# Patient Record
Sex: Female | Born: 1950
Health system: Southern US, Community
[De-identification: ages and names within clinical notes are randomized; demographics above are authoritative.]

## PROBLEM LIST (undated history)

## (undated) DIAGNOSIS — B029 Zoster without complications: Secondary | ICD-10-CM

## (undated) DIAGNOSIS — K219 Gastro-esophageal reflux disease without esophagitis: Secondary | ICD-10-CM

## (undated) DIAGNOSIS — K648 Other hemorrhoids: Secondary | ICD-10-CM

## (undated) DIAGNOSIS — N9489 Other specified conditions associated with female genital organs and menstrual cycle: Secondary | ICD-10-CM

## (undated) DIAGNOSIS — E7800 Pure hypercholesterolemia, unspecified: Secondary | ICD-10-CM

## (undated) DIAGNOSIS — C649 Malignant neoplasm of unspecified kidney, except renal pelvis: Secondary | ICD-10-CM

## (undated) DIAGNOSIS — I10 Essential (primary) hypertension: Secondary | ICD-10-CM

## (undated) HISTORY — PX: LIPECTOMY: SHX11

---

## 1997-09-21 ENCOUNTER — Ambulatory Visit (HOSPITAL_COMMUNITY): Admission: RE | Admit: 1997-09-21 | Discharge: 1997-09-21 | Payer: Self-pay | Admitting: *Deleted

## 1998-05-28 ENCOUNTER — Other Ambulatory Visit: Admission: RE | Admit: 1998-05-28 | Discharge: 1998-05-28 | Payer: Self-pay | Admitting: Obstetrics and Gynecology

## 1998-07-06 ENCOUNTER — Other Ambulatory Visit: Admission: RE | Admit: 1998-07-06 | Discharge: 1998-07-06 | Payer: Self-pay | Admitting: Obstetrics and Gynecology

## 1999-07-15 ENCOUNTER — Encounter: Admission: RE | Admit: 1999-07-15 | Discharge: 1999-07-15 | Payer: Self-pay | Admitting: Obstetrics and Gynecology

## 1999-07-15 ENCOUNTER — Encounter: Payer: Self-pay | Admitting: Obstetrics and Gynecology

## 1999-07-19 ENCOUNTER — Other Ambulatory Visit: Admission: RE | Admit: 1999-07-19 | Discharge: 1999-07-19 | Payer: Self-pay | Admitting: Obstetrics and Gynecology

## 2000-07-17 ENCOUNTER — Encounter: Payer: Self-pay | Admitting: Obstetrics and Gynecology

## 2000-07-17 ENCOUNTER — Encounter: Admission: RE | Admit: 2000-07-17 | Discharge: 2000-07-17 | Payer: Self-pay | Admitting: Obstetrics and Gynecology

## 2000-08-19 ENCOUNTER — Other Ambulatory Visit: Admission: RE | Admit: 2000-08-19 | Discharge: 2000-08-19 | Payer: Self-pay | Admitting: Obstetrics and Gynecology

## 2001-01-25 ENCOUNTER — Ambulatory Visit (HOSPITAL_COMMUNITY): Admission: RE | Admit: 2001-01-25 | Discharge: 2001-01-25 | Payer: Self-pay | Admitting: *Deleted

## 2002-12-21 ENCOUNTER — Ambulatory Visit (HOSPITAL_COMMUNITY): Admission: RE | Admit: 2002-12-21 | Discharge: 2002-12-21 | Payer: Self-pay | Admitting: Endocrinology

## 2003-01-09 ENCOUNTER — Encounter: Admission: RE | Admit: 2003-01-09 | Discharge: 2003-01-09 | Payer: Self-pay | Admitting: Obstetrics and Gynecology

## 2003-02-14 ENCOUNTER — Other Ambulatory Visit: Admission: RE | Admit: 2003-02-14 | Discharge: 2003-02-14 | Payer: Self-pay | Admitting: Obstetrics and Gynecology

## 2003-12-12 ENCOUNTER — Ambulatory Visit: Payer: Self-pay | Admitting: Cardiology

## 2003-12-13 ENCOUNTER — Encounter: Payer: Self-pay | Admitting: Cardiology

## 2003-12-13 ENCOUNTER — Ambulatory Visit (HOSPITAL_COMMUNITY): Admission: RE | Admit: 2003-12-13 | Discharge: 2003-12-13 | Payer: Self-pay | Admitting: Neurology

## 2004-04-10 ENCOUNTER — Emergency Department (HOSPITAL_COMMUNITY): Admission: EM | Admit: 2004-04-10 | Discharge: 2004-04-10 | Payer: Self-pay | Admitting: Emergency Medicine

## 2004-10-28 ENCOUNTER — Encounter: Admission: RE | Admit: 2004-10-28 | Discharge: 2004-10-28 | Payer: Self-pay | Admitting: Surgery

## 2004-11-15 ENCOUNTER — Encounter: Admission: RE | Admit: 2004-11-15 | Discharge: 2004-11-15 | Payer: Self-pay | Admitting: Internal Medicine

## 2005-03-31 ENCOUNTER — Encounter: Admission: RE | Admit: 2005-03-31 | Discharge: 2005-03-31 | Payer: Self-pay | Admitting: Obstetrics and Gynecology

## 2005-05-16 ENCOUNTER — Encounter: Admission: RE | Admit: 2005-05-16 | Discharge: 2005-05-16 | Payer: Self-pay | Admitting: Internal Medicine

## 2005-07-01 ENCOUNTER — Ambulatory Visit (HOSPITAL_COMMUNITY): Admission: RE | Admit: 2005-07-01 | Discharge: 2005-07-01 | Payer: Self-pay | Admitting: Obstetrics and Gynecology

## 2005-07-01 ENCOUNTER — Encounter (INDEPENDENT_AMBULATORY_CARE_PROVIDER_SITE_OTHER): Payer: Self-pay | Admitting: Specialist

## 2005-07-01 HISTORY — PX: OTHER SURGICAL HISTORY: SHX169

## 2006-04-03 ENCOUNTER — Encounter: Admission: RE | Admit: 2006-04-03 | Discharge: 2006-04-03 | Payer: Self-pay | Admitting: Obstetrics and Gynecology

## 2006-06-24 ENCOUNTER — Encounter: Admission: RE | Admit: 2006-06-24 | Discharge: 2006-06-24 | Payer: Self-pay | Admitting: Internal Medicine

## 2007-05-24 ENCOUNTER — Encounter: Admission: RE | Admit: 2007-05-24 | Discharge: 2007-05-24 | Payer: Self-pay | Admitting: Obstetrics and Gynecology

## 2008-01-14 ENCOUNTER — Encounter: Admission: RE | Admit: 2008-01-14 | Discharge: 2008-01-14 | Payer: Self-pay | Admitting: Internal Medicine

## 2008-06-20 ENCOUNTER — Encounter: Admission: RE | Admit: 2008-06-20 | Discharge: 2008-06-20 | Payer: Self-pay | Admitting: Obstetrics and Gynecology

## 2008-10-25 ENCOUNTER — Encounter: Admission: RE | Admit: 2008-10-25 | Discharge: 2008-10-25 | Payer: Self-pay | Admitting: Internal Medicine

## 2008-11-13 ENCOUNTER — Ambulatory Visit (HOSPITAL_COMMUNITY): Admission: RE | Admit: 2008-11-13 | Discharge: 2008-11-14 | Payer: Self-pay | Admitting: General Surgery

## 2008-11-13 ENCOUNTER — Encounter (INDEPENDENT_AMBULATORY_CARE_PROVIDER_SITE_OTHER): Payer: Self-pay | Admitting: General Surgery

## 2008-11-13 HISTORY — PX: CHOLECYSTECTOMY: SHX55

## 2009-03-26 ENCOUNTER — Encounter: Admission: RE | Admit: 2009-03-26 | Discharge: 2009-03-26 | Payer: Self-pay | Admitting: Internal Medicine

## 2009-04-24 ENCOUNTER — Ambulatory Visit (HOSPITAL_COMMUNITY): Admission: RE | Admit: 2009-04-24 | Discharge: 2009-04-24 | Payer: Self-pay | Admitting: Urology

## 2009-05-01 ENCOUNTER — Ambulatory Visit (HOSPITAL_COMMUNITY): Admission: RE | Admit: 2009-05-01 | Discharge: 2009-05-01 | Payer: Self-pay | Admitting: Urology

## 2009-05-14 ENCOUNTER — Inpatient Hospital Stay (HOSPITAL_COMMUNITY): Admission: RE | Admit: 2009-05-14 | Discharge: 2009-05-16 | Payer: Self-pay | Admitting: Urology

## 2009-05-14 ENCOUNTER — Encounter (INDEPENDENT_AMBULATORY_CARE_PROVIDER_SITE_OTHER): Payer: Self-pay | Admitting: Urology

## 2009-05-14 DIAGNOSIS — C649 Malignant neoplasm of unspecified kidney, except renal pelvis: Secondary | ICD-10-CM

## 2009-05-14 HISTORY — PX: CYSTOSCOPY KIDNEY W/ URETERAL GUIDE WIRE: SUR371

## 2009-05-14 HISTORY — DX: Malignant neoplasm of unspecified kidney, except renal pelvis: C64.9

## 2009-07-23 ENCOUNTER — Encounter: Admission: RE | Admit: 2009-07-23 | Discharge: 2009-07-23 | Payer: Self-pay | Admitting: Obstetrics and Gynecology

## 2010-02-17 ENCOUNTER — Encounter: Payer: Self-pay | Admitting: Obstetrics and Gynecology

## 2010-02-18 ENCOUNTER — Encounter: Payer: Self-pay | Admitting: Internal Medicine

## 2010-02-22 ENCOUNTER — Ambulatory Visit (HOSPITAL_COMMUNITY)
Admission: RE | Admit: 2010-02-22 | Discharge: 2010-02-22 | Payer: Self-pay | Source: Home / Self Care | Attending: Urology | Admitting: Urology

## 2010-04-16 LAB — CBC
HCT: 33.1 % — ABNORMAL LOW (ref 36.0–46.0)
MCV: 87.1 fL (ref 78.0–100.0)
RBC: 3.81 MIL/uL — ABNORMAL LOW (ref 3.87–5.11)
WBC: 6.6 10*3/uL (ref 4.0–10.5)

## 2010-04-16 LAB — BASIC METABOLIC PANEL
BUN: 13 mg/dL (ref 6–23)
CO2: 23 mEq/L (ref 19–32)
CO2: 26 mEq/L (ref 19–32)
CO2: 28 mEq/L (ref 19–32)
Calcium: 9.2 mg/dL (ref 8.4–10.5)
Calcium: 9.7 mg/dL (ref 8.4–10.5)
Chloride: 103 mEq/L (ref 96–112)
Chloride: 103 mEq/L (ref 96–112)
Chloride: 107 mEq/L (ref 96–112)
Creatinine, Ser: 1.01 mg/dL (ref 0.4–1.2)
Creatinine, Ser: 1.07 mg/dL (ref 0.4–1.2)
GFR calc Af Amer: >60 mL/min (ref >60)
GFR calc Af Amer: >60 mL/min (ref >60)
GFR calc non Af Amer: 56 mL/min — ABNORMAL LOW (ref >60)
Glucose, Bld: 119 mg/dL — ABNORMAL HIGH (ref 70–99)
Glucose, Bld: 224 mg/dL — ABNORMAL HIGH (ref 70–99)
Potassium: 4.1 mEq/L (ref 3.5–5.1)
Potassium: 4.4 mEq/L (ref 3.5–5.1)
Potassium: 4.4 mEq/L (ref 3.5–5.1)
Sodium: 135 mEq/L (ref 135–145)
Sodium: 135 mEq/L (ref 135–145)
Sodium: 140 mEq/L (ref 135–145)

## 2010-04-16 LAB — GLUCOSE, CAPILLARY
Glucose-Capillary: 180 mg/dL — ABNORMAL HIGH (ref 70–99)
Glucose-Capillary: 184 mg/dL — ABNORMAL HIGH (ref 70–99)
Glucose-Capillary: 192 mg/dL — ABNORMAL HIGH (ref 70–99)
Glucose-Capillary: 200 mg/dL — ABNORMAL HIGH (ref 70–99)
Glucose-Capillary: 227 mg/dL — ABNORMAL HIGH (ref 70–99)
Glucose-Capillary: 311 mg/dL — ABNORMAL HIGH (ref 70–99)

## 2010-04-16 LAB — HEMOGLOBIN AND HEMATOCRIT, BLOOD
HCT: 29.6 % — ABNORMAL LOW (ref 36.0–46.0)
HCT: 29.9 % — ABNORMAL LOW (ref 36.0–46.0)
Hemoglobin: 10 g/dL — ABNORMAL LOW (ref 12.0–15.0)
Hemoglobin: 9.7 g/dL — ABNORMAL LOW (ref 12.0–15.0)

## 2010-04-16 LAB — CREATININE, FLUID (PLEURAL, PERITONEAL, JP DRAINAGE)

## 2010-05-02 LAB — BASIC METABOLIC PANEL
BUN: 22 mg/dL (ref 6–23)
CO2: 27 mEq/L (ref 19–32)
Chloride: 100 mEq/L (ref 96–112)
Creatinine, Ser: 0.95 mg/dL (ref 0.4–1.2)

## 2010-05-02 LAB — CBC
MCHC: 33.7 g/dL (ref 30.0–36.0)
MCV: 85 fL (ref 78.0–100.0)
Platelets: 328 10*3/uL (ref 150–400)

## 2010-05-02 LAB — GLUCOSE, CAPILLARY
Glucose-Capillary: 123 mg/dL — ABNORMAL HIGH (ref 70–99)
Glucose-Capillary: 126 mg/dL — ABNORMAL HIGH (ref 70–99)
Glucose-Capillary: 161 mg/dL — ABNORMAL HIGH (ref 70–99)
Glucose-Capillary: 247 mg/dL — ABNORMAL HIGH (ref 70–99)

## 2010-06-14 NOTE — Op Note (Signed)
[facility]  Patient:    Briana Garrison, Briana Garrison Visit Number: [id number] MRN: [id number]          Service Type: END Location: ENDO Attending Physician:  Jim Desanctis Dictated by:   Jim Desanctis, M.D. Proc. Date: 01/25/01 Admit Date:  01/25/2001                             Operative Report  PROCEDURE:  Colonoscopy.  INDICATION:  Rectal bleeding.  ANESTHESIA:  Demerol 60 mg, Versed 6 mg IV.  DESCRIPTION OF PROCEDURE:  With the patient mildly sedated in the left lateral decubitus position, the Olympus videoscopic colonoscope was inserted in the rectum, passed under direct vision to the cecum, identified by ileocecal valve and appendiceal orifice. From this point, the colonoscope was slowly withdrawn taking circumferential views of the entire colonic mucosa stopping in the rectum which appeared normal in direct view, showed hemorrhoids in retroflex view. The endoscope was straightened and withdrawn. The patients vital signs, pulse oximeter remained stable. The patient tolerated the procedure well without apparent complications.  FINDINGS:  Internal hemorrhoids, otherwise unremarkable examination.  PLAN:  Repeat examination in five to ten years. Dictated by:   Jim Desanctis, M.D. Attending Physician:  Jim Desanctis DD:  01/25/01 TD:  01/25/01 Job: [id number]

## 2010-06-14 NOTE — Op Note (Signed)
NAMEOLOLADE, LAZARRE               ACCOUNT NO.:  [id number]   MEDICAL RECORD NO.:  [id number]          PATIENT TYPE:  AMB   LOCATION:  [place]                           FACILITY:  [facility]   PHYSICIAN:  Servando Salina, M.D.DATE OF BIRTH:  03/09/1950   DATE OF PROCEDURE:  DATE OF DISCHARGE:                                 OPERATIVE REPORT   PREOPERATIVE DIAGNOSIS:  Endometrial mass.   PROCEDURES:  Diagnostic hysteroscopy, D&C, endometrial polypectomy.   POSTOPERATIVE DIAGNOSIS:  Endometrial mass pending final pathology.   ANESTHESIA:  General paracervical block.   SURGEON:  Servando Salina, M.D.   PROCEDURE:  Under adequate general anesthesia, the patient is placed in the  dorsal lithotomy position.  She was sterilely prepped and draped in usual  fashion.  Bladder was catheterized of moderate amount of urine.  Examination  under anesthesia revealed an axial to anteverted uterus, no adnexal masses  could be appreciated.  A bivalve speculum was placed in the vagina.  Atrophic vaginal mucosa was noted.  10 mL of 1% nesacaine was injected  paracervically at 3 and 9 o'clock.  The anterior lip of the cervix was  grasped with a single-tooth tenaculum.  The cervix was then serially dilated  to #25 [facility] dilator.  A diagnostic hysteroscope was introduced into uterine  cavity. Polypoid lesions noted off the posterior wall.  Both tubal ostia  could be seen.  No masses in the endocervical canal noted.  The hysteroscope  was removed.  A polyp forcep was then used to grab the polyp which was then  removed.  The hysteroscope was then reinserted, confirmed that the polyp was  removed in its entirety.  The cavity was then curetted for scant amount of  tissue.  The hysteroscope was then reinserted in the uterus for further  inspection, no other lesions were noted, at which time all instruments were  then removed from the vagina.  Specimen labeled endometrial curettings and  endometrial polyp were  sent to pathology.  Estimated blood loss was minimal.  Fluid deficit was 75 mL.  Complication was none.  The patient tolerated the  procedure well, was transferred to recovery in stable condition.      Servando Salina, M.D.  Electronically Signed     [place]/MEDQ  D:  07/01/2005  T:  07/02/2005  Job:  [id number]

## 2010-06-21 ENCOUNTER — Other Ambulatory Visit: Payer: Self-pay | Admitting: Obstetrics and Gynecology

## 2010-06-21 DIAGNOSIS — Z1231 Encounter for screening mammogram for malignant neoplasm of breast: Secondary | ICD-10-CM

## 2010-07-04 ENCOUNTER — Other Ambulatory Visit (HOSPITAL_COMMUNITY): Payer: Self-pay | Admitting: Urology

## 2010-07-04 ENCOUNTER — Ambulatory Visit (HOSPITAL_COMMUNITY)
Admission: RE | Admit: 2010-07-04 | Discharge: 2010-07-04 | Disposition: A | Discharge status: Home / Self Care | Payer: BC Managed Care – PPO | Source: Ambulatory Visit | Attending: Urology | Admitting: Urology

## 2010-07-04 DIAGNOSIS — Z85528 Personal history of other malignant neoplasm of kidney: Secondary | ICD-10-CM | POA: Insufficient documentation

## 2010-07-04 DIAGNOSIS — I10 Essential (primary) hypertension: Secondary | ICD-10-CM | POA: Insufficient documentation

## 2010-07-25 ENCOUNTER — Ambulatory Visit: Payer: Self-pay

## 2010-07-26 ENCOUNTER — Ambulatory Visit
Admission: RE | Admit: 2010-07-26 | Discharge: 2010-07-26 | Disposition: A | Discharge status: Home / Self Care | Payer: BC Managed Care – PPO | Source: Ambulatory Visit | Attending: Obstetrics and Gynecology | Admitting: Obstetrics and Gynecology

## 2010-07-26 DIAGNOSIS — Z1231 Encounter for screening mammogram for malignant neoplasm of breast: Secondary | ICD-10-CM

## 2011-05-12 ENCOUNTER — Other Ambulatory Visit: Payer: Self-pay | Admitting: Obstetrics and Gynecology

## 2011-05-12 DIAGNOSIS — N644 Mastodynia: Secondary | ICD-10-CM

## 2011-05-13 ENCOUNTER — Ambulatory Visit
Admission: RE | Admit: 2011-05-13 | Discharge: 2011-05-13 | Disposition: A | Discharge status: Home / Self Care | Payer: BC Managed Care – PPO | Source: Ambulatory Visit | Attending: Obstetrics and Gynecology | Admitting: Obstetrics and Gynecology

## 2011-05-13 DIAGNOSIS — N644 Mastodynia: Secondary | ICD-10-CM

## 2011-06-19 ENCOUNTER — Other Ambulatory Visit: Payer: Self-pay | Admitting: Internal Medicine

## 2011-06-19 DIAGNOSIS — Z1231 Encounter for screening mammogram for malignant neoplasm of breast: Secondary | ICD-10-CM

## 2011-06-23 ENCOUNTER — Encounter (HOSPITAL_COMMUNITY): Payer: Self-pay

## 2011-06-23 ENCOUNTER — Inpatient Hospital Stay (HOSPITAL_COMMUNITY)
Admission: EM | Admit: 2011-06-23 | Discharge: 2011-06-28 | DRG: 316 | Disposition: A | Discharge status: Home / Self Care | Payer: BC Managed Care – PPO | Attending: Internal Medicine | Admitting: Internal Medicine

## 2011-06-23 DIAGNOSIS — N179 Acute kidney failure, unspecified: Secondary | ICD-10-CM

## 2011-06-23 DIAGNOSIS — N189 Chronic kidney disease, unspecified: Secondary | ICD-10-CM | POA: Diagnosis present

## 2011-06-23 DIAGNOSIS — R410 Disorientation, unspecified: Secondary | ICD-10-CM

## 2011-06-23 DIAGNOSIS — R4182 Altered mental status, unspecified: Secondary | ICD-10-CM | POA: Diagnosis present

## 2011-06-23 DIAGNOSIS — Z794 Long term (current) use of insulin: Secondary | ICD-10-CM

## 2011-06-23 DIAGNOSIS — E860 Dehydration: Secondary | ICD-10-CM

## 2011-06-23 DIAGNOSIS — K219 Gastro-esophageal reflux disease without esophagitis: Secondary | ICD-10-CM | POA: Diagnosis present

## 2011-06-23 DIAGNOSIS — E1169 Type 2 diabetes mellitus with other specified complication: Secondary | ICD-10-CM

## 2011-06-23 DIAGNOSIS — R1110 Vomiting, unspecified: Secondary | ICD-10-CM | POA: Diagnosis present

## 2011-06-23 DIAGNOSIS — N390 Urinary tract infection, site not specified: Secondary | ICD-10-CM | POA: Diagnosis present

## 2011-06-23 DIAGNOSIS — R1084 Generalized abdominal pain: Secondary | ICD-10-CM

## 2011-06-23 DIAGNOSIS — E119 Type 2 diabetes mellitus without complications: Secondary | ICD-10-CM | POA: Diagnosis present

## 2011-06-23 DIAGNOSIS — D649 Anemia, unspecified: Secondary | ICD-10-CM | POA: Diagnosis present

## 2011-06-23 DIAGNOSIS — E11649 Type 2 diabetes mellitus with hypoglycemia without coma: Secondary | ICD-10-CM | POA: Diagnosis present

## 2011-06-23 DIAGNOSIS — I10 Essential (primary) hypertension: Secondary | ICD-10-CM | POA: Diagnosis present

## 2011-06-23 DIAGNOSIS — R5381 Other malaise: Secondary | ICD-10-CM | POA: Diagnosis present

## 2011-06-23 DIAGNOSIS — E7800 Pure hypercholesterolemia, unspecified: Secondary | ICD-10-CM | POA: Diagnosis present

## 2011-06-23 DIAGNOSIS — E785 Hyperlipidemia, unspecified: Secondary | ICD-10-CM | POA: Diagnosis present

## 2011-06-23 DIAGNOSIS — E162 Hypoglycemia, unspecified: Secondary | ICD-10-CM

## 2011-06-23 DIAGNOSIS — E782 Mixed hyperlipidemia: Secondary | ICD-10-CM

## 2011-06-23 DIAGNOSIS — N289 Disorder of kidney and ureter, unspecified: Secondary | ICD-10-CM

## 2011-06-23 HISTORY — DX: Gastro-esophageal reflux disease without esophagitis: K21.9

## 2011-06-23 HISTORY — DX: Zoster without complications: B02.9

## 2011-06-23 HISTORY — DX: Other hemorrhoids: K64.8

## 2011-06-23 HISTORY — DX: Other specified conditions associated with female genital organs and menstrual cycle: N94.89

## 2011-06-23 HISTORY — DX: Essential (primary) hypertension: I10

## 2011-06-23 HISTORY — DX: Malignant neoplasm of unspecified kidney, except renal pelvis: C64.9

## 2011-06-23 HISTORY — DX: Pure hypercholesterolemia, unspecified: E78.00

## 2011-06-23 LAB — HEPATIC FUNCTION PANEL
ALT: 31 U/L (ref 0–35)
AST: 23 U/L (ref 0–37)
Alkaline Phosphatase: 63 U/L (ref 39–117)
Bilirubin, Direct: <0.1 mg/dL (ref 0.0–0.3)

## 2011-06-23 LAB — HEMOGLOBIN A1C
Hgb A1c MFr Bld: 8.8 % — ABNORMAL HIGH (ref <5.7)
Mean Plasma Glucose: 206 mg/dL — ABNORMAL HIGH (ref <117)

## 2011-06-23 LAB — URINALYSIS, ROUTINE W REFLEX MICROSCOPIC
Glucose, UA: NEGATIVE mg/dL
Ketones, ur: NEGATIVE mg/dL
Protein, ur: 30 mg/dL — AB
Urobilinogen, UA: 0.2 mg/dL (ref 0.0–1.0)

## 2011-06-23 LAB — BASIC METABOLIC PANEL
CO2: 25 mEq/L (ref 19–32)
Calcium: 9.5 mg/dL (ref 8.4–10.5)
Creatinine, Ser: 2.74 mg/dL — ABNORMAL HIGH (ref 0.50–1.10)
GFR calc non Af Amer: 18 mL/min — ABNORMAL LOW (ref >90)
Glucose, Bld: 54 mg/dL — ABNORMAL LOW (ref 70–99)

## 2011-06-23 LAB — CBC
MCH: 28.4 pg (ref 26.0–34.0)
MCV: 85.5 fL (ref 78.0–100.0)
Platelets: 246 10*3/uL (ref 150–400)
RDW: 14.1 % (ref 11.5–15.5)
WBC: 8.9 10*3/uL (ref 4.0–10.5)

## 2011-06-23 LAB — DIFFERENTIAL
Basophils Absolute: 0 10*3/uL (ref 0.0–0.1)
Eosinophils Absolute: 0.2 10*3/uL (ref 0.0–0.7)
Eosinophils Relative: 2 % (ref 0–5)

## 2011-06-23 LAB — GLUCOSE, CAPILLARY
Glucose-Capillary: 179 mg/dL — ABNORMAL HIGH (ref 70–99)
Glucose-Capillary: 274 mg/dL — ABNORMAL HIGH (ref 70–99)
Glucose-Capillary: 58 mg/dL — ABNORMAL LOW (ref 70–99)
Glucose-Capillary: 78 mg/dL (ref 70–99)
Glucose-Capillary: 93 mg/dL (ref 70–99)

## 2011-06-23 LAB — URINE MICROSCOPIC-ADD ON

## 2011-06-23 MED ORDER — INSULIN ASPART 100 UNIT/ML ~~LOC~~ SOLN
0.0000 [IU] | Freq: Three times a day (TID) | SUBCUTANEOUS | Status: DC
  Administered 2011-06-24: 2 [IU] via SUBCUTANEOUS
  Administered 2011-06-24: 1 [IU] via SUBCUTANEOUS
  Administered 2011-06-24: 2 [IU] via SUBCUTANEOUS
  Administered 2011-06-25: 4 [IU] via SUBCUTANEOUS

## 2011-06-23 MED ORDER — ACETAMINOPHEN 650 MG RE SUPP: 650.0000 mg | Freq: Four times a day (QID) | RECTAL | Status: DC | PRN

## 2011-06-23 MED ORDER — SODIUM CHLORIDE 0.9 % IV BOLUS (SEPSIS)
1000.0000 mL | Freq: Once | INTRAVENOUS | Status: AC
  Administered 2011-06-23: 1000 mL via INTRAVENOUS

## 2011-06-23 MED ORDER — VITAMIN B-12 1000 MCG PO TABS
500.0000 ug | ORAL_TABLET | Freq: Every day | ORAL | Status: DC
  Administered 2011-06-23 – 2011-06-28 (×6): 500 ug via ORAL
  Filled 2011-06-23 (×6): qty 0.5

## 2011-06-23 MED ORDER — ONDANSETRON HCL 4 MG/2ML IJ SOLN
4.0000 mg | Freq: Four times a day (QID) | INTRAMUSCULAR | Status: DC | PRN
  Administered 2011-06-24: 4 mg via INTRAVENOUS
  Filled 2011-06-23: qty 2

## 2011-06-23 MED ORDER — ASPIRIN 325 MG PO TABS
325.0000 mg | ORAL_TABLET | Freq: Every day | ORAL | Status: DC
  Administered 2011-06-23 – 2011-06-28 (×6): 325 mg via ORAL
  Filled 2011-06-23 (×6): qty 1

## 2011-06-23 MED ORDER — DEXTROSE-NACL 5-0.9 % IV SOLN
INTRAVENOUS | Status: DC
  Administered 2011-06-23 – 2011-06-24 (×3): via INTRAVENOUS

## 2011-06-23 MED ORDER — OXYCODONE-ACETAMINOPHEN 5-325 MG PO TABS
1.0000 | ORAL_TABLET | ORAL | Status: DC | PRN
  Administered 2011-06-23 – 2011-06-27 (×5): 1 via ORAL
  Filled 2011-06-23 (×5): qty 1

## 2011-06-23 MED ORDER — INSULIN ASPART 100 UNIT/ML ~~LOC~~ SOLN: 0.0000 [IU] | Freq: Every day | SUBCUTANEOUS | Status: DC

## 2011-06-23 MED ORDER — SODIUM CHLORIDE 0.9 % IV SOLN
INTRAVENOUS | Status: DC
  Administered 2011-06-23: 15:00:00 via INTRAVENOUS

## 2011-06-23 MED ORDER — VITAMIN D3 25 MCG (1000 UNIT) PO TABS
2000.0000 [IU] | ORAL_TABLET | Freq: Every day | ORAL | Status: DC
  Administered 2011-06-23 – 2011-06-28 (×6): 2000 [IU] via ORAL
  Filled 2011-06-23 (×6): qty 2

## 2011-06-23 MED ORDER — POLYETHYLENE GLYCOL 3350 17 G PO PACK
17.0000 g | PACK | Freq: Every day | ORAL | Status: DC | PRN
  Administered 2011-06-25 – 2011-06-26 (×2): 17 g via ORAL
  Filled 2011-06-23 (×2): qty 1

## 2011-06-23 MED ORDER — VITAMIN C 500 MG PO TABS
500.0000 mg | ORAL_TABLET | Freq: Every day | ORAL | Status: DC
  Administered 2011-06-23 – 2011-06-28 (×6): 500 mg via ORAL
  Filled 2011-06-23 (×6): qty 1

## 2011-06-23 MED ORDER — ONDANSETRON HCL 4 MG PO TABS: 4.0000 mg | ORAL_TABLET | Freq: Four times a day (QID) | ORAL | Status: DC | PRN

## 2011-06-23 MED ORDER — DEXTROSE 5 % IV SOLN
1.0000 g | INTRAVENOUS | Status: DC
  Administered 2011-06-23 – 2011-06-25 (×3): 1 g via INTRAVENOUS
  Filled 2011-06-23 (×3): qty 10

## 2011-06-23 MED ORDER — SODIUM CHLORIDE 0.9 % IV SOLN: INTRAVENOUS | Status: DC

## 2011-06-23 MED ORDER — ACETAMINOPHEN 325 MG PO TABS: 650.0000 mg | ORAL_TABLET | Freq: Four times a day (QID) | ORAL | Status: DC | PRN

## 2011-06-23 MED ORDER — LORATADINE 10 MG PO TABS
10.0000 mg | ORAL_TABLET | Freq: Every day | ORAL | Status: DC
  Administered 2011-06-23 – 2011-06-28 (×6): 10 mg via ORAL
  Filled 2011-06-23 (×6): qty 1

## 2011-06-23 NOTE — ED Notes (Signed)
Report called to [place] on [address]

## 2011-06-23 NOTE — ED Notes (Signed)
Attempted to call report 3 waiting for call back.

## 2011-06-23 NOTE — ED Notes (Signed)
After eating pts CBG was greater than 90.

## 2011-06-23 NOTE — H&P (Signed)
Hospital Admission Note Date: 06/23/2011  Patient name: Briana Garrison Medical record number: [id number] Date of birth: [birth year] Age: 61 y.o. Gender: female PCP: Horatio Pel, MD, MD  Attending physician: Venetia Maxon Canyon Lohr, MD Emergency Contact:  Carmelina Noun [phone] Code Status:  Full  Chief Complaint: "Abdominal pain".  History of Present Illness: Briana Garrison is an 61 y.o. female with PMH of DM who presents to the hospital with a chief complaint of lower back and lower abdominal pain over the past 2 days.  Her symptoms were associated with progressive weakness and altered mental status (mainly complaining of memory impairment).  Has had some chills but no fever.  No shortness of breath or cough.  Poor appetite with some nausea and vomiting, but no diarrhea.  No melena or hematochezia.  Upon initial evaluation in the ER, the patient was noted to have hypoglycemia and acute renal failure.  Past Medical History Past Medical History  Diagnosis Date  . Diabetes mellitus   . Hypertension   . Cancer of kidney 05/14/2009  . Internal hemorrhoids     S/P colonoscopy 06/10/00 by Dr. Lajoyce Corners  . Endometrial mass   . GERD (gastroesophageal reflux disease)   . Shingles   . Hypercholesterolemia     Past Surgical History Past Surgical History  Procedure Date  . Cholecystectomy 11/13/08  . Cystoscopy kidney w/ ureteral guide wire 05/14/09  . Diagnostic hysteroscopy, d&c, endometrial polypectomy 07/01/2005  . Lipectomy     Meds: Prior to Admission medications   Medication Sig Start Date End Date Taking? Authorizing Provider  amoxicillin (AMOXIL) 500 MG capsule Take 500 mg by mouth 3 (three) times daily. 06/16/11  Yes Historical Provider, MD  Ascorbic Acid (VITAMIN C WITH ROSE HIPS) 500 MG tablet Take 500 mg by mouth daily.   Yes Historical Provider, MD  aspirin 325 MG tablet Take 325 mg by mouth daily.   Yes Historical Provider, MD  Azilsartan-Chlorthalidone (EDARBYCLOR) 40-25  MG TABS Take 1 tablet by mouth daily. Patient takes 1-2 tablets by mouth every 4 to 6 hours as needed for pain   Yes Historical Provider, MD  cholecalciferol (VITAMIN D) 1000 UNITS tablet Take 2,000 Units by mouth daily.   Yes Historical Provider, MD  cloNIDine (CATAPRES) 0.1 MG tablet Take 0.1 mg by mouth daily.   Yes Historical Provider, MD  Exenatide (BYDUREON) 2 MG SUSR Inject 2 mg into the skin once a week. On Thursday   Yes Historical Provider, MD  guaiFENesin (MUCINEX) 600 MG 12 hr tablet Take 1,200 mg by mouth 2 (two) times daily.   Yes Historical Provider, MD  Homeopathic Products [facility] DRY EYE RELIEF) SOLN Apply 1 drop to eye every 12 (twelve) hours as needed. For allergy eyes   Yes Historical Provider, MD  Homeopathic Products [facility] EARACHE RELIEF OT) Place 1 drop in ear(s) daily. For left ear   Yes Historical Provider, MD  HYDROcodone-acetaminophen (VICODIN) 5-500 MG per tablet Take 1-2 tablets by mouth every 4 (four) hours as needed.   Yes Historical Provider, MD  ibuprofen (ADVIL,MOTRIN) 200 MG tablet Take 400 mg by mouth every 6 (six) hours as needed. For pain   Yes Historical Provider, MD  insulin glargine (LANTUS) 100 UNIT/ML injection Inject 64 Units into the skin at bedtime.   Yes Historical Provider, MD  loratadine (CLARITIN) 10 MG tablet Take 10 mg by mouth daily.   Yes Historical Provider, MD  menthol-cetylpyridinium (CEPACOL) 3 MG lozenge Take 1 lozenge by mouth as needed.  Yes Historical Provider, MD  metFORMIN (GLUCOPHAGE) 1000 MG tablet Take 1,000 mg by mouth 2 (two) times daily with a meal.   Yes Historical Provider, MD  pseudoephedrine (SUDAFED) 120 MG 12 hr tablet Take 120 mg by mouth every 12 (twelve) hours.   Yes Historical Provider, MD  rosuvastatin (CRESTOR) 20 MG tablet Take 20 mg by mouth daily.   Yes Historical Provider, MD  vitamin B-12 (CYANOCOBALAMIN) 500 MCG tablet Take 500 mcg by mouth daily.   Yes Historical Provider, MD    Allergies: Review of  patient's allergies indicates no known allergies.  Social History: History   Social History  . Marital Status: Married    Spouse Name: [name]    Number of Children: 2  . Years of Education: 12   Occupational History  . 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 Smoking status: Never Smoker   . Smokeless tobacco: Not on file  . Alcohol Use: No  . Drug Use: No  . Sexually Active:    Other Topics Concern  . Not on file   Social History Narrative   Married.  Lives with husband.  Normally ambulates without assistance.    Family History:  Family History  Problem Relation Age of Onset  . Ovarian cancer Mother   . Multiple sclerosis Sister   . Stroke Mother   . Stroke Father   . Stroke Brother     Review of Systems: Constitutional: No fever, +chills;  Appetite diminished; +weight loss of 1-2 pounds, no weight gain.  HEENT: No blurry vision, no diplopia, no pharyngitis, no dysphagia, recent ear infection left for which she was put on Amoxicillin CV: No chest pain, no palpitations.  Resp: No SOB, no cough. GI: + nausea, +vomiting, no diarrhea, no melena, no hematochezia.  GU: No dysuria, no hematuria.  MSK: + lower back myalgias, no arthralgias.  Neuro:  No headache, no focal neurological deficits, no history of seizures.  Psych: No depression, no anxiety.  Endo: No thyroid disease, + DM, no heat intolerance, no cold intolerance, no polyuria, no polydipsia  Skin: No rashes, no skin lesions.  Heme: No easy bruising, no history of blood diseases.   Physical Exam: Blood pressure 131/77, pulse 88, temperature 98.3 F (36.8 C), temperature source Oral, resp. rate 14, SpO2 100.00%. BP 131/77  Pulse 88  Temp(Src) 98.3 F (36.8 C) (Oral)  Resp 14  SpO2 100%  General Appearance:    Alert, cooperative, no distress, appears stated age  Head:    Normocephalic, without obvious abnormality, atraumatic  Eyes:    PERRL, conjunctiva/corneas  clear, EOM's intact  Ears:    Normal external ear canals, both ears  Nose:   Nares normal, septum midline, mucosa normal, no drainage    or sinus tenderness  Throat:   Lips, mucosa, and tongue normal; teeth and gums normal  Neck:   Supple, symmetrical, trachea midline, no adenopathy;    thyroid:  no enlargement/tenderness/nodules; no carotid   bruit or JVD  Back:     Symmetric, no curvature, ROM normal, no CVA tenderness  Lungs:     Clear to auscultation bilaterally, respirations unlabored  Chest Wall:    No tenderness or deformity   Heart:    Regular rate and rhythm, S1 and S2 normal, no murmur, rub   or gallop  Abdomen:     Soft, mildly tender lower abdomen, bowel sounds active all four quadrants, no masses, no organomegaly  Extremities:   Extremities normal, atraumatic, no cyanosis or edema  Pulses:   2+ and symmetric all extremities  Skin:   Skin color, texture, turgor normal, no rashes or lesions  Lymph nodes:   Cervical, supraclavicular, and axillary nodes normal  Neurologic:   CNII-XII intact, normal strength.   Lab results: Basic Metabolic Panel:  Lab 0000000 1105  NA 137  K 4.7  CL 102  CO2 25  GLUCOSE 54*  BUN 54*  CREATININE 2.74*  CALCIUM 9.5  MG --  PHOS --   GFR CrCl is unknown because there is no height on file for the current visit.  CBC:  Lab 06/23/11 1105  WBC 8.9  NEUTROABS 5.8  HGB 11.0*  HCT 33.1*  MCV 85.5  PLT 246   CBG:  Lab 06/23/11 1536 06/23/11 1204 06/23/11 1129  GLUCAP 61* 93 39*   Imaging results:  No results found.  Assessment & Plan: Principal Problem:  *ARF (acute renal failure)  Unclear etiology.  May be related to dehydration from N/V and ongoing ARB/diuretic use.  Admit and hydrate, anti-emetics as needed, hold ARB/diuretics.  Follow renal function.  Renal ultrasound if no improvement with IVF. Active Problems:  Hypoglycemia associated with diabetes  Likely from decreased renal clearance of insulin.  Hold DM  medications including insulin.  Add dextrose to IVF until sugars stabilized.  DM (diabetes mellitus)  Check hemoglobin A1c.  Hold insulin, exanetide, metformin.  HTN (hypertension)  Hold anti-hypertensives for now.  Hyperlipidemia  Hold crestor for now.  Check liver function given complaints of abdominal pain.  Normocytic anemia  Maybe from anemia of acute illness.  Monitor.  UTI (lower urinary tract infection)  Significant bactiuria noted.    Send culture and start empiric Rocephin.  Prophylaxis: PAS hoses for DVT prophylaxis.  Time Spent On Admission: 1 hour.  Idali Lafever 06/23/2011, 3:52 PM Pager [phone]

## 2011-06-23 NOTE — ED Notes (Signed)
Pt CBG-39

## 2011-06-23 NOTE — Progress Notes (Signed)
CRITICAL VALUE ALERT  Critical value received:  CBG = 58 Date of notification:  06/23/11  Time of notification:  17:10, then CBG = 78 after 8 oz. Orange juice given (17:42)  Critical value read back:yes  Nurse who received alert:  Brandon Melnick, RN  MD notified (1st page):  C. Rama   Time of first page:  18:10 MD notified (2nd page):  Time of second page:  Responding MD: Loletha Grayer Rama Time MD responded:

## 2011-06-23 NOTE — ED Notes (Signed)
CBG-61 given OJ and Peanut butter crackers.

## 2011-06-23 NOTE — ED Provider Notes (Signed)
History     CSN: [id number]  Arrival date & time 06/23/11  [id number]   First MD Initiated Contact with Patient 06/23/11 1008      Chief Complaint  Patient presents with  . Back Pain  . Abdominal Pain    (Consider location/radiation/quality/duration/timing/severity/associated sxs/prior treatment) HPI Comments: Briana Garrison is a 61 y.o. Female who has had lower abdomen, and lower back pain for 3 days that waxes and wanes. She also complains of spotty memory loss for things, such as numbers and parts of her medical history. She is otherwise doing okay at home. She denies fever, chills, nausea, vomiting, dysuria, urinary frequency, or change in bowel habits. She's been taking her regular medicines as usual. She admits to stress due to her sister being recently diagnosed with multiple sclerosis. Her lower back pain is worse with movement.  Patient is a 61 y.o. female presenting with back pain and abdominal pain. The history is provided by the patient and the spouse.  Back Pain  Associated symptoms include abdominal pain.  Abdominal Pain The primary symptoms of the illness include abdominal pain.  Additional symptoms associated with the illness include back pain.    Past Medical History  Diagnosis Date  . Diabetes mellitus   . Hypertension   . Cancer of kidney 05/14/2009  . Internal hemorrhoids     S/P colonoscopy 06/10/00 by Dr. Lajoyce Corners  . Endometrial mass   . GERD (gastroesophageal reflux disease)   . Shingles   . Hypercholesterolemia     Past Surgical History  Procedure Date  . Cholecystectomy 11/13/08  . Cystoscopy kidney w/ ureteral guide wire 05/14/09  . Diagnostic hysteroscopy, d&c, endometrial polypectomy 07/01/2005  . Lipectomy     Family History  Problem Relation Age of Onset  . Ovarian cancer Mother   . Multiple sclerosis Sister   . Stroke Mother   . Stroke Father   . Stroke Brother     History  Substance Use Topics  . Smoking status: Never Smoker   . Smokeless  tobacco: Not on file  . Alcohol Use: No    OB History    Grav Para Term Preterm Abortions TAB SAB Ect Mult Living                  Review of Systems  Gastrointestinal: Positive for abdominal pain.  Musculoskeletal: Positive for back pain.  All other systems reviewed and are negative.    Allergies  Review of patient's allergies indicates no known allergies.  Home Medications   Current Outpatient Rx  Name Route Sig Dispense Refill  . CIPROFLOXACIN HCL 500 MG PO TABS Oral Take 1 tablet (500 mg total) by mouth 2 (two) times daily. 10 tablet 0    BP 113/76  Pulse 95  Temp(Src) 97.6 F (36.4 C) (Oral)  Resp 18  Ht 5\' 1"  (1.549 m)  Wt 181 lb (82.101 kg)  BMI 34.20 kg/m2  SpO2 97%  Physical Exam  Nursing note and vitals reviewed. Constitutional: She is oriented to person, place, and time. She appears well-developed and well-nourished.  HENT:  Head: Normocephalic and atraumatic.  Eyes: Conjunctivae and EOM are normal. Pupils are equal, round, and reactive to light.  Neck: Normal range of motion and phonation normal. Neck supple.  Cardiovascular: Normal rate, regular rhythm and intact distal pulses.   Pulmonary/Chest: Effort normal and breath sounds normal. She exhibits no tenderness.  Abdominal: Soft. She exhibits no distension and no mass. There is no tenderness (Bilateral lower  quadrant, and suprapubic tenderness, mild). There is no rebound and no guarding.  Musculoskeletal: Normal range of motion.       Mild diffuse lumbar tenderness  Neurological: She is alert and oriented to person, place, and time. She has normal strength. She exhibits normal muscle tone.       Note a aphasia, or dysarthria. Scattered, memory loss.  Skin: Skin is warm and dry.  Psychiatric: Her behavior is normal.       Anxious    ED Course  Procedures (including critical care time)  Hypoglycemia, treated with oral food and fluid. CBGs improved IV fluids, given for dehydration  Reevaluation,  13:39- she states that she is more alert since her glucose has improved. She now states that 5 days ago she was vomiting for 2 days. She has been somewhat anorexic for the last 3 days.    Labs Reviewed  URINALYSIS, ROUTINE W REFLEX MICROSCOPIC - Abnormal; Notable for the following:    APPearance CLOUDY (*)    Hgb urine dipstick TRACE (*)    Protein, ur 30 (*)    Leukocytes, UA TRACE (*)    All other components within normal limits  BASIC METABOLIC PANEL - Abnormal; Notable for the following:    Glucose, Bld 54 (*)    BUN 54 (*)    Creatinine, Ser 2.74 (*)    GFR calc non Af Amer 18 (*)    GFR calc Af Amer 20 (*)    All other components within normal limits  CBC - Abnormal; Notable for the following:    Hemoglobin 11.0 (*)    HCT 33.1 (*)    All other components within normal limits  DIFFERENTIAL - Abnormal; Notable for the following:    Monocytes Relative 13 (*)    Monocytes Absolute 1.1 (*)    All other components within normal limits  URINE MICROSCOPIC-ADD ON - Abnormal; Notable for the following:    Bacteria, UA MANY (*) MANY   Casts GRANULAR CAST (*) GRANULAR CAST   All other components within normal limits  GLUCOSE, CAPILLARY - Abnormal; Notable for the following:    Glucose-Capillary 39 (*)    All other components within normal limits  HEMOGLOBIN A1C - Abnormal; Notable for the following:    Hemoglobin A1C 8.8 (*)    Mean Plasma Glucose 206 (*)    All other components within normal limits  GLUCOSE, CAPILLARY - Abnormal; Notable for the following:    Glucose-Capillary 61 (*)    All other components within normal limits  HEPATIC FUNCTION PANEL - Abnormal; Notable for the following:    Total Bilirubin 0.1 (*)    All other components within normal limits  BASIC METABOLIC PANEL - Abnormal; Notable for the following:    Glucose, Bld 155 (*)    BUN 39 (*)    Creatinine, Ser 2.15 (*)    GFR calc non Af Amer 24 (*)    GFR calc Af Amer 27 (*)    All other components  within normal limits  CBC - Abnormal; Notable for the following:    RBC 3.67 (*)    Hemoglobin 10.1 (*)    HCT 31.3 (*)    All other components within normal limits  GLUCOSE, CAPILLARY - Abnormal; Notable for the following:    Glucose-Capillary 58 (*)    All other components within normal limits  GLUCOSE, CAPILLARY - Abnormal; Notable for the following:    Glucose-Capillary 179 (*)    All other components  within normal limits  GLUCOSE, CAPILLARY - Abnormal; Notable for the following:    Glucose-Capillary 274 (*)    All other components within normal limits  GLUCOSE, CAPILLARY - Abnormal; Notable for the following:    Glucose-Capillary 184 (*)    All other components within normal limits  GLUCOSE, CAPILLARY - Abnormal; Notable for the following:    Glucose-Capillary 157 (*)    All other components within normal limits  GLUCOSE, CAPILLARY - Abnormal; Notable for the following:    Glucose-Capillary 128 (*)    All other components within normal limits  GLUCOSE, CAPILLARY - Abnormal; Notable for the following:    Glucose-Capillary 136 (*)    All other components within normal limits  GLUCOSE, CAPILLARY - Abnormal; Notable for the following:    Glucose-Capillary 260 (*)    All other components within normal limits  GLUCOSE, CAPILLARY - Abnormal; Notable for the following:    Glucose-Capillary 219 (*)    All other components within normal limits  BASIC METABOLIC PANEL - Abnormal; Notable for the following:    Glucose, Bld 182 (*)    BUN 29 (*)    Creatinine, Ser 1.75 (*)    GFR calc non Af Amer 30 (*)    GFR calc Af Amer 35 (*)    All other components within normal limits  GLUCOSE, CAPILLARY - Abnormal; Notable for the following:    Glucose-Capillary 156 (*)    All other components within normal limits  GLUCOSE, CAPILLARY - Abnormal; Notable for the following:    Glucose-Capillary 187 (*)    All other components within normal limits  GLUCOSE, CAPILLARY - Abnormal; Notable for  the following:    Glucose-Capillary 193 (*)    All other components within normal limits  GLUCOSE, CAPILLARY  URINE CULTURE  MAGNESIUM  PHOSPHORUS  GLUCOSE, CAPILLARY   No results found.   1. Hypoglycemia   2. Dehydration   3. Renal insufficiency   4. Confusion   5. ARF (acute renal failure)   6. Hypoglycemia associated with diabetes   7. DM (diabetes mellitus)   8. HTN (hypertension)   9. Hyperlipidemia   10. Normocytic anemia   11. UTI (lower urinary tract infection)       MDM  Non-specific abdominal and back pain, with confusion, hypoglycemia and dehydration. Her creatinine, is significantly elevated. She'll need to be admitted for management, further assessment and treatment; as determined by the progression of her illness.       Richarda Blade, MD 06/25/11 [phone]

## 2011-06-23 NOTE — ED Notes (Signed)
Pt c/o of lower back and lower stomach pains that started yesterday. Also c/o of confusion that started 3 days ago and that she could not remember things such as bank card number, mail box number but remembers children names. Pt states that her "train of thought comes and goes".

## 2011-06-23 NOTE — Progress Notes (Signed)
Patient arrived on floor and oriented to unit and assessed, vitals taken.  MD paged per orders.

## 2011-06-24 DIAGNOSIS — E1169 Type 2 diabetes mellitus with other specified complication: Secondary | ICD-10-CM

## 2011-06-24 DIAGNOSIS — E782 Mixed hyperlipidemia: Secondary | ICD-10-CM

## 2011-06-24 DIAGNOSIS — N179 Acute kidney failure, unspecified: Secondary | ICD-10-CM

## 2011-06-24 DIAGNOSIS — R1084 Generalized abdominal pain: Secondary | ICD-10-CM

## 2011-06-24 DIAGNOSIS — R4182 Altered mental status, unspecified: Secondary | ICD-10-CM

## 2011-06-24 LAB — GLUCOSE, CAPILLARY
Glucose-Capillary: 184 mg/dL — ABNORMAL HIGH (ref 70–99)
Glucose-Capillary: 260 mg/dL — ABNORMAL HIGH (ref 70–99)

## 2011-06-24 LAB — BASIC METABOLIC PANEL
BUN: 39 mg/dL — ABNORMAL HIGH (ref 6–23)
CO2: 22 mEq/L (ref 19–32)
Calcium: 8.7 mg/dL (ref 8.4–10.5)
GFR calc non Af Amer: 24 mL/min — ABNORMAL LOW (ref >90)
Glucose, Bld: 155 mg/dL — ABNORMAL HIGH (ref 70–99)
Potassium: 4.4 mEq/L (ref 3.5–5.1)
Sodium: 137 mEq/L (ref 135–145)

## 2011-06-24 LAB — CBC
Hemoglobin: 10.1 g/dL — ABNORMAL LOW (ref 12.0–15.0)
MCH: 27.5 pg (ref 26.0–34.0)
MCHC: 32.3 g/dL (ref 30.0–36.0)
MCV: 85.3 fL (ref 78.0–100.0)
RBC: 3.67 MIL/uL — ABNORMAL LOW (ref 3.87–5.11)

## 2011-06-24 LAB — URINE CULTURE

## 2011-06-24 MED ORDER — CIPROFLOXACIN HCL 500 MG PO TABS: 500.0000 mg | ORAL_TABLET | Freq: Two times a day (BID) | ORAL | Status: DC

## 2011-06-24 MED ORDER — HYDRALAZINE HCL 25 MG PO TABS
25.0000 mg | ORAL_TABLET | Freq: Three times a day (TID) | ORAL | Status: DC
  Administered 2011-06-24 – 2011-06-26 (×5): 25 mg via ORAL
  Filled 2011-06-24 (×9): qty 1

## 2011-06-24 NOTE — Discharge Summary (Deleted)
Patient ID: Briana Garrison MRN: [id number] DOB/AGE: 1950/12/03 61 y.o.  Admit date: 06/23/2011 Discharge date: 06/24/2011  Primary Care Physician:  Horatio Pel, MD, MD    Medication List  As of 06/24/2011  4:18 PM   STOP taking these medications         amoxicillin 500 MG capsule      metFORMIN 1000 MG tablet         TAKE these medications         aspirin 325 MG tablet   Take 325 mg by mouth daily.      BYDUREON 2 MG Susr   Generic drug: Exenatide   Inject 2 mg into the skin once a week. On Thursday      cholecalciferol 1000 UNITS tablet   Commonly known as: VITAMIN D   Take 2,000 Units by mouth daily.      ciprofloxacin 500 MG tablet   Commonly known as: CIPRO   Take 1 tablet (500 mg total) by mouth 2 (two) times daily.      cloNIDine 0.1 MG tablet   Commonly known as: CATAPRES   Take 0.1 mg by mouth daily.      EDARBYCLOR 40-25 MG Tabs   Generic drug: Azilsartan-Chlorthalidone   Take 1 tablet by mouth daily. Patient takes 1-2 tablets by mouth every 4 to 6 hours as needed for pain      guaiFENesin 600 MG 12 hr tablet   Commonly known as: MUCINEX   Take 1,200 mg by mouth 2 (two) times daily.      HYDROcodone-acetaminophen 5-500 MG per tablet   Commonly known as: VICODIN   Take 1-2 tablets by mouth every 4 (four) hours as needed.      ibuprofen 200 MG tablet   Commonly known as: ADVIL,MOTRIN   Take 400 mg by mouth every 6 (six) hours as needed. For pain      insulin glargine 100 UNIT/ML injection   Commonly known as: LANTUS   Inject 64 Units into the skin at bedtime.      loratadine 10 MG tablet   Commonly known as: CLARITIN   Take 10 mg by mouth daily.      menthol-cetylpyridinium 3 MG lozenge   Commonly known as: CEPACOL   Take 1 lozenge by mouth as needed.      pseudoephedrine 120 MG 12 hr tablet   Commonly known as: SUDAFED   Take 120 mg by mouth every 12 (twelve) hours.      rosuvastatin 20 MG tablet   Commonly known as: CRESTOR     Take 20 mg by mouth daily.      Similasan Dry Eye Relief Soln   Apply 1 drop to eye every 12 (twelve) hours as needed. For allergy eyes      SIMILASAN EARACHE RELIEF OT   Place 1 drop in ear(s) daily. For left ear      vitamin B-12 500 MCG tablet   Commonly known as: CYANOCOBALAMIN   Take 500 mcg by mouth daily.      vitamin C with rose hips 500 MG tablet   Take 500 mg by mouth daily.            Significant Diagnostic Studies:  No results found.  Physical Exam on Discharge:  Filed Vitals:   06/23/11 1600 06/23/11 2155 06/24/11 0611 06/24/11 1317  BP: 132/78 149/84 128/77 151/85  Pulse: 84 81 76 85  Temp: 97.8 F (36.6 C) 98 F (36.7 C) 97.5  F (36.4 C) 98 F (36.7 C)  TempSrc: Oral Oral Oral Oral  Resp: 18 16 16 18   Height: 5\' 1"  (1.549 m)     Weight: 82.101 kg (181 lb)     SpO2: 100% 98% 98% 98%     Intake/Output Summary (Last 24 hours) at 06/24/11 1618 Last data filed at 06/24/11 1317  Gross per 24 hour  Intake    960 ml  Output   1050 ml  Net    -90 ml    General: Alert, awake, oriented x3, in no acute distress. HEENT: No bruits, no goiter. Heart: Regular rate and rhythm, without murmurs, rubs, gallops. Lungs: Clear to auscultation bilaterally. Abdomen: Soft, nontender, nondistended, positive bowel sounds. Extremities: No clubbing cyanosis or edema with positive pedal pulses. Neuro: Grossly intact, nonfocal.  CBC:    Component Value Date/Time   WBC 8.0 06/24/2011 0400   HGB 10.1* 06/24/2011 0400   HCT 31.3* 06/24/2011 0400   PLT 254 06/24/2011 0400   MCV 85.3 06/24/2011 0400   NEUTROABS 5.8 06/23/2011 1105   LYMPHSABS 1.9 06/23/2011 1105   MONOABS 1.1* 06/23/2011 1105   EOSABS 0.2 06/23/2011 1105   BASOSABS 0.0 06/23/2011 [id number]    Basic Metabolic Panel:    Component Value Date/Time   NA 137 06/24/2011 0400   K 4.4 06/24/2011 0400   CL 106 06/24/2011 0400   CO2 22 06/24/2011 0400   BUN 39* 06/24/2011 0400   CREATININE 2.15* 06/24/2011 0400    GLUCOSE 155* 06/24/2011 0400   CALCIUM 8.7 06/24/2011 0400    Time spent on Discharge: 60 minutes  Signed: Leisa Lenz 06/24/2011, 4:18 PM

## 2011-06-24 NOTE — Progress Notes (Signed)
Patient Identification:  Briana Garrison Date of Evaluation:  06/24/2011 Reason for Consult:  Evaluate confusion  Referring Provider: Dr. Doyle Askew  History of Present Illness:Pt is admitted with c/o lower abdominal pain and low back pain.  She has DMII and say she has been helping her daughter who has MS.    Past Psychiatric History:She denies MH hx.   Past Medical History:     Past Medical History  Diagnosis Date  . Diabetes mellitus   . Hypertension   . Cancer of kidney 05/14/2009  . Internal hemorrhoids     S/P colonoscopy 06/10/00 by Dr. Lajoyce Corners  . Endometrial mass   . GERD (gastroesophageal reflux disease)   . Shingles   . Hypercholesterolemia        Past Surgical History  Procedure Date  . Cholecystectomy 11/13/08  . Cystoscopy kidney w/ ureteral guide wire 05/14/09  . Diagnostic hysteroscopy, d&c, endometrial polypectomy 07/01/2005  . Lipectomy     Allergies: No Known Allergies  Current Medications:  Prior to Admission medications   Medication Sig Start Date End Date Taking? Authorizing Provider  Ascorbic Acid (VITAMIN C WITH ROSE HIPS) 500 MG tablet Take 500 mg by mouth daily.   Yes Historical Provider, MD  aspirin 325 MG tablet Take 325 mg by mouth daily.   Yes Historical Provider, MD  Azilsartan-Chlorthalidone (EDARBYCLOR) 40-25 MG TABS Take 1 tablet by mouth daily. Patient takes 1-2 tablets by mouth every 4 to 6 hours as needed for pain   Yes Historical Provider, MD  cholecalciferol (VITAMIN D) 1000 UNITS tablet Take 2,000 Units by mouth daily.   Yes Historical Provider, MD  cloNIDine (CATAPRES) 0.1 MG tablet Take 0.1 mg by mouth daily.   Yes Historical Provider, MD  Exenatide (BYDUREON) 2 MG SUSR Inject 2 mg into the skin once a week. On Thursday   Yes Historical Provider, MD  guaiFENesin (MUCINEX) 600 MG 12 hr tablet Take 1,200 mg by mouth 2 (two) times daily.   Yes Historical Provider, MD  Homeopathic Products [facility] DRY EYE RELIEF) SOLN Apply 1 drop to eye every 12  (twelve) hours as needed. For allergy eyes   Yes Historical Provider, MD  Homeopathic Products [facility] EARACHE RELIEF OT) Place 1 drop in ear(s) daily. For left ear   Yes Historical Provider, MD  HYDROcodone-acetaminophen (VICODIN) 5-500 MG per tablet Take 1-2 tablets by mouth every 4 (four) hours as needed.   Yes Historical Provider, MD  ibuprofen (ADVIL,MOTRIN) 200 MG tablet Take 400 mg by mouth every 6 (six) hours as needed. For pain   Yes Historical Provider, MD  insulin glargine (LANTUS) 100 UNIT/ML injection Inject 64 Units into the skin at bedtime.   Yes Historical Provider, MD  loratadine (CLARITIN) 10 MG tablet Take 10 mg by mouth daily.   Yes Historical Provider, MD  menthol-cetylpyridinium (CEPACOL) 3 MG lozenge Take 1 lozenge by mouth as needed.   Yes Historical Provider, MD  pseudoephedrine (SUDAFED) 120 MG 12 hr tablet Take 120 mg by mouth every 12 (twelve) hours.   Yes Historical Provider, MD  rosuvastatin (CRESTOR) 20 MG tablet Take 20 mg by mouth daily.   Yes Historical Provider, MD  vitamin B-12 (CYANOCOBALAMIN) 500 MCG tablet Take 500 mcg by mouth daily.   Yes Historical Provider, MD  ciprofloxacin (CIPRO) 500 MG tablet Take 1 tablet (500 mg total) by mouth 2 (two) times daily. 06/24/11 07/04/11  Robbie Lis, MD    Social History:    reports that she has  never smoked. She does not have any smokeless tobacco history on file. She reports that she does not drink alcohol or use illicit drugs.   Family History:    Family History  Problem Relation Age of Onset  . Ovarian cancer Mother   . Multiple sclerosis Sister   . Stroke Mother   . Stroke Father   . Stroke Brother     Mental Status Examination/Evaluation: Objective:  Appearance: Casual  Psychomotor Activity:  Normal  Eye Contact::  Good  Speech:  Clear and Coherent  Volume:  Normal  Mood:  Euthymic  Affect:  Congruent  Thought Process:  Coherent and states she sometimes gets confused; not observed with thie  evaluation  Orientation:  Full  Thought Content:  Paranoia NONE  Suicidal Thoughts:  No  Homicidal Thoughts:  No  Judgement:  Good  Insight:  Good    DIAGNOSIS:   AXIS I   No Psychiatric Diagnosis; possible early dementia; today able to repeat 8 digits correctly; + abstract thought process  AXIS II  Deffered  AXIS III See medical notes.  AXIS IV other psychosocial or environmental problems, problems related to social environment and problems with access to health care services  AXIS V 61-70 mild symptoms     Assessment/Plan:  Discussed with Dr. Doyle Askew; Psych CSW Pt is visiting with friend and son in law.  She says she has two sons; one died.  She says she likes to be jolly and has never had suicidal/homicidal thoughts.  She interprets a proverb abstractly and is able to repeat [short term memory] 8 digit numbers.  She repeats a phrase correctly.  She is stating she helps so many that she forgets to help herself [insight].  This pt has capacity and at no time appears confused today.  Dr. Doyle Askew is notified.  RECOMMENDATION:  1. CONTINUE NECESSARY MEDICATIONS. 2. THIS PAT HAS CAPACITY AND MAY BE DISCHARGED TO HOME WHEN MEDICALLY NECESSARY.  3. NO FURTHER Psychiatric needs.  MD Psychiatrist signs off.  Kolbie Clarkston J. Evern Bio, MD Psychiatrist  06/24/2011 5:24 PM

## 2011-06-24 NOTE — Progress Notes (Signed)
Patient ID: Briana Garrison, female   DOB: [birth year], 61 y.o.   MRN: [id number]  Assessment & Plan:   Principal Problem:   *ARF (acute renal failure)  related to dehydration from N/V and ongoing ARB/diuretic use.  hold ARB/diuretics.  Creatinine improving Follow up BMP in am  Active Problems:   Hypoglycemia associated with diabetes  CBG 219, 260 continue sliding scale hold metformin Follow up A1c level  HTN (hypertension)  BP 151/85 Start hydralazine PO Q 8 hours 25 mg Hold ARB's  Hyperlipidemia  Hold crestor for now.  Follow up lipid panel  Normocytic anemia  Maybe from anemia of acute illness. Monitor.  UTI (lower urinary tract infection)  Significant bactiuria noted.  Send culture and started empiric Rocephin.  Disposition - anticipate D/C in am if creatinine improves  Consults to date 1. Psychiatry   Subjective: No events overnight. Patient denies chest pain, shortness of breath, abdominal pain.  Objective:  Vital signs in last 24 hours:  Filed Vitals:   06/23/11 1600 06/23/11 2155 06/24/11 0611 06/24/11 1317  BP: 132/78 149/84 128/77 151/85  Pulse: 84 81 76 85  Temp: 97.8 F (36.6 C) 98 F (36.7 C) 97.5 F (36.4 C) 98 F (36.7 C)  TempSrc: Oral Oral Oral Oral  Resp: 18 16 16 18   Height: 5\' 1"  (1.549 m)     Weight: 82.101 kg (181 lb)     SpO2: 100% 98% 98% 98%    Intake/Output from previous day:  Intake/Output Summary (Last 24 hours) at 06/24/11 1629 Last data filed at 06/24/11 1317  Gross per 24 hour  Intake    960 ml  Output   1050 ml  Net    -90 ml    Physical Exam: General: Alert, awake, oriented x3, in no acute distress. HEENT: No bruits, no goiter. Moist mucous membranes, no scleral icterus, no conjunctival pallor. Heart: Regular rate and rhythm, S1/S2 +, no murmurs, rubs, gallops. Lungs: Clear to auscultation bilaterally. No wheezing, no rhonchi, no rales.  Abdomen: Soft, nontender, nondistended, positive bowel  sounds. Extremities: No clubbing or cyanosis, no pitting edema,  positive pedal pulses. Neuro: Grossly nonfocal.  Lab Results:  Lab 06/24/11 0400 06/23/11 1105  WBC 8.0 8.9  HGB 10.1* 11.0*  HCT 31.3* 33.1*  PLT 254 246  MCV 85.3 85.5    Lab 06/24/11 0400 06/23/11 1105  NA 137 137  K 4.4 4.7  CL 106 102  CO2 22 25  GLUCOSE 155* 54*  BUN 39* 54*  CREATININE 2.15* 2.74*  CALCIUM 8.7 9.5    Medications: Scheduled Meds:   . aspirin  325 mg Oral Daily  . cefTRIAXone (ROCEPHIN)  IV  1 g Intravenous Q24H  . cholecalciferol  2,000 Units Oral Daily  . insulin aspart  0-5 Units Subcutaneous QHS  . insulin aspart  0-9 Units Subcutaneous TID WC  . loratadine  10 mg Oral Daily  . vitamin B-12  500 mcg Oral Daily  . vitamin C with rose hips  500 mg Oral Daily   Continuous Infusions:   . dextrose 5 % and 0.9% NaCl 125 mL/hr at 06/24/11 1426   PRN Meds:.acetaminophen, acetaminophen, ondansetron (ZOFRAN) IV, ondansetron, oxyCODONE-acetaminophen, polyethylene glycol    LOS: 1 day   Larna Capelle 06/24/2011, 4:29 PM  [facility] Pager: [phone]

## 2011-06-24 NOTE — Evaluation (Signed)
Physical Therapy Evaluation Patient Details Name: Briana Garrison MRN: [id number] DOB: December 01, 1950 Today's Date: 06/24/2011 Time: [id number] PT Time Calculation (min): 12 min  PT Assessment / Plan / Recommendation Clinical Impression  Pt presents with acute renal failure, abdominal pain, N/V.  Presents today without pain.  Tolerated ambulation in hallway without imbalance and without AD.  Also performed balance activities without LOB.  Pt will not need any further therapy in acute care or for f/u at home.  Pt ready for D/C today.     PT Assessment  Patent does not need any further PT services    Follow Up Recommendations  No PT follow up    Barriers to Discharge        lEquipment Recommendations       Recommendations for Other Services     Frequency      Precautions / Restrictions Precautions Precautions: None Restrictions Weight Bearing Restrictions: No   Pertinent Vitals/Pain No pain      Mobility  Bed Mobility Bed Mobility: Not assessed Transfers Transfers: Sit to Stand;Stand to Sit Sit to Stand: 7: Independent Stand to Sit: 7: Independent Ambulation/Gait Ambulation/Gait Assistance: 7: Independent Ambulation Distance (Feet): 200 Feet Assistive device: Other (Comment) (initially used IV pole, transitioned to no AD) Gait Pattern: Within Functional Limits Stairs: No Wheelchair Mobility Wheelchair Mobility: No    Exercises     PT Diagnosis:    PT Problem List:   PT Treatment Interventions:     PT Goals    Visit Information  Last PT Received On: 06/24/11 Assistance Needed: +1    Subjective Data  Subjective: I think i'm going home today Patient Stated Goal: I'm ready to get back home.   Prior Functioning  Home Living Lives With: Spouse Available Help at Discharge: Family Type of Home: House Home Access: Stairs to enter Entrance Stairs-Rails: None Home Layout: One level Bathroom Shower/Tub: Loss adjuster, chartered: None Prior Function Level of Independence: Independent Able to Take Stairs?: Yes Driving: Yes Vocation: Retired Corporate investment banker: No difficulties    Cognition  Overall Cognitive Status: Appears within functional limits for tasks assessed/performed Arousal/Alertness: Awake/alert Orientation Level: Appears intact for tasks assessed Behavior During Session: [facility] for tasks performed    Extremity/Trunk Assessment Right Lower Extremity Assessment RLE ROM/Strength/Tone: Within functional levels RLE Sensation: WFL - Light Touch RLE Coordination: WFL - gross/fine motor Left Lower Extremity Assessment LLE ROM/Strength/Tone: Within functional levels LLE Sensation: WFL - Light Touch LLE Coordination: WFL - gross/fine motor   Balance High Level Balance High Level Balance Comments: Performed heel to toe x 30 secs without LOB and one leg stance x 10 secs without LOB.    End of Session PT - End of Session Activity Tolerance: Patient tolerated treatment well Patient left: in chair;with call bell/phone within reach Nurse Communication: Mobility status   Page, Betha Loa 06/24/2011, 3:48 PM

## 2011-06-24 NOTE — Discharge Instructions (Signed)
Kidney Failure  In kidney failure, the kidneys lose their ability to filter enough waste products from the blood. They also lose the ability to regulate the body's balance of salt and water. Eventually, the kidneys slow their production of urine or stop producing it completely. Waste products and water gather in the body. This can lead to a life-threatening overload of fluids (such as heart failure). It can also lead to a dangerous buildup of waste products in the blood. These extreme changes in blood chemistry can affect the function of the heart and brain.   TYPES OF KIDNEY FAILURE  Acute kidney failure. In this form of kidney failure, the kidneys stop working properly because of a sudden illness, a medicine, or a medical condition that causes one of the following:    A severe drop in blood pressure or an interruption in the normal blood flow to the kidneys. This can occur during:   Major surgery.   Severe burns with fluid loss.   Massive bleeding.   A heart attack that severely affects heart function.   Blood clots that travel to the kidney.   Direct damage to kidney cells or to the kidneys' filtering units. This can be caused by:   An inflammation of the kidneys.   Toxic chemicals.   Medicines or infections.   Blocked urine flow from the kidney. This can occur because of obstructions outside the kidney, such as:   Kidney stones.   Bladder tumors.   An enlarged prostate.  Blockage of urine flow within the kidney can also cause sudden kidney failure, as can occur with large muscle injuries.   Chronic kidney failure. In this form of kidney failure, the kidney gradually loses function. This happens over a period of years. It is a slow and gradual loss of the ability of the kidneys to send out wastes, concentrate urine, and conserve the salts in your blood. Some of the causes of chronic kidney failure are:   Diabetes (very common cause).   Polycystic kidney disease.   Glomerulonephritis.   Alport  syndrome.   The flow of urine out of the kidney is blocked (obstructive uropathy).   High blood pressure (very common).   Long-term exposure to lead, mercury, and other chemicals and medicines.   Kidney stones with infection.   Reflux nephropathy.   Pain medicine overuse.  Some forms of chronic kidney failure run in families. Your caregiver will ask you about family medical problems.   End-stage kidney disease (ESKD). This is also called end-stage kidney failure. In ESKD, kidney function worsens until the person dies. This is usually the result of longstanding chronic kidney failure, but sometimes it follows acute kidney failure.  SYMPTOMS   Symptoms vary depending on the type of kidney failure.    Acute kidney failure. Symptoms include:   Swelling (edema) resulting from salt and water overload.   High blood pressure.   Vomiting.   Tiredness (lethargy) caused by the toxic effects of waste products on brain function.   Feeling sick to your stomach (nauseous).   Decreased urine output.   Chronic kidney failure and ERSD. Because the kidney damage in chronic kidney failure occurs slowly over a long time, symptoms develop slowly. Symptoms can include:   Headache.   Weakness.   Itching.   Vomiting.   Pale skin.   Slowing of growth in children.   Fatigue.   Tiredness (lethargy).   Poor appetite.   Increased thirst.   High blood pressure.     Bone damage in adults.  DIAGNOSIS   If you have an illness or medical condition that increases the risk of acute kidney failure, your caregivers will watch you closely. You may have blood and urine tests that measure the function of your kidneys. If you have a medical condition that increases the risk of long-term kidney damage, your caregiver will check your blood pressure and look for symptoms of chronic kidney failure during rechecks.  TREATMENT   Treatment depends on the type of kidney failure.    Acute kidney failure. Treatment begins with measures to  correct the cause of kidney failure (shock, hemorrhage, burns, heart attack). After this has begun, more specific kidney treatment may include:   Fluids given through the vein (intravenously) to correct any abnormal fluid loss.   Medicines called diuretics that increase urine output.   Limited fluids by mouth.   A diet low in protein and high in carbohydrates.   Medicines to adjust high or low levels of blood chemicals, such as potassium and medicines to control high blood pressure.   Short-term dialysis may be necessary if the patient develops severe high blood pressure, severe fluid overload, heart failure, symptoms of altered brain function, or severe abnormalities in blood chemistry.   Chronic kidney failure. People with chronic kidney failure are watched closely. They receive frequent physical exams, blood pressure checks, and blood testing. Treatment includes:   A low-protein and low-salt diet.   Medicines to adjust blood chemical levels.   Medicines to treat high blood pressure.   Sometimes, a hormonal medicine called erythropoietin is given to correct a low level of red blood cells (anemia).   ESKD. Treatment includes:   Dialysis until a donor can be found for a kidney transplant. Dialysis mechanically removes waste products from the blood.   Both kidneys may need to be removed surgically before a transplant in patients with severe high blood pressure or chronic pyelonephritis.  PROGNOSIS    Acute kidney failure may go away on its own. Some people recover within a matter of days. Exactly how long the illness lasts varies greatly from person-to-person. The duration depends on the cause of the kidney problem. In rare cases, acute kidney failure progresses to ESKD. Among people who recover, about 50% have some permanent kidney damage. In most cases, this is not severe enough to prevent you from living a normal life.   Chronic kidney failure is a lifelong problem that can worsen over time to  become ESKD. Not everyone develops ESKD. For those who do, the time it takes for ESKD to develop varies from person-to-person.   ESKD is a permanent condition that can be treated only with dialysis or a kidney transplant.  PREVENTION   Many forms of kidney failure cannot be prevented. People who have diabetes, high blood pressure, or coronary artery disease should try to control the illness with:   Appropriate diet.   Medicine.   Lifestyle changes.  If you have chronic kidney failure, you should tell all caregivers who treat you.   HOME CARE INSTRUCTIONS    Follow your diet and take your medicines as instructed.   Do not use any new medicines (prescription, over-the-counter, or nutritional supplements) unless approved by your caregiver. Many medicines can worsen your kidney damage or need to have the dose adjusted.   If dialysis is scheduled, keep all appointments. Call if you are unable to keep an appointment.  SEEK MEDICAL CARE IF:    You develop unexplained weakness, tiredness,   or appetite loss.   You feel poorly with no clear explanation.  SEEK IMMEDIATE MEDICAL CARE IF:    The amount of urine you produce either distinctly increases or decreases.   You develop swelling of the face and/or ankles.   You develop shortness of breath.  FOR MORE INFORMATION   National Institute of Diabetes and Digestive and Kidney Diseases: www2.niddk.nih.gov  National Kidney Foundation: www.kidney.org  Document Released: 01/13/2005 Document Revised: 01/02/2011 Document Reviewed: 05/16/2009  Exit[employer] Patient Information 2012 [employer].

## 2011-06-25 DIAGNOSIS — N179 Acute kidney failure, unspecified: Secondary | ICD-10-CM

## 2011-06-25 DIAGNOSIS — E1169 Type 2 diabetes mellitus with other specified complication: Secondary | ICD-10-CM

## 2011-06-25 LAB — BASIC METABOLIC PANEL
BUN: 29 mg/dL — ABNORMAL HIGH (ref 6–23)
Chloride: 106 mEq/L (ref 96–112)
GFR calc Af Amer: 35 mL/min — ABNORMAL LOW (ref >90)
Glucose, Bld: 182 mg/dL — ABNORMAL HIGH (ref 70–99)
Potassium: 4.8 mEq/L (ref 3.5–5.1)

## 2011-06-25 LAB — GLUCOSE, CAPILLARY
Glucose-Capillary: 187 mg/dL — ABNORMAL HIGH (ref 70–99)
Glucose-Capillary: 199 mg/dL — ABNORMAL HIGH (ref 70–99)
Glucose-Capillary: 335 mg/dL — ABNORMAL HIGH (ref 70–99)

## 2011-06-25 MED ORDER — AMLODIPINE BESYLATE 5 MG PO TABS
5.0000 mg | ORAL_TABLET | Freq: Every day | ORAL | Status: DC
  Administered 2011-06-25 – 2011-06-28 (×4): 5 mg via ORAL
  Filled 2011-06-25 (×4): qty 1

## 2011-06-25 MED ORDER — INSULIN ASPART 100 UNIT/ML ~~LOC~~ SOLN
0.0000 [IU] | Freq: Three times a day (TID) | SUBCUTANEOUS | Status: DC
  Administered 2011-06-25: 4 [IU] via SUBCUTANEOUS
  Administered 2011-06-25: 2 [IU] via SUBCUTANEOUS
  Administered 2011-06-26: 5 [IU] via SUBCUTANEOUS

## 2011-06-25 MED ORDER — INSULIN ASPART 100 UNIT/ML ~~LOC~~ SOLN
0.0000 [IU] | Freq: Every day | SUBCUTANEOUS | Status: DC
  Administered 2011-06-25: 4 [IU] via SUBCUTANEOUS

## 2011-06-25 MED ORDER — ATORVASTATIN CALCIUM 40 MG PO TABS
40.0000 mg | ORAL_TABLET | Freq: Every day | ORAL | Status: DC
  Administered 2011-06-26: 40 mg via ORAL
  Filled 2011-06-25 (×2): qty 1

## 2011-06-25 MED ORDER — INSULIN GLARGINE 100 UNIT/ML ~~LOC~~ SOLN
10.0000 [IU] | Freq: Every day | SUBCUTANEOUS | Status: DC
  Administered 2011-06-25: 10 [IU] via SUBCUTANEOUS

## 2011-06-25 NOTE — Progress Notes (Signed)
Inpatient Diabetes Program Recommendations  AACE/ADA: New Consensus Statement on Inpatient Glycemic Control (2009)  Target Ranges:  Prepandial:   less than 140 mg/dL      Peak postprandial:   less than 180 mg/dL (1-2 hours)      Critically ill patients:  140 - 180 mg/dL   Reason for Visit: Hyperglycemia  Results for Briana Garrison, Briana Garrison (MRN [id number]) as of 06/25/2011 15:19  Ref. Range 06/24/2011 08:00 06/24/2011 10:13 06/24/2011 11:48 06/24/2011 17:23 06/24/2011 21:10 06/25/2011 07:35 06/25/2011 11:42  Glucose-Capillary Latest Range: 70-99 mg/dL 136 (H) 260 (H) 219 (H) 156 (H) 187 (H) 193 (H) 315 (H)  Results for Briana Garrison, Briana Garrison (MRN [id number]) as of 06/25/2011 15:19  Ref. Range 06/23/2011 14:10  Hemoglobin A1C Latest Range: <5.7 % 8.8 (H)    Inpatient Diabetes Program Recommendations Insulin - Meal Coverage: Add Novolog 4 units tidwc for meal coverage insulin  Note: Pt is on high dose of Lantus at home (64 units QHS).  Lantus 10 units to start tonight.  May benefit from OP Diabetes Education consult for diet counseling.  HgbA1C uncontrolled at 8.8%.  Will follow.

## 2011-06-25 NOTE — Progress Notes (Signed)
Subjective: Patient states that she [facility] much [facility]. However, she still has some concerns about her memory not being back to what it normally was. The patient relates that for her day-to-day and a half prior to the hospital she was having vomiting and poor oral intake. She does not remember how she administered her medications. However please note the patient was significantly hypoglycemic with a blood sugar in the 30s upon arrival to the hospital. Injection the patient's prior records that her primary care physician's office it appears that the patient has a baseline creatinine of 1.3 and a baseline BUN of 25. Objective: Filed Vitals:   06/24/11 1317 06/24/11 2106 06/25/11 0622 06/25/11 1338  BP: 151/85 137/80 113/76 148/85  Pulse: 85 94 95 92  Temp: 98 F (36.7 C) 98.2 F (36.8 C) 97.6 F (36.4 C) 98.3 F (36.8 C)  TempSrc: Oral Oral Oral Oral  Resp: 18 18 18 18   Height:      Weight:      SpO2: 98% 95% 97% 96%   Weight change:   Intake/Output Summary (Last 24 hours) at 06/25/11 1812 Last data filed at 06/25/11 1300  Gross per 24 hour  Intake    530 ml  Output   1950 ml  Net  -1420 ml    General: Alert, awake, oriented x3, in no acute distress.  HEENT: [place]/AT [place], EOMI Neck: Trachea midline,  no masses, no thyromegal,y no JVD, no carotid bruit OROPHARYNX:  Moist, No exudate/ erythema/lesions.  Heart: Regular rate and rhythm, without murmurs, rubs, gallops, PMI non-displaced, no heaves or thrills on palpation.  Lungs: Clear to auscultation, no wheezing or rhonchi noted. No increased vocal fremitus resonant to percussion  Abdomen: Soft, nontender, nondistended, positive bowel sounds, no masses no hepatosplenomegaly noted..  Neuro: No focal neurological deficits noted cranial nerves II through XII grossly intact. DTRs 2+ bilaterally upper and lower extremities. Strength functional in bilateral upper and lower extremities. Musculoskeletal: No warm  swelling or erythema around joints, no spinal tenderness noted. Psychiatric: Patient alert and oriented x3, good insight and cognition, good recent to remote recall.      Lab Results:  Basename 06/25/11 0330 06/24/11 0400 06/23/11 1105  NA 138 137 --  K 4.8 4.4 --  CL 106 106 --  CO2 21 22 --  GLUCOSE 182* 155* --  BUN 29* 39* --  CREATININE 1.75* 2.15* --  CALCIUM 9.7 8.7 --  MG -- -- 2.0  PHOS -- -- 3.4    Basename 06/23/11 1105  AST 23  ALT 31  ALKPHOS 63  BILITOT 0.1*  PROT 7.4  ALBUMIN 3.6   No results found for this basename: LIPASE:2,AMYLASE:2 in the last 72 hours  Basename 06/24/11 0400 06/23/11 1105  WBC 8.0 8.9  NEUTROABS -- 5.8  HGB 10.1* 11.0*  HCT 31.3* 33.1*  MCV 85.3 85.5  PLT 254 246   No results found for this basename: CKTOTAL:3,CKMB:3,CKMBINDEX:3,TROPONINI:3 in the last 72 hours No components found with this basename: POCBNP:3 No results found for this basename: DDIMER:2 in the last 72 hours  Basename 06/23/11 1410  HGBA1C 8.8*   No results found for this basename: CHOL:2,HDL:2,LDLCALC:2,TRIG:2,CHOLHDL:2,LDLDIRECT:2 in the last 72 hours No results found for this basename: TSH,T4TOTAL,FREET3,T3FREE,THYROIDAB in the last 72 hours No results found for this basename: VITAMINB12:2,FOLATE:2,FERRITIN:2,TIBC:2,IRON:2,RETICCTPCT:2 in the last 72 hours  Micro Results: Recent Results (from the past 240 hour(s))  URINE CULTURE     Status: Normal   Collection Time  06/23/11  2:11 PM      Component Value Range Status Comment   Specimen Description URINE, CLEAN CATCH   Final    Special Requests NONE   Final    Culture  Setup Time [id number]   Final    Colony Count NO GROWTH   Final    Culture NO GROWTH   Final    Report Status 06/24/2011 FINAL   Final     Studies/Results: No results found.  Medications: I have reviewed the patient's current medications. Scheduled Meds:   . aspirin  325 mg Oral Daily  . cefTRIAXone (ROCEPHIN)  IV  1 g  Intravenous Q24H  . cholecalciferol  2,000 Units Oral Daily  . hydrALAZINE  25 mg Oral Q8H  . insulin aspart  0-5 Units Subcutaneous QHS  . insulin aspart  0-9 Units Subcutaneous TID WC  . insulin glargine  10 Units Subcutaneous QHS  . loratadine  10 mg Oral Daily  . vitamin B-12  500 mcg Oral Daily  . vitamin C with rose hips  500 mg Oral Daily  . DISCONTD: insulin aspart  0-5 Units Subcutaneous QHS  . DISCONTD: insulin aspart  0-9 Units Subcutaneous TID WC   Continuous Infusions:  PRN Meds:.acetaminophen, acetaminophen, ondansetron (ZOFRAN) IV, ondansetron, oxyCODONE-acetaminophen, polyethylene glycol Assessment/Plan: Patient Active Hospital Problem List: ARF (acute renal failure) (06/23/2011)   Assessment: Renal function continues to improve without any additional IV fluids. We'll continue to monitor renal function with evaluation of BUN creatinine.   Hypoglycemia associated with diabetes (06/23/2011)   Assessment: Patient had been on 50 units of long-acting medication at night prior to hospitalization. Her blood sugars are now elevated and I will start the patient back on a small dose of Lantus 10 units at night and observe her glycemic pattern response to this.    DM (diabetes mellitus) (06/23/2011)   Assessment: Patient has a hemoglobin A1c of 8.8 which shows poor control. However the patient was hypoglycemic upon arrival. I will discuss with the patient changing to Levemir as it seems to have a lower hypoglycemic profiles of Lantus.    HTN (hypertension) (06/23/2011)   Assessment: Patient's ACE receptor blocker has been discontinued because of her renal failure arrival to the hospital. However her blood pressures are elevated despite the hydralazine. In light of her diabetes this patient should either be an ACE inhibitor or a calcium channel blocker for best practice. I will add Norvasc 5 mg by mouth daily to her current regimen and titrate her hydralazine down and her Norvasc up as  tolerated by blood pressure.   Hyperlipidemia (06/23/2011)   Assessment: The patient can be resumed on her Crestor as I see no contraindication to its use at this time.     Normocytic anemia (06/23/2011)   Assessment: Hemoglobin stable at 10.1    UTI (lower urinary tract infection) (06/23/2011)   Assessment: Urine cultures are negative and I will discontinue Rocephin after today's dose. The patient would have received 3 doses of Rocephin after today's to      LOS: 2 days

## 2011-06-25 NOTE — Progress Notes (Signed)
Chart review.  Psych involved for capacity.  Per psych MD, Pt has capacity.  No further psych needs identified.  Psych CSW to sign off.  Please re-consult psych if additional needs arise.  Bernita Raisin, [place] [employer] [phone]

## 2011-06-25 NOTE — Progress Notes (Signed)
Pt ambulated in hall full length of hall past 1321 without difficulty. Tolerated very well. Denies dizziness,dyspnea or any other complaints.

## 2011-06-25 NOTE — Progress Notes (Signed)
Pt with intermit. episodes of short term memory loss,difficulty recalling recent events. Alert/oriented this am x4 but throughout my 7a-3p shift did notice pt forgetting recent discussion that we had this am and medications given,etc. Was reported to me from night shift that this has been going on with pt during this admission as previously reported by prior shifts also.

## 2011-06-26 DIAGNOSIS — N179 Acute kidney failure, unspecified: Secondary | ICD-10-CM

## 2011-06-26 DIAGNOSIS — E1129 Type 2 diabetes mellitus with other diabetic kidney complication: Secondary | ICD-10-CM

## 2011-06-26 LAB — BASIC METABOLIC PANEL
BUN: 29 mg/dL — ABNORMAL HIGH (ref 6–23)
CO2: 24 mEq/L (ref 19–32)
Calcium: 9.8 mg/dL (ref 8.4–10.5)
Creatinine, Ser: 1.66 mg/dL — ABNORMAL HIGH (ref 0.50–1.10)

## 2011-06-26 LAB — GLUCOSE, CAPILLARY
Glucose-Capillary: 272 mg/dL — ABNORMAL HIGH (ref 70–99)
Glucose-Capillary: 507 mg/dL — ABNORMAL HIGH (ref 70–99)
Glucose-Capillary: 398 mg/dL — ABNORMAL HIGH (ref 70–99)
Glucose-Capillary: 263 mg/dL — ABNORMAL HIGH (ref 70–99)

## 2011-06-26 LAB — GLUCOSE, RANDOM: Glucose, Bld: 488 mg/dL — ABNORMAL HIGH (ref 70–99)

## 2011-06-26 MED ORDER — DEXTROSE-NACL 5-0.45 % IV SOLN
INTRAVENOUS | Status: DC
  Administered 2011-06-26: 19:00:00 via INTRAVENOUS
  Administered 2011-06-26: 500 mL via INTRAVENOUS

## 2011-06-26 MED ORDER — DEXTROSE 50 % IV SOLN
25.0000 mL | INTRAVENOUS | Status: DC | PRN
  Filled 2011-06-26: qty 50

## 2011-06-26 MED ORDER — INSULIN ASPART 100 UNIT/ML ~~LOC~~ SOLN: 4.0000 [IU] | Freq: Three times a day (TID) | SUBCUTANEOUS | Status: DC

## 2011-06-26 MED ORDER — DOCUSATE SODIUM 100 MG PO CAPS
100.0000 mg | ORAL_CAPSULE | Freq: Two times a day (BID) | ORAL | Status: DC | PRN
  Filled 2011-06-26: qty 1

## 2011-06-26 MED ORDER — INSULIN GLARGINE 100 UNIT/ML ~~LOC~~ SOLN: 15.0000 [IU] | Freq: Every day | SUBCUTANEOUS | Status: DC

## 2011-06-26 MED ORDER — INSULIN ASPART 100 UNIT/ML ~~LOC~~ SOLN: 0.0000 [IU] | Freq: Every day | SUBCUTANEOUS | Status: DC

## 2011-06-26 MED ORDER — INSULIN ASPART 100 UNIT/ML ~~LOC~~ SOLN: 0.0000 [IU] | Freq: Three times a day (TID) | SUBCUTANEOUS | Status: DC

## 2011-06-26 MED ORDER — SODIUM CHLORIDE 0.9 % IV SOLN
INTRAVENOUS | Status: DC
  Administered 2011-06-26: 500 mL via INTRAVENOUS
  Administered 2011-06-27 (×3): via INTRAVENOUS

## 2011-06-26 MED ORDER — INSULIN GLARGINE 100 UNIT/ML ~~LOC~~ SOLN: 10.0000 [IU] | Freq: Once | SUBCUTANEOUS | Status: DC

## 2011-06-26 MED ORDER — INSULIN REGULAR BOLUS VIA INFUSION
0.0000 [IU] | Freq: Three times a day (TID) | INTRAVENOUS | Status: DC
  Filled 2011-06-26: qty 10

## 2011-06-26 MED ORDER — SODIUM CHLORIDE 0.9 % IV SOLN
INTRAVENOUS | Status: DC
  Administered 2011-06-26: 6.1 [IU]/h via INTRAVENOUS
  Administered 2011-06-26: 4.3 [IU]/h via INTRAVENOUS
  Administered 2011-06-26: 3.4 [IU]/h via INTRAVENOUS
  Administered 2011-06-26: 4.1 [IU]/h via INTRAVENOUS
  Administered 2011-06-26: 4.2 [IU]/h via INTRAVENOUS
  Filled 2011-06-26: qty 1

## 2011-06-26 NOTE — Progress Notes (Signed)
Introduced myself and offered support. Immediately, the patient asked for prayer. Strong Christian faith.  Will continue to visit and support.  06/26/11 1100  Clinical Encounter Type  Visited With Patient  Visit Type Initial  Referral From Nurse  Recommendations Follow up  Spiritual Encounters  Spiritual Needs Prayer;Emotional

## 2011-06-26 NOTE — Plan of Care (Signed)
Problem: Phase II Progression Outcomes Goal: Progress activity as tolerated unless otherwise ordered Outcome: Progressing Pt. Ambulating in the halls frequently

## 2011-06-26 NOTE — Progress Notes (Signed)
Subjective: Patient states that she feels even better today than she did yesterday. She states that her memory appears to be improving.  Interval history: Patient's blood sugars have been as high as 500 today. Patient has refused substitution for Crestor because of concerns of potential for liver damage similar to that which occurred in a family member who is on Lipitor.  Objective: Filed Vitals:   06/25/11 2205 06/26/11 0558 06/26/11 1007 06/26/11 1416  BP: 147/80 108/61 148/61 155/80  Pulse: 94 98  108  Temp: 97.3 F (36.3 C) 97.6 F (36.4 C)  98 F (36.7 C)  TempSrc: Oral Oral  Oral  Resp: 18 18  18   Height:      Weight:      SpO2: 92% 100%  99%   Weight change:   Intake/Output Summary (Last 24 hours) at 06/26/11 1806 Last data filed at 06/26/11 1416  Gross per 24 hour  Intake    480 ml  Output    900 ml  Net   -420 ml    General: Alert, awake, oriented x3, in no acute distress. Sitting up in the chair recently having walked in the halls. HEENT: [place]/AT [place], EOMI Neck: Trachea midline,  no masses, no thyromegal,y no JVD, no carotid bruit OROPHARYNX:  Moist, No exudate/ erythema/lesions.  Heart: Regular rate and rhythm, without murmurs, rubs, gallops, PMI non-displaced, no heaves or thrills on palpation.  Lungs: Clear to auscultation, no wheezing or rhonchi noted. No increased vocal fremitus resonant to percussion  Abdomen: Soft, nontender, nondistended, positive bowel sounds, no masses no hepatosplenomegaly noted..  Neuro: No focal neurological deficits noted cranial nerves II through XII grossly intact. DTRs 2+ bilaterally upper and lower extremities. Strength functional in bilateral upper and lower extremities. Musculoskeletal: No warm swelling or erythema around joints, no spinal tenderness noted. Psychiatric: Patient alert and oriented x3, good insight and cognition, good recent to remote recall.      Lab Results:  Basename 06/26/11 1224 06/26/11 0421 06/25/11  0330  NA -- 134* 138  K -- 4.8 4.8  CL -- 100 106  CO2 -- 24 21  GLUCOSE 488* 301* --  BUN -- 29* 29*  CREATININE -- 1.66* 1.75*  CALCIUM -- 9.8 9.7  MG -- -- --  PHOS -- -- --   No results found for this basename: AST:2,ALT:2,ALKPHOS:2,BILITOT:2,PROT:2,ALBUMIN:2 in the last 72 hours No results found for this basename: LIPASE:2,AMYLASE:2 in the last 72 hours  Basename 06/24/11 0400  WBC 8.0  NEUTROABS --  HGB 10.1*  HCT 31.3*  MCV 85.3  PLT 254   No results found for this basename: CKTOTAL:3,CKMB:3,CKMBINDEX:3,TROPONINI:3 in the last 72 hours No components found with this basename: POCBNP:3 No results found for this basename: DDIMER:2 in the last 72 hours No results found for this basename: HGBA1C:2 in the last 72 hours No results found for this basename: CHOL:2,HDL:2,LDLCALC:2,TRIG:2,CHOLHDL:2,LDLDIRECT:2 in the last 72 hours No results found for this basename: TSH,T4TOTAL,FREET3,T3FREE,THYROIDAB in the last 72 hours No results found for this basename: VITAMINB12:2,FOLATE:2,FERRITIN:2,TIBC:2,IRON:2,RETICCTPCT:2 in the last 72 hours  Micro Results: Recent Results (from the past 240 hour(s))  URINE CULTURE     Status: Normal   Collection Time   06/23/11  2:11 PM      Component Value Range Status Comment   Specimen Description URINE, CLEAN CATCH   Final    Special Requests NONE   Final    Culture  Setup Time [id number]   Final    Colony Count NO GROWTH  Final    Culture NO GROWTH   Final    Report Status 06/24/2011 FINAL   Final     Studies/Results: No results found.  Medications: I have reviewed the patient's current medications. Scheduled Meds:    . amLODipine  5 mg Oral Daily  . aspirin  325 mg Oral Daily  . cholecalciferol  2,000 Units Oral Daily  . insulin regular  0-10 Units Intravenous TID WC  . loratadine  10 mg Oral Daily  . vitamin B-12  500 mcg Oral Daily  . vitamin C with rose hips  500 mg Oral Daily  . DISCONTD: atorvastatin  40 mg Oral q1800    . DISCONTD: cefTRIAXone (ROCEPHIN)  IV  1 g Intravenous Q24H  . DISCONTD: hydrALAZINE  25 mg Oral Q8H  . DISCONTD: insulin aspart  0-15 Units Subcutaneous TID WC  . DISCONTD: insulin aspart  0-5 Units Subcutaneous QHS  . DISCONTD: insulin aspart  0-5 Units Subcutaneous QHS  . DISCONTD: insulin aspart  0-9 Units Subcutaneous TID WC  . DISCONTD: insulin aspart  4 Units Subcutaneous TID WC  . DISCONTD: insulin glargine  10 Units Subcutaneous QHS  . DISCONTD: insulin glargine  10 Units Subcutaneous Once  . DISCONTD: insulin glargine  15 Units Subcutaneous QHS   Continuous Infusions:    . sodium chloride 500 mL (06/26/11 1524)  . dextrose 5 % and 0.45% NaCl 500 mL (06/26/11 1430)  . insulin (NOVOLIN-R) infusion 6.1 Units/hr (06/26/11 1649)   PRN Meds:.acetaminophen, acetaminophen, dextrose, docusate sodium, ondansetron (ZOFRAN) IV, ondansetron, oxyCODONE-acetaminophen, polyethylene glycol Assessment/Plan: Patient Active Hospital Problem List: ARF (acute renal failure) (06/23/2011)   Assessment: Renal function continues to improve without any additional IV fluids. We'll continue to monitor renal function with evaluation of BUN creatinine. Her primary care physician's records patient has a baseline creatinine of 1.3 and a baseline BUN of 25.  Hypoglycemia associated with diabetes (06/23/2011)   Assessment: Patient has had no further hypoglycemia    DM (diabetes mellitus) (06/23/2011)   Assessment: Patient's blood sugars have not escalated to 500. I will check a metabolic profile and start the patient on glucose stabilizer.    HTN (hypertension) (06/23/2011)   Assessment: Patient's ACE receptor blocker has been discontinued because of her renal failure arrival to the hospital. However her blood pressures are elevated despite the hydralazine. In light of her diabetes this patient should either be an ACE inhibitor or a calcium channel blocker for best practice. I will add Norvasc 5 mg by mouth  daily to her current regimen and discontinue her hydralazine. Hyperlipidemia (06/23/2011)   Assessment: This patient's objections to formulary substitution I will hold statin while hospitalized..     Normocytic anemia (06/23/2011)   Assessment: Hemoglobin stable at 10.1    UTI (lower urinary tract infection) (06/23/2011)   Assessment: Urine cultures are negative and Rocephin was discontinued after 3 doses.      LOS: 3 days

## 2011-06-26 NOTE — Plan of Care (Signed)
Problem: Phase II Progression Outcomes Goal: Progress activity as tolerated unless otherwise ordered Outcome: Adequate for Discharge Ambulating frequently

## 2011-06-26 NOTE — Plan of Care (Signed)
Problem: Phase II Progression Outcomes Goal: Vital signs remain stable Outcome: Progressing CBG- Hyperglycemic

## 2011-06-26 NOTE — Plan of Care (Signed)
Problem: Phase II Progression Outcomes Goal: Vital signs remain stable Outcome: Progressing BP fluctuates

## 2011-06-27 LAB — BASIC METABOLIC PANEL
CO2: 22 mEq/L (ref 19–32)
Chloride: 102 mEq/L (ref 96–112)
GFR calc Af Amer: 44 mL/min — ABNORMAL LOW (ref >90)
Potassium: 4.9 mEq/L (ref 3.5–5.1)
Sodium: 135 mEq/L (ref 135–145)

## 2011-06-27 LAB — GLUCOSE, CAPILLARY
Glucose-Capillary: 148 mg/dL — ABNORMAL HIGH (ref 70–99)
Glucose-Capillary: 164 mg/dL — ABNORMAL HIGH (ref 70–99)
Glucose-Capillary: 167 mg/dL — ABNORMAL HIGH (ref 70–99)
Glucose-Capillary: 177 mg/dL — ABNORMAL HIGH (ref 70–99)
Glucose-Capillary: 213 mg/dL — ABNORMAL HIGH (ref 70–99)
Glucose-Capillary: 256 mg/dL — ABNORMAL HIGH (ref 70–99)

## 2011-06-27 MED ORDER — INSULIN GLARGINE 100 UNIT/ML ~~LOC~~ SOLN
20.0000 [IU] | Freq: Every day | SUBCUTANEOUS | Status: DC
  Administered 2011-06-27: 20 [IU] via SUBCUTANEOUS

## 2011-06-27 MED ORDER — INSULIN GLARGINE 100 UNIT/ML ~~LOC~~ SOLN
35.0000 [IU] | Freq: Every day | SUBCUTANEOUS | Status: DC
  Administered 2011-06-27: 35 [IU] via SUBCUTANEOUS

## 2011-06-27 MED ORDER — INSULIN ASPART 100 UNIT/ML ~~LOC~~ SOLN
3.0000 [IU] | Freq: Three times a day (TID) | SUBCUTANEOUS | Status: DC
  Administered 2011-06-27 (×2): 3 [IU] via SUBCUTANEOUS

## 2011-06-27 MED ORDER — INSULIN ASPART 100 UNIT/ML ~~LOC~~ SOLN
0.0000 [IU] | Freq: Three times a day (TID) | SUBCUTANEOUS | Status: DC
  Administered 2011-06-27: 5 [IU] via SUBCUTANEOUS
  Administered 2011-06-27 (×2): 8 [IU] via SUBCUTANEOUS
  Administered 2011-06-28: 15 [IU] via SUBCUTANEOUS
  Administered 2011-06-28: 3 [IU] via SUBCUTANEOUS

## 2011-06-27 MED ORDER — INSULIN ASPART 100 UNIT/ML ~~LOC~~ SOLN
0.0000 [IU] | Freq: Every day | SUBCUTANEOUS | Status: DC
  Administered 2011-06-27: 5 [IU] via SUBCUTANEOUS

## 2011-06-27 MED ORDER — INSULIN ASPART 100 UNIT/ML ~~LOC~~ SOLN
4.0000 [IU] | Freq: Three times a day (TID) | SUBCUTANEOUS | Status: DC
  Administered 2011-06-27 – 2011-06-28 (×3): 4 [IU] via SUBCUTANEOUS

## 2011-06-27 NOTE — Progress Notes (Signed)
Pt CBGs have been between 140-180 for four hours.  Notified MD.  Received new orders.  Will continue to monitor pt. Gunnar Bulla [place] 12:47 AM 06/27/2011

## 2011-06-27 NOTE — Progress Notes (Signed)
Subjective: Patient without complaints today. The patient's friends are at the bedside and they had cheerfully laughing in the room.  Interval history: Patient was transitioned off of the glucose stabilizer. Blood sugars are now ranging in the 250s.  Objective: Filed Vitals:   06/26/11 1416 06/26/11 2214 06/27/11 0651 06/27/11 1408  BP: 155/80 146/76 151/86 146/84  Pulse: 108 96 100 90  Temp: 98 F (36.7 C) 98.6 F (37 C) 98.2 F (36.8 C) 97.5 F (36.4 C)  TempSrc: Oral Oral Oral   Resp: 18 18 16 20   Height:      Weight:      SpO2: 99% 100% 98% 95%   Weight change:   Intake/Output Summary (Last 24 hours) at 06/27/11 1830 Last data filed at 06/27/11 1600  Gross per 24 hour  Intake 4375.92 ml  Output   2400 ml  Net 1975.92 ml    General: Alert, awake, oriented x3, in no acute distress. Sitting up in the chair recently having walked in the halls. HEENT: [place]/AT [place], EOMI Neck: Trachea midline,  no masses, no thyromegal,y no JVD, no carotid bruit OROPHARYNX:  Moist, No exudate/ erythema/lesions.  Lungs: Clear to auscultation, no wheezing or rhonchi noted. No increased vocal fremitus resonant to percussion  Abdomen: Soft, nontender, nondistended, positive bowel sounds, no masses no hepatosplenomegaly noted..  Neuro: No focal neurological deficits noted cranial nerves II through XII grossly intact. DTRs 2+ bilaterally upper and lower extremities. Strength functional in bilateral upper and lower extremities. Musculoskeletal: No warm swelling or erythema around joints, no spinal tenderness noted.       Lab Results:  Basename 06/27/11 0335 06/26/11 1224 06/26/11 0421  NA 135 -- 134*  K 4.9 -- 4.8  CL 102 -- 100  CO2 22 -- 24  GLUCOSE 193* 488* --  BUN 26* -- 29*  CREATININE 1.46* -- 1.66*  CALCIUM 9.7 -- 9.8  MG -- -- --  PHOS -- -- --   No results found for this basename: AST:2,ALT:2,ALKPHOS:2,BILITOT:2,PROT:2,ALBUMIN:2 in the last 72 hours No results found for  this basename: LIPASE:2,AMYLASE:2 in the last 72 hours No results found for this basename: WBC:2,NEUTROABS:2,HGB:2,HCT:2,MCV:2,PLT:2 in the last 72 hours No results found for this basename: CKTOTAL:3,CKMB:3,CKMBINDEX:3,TROPONINI:3 in the last 72 hours No components found with this basename: POCBNP:3 No results found for this basename: DDIMER:2 in the last 72 hours No results found for this basename: HGBA1C:2 in the last 72 hours No results found for this basename: CHOL:2,HDL:2,LDLCALC:2,TRIG:2,CHOLHDL:2,LDLDIRECT:2 in the last 72 hours No results found for this basename: TSH,T4TOTAL,FREET3,T3FREE,THYROIDAB in the last 72 hours No results found for this basename: VITAMINB12:2,FOLATE:2,FERRITIN:2,TIBC:2,IRON:2,RETICCTPCT:2 in the last 72 hours  Micro Results: Recent Results (from the past 240 hour(s))  URINE CULTURE     Status: Normal   Collection Time   06/23/11  2:11 PM      Component Value Range Status Comment   Specimen Description URINE, CLEAN CATCH   Final    Special Requests NONE   Final    Culture  Setup Time [id number]   Final    Colony Count NO GROWTH   Final    Culture NO GROWTH   Final    Report Status 06/24/2011 FINAL   Final     Studies/Results: No results found.  Medications: I have reviewed the patient's current medications. Scheduled Meds:    . amLODipine  5 mg Oral Daily  . aspirin  325 mg Oral Daily  . cholecalciferol  2,000 Units Oral Daily  . insulin aspart  0-15 Units Subcutaneous TID WC  . insulin aspart  0-5 Units Subcutaneous QHS  . insulin aspart  4 Units Subcutaneous TID WC  . insulin glargine  35 Units Subcutaneous QHS  . loratadine  10 mg Oral Daily  . vitamin B-12  500 mcg Oral Daily  . vitamin C with rose hips  500 mg Oral Daily  . DISCONTD: insulin aspart  3 Units Subcutaneous TID WC  . DISCONTD: insulin glargine  20 Units Subcutaneous QHS  . DISCONTD: insulin regular  0-10 Units Intravenous TID WC   Continuous Infusions:    . sodium  chloride 50 mL/hr at 06/27/11 1536  . DISCONTD: dextrose 5 % and 0.45% NaCl 125 mL/hr at 06/26/11 1922  . DISCONTD: insulin (NOVOLIN-R) infusion 3.2 mL/hr at 06/26/11 2356   PRN Meds:.acetaminophen, acetaminophen, dextrose, docusate sodium, ondansetron (ZOFRAN) IV, ondansetron, oxyCODONE-acetaminophen, polyethylene glycol Assessment/Plan: Patient Active Hospital Problem List: ARF (acute renal failure) (06/23/2011)   Assessment: Renal function continues to improve and is almost at her baseline of 1.3.  Hypoglycemia associated with diabetes (06/23/2011)   Assessment: Patient has had no further hypoglycemia    DM (diabetes mellitus) (06/23/2011)   Assessment: Patient's blood sugars have improved considerably she is now off the glucose stabilized. Her blood sugars are running in the mid-200s this I will increase her Lantus to 35 units at night.   HTN (hypertension) (06/23/2011)   Assessment: Patient's blood pressure is adequate but not optimal Norvasc will continue on current dose of Norvasc and continue to monitor.  Hyperlipidemia (06/23/2011)   Assessment: This patient's objections to formulary substitution I will hold statin while hospitalized..     Normocytic anemia (06/23/2011)   Assessment: Hemoglobin stable at 10.1    UTI (lower urinary tract infection) (06/23/2011)   Assessment: Urine cultures are negative and Rocephin was discontinued after 3 doses.      LOS: 4 days

## 2011-06-28 DIAGNOSIS — N179 Acute kidney failure, unspecified: Secondary | ICD-10-CM

## 2011-06-28 DIAGNOSIS — E1129 Type 2 diabetes mellitus with other diabetic kidney complication: Secondary | ICD-10-CM

## 2011-06-28 DIAGNOSIS — E1169 Type 2 diabetes mellitus with other specified complication: Secondary | ICD-10-CM

## 2011-06-28 LAB — GLUCOSE, CAPILLARY

## 2011-06-28 MED ORDER — INSULIN ASPART 100 UNIT/ML ~~LOC~~ SOLN: 4.0000 [IU] | Freq: Three times a day (TID) | SUBCUTANEOUS | Status: DC

## 2011-06-28 MED ORDER — INSULIN GLARGINE 100 UNIT/ML ~~LOC~~ SOLN: 40.0000 [IU] | Freq: Every day | SUBCUTANEOUS | Status: DC

## 2011-06-28 MED ORDER — AMLODIPINE BESYLATE 5 MG PO TABS: 5.0000 mg | ORAL_TABLET | Freq: Every day | ORAL | Status: DC

## 2011-06-28 MED ORDER — INSULIN ASPART 100 UNIT/ML ~~LOC~~ SOLN: 0.0000 [IU] | Freq: Three times a day (TID) | SUBCUTANEOUS | Status: DC

## 2011-06-28 NOTE — Discharge Summary (Signed)
Briana Garrison MRN: [id number] DOB/AGE: 1951-01-13 61 y.o.  Admit date: 06/23/2011 Discharge date: 06/28/2011  Primary Care Physician:  Horatio Pel, MD, MD   Discharge Diagnoses:   Patient Active Problem List  Diagnoses  . ARF (acute renal failure)  . Hypoglycemia associated with diabetes  . DM (diabetes mellitus)  . HTN (hypertension)  . Hyperlipidemia  . Normocytic anemia  . UTI (lower urinary tract infection)    DISCHARGE MEDICATION: Medication List  As of 06/28/2011 11:15 AM   STOP taking these medications         amoxicillin 500 MG capsule      EDARBYCLOR 40-25 MG Tabs      metFORMIN 1000 MG tablet         TAKE these medications         amLODipine 5 MG tablet   Commonly known as: NORVASC   Take 1 tablet (5 mg total) by mouth daily.      aspirin 325 MG tablet   Take 325 mg by mouth daily.      BYDUREON 2 MG Susr   Generic drug: Exenatide   Inject 2 mg into the skin once a week. On Thursday      cholecalciferol 1000 UNITS tablet   Commonly known as: VITAMIN D   Take 2,000 Units by mouth daily.      cloNIDine 0.1 MG tablet   Commonly known as: CATAPRES   Take 0.1 mg by mouth daily.      guaiFENesin 600 MG 12 hr tablet   Commonly known as: MUCINEX   Take 1,200 mg by mouth 2 (two) times daily.      HYDROcodone-acetaminophen 5-500 MG per tablet   Commonly known as: VICODIN   Take 1-2 tablets by mouth every 4 (four) hours as needed.      ibuprofen 200 MG tablet   Commonly known as: ADVIL,MOTRIN   Take 400 mg by mouth every 6 (six) hours as needed. For pain      insulin aspart 100 UNIT/ML injection   Commonly known as: novoLOG   Inject 0-15 Units into the skin 3 (three) times daily with meals. CBG 121 - 150: 2 units,151 - 200: 3 units, 201 - 250: 5 units, 251 - 300: 8 units, 301 - 350: 11 units, 351 - 400: 15 units      insulin aspart 100 UNIT/ML injection   Commonly known as: novoLOG   Inject 4 Units into the skin 3 (three) times daily with  meals.      insulin glargine 100 UNIT/ML injection   Commonly known as: LANTUS   Inject 40 Units into the skin at bedtime.      loratadine 10 MG tablet   Commonly known as: CLARITIN   Take 10 mg by mouth daily.      menthol-cetylpyridinium 3 MG lozenge   Commonly known as: CEPACOL   Take 1 lozenge by mouth as needed.      pseudoephedrine 120 MG 12 hr tablet   Commonly known as: SUDAFED   Take 120 mg by mouth every 12 (twelve) hours.      rosuvastatin 20 MG tablet   Commonly known as: CRESTOR   Take 20 mg by mouth daily.      Similasan Dry Eye Relief Soln   Apply 1 drop to eye every 12 (twelve) hours as needed. For allergy eyes      SIMILASAN EARACHE RELIEF OT   Place 1 drop in ear(s) daily. For left ear  vitamin B-12 500 MCG tablet   Commonly known as: CYANOCOBALAMIN   Take 500 mcg by mouth daily.      vitamin C with rose hips 500 MG tablet   Take 500 mg by mouth daily.              Consults:     SIGNIFICANT DIAGNOSTIC STUDIES:  No results found.   Recent Results (from the past 240 hour(s))  URINE CULTURE     Status: Normal   Collection Time   06/23/11  2:11 PM      Component Value Range Status Comment   Specimen Description URINE, CLEAN CATCH   Final    Special Requests NONE   Final    Culture  Setup Time [id number]   Final    Colony Count NO GROWTH   Final    Culture NO GROWTH   Final    Report Status 06/24/2011 FINAL   Final     BRIEF ADMITTING H & P: Briana Garrison is an 61 y.o. female with PMH of DM who presents to the hospital with a chief complaint of lower back and lower abdominal pain over the past 2 days. Her symptoms were associated with progressive weakness and altered mental status (mainly complaining of memory impairment). Has had some chills but no fever. No shortness of breath or cough. Poor appetite with some nausea and vomiting, but no diarrhea. No melena or hematochezia. Upon initial evaluation in the ER, the patient was noted to  have hypoglycemia and acute renal failure    Hospital Course:  Present on Admission:  .ARF (acute renal failure): Patient is to have acute renal failure which is felt to be secondary to dehydration given her history of vomiting and poor oral intake. As a result she had an acute on chronic renal failure which improved with hydration. Her renal function continues to improve and is almost back to normal. The patient had been on an ACE inhibitor prior to hospitalization this was discontinued for right now. Norvasc has been substituted for both hypertension and renal protection. I will defer to her primary care physician as to whether she should be started back on an ACE receptor blocker.   .Hypoglycemia associated with diabetes: On arrival to the hospital the patient had a hypoglycemic the blood sugar 39. This is likely a combination of dehydration and renal failure which contributed to her hypoglycemic state. As the patient recovered Lantus was reintroduced. The patient had originally been on 64 units at night and this was decreased to 40 units nightly with meal coverage and sliding scale.   .DM (diabetes mellitus): The patient had a hemoglobin A1c of 8.8 which demonstrates poor long-term control. I will defer to her primary care physician to titrate her medications on ongoing basis so as to obtain better control of her blood sugars.   Marland KitchenHTN (hypertension): The patient was started on Norvasc 5 mg. This seems to be affording her adequate although not optimal blood pressure control. Previously the patient had been on ACE receptor blocker and diuretic but this was discontinued secondary to her renal failure.   .Hyperlipidemia: The patient had been on Crestor previous to hospitalization this is been reinstated.    Marland KitchenUTI (lower urinary tract infection): The patient was originally thought to have a urinary tract infection based on the urinalysis. However urine cultures were negative. Nevertheless the patient  received 3 days of empiric antibiotics.  Disposition and Follow-up: Patient is to followup with her primary care physician  Dr. Shelia Media in one week  Discharge Orders    Future Appointments: Provider: Department: Dept Phone: Center:   08/25/2011 10:40 AM [facility] [id number] [facility]     Future Orders Please Complete By Expires   Diet Carb Modified      Call MD for:  persistant nausea and vomiting      Call MD for:  severe uncontrolled pain      Call MD for:  difficulty breathing, headache or visual disturbances      Call MD for:  persistant dizziness or light-headedness      Activity as tolerated - No restrictions         DISCHARGE EXAM:  General: Alert, awake, oriented x3, in no acute distress. Sitting up in the chair recently having walked in the halls.  Vital Signs:Blood pressure 120/65, pulse 77, temperature 98 F (36.7 C), temperature source Oral, resp. rate 16, height 5\' 1"  (1.549 m), weight 82.101 kg (181 lb), SpO2 98.00%. HEENT: [place]/AT PEERL, EOMI  Neck: Trachea midline, no masses, no thyromegal,y no JVD, no carotid bruit  OROPHARYNX: Moist, No exudate/ erythema/lesions.  Lungs: Clear to auscultation, no wheezing or rhonchi noted. No increased vocal fremitus resonant to percussion  Abdomen: Soft, nontender, nondistended, positive bowel sounds, no masses no hepatosplenomegaly noted..  Neuro: No focal neurological deficits noted cranial nerves II through XII grossly intact. DTRs 2+ bilaterally upper and lower extremities. Strength functional in bilateral upper and lower extremities.  Musculoskeletal: No warm swelling or erythema around joints, no spinal tenderness noted.   Basename 06/27/11 0335 06/26/11 1224 06/26/11 0421  NA 135 -- 134*  K 4.9 -- 4.8  CL 102 -- 100  CO2 22 -- 24  GLUCOSE 193* 488* --  BUN 26* -- 29*  CREATININE 1.46* -- 1.66*  CALCIUM 9.7 -- 9.8  MG -- -- --  PHOS -- -- --   No results found for this basename:  AST:2,ALT:2,ALKPHOS:2,BILITOT:2,PROT:2,ALBUMIN:2 in the last 72 hours No results found for this basename: LIPASE:2,AMYLASE:2 in the last 72 hours No results found for this basename: WBC:2,NEUTROABS:2,HGB:2,HCT:2,MCV:2,PLT:2 in the last 72 hours  Total time for discharge process including face-to-face time approximately 40 minutes Signed: Lavontae Cornia A. 06/28/2011, 11:15 AM

## 2011-06-28 NOTE — Progress Notes (Signed)
Patient has been given all discharge instructions and education. Expressed understanding of all prescriptions and orders. Patient will discharge home with family and will follow-up with Dr. Shelia Media on 06/30/11.

## 2011-08-25 ENCOUNTER — Ambulatory Visit: Payer: BC Managed Care – PPO

## 2011-10-27 ENCOUNTER — Other Ambulatory Visit (HOSPITAL_COMMUNITY): Payer: Self-pay | Admitting: Internal Medicine

## 2012-06-18 ENCOUNTER — Ambulatory Visit (HOSPITAL_COMMUNITY)
Admission: RE | Admit: 2012-06-18 | Discharge: 2012-06-18 | Disposition: A | Discharge status: Home / Self Care | Payer: BC Managed Care – PPO | Source: Ambulatory Visit | Attending: Urology | Admitting: Urology

## 2012-06-18 ENCOUNTER — Other Ambulatory Visit (HOSPITAL_COMMUNITY): Payer: Self-pay | Admitting: Urology

## 2012-06-18 DIAGNOSIS — C649 Malignant neoplasm of unspecified kidney, except renal pelvis: Secondary | ICD-10-CM

## 2012-06-18 DIAGNOSIS — I10 Essential (primary) hypertension: Secondary | ICD-10-CM | POA: Insufficient documentation

## 2013-07-06 ENCOUNTER — Other Ambulatory Visit: Payer: Self-pay

## 2013-07-06 DIAGNOSIS — Z1231 Encounter for screening mammogram for malignant neoplasm of breast: Secondary | ICD-10-CM

## 2013-07-22 ENCOUNTER — Ambulatory Visit
Admission: RE | Admit: 2013-07-22 | Discharge: 2013-07-22 | Disposition: A | Discharge status: Home / Self Care | Payer: BC Managed Care – PPO | Source: Ambulatory Visit

## 2013-07-22 ENCOUNTER — Encounter (INDEPENDENT_AMBULATORY_CARE_PROVIDER_SITE_OTHER): Payer: Self-pay

## 2013-07-22 DIAGNOSIS — Z1231 Encounter for screening mammogram for malignant neoplasm of breast: Secondary | ICD-10-CM

## 2014-08-14 ENCOUNTER — Other Ambulatory Visit: Payer: Self-pay

## 2014-08-14 DIAGNOSIS — Z1231 Encounter for screening mammogram for malignant neoplasm of breast: Secondary | ICD-10-CM

## 2014-08-24 ENCOUNTER — Ambulatory Visit: Payer: BC Managed Care – PPO

## 2014-11-24 ENCOUNTER — Ambulatory Visit
Admission: RE | Admit: 2014-11-24 | Discharge: 2014-11-24 | Disposition: A | Discharge status: Home / Self Care | Payer: BC Managed Care – PPO | Source: Ambulatory Visit

## 2014-11-24 DIAGNOSIS — Z1231 Encounter for screening mammogram for malignant neoplasm of breast: Secondary | ICD-10-CM

## 2015-11-06 ENCOUNTER — Other Ambulatory Visit: Payer: Self-pay | Admitting: Internal Medicine

## 2015-11-06 DIAGNOSIS — R198 Other specified symptoms and signs involving the digestive system and abdomen: Secondary | ICD-10-CM

## 2015-11-06 DIAGNOSIS — R1012 Left upper quadrant pain: Secondary | ICD-10-CM

## 2015-11-08 ENCOUNTER — Ambulatory Visit
Admission: RE | Admit: 2015-11-08 | Discharge: 2015-11-08 | Disposition: A | Discharge status: Home / Self Care | Payer: Medicare Other | Source: Ambulatory Visit | Attending: Internal Medicine | Admitting: Internal Medicine

## 2015-11-08 DIAGNOSIS — R1012 Left upper quadrant pain: Secondary | ICD-10-CM

## 2015-11-08 DIAGNOSIS — R198 Other specified symptoms and signs involving the digestive system and abdomen: Secondary | ICD-10-CM

## 2015-11-08 MED ORDER — IOPAMIDOL (ISOVUE-300) INJECTION 61%
100.0000 mL | Freq: Once | INTRAVENOUS | Status: AC | PRN
Start: 2015-11-08 — End: 2015-11-08
  Administered 2015-11-08: 100 mL via INTRAVENOUS

## 2016-04-10 LAB — COLOGUARD

## 2017-02-05 ENCOUNTER — Emergency Department (HOSPITAL_COMMUNITY): Payer: Medicare Other

## 2017-02-05 ENCOUNTER — Emergency Department (HOSPITAL_COMMUNITY)
Admission: EM | Admit: 2017-02-05 | Discharge: 2017-02-06 | Disposition: A | Discharge status: Home / Self Care | Payer: Medicare Other | Source: Home / Self Care | Attending: Emergency Medicine | Admitting: Emergency Medicine

## 2017-02-05 ENCOUNTER — Encounter (HOSPITAL_COMMUNITY): Payer: Self-pay | Admitting: Emergency Medicine

## 2017-02-05 ENCOUNTER — Other Ambulatory Visit: Payer: Self-pay

## 2017-02-05 DIAGNOSIS — R1031 Right lower quadrant pain: Secondary | ICD-10-CM

## 2017-02-05 DIAGNOSIS — Z794 Long term (current) use of insulin: Secondary | ICD-10-CM | POA: Insufficient documentation

## 2017-02-05 DIAGNOSIS — K760 Fatty (change of) liver, not elsewhere classified: Secondary | ICD-10-CM | POA: Diagnosis not present

## 2017-02-05 DIAGNOSIS — N179 Acute kidney failure, unspecified: Secondary | ICD-10-CM | POA: Diagnosis not present

## 2017-02-05 DIAGNOSIS — Z8619 Personal history of other infectious and parasitic diseases: Secondary | ICD-10-CM | POA: Insufficient documentation

## 2017-02-05 DIAGNOSIS — Z888 Allergy status to other drugs, medicaments and biological substances status: Secondary | ICD-10-CM | POA: Diagnosis not present

## 2017-02-05 DIAGNOSIS — E119 Type 2 diabetes mellitus without complications: Secondary | ICD-10-CM

## 2017-02-05 DIAGNOSIS — Z800 Family history of malignant neoplasm of digestive organs: Secondary | ICD-10-CM | POA: Diagnosis not present

## 2017-02-05 DIAGNOSIS — R109 Unspecified abdominal pain: Secondary | ICD-10-CM

## 2017-02-05 DIAGNOSIS — K5900 Constipation, unspecified: Secondary | ICD-10-CM

## 2017-02-05 DIAGNOSIS — N183 Chronic kidney disease, stage 3 (moderate): Secondary | ICD-10-CM | POA: Diagnosis not present

## 2017-02-05 DIAGNOSIS — I129 Hypertensive chronic kidney disease with stage 1 through stage 4 chronic kidney disease, or unspecified chronic kidney disease: Secondary | ICD-10-CM | POA: Diagnosis not present

## 2017-02-05 DIAGNOSIS — I10 Essential (primary) hypertension: Secondary | ICD-10-CM

## 2017-02-05 DIAGNOSIS — K861 Other chronic pancreatitis: Secondary | ICD-10-CM | POA: Insufficient documentation

## 2017-02-05 DIAGNOSIS — Z7982 Long term (current) use of aspirin: Secondary | ICD-10-CM

## 2017-02-05 DIAGNOSIS — Z9049 Acquired absence of other specified parts of digestive tract: Secondary | ICD-10-CM | POA: Insufficient documentation

## 2017-02-05 DIAGNOSIS — K219 Gastro-esophageal reflux disease without esophagitis: Secondary | ICD-10-CM | POA: Diagnosis not present

## 2017-02-05 DIAGNOSIS — R740 Nonspecific elevation of levels of transaminase and lactic acid dehydrogenase [LDH]: Secondary | ICD-10-CM | POA: Diagnosis not present

## 2017-02-05 DIAGNOSIS — R110 Nausea: Secondary | ICD-10-CM

## 2017-02-05 DIAGNOSIS — R7989 Other specified abnormal findings of blood chemistry: Secondary | ICD-10-CM | POA: Insufficient documentation

## 2017-02-05 DIAGNOSIS — K8050 Calculus of bile duct without cholangitis or cholecystitis without obstruction: Secondary | ICD-10-CM | POA: Diagnosis present

## 2017-02-05 DIAGNOSIS — I700 Atherosclerosis of aorta: Secondary | ICD-10-CM | POA: Insufficient documentation

## 2017-02-05 DIAGNOSIS — D3501 Benign neoplasm of right adrenal gland: Secondary | ICD-10-CM | POA: Diagnosis not present

## 2017-02-05 DIAGNOSIS — Z85528 Personal history of other malignant neoplasm of kidney: Secondary | ICD-10-CM | POA: Insufficient documentation

## 2017-02-05 DIAGNOSIS — E7800 Pure hypercholesterolemia, unspecified: Secondary | ICD-10-CM | POA: Diagnosis not present

## 2017-02-05 DIAGNOSIS — Z79899 Other long term (current) drug therapy: Secondary | ICD-10-CM | POA: Insufficient documentation

## 2017-02-05 DIAGNOSIS — E1122 Type 2 diabetes mellitus with diabetic chronic kidney disease: Secondary | ICD-10-CM | POA: Diagnosis not present

## 2017-02-05 LAB — CBC
HEMATOCRIT: 34.8 % — AB (ref 36.0–46.0)
HEMOGLOBIN: 11.6 g/dL — AB (ref 12.0–15.0)
MCH: 28.6 pg (ref 26.0–34.0)
MCHC: 33.3 g/dL (ref 30.0–36.0)
MCV: 85.7 fL (ref 78.0–100.0)
Platelets: 281 10*3/uL (ref 150–400)
RBC: 4.06 MIL/uL (ref 3.87–5.11)
RDW: 13.3 % (ref 11.5–15.5)
WBC: 7.5 10*3/uL (ref 4.0–10.5)

## 2017-02-05 LAB — COMPREHENSIVE METABOLIC PANEL
ALBUMIN: 3.8 g/dL (ref 3.5–5.0)
ALT: 25 U/L (ref 14–54)
ANION GAP: 6 (ref 5–15)
AST: 16 U/L (ref 15–41)
Alkaline Phosphatase: 86 U/L (ref 38–126)
BILIRUBIN TOTAL: 0.4 mg/dL (ref 0.3–1.2)
BUN: 49 mg/dL — ABNORMAL HIGH (ref 6–20)
CO2: 25 mmol/L (ref 22–32)
Calcium: 9.8 mg/dL (ref 8.9–10.3)
Chloride: 102 mmol/L (ref 101–111)
Creatinine, Ser: 2.07 mg/dL — ABNORMAL HIGH (ref 0.44–1.00)
GFR calc Af Amer: 28 mL/min — ABNORMAL LOW (ref >60)
GFR calc non Af Amer: 24 mL/min — ABNORMAL LOW (ref >60)
GLUCOSE: 239 mg/dL — AB (ref 65–99)
POTASSIUM: 4.6 mmol/L (ref 3.5–5.1)
SODIUM: 133 mmol/L — AB (ref 135–145)
TOTAL PROTEIN: 7.5 g/dL (ref 6.5–8.1)

## 2017-02-05 LAB — LIPASE, BLOOD: Lipase: 26 U/L (ref 11–51)

## 2017-02-05 LAB — URINALYSIS, ROUTINE W REFLEX MICROSCOPIC
BILIRUBIN URINE: NEGATIVE
GLUCOSE, UA: 150 mg/dL — AB
Hgb urine dipstick: NEGATIVE
KETONES UR: NEGATIVE mg/dL
NITRITE: NEGATIVE
PROTEIN: 30 mg/dL — AB
Specific Gravity, Urine: 1.013 (ref 1.005–1.030)
pH: 5 (ref 5.0–8.0)

## 2017-02-05 LAB — CBG MONITORING, ED: Glucose-Capillary: 69 mg/dL (ref 65–99)

## 2017-02-05 MED ORDER — SODIUM CHLORIDE 0.9 % IV BOLUS (SEPSIS)
1000.0000 mL | Freq: Once | INTRAVENOUS | Status: AC
  Administered 2017-02-05: 1000 mL via INTRAVENOUS

## 2017-02-05 NOTE — ED Provider Notes (Signed)
[facility] DEPT Provider Note   CSN: [id number] Arrival date & time: 02/05/17  1334     History   Chief Complaint Chief Complaint  Patient presents with  . Abdominal Pain    HPI Briana Garrison is a 67 y.o. female.  HPI  67 year old female with a history of kidney cancer, diabetes, and hypertension presents with right flank pain.  It radiates around to her right lower abdomen.  Started around 9 AM.  It was most severe when it first started and was about an 8/10.  She took some Aleve at around noon and this is helped the pain.  Now it is about a 3 out of 10.  It has been constant since starting.  Some pain with getting up and walking.  She felt nauseated first but no vomiting.  No diarrhea.  She does have chronic constipation.  She denies any urinary symptoms such as dysuria or hematuria.  No fevers.  She has a history of chronic kidney disease but does not know what her creatinine is.  Past Medical History:  Diagnosis Date  . Cancer of kidney ([place]) 05/14/2009  . Diabetes mellitus   . Endometrial mass   . GERD (gastroesophageal reflux disease)   . Hypercholesterolemia   . Hypertension   . Internal hemorrhoids    S/P colonoscopy 06/10/00 by Dr. Lajoyce Corners  . Shingles     Patient Active Problem List   Diagnosis Date Noted  . ARF (acute renal failure) ([place]) 06/23/2011  . Hypoglycemia associated with diabetes ([place]) 06/23/2011  . DM (diabetes mellitus) ([place]) 06/23/2011  . HTN (hypertension) 06/23/2011  . Hyperlipidemia 06/23/2011  . Normocytic anemia 06/23/2011  . UTI (lower urinary tract infection) 06/23/2011    Past Surgical History:  Procedure Laterality Date  . CHOLECYSTECTOMY  11/13/08  . CYSTOSCOPY KIDNEY W/ URETERAL GUIDE WIRE  05/14/09  . Diagnostic hysteroscopy, D&C, endometrial polypectomy  07/01/2005  . LIPECTOMY      OB History    No data available       Home Medications    Prior to Admission medications   Medication Sig Start  Date End Date Taking? Authorizing Provider  aspirin 81 MG chewable tablet Chew 81 mg by mouth daily.   Yes [provider]  cholecalciferol (VITAMIN D) 1000 UNITS tablet Take 1,000 Units by mouth daily.    Yes [provider]  cloNIDine (CATAPRES) 0.1 MG tablet Take 0.1-0.2 mg by mouth 2 (two) times daily. 0.1 MG in AM and 0.2 in  PM   Yes [provider]  diltiazem (CARDIZEM CD) 180 MG 24 hr capsule Take 180 mg by mouth daily.   Yes [provider]  glipiZIDE (GLUCOTROL XL) 5 MG 24 hr tablet Take 5 mg by mouth daily with breakfast.   Yes [provider]  ibuprofen (ADVIL,MOTRIN) 200 MG tablet Take 400 mg by mouth every 6 (six) hours as needed. For pain   Yes [provider]  insulin glargine (LANTUS) 100 UNIT/ML injection Inject 40 Units into the skin at bedtime. Patient taking differently: Inject 64 Units into the skin at bedtime.  06/28/11  Yes Leana Gamer, MD  insulin lispro (HUMALOG) 100 UNIT/ML KiwkPen Inject 14-18 Units into the skin 3 (three) times daily. 14 units with breakfast and 18 units with supper   Yes [provider]  rosuvastatin (CRESTOR) 20 MG tablet Take 20 mg by mouth daily.   Yes [provider]  telmisartan-hydrochlorothiazide (MICARDIS HCT) 80-25 MG  tablet Take 1 tablet by mouth daily.   Yes [provider]  vitamin B-12 (CYANOCOBALAMIN) 500 MCG tablet Take 500 mcg by mouth daily.   Yes [provider]  amLODipine (NORVASC) 5 MG tablet Take 1 tablet (5 mg total) by mouth daily. Patient not taking: Reported on 02/05/2017 06/28/11 06/27/12  Leana Gamer, MD  insulin aspart (NOVOLOG) 100 UNIT/ML injection Inject 0-15 Units into the skin 3 (three) times daily with meals. CBG 121 - 150: 2 units,151 - 200: 3 units, 201 - 250: 5 units, 251 - 300: 8 units, 301 - 350: 11 units, 351 - 400: 15 units Patient not taking: Reported on 02/05/2017 06/28/11 06/27/12  Leana Gamer, MD  insulin  aspart (NOVOLOG) 100 UNIT/ML injection Inject 4 Units into the skin 3 (three) times daily with meals. Patient not taking: Reported on 02/05/2017 06/28/11 06/27/12  Leana Gamer, MD    Family History Family History  Problem Relation Age of Onset  . Ovarian cancer Mother   . Stroke Mother   . Stroke Father   . Multiple sclerosis Sister   . Stroke Brother     Social History Social History   Tobacco Use  . Smoking status: Never Smoker  Substance Use Topics  . Alcohol use: No  . Drug use: No     Allergies   Amlodipine   Review of Systems Review of Systems  Constitutional: Negative for fever.  Gastrointestinal: Positive for abdominal pain, constipation and nausea. Negative for diarrhea and vomiting.  Genitourinary: Positive for flank pain. Negative for dysuria and hematuria.  All other systems reviewed and are negative.    Physical Exam Updated Vital Signs BP (!) 174/71 (BP Location: Right Arm)   Pulse 72   Temp 98.2 F (36.8 C) (Oral)   Resp 16   SpO2 96%   Physical Exam  Constitutional: She is oriented to person, place, and time. She appears well-developed and well-nourished.  Non-toxic appearance. She does not appear ill. No distress.  obese  HENT:  Head: Normocephalic and atraumatic.  Right Ear: External ear normal.  Left Ear: External ear normal.  Nose: Nose normal.  Eyes: Right eye exhibits no discharge. Left eye exhibits no discharge.  Cardiovascular: Normal rate, regular rhythm and normal heart sounds.  Pulmonary/Chest: Effort normal and breath sounds normal.  Abdominal: Soft. There is no tenderness. There is CVA tenderness (right).  Neurological: She is alert and oriented to person, place, and time.  Skin: Skin is warm and dry.  Nursing note and vitals reviewed.    ED Treatments / Results  Labs (all labs ordered are listed, but only abnormal results are displayed) Labs Reviewed  COMPREHENSIVE METABOLIC PANEL - Abnormal; Notable for the  following components:      Result Value   Sodium 133 (*)    Glucose, Bld 239 (*)    BUN 49 (*)    Creatinine, Ser 2.07 (*)    GFR calc non Af Amer 24 (*)    GFR calc Af Amer 28 (*)    All other components within normal limits  CBC - Abnormal; Notable for the following components:   Hemoglobin 11.6 (*)    HCT 34.8 (*)    All other components within normal limits  URINALYSIS, ROUTINE W REFLEX MICROSCOPIC - Abnormal; Notable for the following components:   Glucose, UA 150 (*)    Protein, ur 30 (*)    Leukocytes, UA SMALL (*)    Bacteria, UA RARE (*)  Squamous Epithelial / LPF 0-5 (*)    All other components within normal limits  LIPASE, BLOOD  CBG MONITORING, ED    EKG  EKG Interpretation None       Radiology Ct Renal Stone Study  Result Date: 02/05/2017 CLINICAL DATA:  67 year old female with right flank pain concern for kidney stone. History of left lower pole renal cell carcinoma treated with wedge resection in April 2011. EXAM: CT ABDOMEN AND PELVIS WITHOUT CONTRAST TECHNIQUE: Multidetector CT imaging of the abdomen and pelvis was performed following the standard protocol without IV contrast. COMPARISON:  Abdominal CT dated 11/08/2015 FINDINGS: Evaluation of this exam is limited in the absence of intravenous contrast. Lower chest: There are bibasilar linear atelectasis/scarring. There is a 6 mm right upper lobe subpleural nodule (series 6, image 6) similar to CT study of 06/18/2012. No intra-abdominal free air or free fluid. Hepatobiliary: The liver is unremarkable.  Cholecystectomy. Pancreas: Unremarkable. No pancreatic ductal dilatation or surrounding inflammatory changes. Spleen: Normal in size without focal abnormality. Adrenals/Urinary Tract: There is a 14 mm right adrenal adenoma. The left adrenal gland is unremarkable. Scattered punctate calcific densities in the kidneys bilaterally may represent small nonobstructing stones versus vascular calcification. Postsurgical  changes of the inferior pole of the left kidney with small linear probable dystrophic calcification. There is no hydronephrosis on either side. Faint small bilateral renal hypodense lesions are not characterized on this noncontrast CT but appears similar to prior CT. The visualized ureters and urinary bladder appear unremarkable. Stomach/Bowel: There is no bowel obstruction or active inflammation. There is scattered colonic diverticula without active inflammatory changes the appendix is normal. Vascular/Lymphatic: Mild aortoiliac atherosclerotic disease. No portal venous gas. There is no adenopathy. Reproductive: The uterus is anteverted. There is probable small uterine fibroid from the anterior body with indentation of the posterior bladder wall. Bilateral tubal ligation clips noted. Other: None Musculoskeletal: No acute osseous pathology. IMPRESSION: 1. No hydronephrosis or obstructing stone probable punctate nonobstructing bilateral renal calculi. 2. Postsurgical changes of wedge resection of the inferior pole of the left kidney similar to prior CT 3. Multiple small scattered colonic diverticula without active inflammatory changes. No bowel obstruction. Normal appendix. 4. Mild Aortic Atherosclerosis (ICD10-I70.0). Electronically Signed   By: Anner Crete M.D.   On: 02/05/2017 22:55    Procedures Procedures (including critical care time)  Medications Ordered in ED Medications  sodium chloride 0.9 % bolus 1,000 mL (1,000 mLs Intravenous New Bag/Given 02/05/17 2234)     Initial Impression / Assessment and Plan / ED Course  I have reviewed the triage vital signs and the nursing notes.  Pertinent labs & imaging results that were available during my care of the patient were reviewed by me and considered in my medical decision making (see chart for details).     Patient overall appears well.  Her urinalysis is not consistent with UTI or kidney infection.  Her CT scan does not show an obvious cause  for right flank pain.  This is likely muscular as it does hurt with certain movements, walking, and twisting.  Of note, her creatinine is 2.07.  She was given a bolus of IV fluids.  She has not had any significant reason to have renal failure such as dehydration.  His last creatinine was 1.46 from 2013.  I discussed that this could be a new or chronic issue for her given the 5-1/2-year gap.  Discussed she should call her PCP tomorrow and let them know of the values obtained tonight  for further outpatient workup.  She does not appear acutely dehydrated.  Her vitals are unremarkable besides hypertension.  She will need this further monitored by her PCP.  Discharge home with return precautions.  Final Clinical Impressions(s) / ED Diagnoses   Final diagnoses:  Right flank pain    ED Discharge Orders    None       Sherwood Gambler, MD 02/05/17 2348

## 2017-02-05 NOTE — ED Notes (Signed)
Pt was shaky and thought her blood sugar was low, took her CBG and was 69. Gave patient OJ and some graham crackers and pb after that.

## 2017-02-05 NOTE — Discharge Instructions (Signed)
Your BUN and Creatinine are 49 and 2.07. Call your doctor to check your most recent bloodwork to compare. If you develop worsening, continued or recurrent pain or vomiting, fevers or other new/concerning symptoms then return to the ER or see your primary care doctor.

## 2017-02-05 NOTE — ED Triage Notes (Signed)
Pt complaint of right abdominal/flank pain onset 0900; associated nausea; denies GU symptoms.

## 2017-02-06 ENCOUNTER — Observation Stay (HOSPITAL_COMMUNITY)
Admission: EM | Admit: 2017-02-06 | Discharge: 2017-02-08 | Disposition: A | Discharge status: Home / Self Care | Payer: Medicare Other | Attending: Internal Medicine | Admitting: Internal Medicine

## 2017-02-06 ENCOUNTER — Encounter (HOSPITAL_COMMUNITY): Payer: Self-pay | Admitting: Nurse Practitioner

## 2017-02-06 DIAGNOSIS — K8050 Calculus of bile duct without cholangitis or cholecystitis without obstruction: Secondary | ICD-10-CM

## 2017-02-06 DIAGNOSIS — K8051 Calculus of bile duct without cholangitis or cholecystitis with obstruction: Secondary | ICD-10-CM | POA: Diagnosis present

## 2017-02-06 DIAGNOSIS — R945 Abnormal results of liver function studies: Secondary | ICD-10-CM

## 2017-02-06 DIAGNOSIS — E119 Type 2 diabetes mellitus without complications: Secondary | ICD-10-CM

## 2017-02-06 DIAGNOSIS — R7989 Other specified abnormal findings of blood chemistry: Secondary | ICD-10-CM

## 2017-02-06 DIAGNOSIS — I10 Essential (primary) hypertension: Secondary | ICD-10-CM | POA: Diagnosis present

## 2017-02-06 DIAGNOSIS — N179 Acute kidney failure, unspecified: Secondary | ICD-10-CM | POA: Diagnosis present

## 2017-02-06 DIAGNOSIS — R52 Pain, unspecified: Secondary | ICD-10-CM

## 2017-02-06 DIAGNOSIS — E1169 Type 2 diabetes mellitus with other specified complication: Secondary | ICD-10-CM

## 2017-02-06 LAB — COMPREHENSIVE METABOLIC PANEL
ALT: 872 U/L — AB (ref 14–54)
AST: 721 U/L — ABNORMAL HIGH (ref 15–41)
Albumin: 4.1 g/dL (ref 3.5–5.0)
Alkaline Phosphatase: 139 U/L — ABNORMAL HIGH (ref 38–126)
Anion gap: 8 (ref 5–15)
BUN: 37 mg/dL — ABNORMAL HIGH (ref 6–20)
CHLORIDE: 104 mmol/L (ref 101–111)
CO2: 26 mmol/L (ref 22–32)
Calcium: 10.3 mg/dL (ref 8.9–10.3)
Creatinine, Ser: 1.51 mg/dL — ABNORMAL HIGH (ref 0.44–1.00)
GFR, EST AFRICAN AMERICAN: 40 mL/min — AB (ref >60)
GFR, EST NON AFRICAN AMERICAN: 35 mL/min — AB (ref >60)
Glucose, Bld: 116 mg/dL — ABNORMAL HIGH (ref 65–99)
Potassium: 4.1 mmol/L (ref 3.5–5.1)
Sodium: 138 mmol/L (ref 135–145)
TOTAL PROTEIN: 8.5 g/dL — AB (ref 6.5–8.1)
Total Bilirubin: 0.7 mg/dL (ref 0.3–1.2)

## 2017-02-06 LAB — URINALYSIS, ROUTINE W REFLEX MICROSCOPIC
BILIRUBIN URINE: NEGATIVE
GLUCOSE, UA: 50 mg/dL — AB
HGB URINE DIPSTICK: NEGATIVE
KETONES UR: NEGATIVE mg/dL
NITRITE: NEGATIVE
PH: 5 (ref 5.0–8.0)
Protein, ur: 100 mg/dL — AB
Specific Gravity, Urine: 1.016 (ref 1.005–1.030)

## 2017-02-06 LAB — CBC
HCT: 36.4 % (ref 36.0–46.0)
Hemoglobin: 12.2 g/dL (ref 12.0–15.0)
MCH: 28.8 pg (ref 26.0–34.0)
MCHC: 33.5 g/dL (ref 30.0–36.0)
MCV: 86.1 fL (ref 78.0–100.0)
Platelets: 305 10*3/uL (ref 150–400)
RBC: 4.23 MIL/uL (ref 3.87–5.11)
RDW: 13.5 % (ref 11.5–15.5)
WBC: 7.8 10*3/uL (ref 4.0–10.5)

## 2017-02-06 LAB — LIPASE, BLOOD: LIPASE: 20 U/L (ref 11–51)

## 2017-02-06 NOTE — ED Triage Notes (Signed)
Pt is c/o 6-7 episodes of emesis and right flank pain. She returns today per discharge and return precautions given yesterday after she was evaluated for the flank pain.

## 2017-02-07 ENCOUNTER — Inpatient Hospital Stay (HOSPITAL_COMMUNITY): Payer: Medicare Other

## 2017-02-07 ENCOUNTER — Other Ambulatory Visit: Payer: Self-pay

## 2017-02-07 ENCOUNTER — Emergency Department (HOSPITAL_COMMUNITY): Payer: Medicare Other

## 2017-02-07 DIAGNOSIS — N179 Acute kidney failure, unspecified: Secondary | ICD-10-CM | POA: Diagnosis present

## 2017-02-07 DIAGNOSIS — E118 Type 2 diabetes mellitus with unspecified complications: Secondary | ICD-10-CM

## 2017-02-07 DIAGNOSIS — I10 Essential (primary) hypertension: Secondary | ICD-10-CM

## 2017-02-07 DIAGNOSIS — R945 Abnormal results of liver function studies: Secondary | ICD-10-CM

## 2017-02-07 DIAGNOSIS — Z794 Long term (current) use of insulin: Secondary | ICD-10-CM | POA: Diagnosis not present

## 2017-02-07 DIAGNOSIS — K8051 Calculus of bile duct without cholangitis or cholecystitis with obstruction: Secondary | ICD-10-CM | POA: Diagnosis not present

## 2017-02-07 DIAGNOSIS — K8050 Calculus of bile duct without cholangitis or cholecystitis without obstruction: Secondary | ICD-10-CM

## 2017-02-07 LAB — COMPREHENSIVE METABOLIC PANEL
ALT: 619 U/L — AB (ref 14–54)
AST: 392 U/L — AB (ref 15–41)
Albumin: 3.4 g/dL — ABNORMAL LOW (ref 3.5–5.0)
Alkaline Phosphatase: 115 U/L (ref 38–126)
Anion gap: 7 (ref 5–15)
BUN: 28 mg/dL — AB (ref 6–20)
CHLORIDE: 107 mmol/L (ref 101–111)
CO2: 24 mmol/L (ref 22–32)
CREATININE: 1.41 mg/dL — AB (ref 0.44–1.00)
Calcium: 9.2 mg/dL (ref 8.9–10.3)
GFR calc Af Amer: 44 mL/min — ABNORMAL LOW (ref >60)
GFR, EST NON AFRICAN AMERICAN: 38 mL/min — AB (ref >60)
Glucose, Bld: 127 mg/dL — ABNORMAL HIGH (ref 65–99)
POTASSIUM: 4 mmol/L (ref 3.5–5.1)
SODIUM: 138 mmol/L (ref 135–145)
Total Bilirubin: 0.4 mg/dL (ref 0.3–1.2)
Total Protein: 6.8 g/dL (ref 6.5–8.1)

## 2017-02-07 LAB — CBC
HCT: 31.7 % — ABNORMAL LOW (ref 36.0–46.0)
Hemoglobin: 10.6 g/dL — ABNORMAL LOW (ref 12.0–15.0)
MCH: 29.2 pg (ref 26.0–34.0)
MCHC: 33.4 g/dL (ref 30.0–36.0)
MCV: 87.3 fL (ref 78.0–100.0)
PLATELETS: 246 10*3/uL (ref 150–400)
RBC: 3.63 MIL/uL — AB (ref 3.87–5.11)
RDW: 13.7 % (ref 11.5–15.5)
WBC: 7.4 10*3/uL (ref 4.0–10.5)

## 2017-02-07 LAB — GLUCOSE, CAPILLARY
GLUCOSE-CAPILLARY: 119 mg/dL — AB (ref 65–99)
GLUCOSE-CAPILLARY: 134 mg/dL — AB (ref 65–99)
GLUCOSE-CAPILLARY: 204 mg/dL — AB (ref 65–99)
Glucose-Capillary: 138 mg/dL — ABNORMAL HIGH (ref 65–99)
Glucose-Capillary: 204 mg/dL — ABNORMAL HIGH (ref 65–99)
Glucose-Capillary: 102 mg/dL — ABNORMAL HIGH (ref 65–99)

## 2017-02-07 LAB — ACETAMINOPHEN LEVEL

## 2017-02-07 MED ORDER — INSULIN GLARGINE 100 UNIT/ML ~~LOC~~ SOLN
30.0000 [IU] | Freq: Every day | SUBCUTANEOUS | Status: DC
  Administered 2017-02-07 (×2): 30 [IU] via SUBCUTANEOUS
  Filled 2017-02-07 (×3): qty 0.3

## 2017-02-07 MED ORDER — ONDANSETRON HCL 4 MG/2ML IJ SOLN
4.0000 mg | Freq: Once | INTRAMUSCULAR | Status: AC
  Administered 2017-02-07: 4 mg via INTRAVENOUS
  Filled 2017-02-07: qty 2

## 2017-02-07 MED ORDER — ACETAMINOPHEN 650 MG RE SUPP
650.0000 mg | Freq: Four times a day (QID) | RECTAL | Status: DC | PRN
Start: 2017-02-07 — End: 2017-02-07

## 2017-02-07 MED ORDER — FENTANYL CITRATE (PF) 100 MCG/2ML IJ SOLN
50.0000 ug | Freq: Once | INTRAMUSCULAR | Status: AC
  Administered 2017-02-07: 50 ug via INTRAVENOUS
  Filled 2017-02-07: qty 2

## 2017-02-07 MED ORDER — MORPHINE SULFATE (PF) 2 MG/ML IV SOLN: 2.0000 mg | INTRAVENOUS | Status: DC | PRN

## 2017-02-07 MED ORDER — DIPHENHYDRAMINE HCL 25 MG PO CAPS
25.0000 mg | ORAL_CAPSULE | Freq: Four times a day (QID) | ORAL | Status: DC | PRN
  Administered 2017-02-07 – 2017-02-08 (×2): 25 mg via ORAL
  Filled 2017-02-07 (×2): qty 1

## 2017-02-07 MED ORDER — ONDANSETRON HCL 4 MG PO TABS: 4.0000 mg | ORAL_TABLET | Freq: Four times a day (QID) | ORAL | Status: DC | PRN

## 2017-02-07 MED ORDER — MORPHINE SULFATE (PF) 4 MG/ML IV SOLN
2.0000 mg | INTRAVENOUS | Status: DC | PRN
  Administered 2017-02-07: 4 mg via INTRAVENOUS
  Filled 2017-02-07: qty 1

## 2017-02-07 MED ORDER — PIPERACILLIN-TAZOBACTAM 3.375 G IVPB: 3.3750 g | Freq: Three times a day (TID) | INTRAVENOUS | Status: DC

## 2017-02-07 MED ORDER — ACETAMINOPHEN 325 MG PO TABS: 650.0000 mg | ORAL_TABLET | Freq: Four times a day (QID) | ORAL | Status: DC | PRN

## 2017-02-07 MED ORDER — CLONIDINE HCL 0.1 MG PO TABS
0.1000 mg | ORAL_TABLET | Freq: Every day | ORAL | Status: DC
  Administered 2017-02-07 – 2017-02-08 (×2): 0.1 mg via ORAL
  Filled 2017-02-07 (×2): qty 1

## 2017-02-07 MED ORDER — DILTIAZEM HCL ER COATED BEADS 180 MG PO CP24
180.0000 mg | ORAL_CAPSULE | Freq: Every day | ORAL | Status: DC
  Administered 2017-02-07 – 2017-02-08 (×2): 180 mg via ORAL
  Filled 2017-02-07 (×2): qty 1

## 2017-02-07 MED ORDER — ONDANSETRON HCL 4 MG/2ML IJ SOLN
4.0000 mg | Freq: Four times a day (QID) | INTRAMUSCULAR | Status: DC | PRN
  Administered 2017-02-07 (×2): 4 mg via INTRAVENOUS
  Filled 2017-02-07 (×2): qty 2

## 2017-02-07 MED ORDER — TRAMADOL HCL 50 MG PO TABS
50.0000 mg | ORAL_TABLET | Freq: Four times a day (QID) | ORAL | Status: AC | PRN
  Administered 2017-02-07 (×2): 50 mg via ORAL
  Filled 2017-02-07 (×2): qty 1

## 2017-02-07 MED ORDER — SODIUM CHLORIDE 0.9 % IV SOLN
INTRAVENOUS | Status: DC
  Administered 2017-02-07 (×2): via INTRAVENOUS

## 2017-02-07 MED ORDER — VITAMIN D3 25 MCG (1000 UNIT) PO TABS
1000.0000 [IU] | ORAL_TABLET | Freq: Every day | ORAL | Status: DC
  Administered 2017-02-07 – 2017-02-08 (×2): 1000 [IU] via ORAL
  Filled 2017-02-07 (×2): qty 1

## 2017-02-07 MED ORDER — SODIUM CHLORIDE 0.9 % IV BOLUS (SEPSIS)
1000.0000 mL | Freq: Once | INTRAVENOUS | Status: AC
  Administered 2017-02-07: 1000 mL via INTRAVENOUS

## 2017-02-07 MED ORDER — CLONIDINE HCL 0.1 MG PO TABS
0.2000 mg | ORAL_TABLET | Freq: Every day | ORAL | Status: DC
  Administered 2017-02-07: 0.2 mg via ORAL
  Filled 2017-02-07: qty 2

## 2017-02-07 MED ORDER — CLONIDINE HCL 0.1 MG PO TABS
0.2000 mg | ORAL_TABLET | Freq: Once | ORAL | Status: AC
  Administered 2017-02-07: 0.2 mg via ORAL
  Filled 2017-02-07: qty 2

## 2017-02-07 MED ORDER — ASPIRIN 81 MG PO CHEW
81.0000 mg | CHEWABLE_TABLET | Freq: Every day | ORAL | Status: DC
  Administered 2017-02-07 – 2017-02-08 (×2): 81 mg via ORAL
  Filled 2017-02-07 (×2): qty 1

## 2017-02-07 MED ORDER — PIPERACILLIN-TAZOBACTAM 3.375 G IVPB 30 MIN
3.3750 g | Freq: Once | INTRAVENOUS | Status: AC
  Administered 2017-02-07: 3.375 g via INTRAVENOUS
  Filled 2017-02-07: qty 50

## 2017-02-07 MED ORDER — LABETALOL HCL 5 MG/ML IV SOLN
10.0000 mg | INTRAVENOUS | Status: DC | PRN
  Filled 2017-02-07: qty 4

## 2017-02-07 MED ORDER — INSULIN ASPART 100 UNIT/ML ~~LOC~~ SOLN
0.0000 [IU] | SUBCUTANEOUS | Status: DC
  Administered 2017-02-07 (×2): 5 [IU] via SUBCUTANEOUS

## 2017-02-07 MED ORDER — ROSUVASTATIN CALCIUM 10 MG PO TABS
20.0000 mg | ORAL_TABLET | Freq: Every day | ORAL | Status: DC
  Administered 2017-02-07 – 2017-02-08 (×2): 20 mg via ORAL
  Filled 2017-02-07 (×2): qty 2

## 2017-02-07 MED ORDER — GADOBENATE DIMEGLUMINE 529 MG/ML IV SOLN
10.0000 mL | Freq: Once | INTRAVENOUS | Status: AC | PRN
  Administered 2017-02-07: 9 mL via INTRAVENOUS

## 2017-02-07 NOTE — Progress Notes (Signed)
Patient seen and examined this morning, admitted overnight by Dr. Gwendolyn Lima, H&P reviewed and agree with the A/P  67 y.o. female with medical history significant of Kidney cancer, DM, HTN, cholecystectomy 8 years ago.  Patient presents to the ED with c/o R flank pain.  Radiates to lower abdomen. She was found to have elevated LFTs and concern for choledocholithiasis.    Transaminitis -concern for choledocholithiasis  -GI consulted, appreciate input -MRCP pending -LFTs improving today, patient is clinically improving   CKD 3 -baseline Cr. ~1.5 since 2013, currently at baseline  DM2 -Lantus / SSI, CBGs 119-134, continue current regimen   HTN -BP 155/75, continue current regimen with clonidine, cardizem,   HLD -continue Crestor    Kalista Laguardia M. Cruzita Lederer, MD [facility] [phone]  If 7PM-7AM, please contact night-coverage [email or web address] Password [email or web address]

## 2017-02-07 NOTE — Consult Note (Signed)
Referring Provider:  Dr. Maylon Peppers Primary Care Physician:  [name], MD Primary Gastroenterologist:  Althia Forts  Reason for Consultation:  Abdominal pain, abnormal LFTs, abnormal ultrasound  HPI: Briana Garrison is a 67 y.o. female past medical history of diabetes, hypertension, history of renal cancer presented to the hospital with abdominal pain. Upon initial evaluation she was found to have elevated LFTs with AST 721, ALT 872, , alkaline phosphatase 139, Normal total bilirubin. Normal CBC. Normal lipase. Ultrasound abdomen was ordered which showed prior cholecystectomy. Echogenic focus with shadowing at the porta hepatis and fatty liver. GI is consulted for further evaluation. Patient was also evaluated in the ED 2 days ago for abdominal pain. CT kidney stone protocol at that time showed no acute changes. It showed no abnormality in the hepatic biliary system as well as normal-appearing pancreas. Patient had normal LFTs 2 days ago.  Patient seen and examined at bedside. According to patient she started noticing right flank pain with radiation towards both right upper and lower quadrant around 2 days ago. Patient was seen in the ER on 02/05/2017. She had some improvement in symptoms but then she started having vomiting and decided to come to the hospital again. Patient denied any fever. Denied any diarrhea. Occasional constipation. Last bowel movement was yesterday. Denied any blood in the stool or blood in the vomiting. Denied any weight loss.  Colonoscopy around 2 years ago in [place]. Family history of colon cancer in sister in her 11s.    Past Medical History:  Diagnosis Date  . Cancer of kidney ([place]) 05/14/2009  . Diabetes mellitus   . Endometrial mass   . GERD (gastroesophageal reflux disease)   . Hypercholesterolemia   . Hypertension   . Internal hemorrhoids    S/P colonoscopy 06/10/00 by Dr. Lajoyce Corners  . Shingles     Past Surgical History:  Procedure Laterality Date  .  CHOLECYSTECTOMY  11/13/08  . CYSTOSCOPY KIDNEY W/ URETERAL GUIDE WIRE  05/14/09  . Diagnostic hysteroscopy, D&C, endometrial polypectomy  07/01/2005  . LIPECTOMY      Prior to Admission medications   Medication Sig Start Date End Date Taking? Authorizing Provider  aspirin 81 MG chewable tablet Chew 81 mg by mouth daily.   Yes [provider]  cholecalciferol (VITAMIN D) 1000 UNITS tablet Take 1,000 Units by mouth daily.    Yes [provider]  cloNIDine (CATAPRES) 0.1 MG tablet Take 0.1-0.2 mg by mouth 2 (two) times daily. 0.1 MG in AM and 0.2 in  PM   Yes [provider]  diltiazem (CARDIZEM CD) 180 MG 24 hr capsule Take 180 mg by mouth daily.   Yes [provider]  glipiZIDE (GLUCOTROL XL) 5 MG 24 hr tablet Take 5 mg by mouth daily with breakfast.   Yes [provider]  insulin glargine (LANTUS) 100 UNIT/ML injection Inject 40 Units into the skin at bedtime. Patient taking differently: Inject 64 Units into the skin at bedtime.  06/28/11  Yes Leana Gamer, MD  insulin lispro (HUMALOG) 100 UNIT/ML KiwkPen Inject 14-18 Units into the skin 3 (three) times daily. 14 units with breakfast and 18 units with supper   Yes [provider]  rosuvastatin (CRESTOR) 20 MG tablet Take 20 mg by mouth daily.   Yes [provider]  telmisartan-hydrochlorothiazide (MICARDIS HCT) 80-25 MG tablet Take 1 tablet by mouth daily.   Yes [provider]  traMADol (ULTRAM) 50 MG tablet Take 50 mg by mouth every 6 (six)  hours as needed for moderate pain or severe pain.   Yes [provider]  vitamin B-12 (CYANOCOBALAMIN) 500 MCG tablet Take 500 mcg by mouth daily.   Yes [provider]  amLODipine (NORVASC) 5 MG tablet Take 1 tablet (5 mg total) by mouth daily. Patient not taking: Reported on 02/05/2017 06/28/11 06/27/12  Leana Gamer, MD  insulin aspart (NOVOLOG) 100 UNIT/ML injection Inject 0-15 Units into the skin 3 (three)  times daily with meals. CBG 121 - 150: 2 units,151 - 200: 3 units, 201 - 250: 5 units, 251 - 300: 8 units, 301 - 350: 11 units, 351 - 400: 15 units Patient not taking: Reported on 02/05/2017 06/28/11 06/27/12  Leana Gamer, MD  insulin aspart (NOVOLOG) 100 UNIT/ML injection Inject 4 Units into the skin 3 (three) times daily with meals. Patient not taking: Reported on 02/05/2017 06/28/11 06/27/12  Leana Gamer, MD    Scheduled Meds: . aspirin  81 mg Oral Daily  . cholecalciferol  1,000 Units Oral Daily  . cloNIDine  0.1 mg Oral Daily  . cloNIDine  0.2 mg Oral QHS  . diltiazem  180 mg Oral Daily  . insulin aspart  0-15 Units Subcutaneous Q4H  . insulin glargine  30 Units Subcutaneous QHS  . rosuvastatin  20 mg Oral Daily   Continuous Infusions: . sodium chloride 100 mL/hr at 02/07/17 0231   PRN Meds:.labetalol, morphine injection, ondansetron **OR** ondansetron (ZOFRAN) IV, traMADol  Allergies as of 02/06/2017 - Review Complete 02/06/2017  Allergen Reaction Noted  . Amlodipine Swelling 02/05/2017    Family History  Problem Relation Age of Onset  . Ovarian cancer Mother   . Stroke Mother   . Stroke Father   . Multiple sclerosis Sister   . Stroke Brother     Social History   Socioeconomic History  . Marital status: Married    Spouse name: [name]  . Number of children: 2  . Years of education: 31  . Highest education level: Not on file  Social Needs  . Financial resource strain: Not on file  . Food insecurity - worry: Not on file  . Food insecurity - inability: Not on file  . Transportation needs - medical: Not on file  . Transportation needs - non-medical: Not on file  Occupational History  . 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 Smoking status: Never Smoker  Substance and Sexual Activity  . Alcohol use: No  . Drug use: No  . Sexual activity: Not on file  Other Topics Concern  . Not on file   Social History Narrative   Married.  Lives with husband.  Normally ambulates without assistance.    Review of Systems: Review of Systems  Constitutional: Negative for fever, malaise/fatigue and weight loss.  HENT: Negative for hearing loss and tinnitus.   Eyes: Negative for blurred vision and double vision.  Respiratory: Negative for cough and hemoptysis.   Cardiovascular: Negative for chest pain and palpitations.  Gastrointestinal: Positive for abdominal pain, constipation, heartburn, nausea and vomiting. Negative for blood in stool, diarrhea and melena.  Genitourinary: Negative for dysuria and urgency.  Musculoskeletal: Positive for back pain. Negative for myalgias and neck pain.  Skin: Negative for itching and rash.  Neurological: Negative for seizures, loss of consciousness and weakness.  Endo/Heme/Allergies: Does not bruise/bleed easily.  Psychiatric/Behavioral: Negative for hallucinations and suicidal ideas.    Physical Exam: Vital signs: Vitals:  02/07/17 0300 02/07/17 0421  BP: (!) 146/76 (!) 148/64  Pulse: 90 83  Resp:  15  Temp:  97.9 F (36.6 C)  SpO2: 95% 97%   Last BM Date: 02/06/17 Physical Exam  Constitutional: She is oriented to person, place, and time. She appears well-developed and well-nourished. No distress.  HENT:  Head: Normocephalic.  Nose: Nose normal.  Mouth/Throat: Oropharynx is clear and moist. No oropharyngeal exudate.  Eyes: EOM are normal. No scleral icterus.  Neck: Normal range of motion. Neck supple. No thyromegaly present.  Cardiovascular: Normal rate, regular rhythm and normal heart sounds.  Pulmonary/Chest: Effort normal and breath sounds normal. No respiratory distress.  Anterior exam only  Abdominal: Soft. Bowel sounds are normal. She exhibits no distension. There is no tenderness. There is no guarding.  Mild right upper quadrant discomfort without any significant tenderness. No peritoneal signs  Musculoskeletal: Normal range of  motion.  trace ankle edema bilaterally  Neurological: She is alert and oriented to person, place, and time.  Skin: Skin is warm. No erythema.  Psychiatric: She has a normal mood and affect. Judgment and thought content normal.  Nursing note and vitals reviewed.  GI:  Lab Results: Recent Labs    02/05/17 1408 02/06/17 1951 02/07/17 0618  WBC 7.5 7.8 7.4  HGB 11.6* 12.2 10.6*  HCT 34.8* 36.4 31.7*  PLT 281 305 246   BMET Recent Labs    02/05/17 1408 02/06/17 1951 02/07/17 0618  NA 133* 138 138  K 4.6 4.1 4.0  CL 102 104 107  CO2 25 26 24   GLUCOSE 239* 116* 127*  BUN 49* 37* 28*  CREATININE 2.07* 1.51* 1.41*  CALCIUM 9.8 10.3 9.2   LFT Recent Labs    02/07/17 0618  PROT 6.8  ALBUMIN 3.4*  AST 392*  ALT 619*  ALKPHOS 115  BILITOT 0.4   PT/INR No results for input(s): LABPROT, INR in the last 72 hours.   Studies/Results: US Abdomen Limited  Result Date: 02/07/2017 CLINICAL DATA:  Right upper quadrant abdominal pain EXAM: ULTRASOUND ABDOMEN LIMITED RIGHT UPPER QUADRANT COMPARISON:  02/05/2017 CT FINDINGS: Gallbladder: Surgically absent Common bile duct: Diameter: Measures up to 10.8 mm. Possible echogenic focus with shadowing at the porta hepatis. Liver: Increased echogenicity with limited evaluation of parenchyma. Portal vein is patent on color Doppler imaging with normal direction of blood flow towards the liver. IMPRESSION: 1. Surgical absence of the gallbladder. Echogenic focus with shadowing at the porta hepatis. Uncertain if this represents surgical clips with artifact from prior cholecystectomy versus a common duct stone. Suggest correlation with LFTs, MRCP could be obtained if clinical suspicion for duct stone 2. Fatty infiltration of the liver Electronically Signed   By: Donavan Foil M.D.   On: 02/07/2017 01:14   Ct Renal Stone Study  Result Date: 02/05/2017 CLINICAL DATA:  67 year old female with right flank pain concern for kidney stone. History of left  lower pole renal cell carcinoma treated with wedge resection in April 2011. EXAM: CT ABDOMEN AND PELVIS WITHOUT CONTRAST TECHNIQUE: Multidetector CT imaging of the abdomen and pelvis was performed following the standard protocol without IV contrast. COMPARISON:  Abdominal CT dated 11/08/2015 FINDINGS: Evaluation of this exam is limited in the absence of intravenous contrast. Lower chest: There are bibasilar linear atelectasis/scarring. There is a 6 mm right upper lobe subpleural nodule (series 6, image 6) similar to CT study of 06/18/2012. No intra-abdominal free air or free fluid. Hepatobiliary: The liver is unremarkable.  Cholecystectomy. Pancreas: Unremarkable. No  pancreatic ductal dilatation or surrounding inflammatory changes. Spleen: Normal in size without focal abnormality. Adrenals/Urinary Tract: There is a 14 mm right adrenal adenoma. The left adrenal gland is unremarkable. Scattered punctate calcific densities in the kidneys bilaterally may represent small nonobstructing stones versus vascular calcification. Postsurgical changes of the inferior pole of the left kidney with small linear probable dystrophic calcification. There is no hydronephrosis on either side. Faint small bilateral renal hypodense lesions are not characterized on this noncontrast CT but appears similar to prior CT. The visualized ureters and urinary bladder appear unremarkable. Stomach/Bowel: There is no bowel obstruction or active inflammation. There is scattered colonic diverticula without active inflammatory changes the appendix is normal. Vascular/Lymphatic: Mild aortoiliac atherosclerotic disease. No portal venous gas. There is no adenopathy. Reproductive: The uterus is anteverted. There is probable small uterine fibroid from the anterior body with indentation of the posterior bladder wall. Bilateral tubal ligation clips noted. Other: None Musculoskeletal: No acute osseous pathology. IMPRESSION: 1. No hydronephrosis or obstructing  stone probable punctate nonobstructing bilateral renal calculi. 2. Postsurgical changes of wedge resection of the inferior pole of the left kidney similar to prior CT 3. Multiple small scattered colonic diverticula without active inflammatory changes. No bowel obstruction. Normal appendix. 4. Mild Aortic Atherosclerosis (ICD10-I70.0). Electronically Signed   By: Anner Crete M.D.   On: 02/05/2017 22:55    Impression/Plan: - Abnormal LFTs.AST 721, ALT 872, , alkaline phosphatase 139, Normal total bilirubin. Normal LFts two days ago.Ultrasound abdomen  showed prior cholecystectomy. Echogenic focus with shadowing at the porta hepatis and fatty liver. ?? Passed stone.  - Abdominal pain. Right flank pain with radiation towards right side. CT scan 02/05/2017 showed no acute changes. - Family history of colon cancer in sister. Last colonoscopy 2 years ago at [facility].  Recommendations ------------------------- - Patient with normal white count. Normal lipase. Afebrile. Case discussed with referring physician. - MRI-MRCP for further evaluation. - Follow hepatitis panel - Monitor LFTs. - ok  to have a liquid diet after MRI. - GI will follow.    LOS: 0 days   Otis Brace  MD, FACP 02/07/2017, 8:11 AM  Contact #  [phone]

## 2017-02-07 NOTE — ED Notes (Signed)
ED TO INPATIENT HANDOFF REPORT  Name/Age/Gender Briana Garrison 67 y.o. female  Code Status    Code Status Orders  (From admission, onward)        Start     Ordered   02/07/17 0256  Full code  Continuous     02/07/17 0256    Code Status History    Date Active Date Inactive Code Status Order ID Comments User Context   This patient has a current code status but no historical code status.      Home/SNF/Other   Chief Complaint Emesis, right side hip pain  Level of Care/Admitting Diagnosis ED Disposition    ED Disposition Condition [facility]: [facility] [[id number]]  Level of Care: Med-Surg [16]  Diagnosis: Choledocholithiasis with obstruction [[id number]]  Admitting Physician: Etta Quill [[id number]]  Attending Physician: Etta Quill [[id number]]  Estimated length of stay: past midnight tomorrow  Certification:: I certify this patient will need inpatient services for at least 2 midnights  PT Class (Do Not Modify): Inpatient [101]  PT Acc Code (Do Not Modify): Private [1]       Medical History Past Medical History:  Diagnosis Date  . Cancer of kidney ([place]) 05/14/2009  . Diabetes mellitus   . Endometrial mass   . GERD (gastroesophageal reflux disease)   . Hypercholesterolemia   . Hypertension   . Internal hemorrhoids    S/P colonoscopy 06/10/00 by Dr. Lajoyce Corners  . Shingles     Allergies Allergies  Allergen Reactions  . Amlodipine Swelling    IV Location/Drains/Wounds Patient Lines/Drains/Airways Status   Active Line/Drains/Airways    Name:   Placement date:   Placement time:   Site:   Days:   Peripheral IV 02/07/17 Left Forearm   02/07/17    0023    Forearm   less than 1          Labs/Imaging Results for orders placed or performed during the hospital encounter of 02/06/17 (from the past 48 hour(s))  Lipase, blood     Status: None   Collection Time: 02/06/17  7:51 PM  Result Value Ref Range   Lipase 20 11 - 51 U/L   Comprehensive metabolic panel     Status: Abnormal   Collection Time: 02/06/17  7:51 PM  Result Value Ref Range   Sodium 138 135 - 145 mmol/L   Potassium 4.1 3.5 - 5.1 mmol/L   Chloride 104 101 - 111 mmol/L   CO2 26 22 - 32 mmol/L   Glucose, Bld 116 (H) 65 - 99 mg/dL   BUN 37 (H) 6 - 20 mg/dL   Creatinine, Ser 1.51 (H) 0.44 - 1.00 mg/dL   Calcium 10.3 8.9 - 10.3 mg/dL   Total Protein 8.5 (H) 6.5 - 8.1 g/dL   Albumin 4.1 3.5 - 5.0 g/dL   AST 721 (H) 15 - 41 U/L   ALT 872 (H) 14 - 54 U/L   Alkaline Phosphatase 139 (H) 38 - 126 U/L   Total Bilirubin 0.7 0.3 - 1.2 mg/dL   GFR calc non Af Amer 35 (L) >60 mL/min   GFR calc Af Amer 40 (L) >60 mL/min    Comment: (NOTE) The eGFR has been calculated using the CKD EPI equation. This calculation has not been validated in all clinical situations. eGFR's persistently <60 mL/min signify possible Chronic Kidney Disease.    Anion gap 8 5 - 15  CBC     Status: None  Collection Time: 02/06/17  7:51 PM  Result Value Ref Range   WBC 7.8 4.0 - 10.5 K/uL   RBC 4.23 3.87 - 5.11 MIL/uL   Hemoglobin 12.2 12.0 - 15.0 g/dL   HCT 36.4 36.0 - 46.0 %   MCV 86.1 78.0 - 100.0 fL   MCH 28.8 26.0 - 34.0 pg   MCHC 33.5 30.0 - 36.0 g/dL   RDW 13.5 11.5 - 15.5 %   Platelets 305 150 - 400 K/uL  Urinalysis, Routine w reflex microscopic     Status: Abnormal   Collection Time: 02/06/17  7:51 PM  Result Value Ref Range   Color, Urine YELLOW YELLOW   APPearance CLEAR CLEAR   Specific Gravity, Urine 1.016 1.005 - 1.030   pH 5.0 5.0 - 8.0   Glucose, UA 50 (A) NEGATIVE mg/dL   Hgb urine dipstick NEGATIVE NEGATIVE   Bilirubin Urine NEGATIVE NEGATIVE   Ketones, ur NEGATIVE NEGATIVE mg/dL   Protein, ur 100 (A) NEGATIVE mg/dL   Nitrite NEGATIVE NEGATIVE   Leukocytes, UA SMALL (A) NEGATIVE   RBC / HPF 0-5 0 - 5 RBC/hpf   WBC, UA 6-30 0 - 5 WBC/hpf   Bacteria, UA RARE (A) NONE SEEN   Squamous Epithelial / LPF 0-5 (A) NONE SEEN   Mucus PRESENT   Acetaminophen  level     Status: Abnormal   Collection Time: 02/07/17 12:25 AM  Result Value Ref Range   Acetaminophen (Tylenol), Serum <10 (L) 10 - 30 ug/mL    Comment:        THERAPEUTIC CONCENTRATIONS VARY SIGNIFICANTLY. A RANGE OF 10-30 ug/mL MAY BE AN EFFECTIVE CONCENTRATION FOR MANY PATIENTS. HOWEVER, SOME ARE BEST TREATED AT CONCENTRATIONS OUTSIDE THIS RANGE. ACETAMINOPHEN CONCENTRATIONS >150 ug/mL AT 4 HOURS AFTER INGESTION AND >50 ug/mL AT 12 HOURS AFTER INGESTION ARE OFTEN ASSOCIATED WITH TOXIC REACTIONS.    US Abdomen Limited  Result Date: 02/07/2017 CLINICAL DATA:  Right upper quadrant abdominal pain EXAM: ULTRASOUND ABDOMEN LIMITED RIGHT UPPER QUADRANT COMPARISON:  02/05/2017 CT FINDINGS: Gallbladder: Surgically absent Common bile duct: Diameter: Measures up to 10.8 mm. Possible echogenic focus with shadowing at the porta hepatis. Liver: Increased echogenicity with limited evaluation of parenchyma. Portal vein is patent on color Doppler imaging with normal direction of blood flow towards the liver. IMPRESSION: 1. Surgical absence of the gallbladder. Echogenic focus with shadowing at the porta hepatis. Uncertain if this represents surgical clips with artifact from prior cholecystectomy versus a common duct stone. Suggest correlation with LFTs, MRCP could be obtained if clinical suspicion for duct stone 2. Fatty infiltration of the liver Electronically Signed   By: Donavan Foil M.D.   On: 02/07/2017 01:14   Ct Renal Stone Study  Result Date: 02/05/2017 CLINICAL DATA:  67 year old female with right flank pain concern for kidney stone. History of left lower pole renal cell carcinoma treated with wedge resection in April 2011. EXAM: CT ABDOMEN AND PELVIS WITHOUT CONTRAST TECHNIQUE: Multidetector CT imaging of the abdomen and pelvis was performed following the standard protocol without IV contrast. COMPARISON:  Abdominal CT dated 11/08/2015 FINDINGS: Evaluation of this exam is limited in the  absence of intravenous contrast. Lower chest: There are bibasilar linear atelectasis/scarring. There is a 6 mm right upper lobe subpleural nodule (series 6, image 6) similar to CT study of 06/18/2012. No intra-abdominal free air or free fluid. Hepatobiliary: The liver is unremarkable.  Cholecystectomy. Pancreas: Unremarkable. No pancreatic ductal dilatation or surrounding inflammatory changes. Spleen: Normal in size without focal  abnormality. Adrenals/Urinary Tract: There is a 14 mm right adrenal adenoma. The left adrenal gland is unremarkable. Scattered punctate calcific densities in the kidneys bilaterally may represent small nonobstructing stones versus vascular calcification. Postsurgical changes of the inferior pole of the left kidney with small linear probable dystrophic calcification. There is no hydronephrosis on either side. Faint small bilateral renal hypodense lesions are not characterized on this noncontrast CT but appears similar to prior CT. The visualized ureters and urinary bladder appear unremarkable. Stomach/Bowel: There is no bowel obstruction or active inflammation. There is scattered colonic diverticula without active inflammatory changes the appendix is normal. Vascular/Lymphatic: Mild aortoiliac atherosclerotic disease. No portal venous gas. There is no adenopathy. Reproductive: The uterus is anteverted. There is probable small uterine fibroid from the anterior body with indentation of the posterior bladder wall. Bilateral tubal ligation clips noted. Other: None Musculoskeletal: No acute osseous pathology. IMPRESSION: 1. No hydronephrosis or obstructing stone probable punctate nonobstructing bilateral renal calculi. 2. Postsurgical changes of wedge resection of the inferior pole of the left kidney similar to prior CT 3. Multiple small scattered colonic diverticula without active inflammatory changes. No bowel obstruction. Normal appendix. 4. Mild Aortic Atherosclerosis (ICD10-I70.0).  Electronically Signed   By: Anner Crete M.D.   On: 02/05/2017 22:55    Pending Labs Unresulted Labs (From admission, onward)   Start     Ordered   02/07/17 0500  CBC  Tomorrow morning,   R     02/07/17 0256   02/07/17 0500  Comprehensive metabolic panel  Tomorrow morning,   R     02/07/17 0256   02/07/17 0016  Hepatitis panel, acute  STAT,   STAT     02/07/17 0015   02/07/17 0006  Urine culture  STAT,   STAT     02/07/17 0005      Vitals/Pain Today's Vitals   02/07/17 0100 02/07/17 0130 02/07/17 0200 02/07/17 0300  BP: (!) 153/82 (!) 175/83 (!) 188/87 (!) 146/76  Pulse: 84 84 89 90  Resp:   18   Temp:      TempSrc:      SpO2: 96% 100% 99% 95%  PainSc:        Isolation Precautions No active isolations  Medications Medications  0.9 %  sodium chloride infusion ( Intravenous Rate/Dose Change 02/07/17 0231)  aspirin chewable tablet 81 mg (not administered)  cholecalciferol (VITAMIN D) tablet 1,000 Units (not administered)  cloNIDine (CATAPRES) tablet 0.1 mg (not administered)  diltiazem (CARDIZEM CD) 24 hr capsule 180 mg (not administered)  rosuvastatin (CRESTOR) tablet 20 mg (not administered)  insulin aspart (novoLOG) injection 0-15 Units (not administered)  insulin glargine (LANTUS) injection 30 Units (30 Units Subcutaneous Given 02/07/17 0334)  labetalol (NORMODYNE,TRANDATE) injection 10 mg (not administered)  cloNIDine (CATAPRES) tablet 0.2 mg (not administered)  acetaminophen (TYLENOL) tablet 650 mg (not administered)    Or  acetaminophen (TYLENOL) suppository 650 mg (not administered)  ondansetron (ZOFRAN) tablet 4 mg (not administered)    Or  ondansetron (ZOFRAN) injection 4 mg (not administered)  morphine 2 MG/ML injection 2-4 mg (not administered)  sodium chloride 0.9 % bolus 1,000 mL (0 mLs Intravenous Stopped 02/07/17 0141)  ondansetron (ZOFRAN) injection 4 mg (4 mg Intravenous Given 02/07/17 0024)  fentaNYL (SUBLIMAZE) injection 50 mcg (50 mcg Intravenous  Given 02/07/17 0024)  cloNIDine (CATAPRES) tablet 0.2 mg (0.2 mg Oral Given 02/07/17 0032)  piperacillin-tazobactam (ZOSYN) IVPB 3.375 g (0 g Intravenous Stopped 02/07/17 0310)    Mobility walks

## 2017-02-07 NOTE — ED Provider Notes (Signed)
[facility] Provider Note   CSN: [id number] Arrival date & time: 02/06/17  4696     History   Chief Complaint Chief Complaint  Patient presents with  . Emesis  . Flank Pain    HPI Briana Garrison is a 67 y.o. female with a history of kidney cancer status post resection, hypertension, who presents today for evaluation of right-sided abdominal/flank pain.  She reports that her pain began yesterday and she was seen and evaluated for this at [facility].  She reports that she felt better while she was here, however is worse.  She reports that she is still unable to keep down p.o. intake.  She reports that she has been unable to take her blood pressure medication secondary to nausea and vomiting.  She did not take him medicines this morning, and has not taken her nighttime clonidine.  She had a cholecystectomy in 2010.  She does endorse occasional diarrhea, no constipation.  She is vomiting up "what ever I have in my stomach."  She denies any bloody emesis.  HPI  Past Medical History:  Diagnosis Date  . Cancer of kidney ([place]) 05/14/2009  . Diabetes mellitus   . Endometrial mass   . GERD (gastroesophageal reflux disease)   . Hypercholesterolemia   . Hypertension   . Internal hemorrhoids    S/P colonoscopy 06/10/00 by Dr. Lajoyce Corners  . Shingles     Patient Active Problem List   Diagnosis Date Noted  . Choledocholithiasis with obstruction 02/07/2017  . AKI (acute kidney injury) ([place]) 02/07/2017  . ARF (acute renal failure) ([place]) 06/23/2011  . Hypoglycemia associated with diabetes ([place]) 06/23/2011  . DM (diabetes mellitus) ([place]) 06/23/2011  . HTN (hypertension) 06/23/2011  . Hyperlipidemia 06/23/2011  . Normocytic anemia 06/23/2011  . UTI (lower urinary tract infection) 06/23/2011    Past Surgical History:  Procedure Laterality Date  . CHOLECYSTECTOMY  11/13/08  . CYSTOSCOPY KIDNEY W/ URETERAL GUIDE WIRE  05/14/09  . Diagnostic hysteroscopy,  D&C, endometrial polypectomy  07/01/2005  . LIPECTOMY      OB History    No data available       Home Medications    Prior to Admission medications   Medication Sig Start Date End Date Taking? Authorizing Provider  aspirin 81 MG chewable tablet Chew 81 mg by mouth daily.   Yes [provider]  cholecalciferol (VITAMIN D) 1000 UNITS tablet Take 1,000 Units by mouth daily.    Yes [provider]  cloNIDine (CATAPRES) 0.1 MG tablet Take 0.1-0.2 mg by mouth 2 (two) times daily. 0.1 MG in AM and 0.2 in  PM   Yes [provider]  diltiazem (CARDIZEM CD) 180 MG 24 hr capsule Take 180 mg by mouth daily.   Yes [provider]  glipiZIDE (GLUCOTROL XL) 5 MG 24 hr tablet Take 5 mg by mouth daily with breakfast.   Yes [provider]  insulin glargine (LANTUS) 100 UNIT/ML injection Inject 40 Units into the skin at bedtime. Patient taking differently: Inject 64 Units into the skin at bedtime.  06/28/11  Yes Leana Gamer, MD  insulin lispro (HUMALOG) 100 UNIT/ML KiwkPen Inject 14-18 Units into the skin 3 (three) times daily. 14 units with breakfast and 18 units with supper   Yes [provider]  rosuvastatin (CRESTOR) 20 MG tablet Take 20 mg by mouth daily.   Yes [provider]  telmisartan-hydrochlorothiazide (MICARDIS HCT) 80-25 MG tablet Take 1 tablet  by mouth daily.   Yes [provider]  traMADol (ULTRAM) 50 MG tablet Take 50 mg by mouth every 6 (six) hours as needed for moderate pain or severe pain.   Yes [provider]  vitamin B-12 (CYANOCOBALAMIN) 500 MCG tablet Take 500 mcg by mouth daily.   Yes [provider]  amLODipine (NORVASC) 5 MG tablet Take 1 tablet (5 mg total) by mouth daily. Patient not taking: Reported on 02/05/2017 06/28/11 06/27/12  Matthews, Michelle A, MD  insulin aspart (NOVOLOG) 100 UNIT/ML injection Inject 0-15 Units into the skin 3 (three) times daily with meals. CBG 121 - 150: 2  units,151 - 200: 3 units, 201 - 250: 5 units, 251 - 300: 8 units, 301 - 350: 11 units, 351 - 400: 15 units Patient not taking: Reported on 02/05/2017 06/28/11 06/27/12  Matthews, Michelle A, MD  insulin aspart (NOVOLOG) 100 UNIT/ML injection Inject 4 Units into the skin 3 (three) times daily with meals. Patient not taking: Reported on 02/05/2017 06/28/11 06/27/12  Matthews, Michelle A, MD    Family History Family History  Problem Relation Age of Onset  . Ovarian cancer Mother   . Stroke Mother   . Stroke Father   . Multiple sclerosis Sister   . Stroke Brother     Social History Social History   Tobacco Use  . Smoking status: Never Smoker  Substance Use Topics  . Alcohol use: No  . Drug use: No     Allergies   Amlodipine   Review of Systems Review of Systems  Constitutional: Negative for chills and fever.  HENT: Negative for ear pain and sore throat.   Eyes: Negative for pain and visual disturbance.  Respiratory: Negative for cough and shortness of breath.   Cardiovascular: Negative for chest pain and palpitations.  Gastrointestinal: Positive for abdominal distention, abdominal pain, diarrhea, nausea and vomiting. Negative for blood in stool and constipation.  Genitourinary: Positive for flank pain. Negative for dysuria and hematuria.  Musculoskeletal: Negative for arthralgias and back pain.  Skin: Negative for color change and rash.  Neurological: Negative for seizures and syncope.  All other systems reviewed and are negative.    Physical Exam Updated Vital Signs BP (!) 155/75 (BP Location: Right Arm)   Pulse 74   Temp 98.1 F (36.7 C) (Oral)   Resp 18   SpO2 95%   Physical Exam  Constitutional: She appears well-developed and well-nourished. No distress.  HENT:  Head: Normocephalic and atraumatic.  Eyes: Conjunctivae are normal.  Neck: Neck supple.  Cardiovascular: Normal rate, regular rhythm and intact distal pulses.  No murmur heard. Pulmonary/Chest: Effort  normal and breath sounds normal. No respiratory distress.  Abdominal: Soft. Bowel sounds are normal. She exhibits no mass. There is tenderness in the right upper quadrant. There is no rigidity, no guarding and no CVA tenderness.  Musculoskeletal: She exhibits no edema.  Neurological: She is alert.  Skin: Skin is warm and dry.  Psychiatric: She has a normal mood and affect.  Nursing note and vitals reviewed.    ED Treatments / Results  Labs (all labs ordered are listed, but only abnormal results are displayed) Labs Reviewed  COMPREHENSIVE METABOLIC PANEL - Abnormal; Notable for the following components:      Result Value   Glucose, Bld 116 (*)    BUN 37 (*)    Creatinine, Ser 1.51 (*)    Total Protein 8.5 (*)    AST 721 (*)    ALT 872 (*)      Alkaline Phosphatase 139 (*)    GFR calc non Af Amer 35 (*)    GFR calc Af Amer 40 (*)    All other components within normal limits  URINALYSIS, ROUTINE W REFLEX MICROSCOPIC - Abnormal; Notable for the following components:   Glucose, UA 50 (*)    Protein, ur 100 (*)    Leukocytes, UA SMALL (*)    Bacteria, UA RARE (*)    Squamous Epithelial / LPF 0-5 (*)    All other components within normal limits  ACETAMINOPHEN LEVEL - Abnormal; Notable for the following components:   Acetaminophen (Tylenol), Serum <10 (*)    All other components within normal limits  CBC - Abnormal; Notable for the following components:   RBC 3.63 (*)    Hemoglobin 10.6 (*)    HCT 31.7 (*)    All other components within normal limits  COMPREHENSIVE METABOLIC PANEL - Abnormal; Notable for the following components:   Glucose, Bld 127 (*)    BUN 28 (*)    Creatinine, Ser 1.41 (*)    Albumin 3.4 (*)    AST 392 (*)    ALT 619 (*)    GFR calc non Af Amer 38 (*)    GFR calc Af Amer 44 (*)    All other components within normal limits  GLUCOSE, CAPILLARY - Abnormal; Notable for the following components:   Glucose-Capillary 119 (*)    All other components within  normal limits  GLUCOSE, CAPILLARY - Abnormal; Notable for the following components:   Glucose-Capillary 134 (*)    All other components within normal limits  URINE CULTURE  LIPASE, BLOOD  CBC  HEPATITIS PANEL, ACUTE    EKG  EKG Interpretation None       Radiology- Renal stone study obtained yesterday, not repeated today.  Us Abdomen Limited  Result Date: 02/07/2017 CLINICAL DATA:  Right upper quadrant abdominal pain EXAM: ULTRASOUND ABDOMEN LIMITED RIGHT UPPER QUADRANT COMPARISON:  02/05/2017 CT FINDINGS: Gallbladder: Surgically absent Common bile duct: Diameter: Measures up to 10.8 mm. Possible echogenic focus with shadowing at the porta hepatis. Liver: Increased echogenicity with limited evaluation of parenchyma. Portal vein is patent on color Doppler imaging with normal direction of blood flow towards the liver. IMPRESSION: 1. Surgical absence of the gallbladder. Echogenic focus with shadowing at the porta hepatis. Uncertain if this represents surgical clips with artifact from prior cholecystectomy versus a common duct stone. Suggest correlation with LFTs, MRCP could be obtained if clinical suspicion for duct stone 2. Fatty infiltration of the liver Electronically Signed   By: Kim  Fujinaga M.D.   On: 02/07/2017 01:14   Ct Renal Stone Study  Result Date: 02/05/2017 CLINICAL DATA:  66-year-old female with right flank pain concern for kidney stone. History of left lower pole renal cell carcinoma treated with wedge resection in April 2011. EXAM: CT ABDOMEN AND PELVIS WITHOUT CONTRAST TECHNIQUE: Multidetector CT imaging of the abdomen and pelvis was performed following the standard protocol without IV contrast. COMPARISON:  Abdominal CT dated 11/08/2015 FINDINGS: Evaluation of this exam is limited in the absence of intravenous contrast. Lower chest: There are bibasilar linear atelectasis/scarring. There is a 6 mm right upper lobe subpleural nodule (series 6, image 6) similar to CT study of  06/18/2012. No intra-abdominal free air or free fluid. Hepatobiliary: The liver is unremarkable.  Cholecystectomy. Pancreas: Unremarkable. No pancreatic ductal dilatation or surrounding inflammatory changes. Spleen: Normal in size without focal abnormality. Adrenals/Urinary Tract: There is a 14 mm right adrenal   adenoma. The left adrenal gland is unremarkable. Scattered punctate calcific densities in the kidneys bilaterally may represent small nonobstructing stones versus vascular calcification. Postsurgical changes of the inferior pole of the left kidney with small linear probable dystrophic calcification. There is no hydronephrosis on either side. Faint small bilateral renal hypodense lesions are not characterized on this noncontrast CT but appears similar to prior CT. The visualized ureters and urinary bladder appear unremarkable. Stomach/Bowel: There is no bowel obstruction or active inflammation. There is scattered colonic diverticula without active inflammatory changes the appendix is normal. Vascular/Lymphatic: Mild aortoiliac atherosclerotic disease. No portal venous gas. There is no adenopathy. Reproductive: The uterus is anteverted. There is probable small uterine fibroid from the anterior body with indentation of the posterior bladder wall. Bilateral tubal ligation clips noted. Other: None Musculoskeletal: No acute osseous pathology. IMPRESSION: 1. No hydronephrosis or obstructing stone probable punctate nonobstructing bilateral renal calculi. 2. Postsurgical changes of wedge resection of the inferior pole of the left kidney similar to prior CT 3. Multiple small scattered colonic diverticula without active inflammatory changes. No bowel obstruction. Normal appendix. 4. Mild Aortic Atherosclerosis (ICD10-I70.0). Electronically Signed   By: Anner Crete M.D.   On: 02/05/2017 22:55    Procedures Procedures (including critical care time)  Medications Ordered in ED Medications  0.9 %  sodium  chloride infusion ( Intravenous Rate/Dose Change 02/07/17 0231)  aspirin chewable tablet 81 mg (not administered)  cholecalciferol (VITAMIN D) tablet 1,000 Units (not administered)  cloNIDine (CATAPRES) tablet 0.1 mg (not administered)  diltiazem (CARDIZEM CD) 24 hr capsule 180 mg (not administered)  rosuvastatin (CRESTOR) tablet 20 mg (not administered)  insulin aspart (novoLOG) injection 0-15 Units (0 Units Subcutaneous Not Given 02/07/17 0849)  insulin glargine (LANTUS) injection 30 Units (30 Units Subcutaneous Not Given 02/07/17 0423)  labetalol (NORMODYNE,TRANDATE) injection 10 mg (not administered)  cloNIDine (CATAPRES) tablet 0.2 mg (not administered)  ondansetron (ZOFRAN) tablet 4 mg ( Oral See Alternative 02/07/17 0433)    Or  ondansetron (ZOFRAN) injection 4 mg (4 mg Intravenous Given 02/07/17 0433)  traMADol (ULTRAM) tablet 50 mg (50 mg Oral Given 02/07/17 0433)  morphine 4 MG/ML injection 2-4 mg (4 mg Intravenous Given 02/07/17 0433)  sodium chloride 0.9 % bolus 1,000 mL (0 mLs Intravenous Stopped 02/07/17 0141)  ondansetron (ZOFRAN) injection 4 mg (4 mg Intravenous Given 02/07/17 0024)  fentaNYL (SUBLIMAZE) injection 50 mcg (50 mcg Intravenous Given 02/07/17 0024)  cloNIDine (CATAPRES) tablet 0.2 mg (0.2 mg Oral Given 02/07/17 0032)  piperacillin-tazobactam (ZOSYN) IVPB 3.375 g (0 g Intravenous Stopped 02/07/17 0310)     Initial Impression / Assessment and Plan / ED Course  I have reviewed the triage vital signs and the nursing notes.  Pertinent labs & imaging results that were available during my care of the patient were reviewed by me and considered in my medical decision making (see chart for details).    Briana Garrison presents today for evaluation of continued right upper quadrant pain/flank pain.  Labs were obtained, and when compared with yesterday's labs showed that her ALT/AST, along with alk phos were markedly elevated when they were normal yesterday.  Patient is status post  cholecystectomy.  CT scan was obtained yesterday.  Given unremarkable CT scan, right upper quadrant ultrasound was obtained, concerning for choledocholithiasis.  Medicine was consulted for admission.  Patient's pain and other symptoms were treated while she was in the emergency room.  Patient does not meet Sirs or sepsis criteria.  Patient was started on Zosyn.  This patient was seen as a shared visit with Dr. Rancour who evaluated the patient and agreed with my plan.   The patient appears reasonably stabilized for admission considering the current resources, flow, and capabilities available in the ED at this time, and I doubt any other EMC requiring further screening and/or treatment in the ED prior to admission.   Final Clinical Impressions(s) / ED Diagnoses   Final diagnoses:  Choledocholithiasis    ED Discharge Orders    None       ,  W, PA-C 02/07/17 0942    Rancour, Stephen, MD 02/07/17 2236  

## 2017-02-07 NOTE — H&P (Signed)
History and Physical    Briana Garrison KMQ:[id number] DOB: 1950/02/22 DOA: 02/06/2017  PCP: [name], MD  Patient coming from: Home  I have personally briefly reviewed patient's old medical records in [place]  Chief Complaint: Abd pain  HPI: Briana Garrison is a 67 y.o. female with medical history significant of Kidney cancer, DM, HTN, cholecystectomy 8 years ago.  Patient presents to the ED with c/o R flank pain.  Radiates to lower abdomen.  Onset 9 am yesterday.  Patient came in to the ED and had CT scan which didn't reveal obvious cause.  Had creat of 2.07 and was given IVF.  Today she was still unable to tolerate PO intake.  Having N/V with this so she returned to the ED.   ED Course: Lab work today shows improvement in her creat to 1.5; however, her LFTs have markedly increased with ALT/AST at 721/872.  Alk phos 139.  Tylenol level neg, US abdomen demonstrates what appears to be a CBD stone vs surgical clip.  CBD is 10.46m.   Review of Systems: As per HPI otherwise 10 point review of systems negative.   Past Medical History:  Diagnosis Date  . Cancer of kidney ([place] 05/14/2009  . Diabetes mellitus   . Endometrial mass   . GERD (gastroesophageal reflux disease)   . Hypercholesterolemia   . Hypertension   . Internal hemorrhoids    S/P colonoscopy 06/10/00 by Dr. OLajoyce Corners . Shingles     Past Surgical History:  Procedure Laterality Date  . CHOLECYSTECTOMY  11/13/08  . CYSTOSCOPY KIDNEY W/ URETERAL GUIDE WIRE  05/14/09  . Diagnostic hysteroscopy, D&C, endometrial polypectomy  07/01/2005  . LIPECTOMY       reports that  has never smoked. She does not have any smokeless tobacco history on file. She reports that she does not drink alcohol or use drugs.  Allergies  Allergen Reactions  . Amlodipine Swelling    Family History  Problem Relation Age of Onset  . Ovarian cancer Mother   . Stroke Mother   . Stroke Father   . Multiple sclerosis Sister   . Stroke  Brother      Prior to Admission medications   Medication Sig Start Date End Date Taking? Authorizing Provider  aspirin 81 MG chewable tablet Chew 81 mg by mouth daily.   Yes [provider]  cholecalciferol (VITAMIN D) 1000 UNITS tablet Take 1,000 Units by mouth daily.    Yes [provider]  cloNIDine (CATAPRES) 0.1 MG tablet Take 0.1-0.2 mg by mouth 2 (two) times daily. 0.1 MG in AM and 0.2 in  PM   Yes [provider]  diltiazem (CARDIZEM CD) 180 MG 24 hr capsule Take 180 mg by mouth daily.   Yes [provider]  glipiZIDE (GLUCOTROL XL) 5 MG 24 hr tablet Take 5 mg by mouth daily with breakfast.   Yes [provider]  insulin glargine (LANTUS) 100 UNIT/ML injection Inject 40 Units into the skin at bedtime. Patient taking differently: Inject 64 Units into the skin at bedtime.  06/28/11  Yes MLeana Gamer MD  insulin lispro (HUMALOG) 100 UNIT/ML KiwkPen Inject 14-18 Units into the skin 3 (three) times daily. 14 units with breakfast and 18 units with supper   Yes [provider]  rosuvastatin (CRESTOR) 20 MG tablet Take 20 mg by mouth daily.   Yes [provider]  telmisartan-hydrochlorothiazide (MICARDIS HCT) 80-25 MG tablet Take 1 tablet by mouth daily.  Yes [provider]  traMADol (ULTRAM) 50 MG tablet Take 50 mg by mouth every 6 (six) hours as needed for moderate pain or severe pain.   Yes [provider]  vitamin B-12 (CYANOCOBALAMIN) 500 MCG tablet Take 500 mcg by mouth daily.   Yes [provider]  amLODipine (NORVASC) 5 MG tablet Take 1 tablet (5 mg total) by mouth daily. Patient not taking: Reported on 02/05/2017 06/28/11 06/27/12  Leana Gamer, MD  insulin aspart (NOVOLOG) 100 UNIT/ML injection Inject 0-15 Units into the skin 3 (three) times daily with meals. CBG 121 - 150: 2 units,151 - 200: 3 units, 201 - 250: 5 units, 251 - 300: 8 units, 301 - 350: 11 units, 351 - 400: 15  units Patient not taking: Reported on 02/05/2017 06/28/11 06/27/12  Leana Gamer, MD  insulin aspart (NOVOLOG) 100 UNIT/ML injection Inject 4 Units into the skin 3 (three) times daily with meals. Patient not taking: Reported on 02/05/2017 06/28/11 06/27/12  Leana Gamer, MD    Physical Exam: Vitals:   02/07/17 0045 02/07/17 0100 02/07/17 0130 02/07/17 0200  BP:  (!) 153/82 (!) 175/83 (!) 188/87  Pulse: 88 84 84 89  Resp:    18  Temp:      TempSrc:      SpO2: 98% 96% 100% 99%    Constitutional: NAD, calm, comfortable Eyes: PERRL, lids and conjunctivae normal ENMT: Mucous membranes are moist. Posterior pharynx clear of any exudate or lesions.Normal dentition.  Neck: normal, supple, no masses, no thyromegaly Respiratory: clear to auscultation bilaterally, no wheezing, no crackles. Normal respiratory effort. No accessory muscle use.  Cardiovascular: Regular rate and rhythm, no murmurs / rubs / gallops. No extremity edema. 2+ pedal pulses. No carotid bruits.  Abdomen: no tenderness, no masses palpated. No hepatosplenomegaly. Bowel sounds positive.  Musculoskeletal: no clubbing / cyanosis. No joint deformity upper and lower extremities. Good ROM, no contractures. Normal muscle tone.  Skin: no rashes, lesions, ulcers. No induration Neurologic: CN 2-12 grossly intact. Sensation intact, DTR normal. Strength 5/5 in all 4.  Psychiatric: Normal judgment and insight. Alert and oriented x 3. Normal mood.    Labs on Admission: I have personally reviewed following labs and imaging studies  CBC: Recent Labs  Lab 02/05/17 1408 02/06/17 1951  WBC 7.5 7.8  HGB 11.6* 12.2  HCT 34.8* 36.4  MCV 85.7 86.1  PLT 281 270   Basic Metabolic Panel: Recent Labs  Lab 02/05/17 1408 02/06/17 1951  NA 133* 138  K 4.6 4.1  CL 102 104  CO2 25 26  GLUCOSE 239* 116*  BUN 49* 37*  CREATININE 2.07* 1.51*  CALCIUM 9.8 10.3   GFR: CrCl cannot be calculated (Unknown ideal weight.). Liver  Function Tests: Recent Labs  Lab 02/05/17 1408 02/06/17 1951  AST 16 721*  ALT 25 872*  ALKPHOS 86 139*  BILITOT 0.4 0.7  PROT 7.5 8.5*  ALBUMIN 3.8 4.1   Recent Labs  Lab 02/05/17 1408 02/06/17 1951  LIPASE 26 20   No results for input(s): AMMONIA in the last 168 hours. Coagulation Profile: No results for input(s): INR, PROTIME in the last 168 hours. Cardiac Enzymes: No results for input(s): CKTOTAL, CKMB, CKMBINDEX, TROPONINI in the last 168 hours. BNP (last 3 results) No results for input(s): PROBNP in the last 8760 hours. HbA1C: No results for input(s): HGBA1C in the last 72 hours. CBG: Recent Labs  Lab 02/05/17 2134  GLUCAP 69   Lipid Profile: No results  for input(s): CHOL, HDL, LDLCALC, TRIG, CHOLHDL, LDLDIRECT in the last 72 hours. Thyroid Function Tests: No results for input(s): TSH, T4TOTAL, FREET4, T3FREE, THYROIDAB in the last 72 hours. Anemia Panel: No results for input(s): VITAMINB12, FOLATE, FERRITIN, TIBC, IRON, RETICCTPCT in the last 72 hours. Urine analysis:    Component Value Date/Time   COLORURINE YELLOW 02/06/2017 1951   APPEARANCEUR CLEAR 02/06/2017 1951   LABSPEC 1.016 02/06/2017 1951   PHURINE 5.0 02/06/2017 1951   GLUCOSEU 50 (A) 02/06/2017 1951   HGBUR NEGATIVE 02/06/2017 [place] NEGATIVE 02/06/2017 [place] NEGATIVE 02/06/2017 1951   PROTEINUR 100 (A) 02/06/2017 1951   UROBILINOGEN 0.2 06/23/2011 1100   NITRITE NEGATIVE 02/06/2017 1951   LEUKOCYTESUR SMALL (A) 02/06/2017 1951    Radiological Exams on Admission: US Abdomen Limited  Result Date: 02/07/2017 CLINICAL DATA:  Right upper quadrant abdominal pain EXAM: ULTRASOUND ABDOMEN LIMITED RIGHT UPPER QUADRANT COMPARISON:  02/05/2017 CT FINDINGS: Gallbladder: Surgically absent Common bile duct: Diameter: Measures up to 10.8 mm. Possible echogenic focus with shadowing at the porta hepatis. Liver: Increased echogenicity with limited evaluation of parenchyma. Portal  vein is patent on color Doppler imaging with normal direction of blood flow towards the liver. IMPRESSION: 1. Surgical absence of the gallbladder. Echogenic focus with shadowing at the porta hepatis. Uncertain if this represents surgical clips with artifact from prior cholecystectomy versus a common duct stone. Suggest correlation with LFTs, MRCP could be obtained if clinical suspicion for duct stone 2. Fatty infiltration of the liver Electronically Signed   By: Donavan Foil M.D.   On: 02/07/2017 01:14   Ct Renal Stone Study  Result Date: 02/05/2017 CLINICAL DATA:  67 year old female with right flank pain concern for kidney stone. History of left lower pole renal cell carcinoma treated with wedge resection in April 2011. EXAM: CT ABDOMEN AND PELVIS WITHOUT CONTRAST TECHNIQUE: Multidetector CT imaging of the abdomen and pelvis was performed following the standard protocol without IV contrast. COMPARISON:  Abdominal CT dated 11/08/2015 FINDINGS: Evaluation of this exam is limited in the absence of intravenous contrast. Lower chest: There are bibasilar linear atelectasis/scarring. There is a 6 mm right upper lobe subpleural nodule (series 6, image 6) similar to CT study of 06/18/2012. No intra-abdominal free air or free fluid. Hepatobiliary: The liver is unremarkable.  Cholecystectomy. Pancreas: Unremarkable. No pancreatic ductal dilatation or surrounding inflammatory changes. Spleen: Normal in size without focal abnormality. Adrenals/Urinary Tract: There is a 14 mm right adrenal adenoma. The left adrenal gland is unremarkable. Scattered punctate calcific densities in the kidneys bilaterally may represent small nonobstructing stones versus vascular calcification. Postsurgical changes of the inferior pole of the left kidney with small linear probable dystrophic calcification. There is no hydronephrosis on either side. Faint small bilateral renal hypodense lesions are not characterized on this noncontrast CT but  appears similar to prior CT. The visualized ureters and urinary bladder appear unremarkable. Stomach/Bowel: There is no bowel obstruction or active inflammation. There is scattered colonic diverticula without active inflammatory changes the appendix is normal. Vascular/Lymphatic: Mild aortoiliac atherosclerotic disease. No portal venous gas. There is no adenopathy. Reproductive: The uterus is anteverted. There is probable small uterine fibroid from the anterior body with indentation of the posterior bladder wall. Bilateral tubal ligation clips noted. Other: None Musculoskeletal: No acute osseous pathology. IMPRESSION: 1. No hydronephrosis or obstructing stone probable punctate nonobstructing bilateral renal calculi. 2. Postsurgical changes of wedge resection of the inferior pole of the left kidney similar to prior CT 3.  Multiple small scattered colonic diverticula without active inflammatory changes. No bowel obstruction. Normal appendix. 4. Mild Aortic Atherosclerosis (ICD10-I70.0). Electronically Signed   By: Anner Crete M.D.   On: 02/05/2017 22:55    EKG: Independently reviewed.  Assessment/Plan Principal Problem:   Choledocholithiasis with obstruction Active Problems:   DM (diabetes mellitus) ([place])   HTN (hypertension)   AKI (acute kidney injury) ([place])    1. Transaminitis - concern for CBD stone on [place] 1. Retained stone now obstructing CBD certainly would explain her presentation, symptoms, LFTs, etc 2. NPO 3. IVF 4. Repeat CMP in AM 5. Call GI in AM - MRCP vs ERCP 6. Got dose of empiric zosyn in ED, will hold off on further ABx for the moment since no SIRS or evidence of cholangitis. 2. Renal insufficiency - AKI vs CKD stage 3 1. Treating as AKI, creat improvement since labs yesterday noted 2. Holding Micardis 3. IVF 4. Strict intake and output 5. Repeat BMP in AM 3. DM2 - 1. Will reduce lantus to 30 units QHS (didn't take last evenings dose) 2. And mod scale SSI Q4H 3. While  patient NPO 4. HTN - 1. Continue catapres 2. Continue cardizem 3. Holding Micardis given possible AKI 4. PRN labetalol added  DVT prophylaxis: SCDs Code Status: Full Family Communication: No family in room Disposition Plan: Home after admit Consults called: None, call GI in AM Admission status: Admit to inpatient   [place], [place] [facility] Pager [phone]  If 7AM-7PM, please contact day team taking care of patient [email or web address] Password [facility]  02/07/2017, 2:56 AM

## 2017-02-08 DIAGNOSIS — E118 Type 2 diabetes mellitus with unspecified complications: Secondary | ICD-10-CM | POA: Diagnosis not present

## 2017-02-08 DIAGNOSIS — I10 Essential (primary) hypertension: Secondary | ICD-10-CM | POA: Diagnosis not present

## 2017-02-08 DIAGNOSIS — R945 Abnormal results of liver function studies: Secondary | ICD-10-CM | POA: Diagnosis not present

## 2017-02-08 DIAGNOSIS — Z794 Long term (current) use of insulin: Secondary | ICD-10-CM | POA: Diagnosis not present

## 2017-02-08 DIAGNOSIS — N179 Acute kidney failure, unspecified: Secondary | ICD-10-CM | POA: Diagnosis not present

## 2017-02-08 DIAGNOSIS — K8050 Calculus of bile duct without cholangitis or cholecystitis without obstruction: Secondary | ICD-10-CM | POA: Diagnosis not present

## 2017-02-08 LAB — COMPREHENSIVE METABOLIC PANEL
ALT: 463 U/L — ABNORMAL HIGH (ref 14–54)
ANION GAP: 4 — AB (ref 5–15)
AST: 136 U/L — ABNORMAL HIGH (ref 15–41)
Albumin: 3.2 g/dL — ABNORMAL LOW (ref 3.5–5.0)
Alkaline Phosphatase: 107 U/L (ref 38–126)
BILIRUBIN TOTAL: 0.7 mg/dL (ref 0.3–1.2)
BUN: 19 mg/dL (ref 6–20)
CHLORIDE: 108 mmol/L (ref 101–111)
CO2: 26 mmol/L (ref 22–32)
Calcium: 8.9 mg/dL (ref 8.9–10.3)
Creatinine, Ser: 1.47 mg/dL — ABNORMAL HIGH (ref 0.44–1.00)
GFR calc Af Amer: 42 mL/min — ABNORMAL LOW (ref >60)
GFR, EST NON AFRICAN AMERICAN: 36 mL/min — AB (ref >60)
Glucose, Bld: 103 mg/dL — ABNORMAL HIGH (ref 65–99)
POTASSIUM: 4.4 mmol/L (ref 3.5–5.1)
Sodium: 138 mmol/L (ref 135–145)
TOTAL PROTEIN: 6.5 g/dL (ref 6.5–8.1)

## 2017-02-08 LAB — GLUCOSE, CAPILLARY
GLUCOSE-CAPILLARY: 81 mg/dL (ref 65–99)
Glucose-Capillary: 74 mg/dL (ref 65–99)

## 2017-02-08 LAB — CBC
HEMATOCRIT: 32.8 % — AB (ref 36.0–46.0)
Hemoglobin: 10.7 g/dL — ABNORMAL LOW (ref 12.0–15.0)
MCH: 29.1 pg (ref 26.0–34.0)
MCHC: 32.6 g/dL (ref 30.0–36.0)
MCV: 89.1 fL (ref 78.0–100.0)
Platelets: 249 10*3/uL (ref 150–400)
RBC: 3.68 MIL/uL — ABNORMAL LOW (ref 3.87–5.11)
RDW: 13.9 % (ref 11.5–15.5)
WBC: 6.5 10*3/uL (ref 4.0–10.5)

## 2017-02-08 LAB — HEPATITIS PANEL, ACUTE
HEP B S AG: NEGATIVE
Hep A IgM: NEGATIVE
Hep B C IgM: NEGATIVE

## 2017-02-08 MED ORDER — POLYETHYLENE GLYCOL 3350 17 G PO PACK: 17.0000 g | PACK | Freq: Every day | ORAL | 0 refills | Status: DC

## 2017-02-08 MED ORDER — ONDANSETRON HCL 4 MG PO TABS: 4.0000 mg | ORAL_TABLET | Freq: Four times a day (QID) | ORAL | 0 refills | Status: DC | PRN

## 2017-02-08 MED ORDER — POLYETHYLENE GLYCOL 3350 17 G PO PACK
17.0000 g | PACK | Freq: Every day | ORAL | Status: DC
  Administered 2017-02-08: 17 g via ORAL
  Filled 2017-02-08: qty 1

## 2017-02-08 MED ORDER — PANTOPRAZOLE SODIUM 40 MG PO TBEC
40.0000 mg | DELAYED_RELEASE_TABLET | Freq: Every day | ORAL | Status: DC
  Administered 2017-02-08: 40 mg via ORAL
  Filled 2017-02-08: qty 1

## 2017-02-08 NOTE — Care Management Obs Status (Signed)
MEDICARE OBSERVATION STATUS NOTIFICATION   Patient Details  Name: LESHIA KOPE MRN: [id number] Date of Birth: 11-24-1950   Medicare Observation Status Notification Given:  Yes    CrutchfieldAntony Haste, RN 02/08/2017, 11:19 AM

## 2017-02-08 NOTE — Progress Notes (Signed)
Briana Garrison to be D/C'd Home per MD order.  Discussed prescriptions and follow up appointments with the patient. Prescriptions given to patient, medication list explained in detail. Pt verbalized understanding.  Allergies as of 02/08/2017      Reactions   Amlodipine Swelling      Medication List    STOP taking these medications   amLODipine 5 MG tablet Commonly known as:  NORVASC   insulin aspart 100 UNIT/ML injection Commonly known as:  novoLOG     TAKE these medications   aspirin 81 MG chewable tablet Chew 81 mg by mouth daily.   cholecalciferol 1000 units tablet Commonly known as:  VITAMIN D Take 1,000 Units by mouth daily.   cloNIDine 0.1 MG tablet Commonly known as:  CATAPRES Take 0.1-0.2 mg by mouth 2 (two) times daily. 0.1 MG in AM and 0.2 in  PM   diltiazem 180 MG 24 hr capsule Commonly known as:  CARDIZEM CD Take 180 mg by mouth daily.   glipiZIDE 5 MG 24 hr tablet Commonly known as:  GLUCOTROL XL Take 5 mg by mouth daily with breakfast.   insulin glargine 100 UNIT/ML injection Commonly known as:  LANTUS Inject 40 Units into the skin at bedtime. What changed:  how much to take   insulin lispro 100 UNIT/ML KiwkPen Commonly known as:  HUMALOG Inject 14-18 Units into the skin 3 (three) times daily. 14 units with breakfast and 18 units with supper   ondansetron 4 MG tablet Commonly known as:  ZOFRAN Take 1 tablet (4 mg total) by mouth every 6 (six) hours as needed for nausea.   polyethylene glycol packet Commonly known as:  MIRALAX Take 17 g by mouth daily.   rosuvastatin 20 MG tablet Commonly known as:  CRESTOR Take 20 mg by mouth daily.   telmisartan-hydrochlorothiazide 80-25 MG tablet Commonly known as:  MICARDIS HCT Take 1 tablet by mouth daily.   traMADol 50 MG tablet Commonly known as:  ULTRAM Take 50 mg by mouth every 6 (six) hours as needed for moderate pain or severe pain.   vitamin B-12 500 MCG tablet Commonly known as:   CYANOCOBALAMIN Take 500 mcg by mouth daily.       Vitals:   02/07/17 2027 02/08/17 0519  BP: (!) 151/64 120/62  Pulse: 72 64  Resp: 18 19  Temp: 98 F (36.7 C) 97.8 F (36.6 C)  SpO2: 94% 93%    Skin clean, dry and intact without evidence of skin break down, no evidence of skin tears noted. IV catheter discontinued intact. Site without signs and symptoms of complications. Dressing and pressure applied. Pt denies pain at this time. No complaints noted.  An After Visit Summary was printed and given to the patient. Patient escorted via [place], and D/C home via private auto.  Haywood Lasso BSN, RN [facility] Phone [phone] Briana Garrison to be D/C'd Home per MD order.  Discussed prescriptions and follow up appointments with the patient. Prescriptions given to patient, medication list explained in detail. Pt verbalized understanding.  Allergies as of 02/08/2017      Reactions   Amlodipine Swelling      Medication List    STOP taking these medications   amLODipine 5 MG tablet Commonly known as:  NORVASC   insulin aspart 100 UNIT/ML injection Commonly known as:  novoLOG     TAKE these medications   aspirin 81 MG chewable tablet Chew 81 mg by mouth daily.   cholecalciferol 1000 units  tablet Commonly known as:  VITAMIN D Take 1,000 Units by mouth daily.   cloNIDine 0.1 MG tablet Commonly known as:  CATAPRES Take 0.1-0.2 mg by mouth 2 (two) times daily. 0.1 MG in AM and 0.2 in  PM   diltiazem 180 MG 24 hr capsule Commonly known as:  CARDIZEM CD Take 180 mg by mouth daily.   glipiZIDE 5 MG 24 hr tablet Commonly known as:  GLUCOTROL XL Take 5 mg by mouth daily with breakfast.   insulin glargine 100 UNIT/ML injection Commonly known as:  LANTUS Inject 40 Units into the skin at bedtime. What changed:  how much to take   insulin lispro 100 UNIT/ML KiwkPen Commonly known as:  HUMALOG Inject 14-18 Units into the skin 3 (three) times daily. 14 units with breakfast and 18  units with supper   ondansetron 4 MG tablet Commonly known as:  ZOFRAN Take 1 tablet (4 mg total) by mouth every 6 (six) hours as needed for nausea.   polyethylene glycol packet Commonly known as:  MIRALAX Take 17 g by mouth daily.   rosuvastatin 20 MG tablet Commonly known as:  CRESTOR Take 20 mg by mouth daily.   telmisartan-hydrochlorothiazide 80-25 MG tablet Commonly known as:  MICARDIS HCT Take 1 tablet by mouth daily.   traMADol 50 MG tablet Commonly known as:  ULTRAM Take 50 mg by mouth every 6 (six) hours as needed for moderate pain or severe pain.   vitamin B-12 500 MCG tablet Commonly known as:  CYANOCOBALAMIN Take 500 mcg by mouth daily.       Vitals:   02/07/17 2027 02/08/17 0519  BP: (!) 151/64 120/62  Pulse: 72 64  Resp: 18 19  Temp: 98 F (36.7 C) 97.8 F (36.6 C)  SpO2: 94% 93%    Skin clean, dry and intact without evidence of skin break down, no evidence of skin tears noted. IV catheter discontinued intact. Site without signs and symptoms of complications. Dressing and pressure applied. Pt denies pain at this time. No complaints noted.  An After Visit Summary was printed and given to the patient. Patient escorted via [place], and D/C home via private auto.  Haywood Lasso BSN, RN [facility]  Phone [phone]

## 2017-02-08 NOTE — Discharge Summary (Signed)
Physician Discharge Summary  Briana Garrison:[id number] DOB: Mar 13, 1950 DOA: 02/06/2017  PCP: [name], MD  Admit date: 02/06/2017 Discharge date: 02/08/2017  Admitted From: home Disposition:  home  Recommendations for Outpatient Follow-up:  1. Follow up with Dr. Alessandra Bevels in 2 weeks 2. Please repeat a CMP   Home Health: none Equipment/Devices: none  Discharge Condition: stable CODE STATUS: Full code Diet recommendation: regular  HPI: Per Dr. [name], Briana Garrison is a 67 y.o. female with medical history significant of Kidney cancer, DM, HTN, cholecystectomy 8 years ago.  Patient presents to the ED with c/o R flank pain.  Radiates to lower abdomen.  Onset 9 am yesterday.  Patient came in to the ED and had CT scan which didn't reveal obvious cause.  Had creat of 2.07 and was given IVF. Today she was still unable to tolerate PO intake.  Having N/V with this so she returned to the ED. ED Course: Lab work today shows improvement in her creat to 1.5; however, her LFTs have markedly increased with ALT/AST at 721/872.  Alk phos 139.  Tylenol level neg, US abdomen demonstrates what appears to be a CBD stone vs surgical clip.  CBD is 10.82m.  Hospital Course: Transaminitis -patient was admitted to the hospital with concern for choledocholithiasis based on UKoreadone in the ED. GI was consulted and have followed patient while hospitalized. She underwent an MRCP which was negative for biliary duct dilatation or choledocholithiasis. Her LFTs started to improve and her symptoms completely resolved on admission. She is able to tolerate a regular diet without further nausea/vomiting. It is likely that she did have cholelithiasis and spontaneously passed a stone. She was discharged home in stable condition and will be followed by by GI in 2 weeks as an outpatient.  CKD 3 -baseline Cr. ~1.5 since 2013, currently at baseline DM2 -continue home regimen  HTN -continue home regimen HLD -continue  Crestor   Discharge Diagnoses:  Principal Problem:   Choledocholithiasis with obstruction Active Problems:   DM [place]diabetes mellitus) ([place]   HTN (hypertension)   AKI (acute kidney injury) ([place]  Discharge Instructions  Allergies as of 02/08/2017      Reactions   Amlodipine Swelling      Medication List    STOP taking these medications   amLODipine 5 MG tablet Commonly known as:  NORVASC   insulin aspart 100 UNIT/ML injection Commonly known as:  novoLOG     TAKE these medications   aspirin 81 MG chewable tablet Chew 81 mg by mouth daily.   cholecalciferol 1000 units tablet Commonly known as:  VITAMIN D Take 1,000 Units by mouth daily.   cloNIDine 0.1 MG tablet Commonly known as:  CATAPRES Take 0.1-0.2 mg by mouth 2 (two) times daily. 0.1 MG in AM and 0.2 in  PM   diltiazem 180 MG 24 hr capsule Commonly known as:  CARDIZEM CD Take 180 mg by mouth daily.   glipiZIDE 5 MG 24 hr tablet Commonly known as:  GLUCOTROL XL Take 5 mg by mouth daily with breakfast.   insulin glargine 100 UNIT/ML injection Commonly known as:  LANTUS Inject 40 Units into the skin at bedtime. What changed:  how much to take   insulin lispro 100 UNIT/ML KiwkPen Commonly known as:  HUMALOG Inject 14-18 Units into the skin 3 (three) times daily. 14 units with breakfast and 18 units with supper   ondansetron 4 MG tablet Commonly known as:  ZOFRAN Take 1 tablet (4  mg total) by mouth every 6 (six) hours as needed for nausea.   polyethylene glycol packet Commonly known as:  MIRALAX Take 17 g by mouth daily.   rosuvastatin 20 MG tablet Commonly known as:  CRESTOR Take 20 mg by mouth daily.   telmisartan-hydrochlorothiazide 80-25 MG tablet Commonly known as:  MICARDIS HCT Take 1 tablet by mouth daily.   traMADol 50 MG tablet Commonly known as:  ULTRAM Take 50 mg by mouth every 6 (six) hours as needed for moderate pain or severe pain.   vitamin B-12 500 MCG tablet Commonly known as:   CYANOCOBALAMIN Take 500 mcg by mouth daily.      Follow-up Information    Brahmbhatt, Parag, MD. Schedule an appointment as soon as possible for a visit in 2 week(s).   Specialty:  Gastroenterology Contact information: [place] [address] [phone]        [name], MD. Schedule an appointment as soon as possible for a visit in 3 week(s).   Specialty:  Internal Medicine Contact information: [address] [name] [address] [phone]           Consultations:  Gastroenterology   Procedures/Studies:  Mr 3d Recon At Scanner  Result Date: 02/07/2017 CLINICAL DATA:  Elevated liver function tests.  Abdominal pain. EXAM: MRI ABDOMEN WITHOUT AND WITH CONTRAST (INCLUDING MRCP) TECHNIQUE: Multiplanar multisequence MR imaging of the abdomen was performed both before and after the administration of intravenous contrast. Heavily T2-weighted images of the biliary and pancreatic ducts were obtained, and three-dimensional MRCP images were rendered by post processing. CONTRAST:  57m MULTIHANCE GADOBENATE DIMEGLUMINE 529 MG/ML IV SOLN COMPARISON:  Ultrasound of earlier today.  CT of 02/05/2017 FINDINGS: Portions of exam are minimally motion degraded. Lower chest: Mild cardiomegaly, without pericardial effusion. Mild right base scarring. Hepatobiliary: Normal liver. Cholecystectomy. Upper normal intrahepatic duct size. Normal common duct caliber for prior cholecystectomy, including at 10 mm 85/series 5. No choledocholithiasis or obstructive mass. Pancreas: Sequelae of chronic pancreatitis, with mild atrophy and side branch duct ectasia throughout. No dominant pancreatic mass or acute inflammation identified. Spleen:  Normal in size, without focal abnormality. Adrenals/Urinary Tract: Normal left adrenal gland. Right adrenal adenoma of 1.4 cm including on image 53/series 8. Bilateral renal cysts. No enhancing renal lesions. Stomach/Bowel: The  gastric antrum appears thick walled, but is underdistended. Example image 64/series 1202. Normal abdominal bowel loops. Vascular/Lymphatic: Normal caliber of the aorta and branch vessels. No retroperitoneal or retrocrural adenopathy. Other: No ascites. Fat containing midline ventral abdominal wall hernia. Musculoskeletal: Right hemidiaphragm elevation. No acute osseous abnormality. IMPRESSION: 1. Cholecystectomy, without biliary duct dilatation or choledocholithiasis. 2. Findings of chronic pancreatitis, without acute inflammation. 3. Right adrenal adenoma. 4. Possible gastritis. Apparent wall thickening could at least partially be due to underdistention. Electronically Signed   By: KAbigail MiyamotoM.D.   On: 02/07/2017 15:17   UKoreaAbdomen Limited  Result Date: 02/07/2017 CLINICAL DATA:  Right upper quadrant abdominal pain EXAM: ULTRASOUND ABDOMEN LIMITED RIGHT UPPER QUADRANT COMPARISON:  02/05/2017 CT FINDINGS: Gallbladder: Surgically absent Common bile duct: Diameter: Measures up to 10.8 mm. Possible echogenic focus with shadowing at the porta hepatis. Liver: Increased echogenicity with limited evaluation of parenchyma. Portal vein is patent on color Doppler imaging with normal direction of blood flow towards the liver. IMPRESSION: 1. Surgical absence of the gallbladder. Echogenic focus with shadowing at the porta hepatis. Uncertain if this represents surgical clips with artifact from prior cholecystectomy versus a  common duct stone. Suggest correlation with LFTs, MRCP could be obtained if clinical suspicion for duct stone 2. Fatty infiltration of the liver Electronically Signed   By: Donavan Foil M.D.   On: 02/07/2017 01:14   Mr Abdomen Mrcp Moise Boring Contast  Result Date: 02/07/2017 CLINICAL DATA:  Elevated liver function tests.  Abdominal pain. EXAM: MRI ABDOMEN WITHOUT AND WITH CONTRAST (INCLUDING MRCP) TECHNIQUE: Multiplanar multisequence MR imaging of the abdomen was performed both before and after the  administration of intravenous contrast. Heavily T2-weighted images of the biliary and pancreatic ducts were obtained, and three-dimensional MRCP images were rendered by post processing. CONTRAST:  67m MULTIHANCE GADOBENATE DIMEGLUMINE 529 MG/ML IV SOLN COMPARISON:  Ultrasound of earlier today.  CT of 02/05/2017 FINDINGS: Portions of exam are minimally motion degraded. Lower chest: Mild cardiomegaly, without pericardial effusion. Mild right base scarring. Hepatobiliary: Normal liver. Cholecystectomy. Upper normal intrahepatic duct size. Normal common duct caliber for prior cholecystectomy, including at 10 mm 85/series 5. No choledocholithiasis or obstructive mass. Pancreas: Sequelae of chronic pancreatitis, with mild atrophy and side branch duct ectasia throughout. No dominant pancreatic mass or acute inflammation identified. Spleen:  Normal in size, without focal abnormality. Adrenals/Urinary Tract: Normal left adrenal gland. Right adrenal adenoma of 1.4 cm including on image 53/series 8. Bilateral renal cysts. No enhancing renal lesions. Stomach/Bowel: The gastric antrum appears thick walled, but is underdistended. Example image 64/series 1202. Normal abdominal bowel loops. Vascular/Lymphatic: Normal caliber of the aorta and branch vessels. No retroperitoneal or retrocrural adenopathy. Other: No ascites. Fat containing midline ventral abdominal wall hernia. Musculoskeletal: Right hemidiaphragm elevation. No acute osseous abnormality. IMPRESSION: 1. Cholecystectomy, without biliary duct dilatation or choledocholithiasis. 2. Findings of chronic pancreatitis, without acute inflammation. 3. Right adrenal adenoma. 4. Possible gastritis. Apparent wall thickening could at least partially be due to underdistention. Electronically Signed   By: KAbigail MiyamotoM.D.   On: 02/07/2017 15:17   Ct Renal Stone Study  Result Date: 02/05/2017 CLINICAL DATA:  67year old female with right flank pain concern for kidney stone. History  of left lower pole renal cell carcinoma treated with wedge resection in April 2011. EXAM: CT ABDOMEN AND PELVIS WITHOUT CONTRAST TECHNIQUE: Multidetector CT imaging of the abdomen and pelvis was performed following the standard protocol without IV contrast. COMPARISON:  Abdominal CT dated 11/08/2015 FINDINGS: Evaluation of this exam is limited in the absence of intravenous contrast. Lower chest: There are bibasilar linear atelectasis/scarring. There is a 6 mm right upper lobe subpleural nodule (series 6, image 6) similar to CT study of 06/18/2012. No intra-abdominal free air or free fluid. Hepatobiliary: The liver is unremarkable.  Cholecystectomy. Pancreas: Unremarkable. No pancreatic ductal dilatation or surrounding inflammatory changes. Spleen: Normal in size without focal abnormality. Adrenals/Urinary Tract: There is a 14 mm right adrenal adenoma. The left adrenal gland is unremarkable. Scattered punctate calcific densities in the kidneys bilaterally may represent small nonobstructing stones versus vascular calcification. Postsurgical changes of the inferior pole of the left kidney with small linear probable dystrophic calcification. There is no hydronephrosis on either side. Faint small bilateral renal hypodense lesions are not characterized on this noncontrast CT but appears similar to prior CT. The visualized ureters and urinary bladder appear unremarkable. Stomach/Bowel: There is no bowel obstruction or active inflammation. There is scattered colonic diverticula without active inflammatory changes the appendix is normal. Vascular/Lymphatic: Mild aortoiliac atherosclerotic disease. No portal venous gas. There is no adenopathy. Reproductive: The uterus is anteverted. There is probable small uterine fibroid from the anterior body  with indentation of the posterior bladder wall. Bilateral tubal ligation clips noted. Other: None Musculoskeletal: No acute osseous pathology. IMPRESSION: 1. No hydronephrosis or  obstructing stone probable punctate nonobstructing bilateral renal calculi. 2. Postsurgical changes of wedge resection of the inferior pole of the left kidney similar to prior CT 3. Multiple small scattered colonic diverticula without active inflammatory changes. No bowel obstruction. Normal appendix. 4. Mild Aortic Atherosclerosis (ICD10-I70.0). Electronically Signed   By: Anner Crete M.D.   On: 02/05/2017 22:55     Subjective: - no chest pain, shortness of breath, no abdominal pain, nausea or vomiting.   Discharge Exam: Vitals:   02/07/17 2027 02/08/17 0519  BP: (!) 151/64 120/62  Pulse: 72 64  Resp: 18 19  Temp: 98 F (36.7 C) 97.8 F (36.6 C)  SpO2: 94% 93%    General: Pt is alert, awake, not in acute distress Cardiovascular: RRR, S1/S2 +, no rubs, no gallops Respiratory: CTA bilaterally, no wheezing, no rhonchi Abdominal: Soft, NT, ND, bowel sounds + Extremities: no edema, no cyanosis    The results of significant diagnostics from this hospitalization (including imaging, microbiology, ancillary and laboratory) are listed below for reference.     Microbiology: No results found for this or any previous visit (from the past 240 hour(s)).   Labs: BNP (last 3 results) No results for input(s): BNP in the last 8760 hours. Basic Metabolic Panel: Recent Labs  Lab 02/05/17 1408 02/06/17 1951 02/07/17 0618 02/08/17 0707  NA 133* 138 138 138  K 4.6 4.1 4.0 4.4  CL 102 104 107 108  CO2 25 26 24 26   GLUCOSE 239* 116* 127* 103*  BUN 49* 37* 28* 19  CREATININE 2.07* 1.51* 1.41* 1.47*  CALCIUM 9.8 10.3 9.2 8.9   Liver Function Tests: Recent Labs  Lab 02/05/17 1408 02/06/17 1951 02/07/17 0618 02/08/17 0707  AST 16 721* 392* 136*  ALT 25 872* 619* 463*  ALKPHOS 86 139* 115 107  BILITOT 0.4 0.7 0.4 0.7  PROT 7.5 8.5* 6.8 6.5  ALBUMIN 3.8 4.1 3.4* 3.2*   Recent Labs  Lab 02/05/17 1408 02/06/17 1951  LIPASE 26 20   No results for input(s): AMMONIA in the  last 168 hours. CBC: Recent Labs  Lab 02/05/17 1408 02/06/17 1951 02/07/17 0618 02/08/17 0707  WBC 7.5 7.8 7.4 6.5  HGB 11.6* 12.2 10.6* 10.7*  HCT 34.8* 36.4 31.7* 32.8*  MCV 85.7 86.1 87.3 89.1  PLT 281 305 246 249   Cardiac Enzymes: No results for input(s): CKTOTAL, CKMB, CKMBINDEX, TROPONINI in the last 168 hours. BNP: Invalid input(s): POCBNP CBG: Recent Labs  Lab 02/07/17 1603 02/07/17 2025 02/07/17 2303 02/08/17 0337 02/08/17 0851  GLUCAP 204* 204* 102* 74 81   D-Dimer No results for input(s): DDIMER in the last 72 hours. Hgb A1c No results for input(s): HGBA1C in the last 72 hours. Lipid Profile No results for input(s): CHOL, HDL, LDLCALC, TRIG, CHOLHDL, LDLDIRECT in the last 72 hours. Thyroid function studies No results for input(s): TSH, T4TOTAL, T3FREE, THYROIDAB in the last 72 hours.  Invalid input(s): FREET3 Anemia work up No results for input(s): VITAMINB12, FOLATE, FERRITIN, TIBC, IRON, RETICCTPCT in the last 72 hours. Urinalysis    Component Value Date/Time   COLORURINE YELLOW 02/06/2017 1951   APPEARANCEUR CLEAR 02/06/2017 1951   LABSPEC 1.016 02/06/2017 1951   PHURINE 5.0 02/06/2017 1951   GLUCOSEU 50 (A) 02/06/2017 [place] NEGATIVE 02/06/2017 [place] NEGATIVE 02/06/2017 1951   KETONESUR  NEGATIVE 02/06/2017 1951   PROTEINUR 100 (A) 02/06/2017 1951   UROBILINOGEN 0.2 06/23/2011 1100   NITRITE NEGATIVE 02/06/2017 1951   LEUKOCYTESUR SMALL (A) 02/06/2017 1951   Sepsis Labs Invalid input(s): PROCALCITONIN,  WBC,  LACTICIDVEN   Time coordinating discharge: 35 minutes  SIGNED:  Marzetta Board, MD  [facility] 02/08/2017, 12:54 PM Pager [phone]  If 7PM-7AM, please contact night-coverage [email or web address] Password [email or web address]

## 2017-02-08 NOTE — Care Management Note (Addendum)
Case Management Note  Patient Details  Name: Briana Garrison MRN: [id number] Date of Birth: 1950-08-16  Subjective/Objective: 67 y.o. F to be discharged home with daughter to home in [place] as power is out in [place], [place]. Denies need for DME or HH.                    Action/Plan: CM will sign off for now but will be available should additional discharge needs arise or disposition change.    Expected Discharge Date:  02/08/17               Expected Discharge Plan:     In-House Referral:     Discharge planning Services     Post Acute Care Choice:    Choice offered to:     DME Arranged:    DME Agency:     HH Arranged:    HH Agency:     Status of Service:     If discussed at [employer], dates discussed:    Additional Comments:  Delrae Sawyers, RN 02/08/2017, 11:21 AM

## 2017-02-08 NOTE — Care Management CC44 (Signed)
Condition Code 44 Documentation Completed  Patient Details  Name: Briana Garrison MRN: [id number] Date of Birth: Feb 05, 1950   Condition Code 44 given:  Yes Patient signature on Condition Code 44 notice:  Yes Documentation of 2 MD's agreement:  Yes Code 44 added to claim:  Yes    Tona Qualley, Antony Haste, RN 02/08/2017, 11:20 AM

## 2017-02-08 NOTE — Discharge Instructions (Signed)
Follow with [name], MD in 3-4 weeks  Please get a complete blood count and chemistry panel checked by your Primary MD at your next visit, and again as instructed by your Primary MD. Please get your medications reviewed and adjusted by your Primary MD.  Please request your Primary MD to go over all Hospital Tests and Procedure/Radiological results at the follow up, please get all Hospital records sent to your Prim MD by signing hospital release before you go home.  If you had Pneumonia of Lung problems at the Hospital: Please get a 2 view Chest X ray done in 6-8 weeks after hospital discharge or sooner if instructed by your Primary MD.  If you have Congestive Heart Failure: Please call your Cardiologist or Primary MD anytime you have any of the following symptoms:  1) 3 pound weight gain in 24 hours or 5 pounds in 1 week  2) shortness of breath, with or without a dry hacking cough  3) swelling in the hands, feet or stomach  4) if you have to sleep on extra pillows at night in order to breathe  Follow cardiac low salt diet and 1.5 lit/day fluid restriction.  If you have diabetes Accuchecks 4 times/day, Once in AM empty stomach and then before each meal. Log in all results and show them to your primary doctor at your next visit. If any glucose reading is under 80 or above 300 call your primary MD immediately.  If you have Seizure/Convulsions/Epilepsy: Please do not drive, operate heavy machinery, participate in activities at heights or participate in high speed sports until you have seen by Primary MD or a Neurologist and advised to do so again.  If you had Gastrointestinal Bleeding: Please ask your Primary MD to check a complete blood count within one week of discharge or at your next visit. Your endoscopic/colonoscopic biopsies that are pending at the time of discharge, will also need to followed by your Primary MD.  Get Medicines reviewed and adjusted. Please take all your  medications with you for your next visit with your Primary MD  Please request your Primary MD to go over all hospital tests and procedure/radiological results at the follow up, please ask your Primary MD to get all Hospital records sent to his/her office.  If you experience worsening of your admission symptoms, develop shortness of breath, life threatening emergency, suicidal or homicidal thoughts you must seek medical attention immediately by calling 911 or calling your MD immediately  if symptoms less severe.  You must read complete instructions/literature along with all the possible adverse reactions/side effects for all the Medicines you take and that have been prescribed to you. Take any new Medicines after you have completely understood and accpet all the possible adverse reactions/side effects.   Do not drive or operate heavy machinery when taking Pain medications.   Do not take more than prescribed Pain, Sleep and Anxiety Medications  Special Instructions: If you have smoked or chewed Tobacco  in the last 2 yrs please stop smoking, stop any regular Alcohol  and or any Recreational drug use.  Wear Seat belts while driving.  Please note You were cared for by a hospitalist during your hospital stay. If you have any questions about your discharge medications or the care you received while you were in the hospital after you are discharged, you can call the unit and asked to speak with the hospitalist on call if the hospitalist that took care of you is not available. Once  you are discharged, your primary care physician will handle any further medical issues. Please note that NO REFILLS for any discharge medications will be authorized once you are discharged, as it is imperative that you return to your primary care physician (or establish a relationship with a primary care physician if you do not have one) for your aftercare needs so that they can reassess your need for medications and monitor your  lab values.  You can reach the hospitalist office at phone [phone] or fax [phone]   If you do not have a primary care physician, you can call [phone] for a physician referral.  Activity: As tolerated with Full fall precautions use walker/cane & assistance as needed  Diet: diabetic  Disposition Home

## 2017-02-08 NOTE — Progress Notes (Signed)
[facility] Gastroenterology Progress Note  STARLIT RABURN 67 y.o. 21-Aug-1950  CC:  Abdominal pain, abnormal LFTs   Subjective: Patient feeling better now. Denied abdominal pain. No bowel movement since admission. No prior history of blood in the stool or black stool.  ROS : Negative for chest pain and shortness of breath   Objective: Vital signs in last 24 hours: Vitals:   02/07/17 2027 02/08/17 0519  BP: (!) 151/64 120/62  Pulse: 72 64  Resp: 18 19  Temp: 98 F (36.7 C) 97.8 F (36.6 C)  SpO2: 94% 93%    Physical Exam:  General:  Alert, cooperative, no distress, appears stated age  Head:  Normocephalic, without obvious abnormality, atraumatic  Eyes:  , EOM's intact,   Lungs:   Clear to auscultation bilaterally, respirations unlabored  Heart:  Regular rate and rhythm, S1, S2 normal  Abdomen:   Soft, non-tender, nondistended, bowel sounds present, no peritoneal signs   Extremities:  trace  edema  Pulses: 2+ and symmetric    Lab Results: Recent Labs    02/07/17 0618 02/08/17 0707  NA 138 138  K 4.0 4.4  CL 107 108  CO2 24 26  GLUCOSE 127* 103*  BUN 28* 19  CREATININE 1.41* 1.47*  CALCIUM 9.2 8.9   Recent Labs    02/07/17 0618 02/08/17 0707  AST 392* 136*  ALT 619* 463*  ALKPHOS 115 107  BILITOT 0.4 0.7  PROT 6.8 6.5  ALBUMIN 3.4* 3.2*   Recent Labs    02/07/17 0618 02/08/17 0707  WBC 7.4 6.5  HGB 10.6* 10.7*  HCT 31.7* 32.8*  MCV 87.3 89.1  PLT 246 249   No results for input(s): LABPROT, INR in the last 72 hours.    Assessment/Plan: - Abnormal LFTs.AST 721, ALT 872, , alkaline phosphatase 139, Normal total bilirubin. Normal LFts two days ago.Ultrasound abdomen  showed prior cholecystectomy. Echogenic focus with shadowing at the porta hepatis and fatty liver. ?? Passed stone.  - Abdominal pain. Right flank pain with radiation towards right side. CT scan 02/05/2017 showed no acute changes. Resolved - Normocytic anemia. ?? Chronic. No evidence of  overt bleeding. - ? Gastritis based on MRI finding. Wall thickening of gastric antrum which could be from under distention. CT scan on 02/05/2017 showed no gastric abnormality. - Family history of colon cancer in sister. Last colonoscopy 2 years ago at [facility]. -  Constipation.  Recommendations ------------------------- -  MRI MRCP showed changes of chronic pancreatitis. No biliary ductal dilatation or choledocholithiasis. She may have passed the stone. - Patient is asymptomatic now. LFTs improving. Tolerating regular diet - Start MiraLAX for constipation - Start Protonix once a day. Avoid NSAIDs - Recommend follow-up in GI clinic in 2 weeks to monitor LFTs and hemoglobin. - Consider outpatient EGD for evaluation of abnormal MRI finding showing thickening of the gastric antrum which could be just from under distention. - Okay to discharge from GI standpoint. GI will sign off. Call us back if needed   Otis Brace MD, [place] 02/08/2017, 9:13 AM  Contact #  [phone]

## 2017-06-07 ENCOUNTER — Emergency Department (HOSPITAL_COMMUNITY)
Admission: EM | Admit: 2017-06-07 | Discharge: 2017-06-07 | Disposition: A | Discharge status: Home / Self Care | Payer: Medicare Other | Attending: Emergency Medicine | Admitting: Emergency Medicine

## 2017-06-07 ENCOUNTER — Emergency Department (HOSPITAL_COMMUNITY): Payer: Medicare Other

## 2017-06-07 ENCOUNTER — Encounter (HOSPITAL_COMMUNITY): Payer: Self-pay | Admitting: Emergency Medicine

## 2017-06-07 ENCOUNTER — Other Ambulatory Visit: Payer: Self-pay

## 2017-06-07 DIAGNOSIS — Z794 Long term (current) use of insulin: Secondary | ICD-10-CM | POA: Diagnosis not present

## 2017-06-07 DIAGNOSIS — Z79899 Other long term (current) drug therapy: Secondary | ICD-10-CM | POA: Diagnosis not present

## 2017-06-07 DIAGNOSIS — R6883 Chills (without fever): Secondary | ICD-10-CM | POA: Diagnosis not present

## 2017-06-07 DIAGNOSIS — R61 Generalized hyperhidrosis: Secondary | ICD-10-CM | POA: Insufficient documentation

## 2017-06-07 DIAGNOSIS — J40 Bronchitis, not specified as acute or chronic: Secondary | ICD-10-CM | POA: Diagnosis not present

## 2017-06-07 DIAGNOSIS — Z7982 Long term (current) use of aspirin: Secondary | ICD-10-CM | POA: Diagnosis not present

## 2017-06-07 DIAGNOSIS — R05 Cough: Secondary | ICD-10-CM | POA: Diagnosis present

## 2017-06-07 DIAGNOSIS — I10 Essential (primary) hypertension: Secondary | ICD-10-CM | POA: Diagnosis not present

## 2017-06-07 DIAGNOSIS — E119 Type 2 diabetes mellitus without complications: Secondary | ICD-10-CM | POA: Insufficient documentation

## 2017-06-07 LAB — CBG MONITORING, ED: GLUCOSE-CAPILLARY: 160 mg/dL — AB (ref 65–99)

## 2017-06-07 MED ORDER — IPRATROPIUM-ALBUTEROL 0.5-2.5 (3) MG/3ML IN SOLN
3.0000 mL | Freq: Once | RESPIRATORY_TRACT | Status: AC
  Administered 2017-06-07: 3 mL via RESPIRATORY_TRACT
  Filled 2017-06-07: qty 3

## 2017-06-07 MED ORDER — INSULIN ASPART 100 UNIT/ML ~~LOC~~ SOLN
14.0000 [IU] | Freq: Once | SUBCUTANEOUS | Status: AC
  Administered 2017-06-07: 14 [IU] via SUBCUTANEOUS
  Filled 2017-06-07: qty 1

## 2017-06-07 MED ORDER — PREDNISONE 20 MG PO TABS: 60.0000 mg | ORAL_TABLET | Freq: Every day | ORAL | 0 refills | Status: DC

## 2017-06-07 MED ORDER — PREDNISONE 20 MG PO TABS
60.0000 mg | ORAL_TABLET | Freq: Once | ORAL | Status: AC
  Administered 2017-06-07: 60 mg via ORAL
  Filled 2017-06-07: qty 3

## 2017-06-07 MED ORDER — ALBUTEROL SULFATE HFA 108 (90 BASE) MCG/ACT IN AERS: 2.0000 | INHALATION_SPRAY | RESPIRATORY_TRACT | 0 refills | Status: DC | PRN

## 2017-06-07 MED ORDER — HYDROCODONE-ACETAMINOPHEN 5-325 MG PO TABS: 1.0000 | ORAL_TABLET | ORAL | 0 refills | Status: DC | PRN

## 2017-06-07 MED ORDER — IPRATROPIUM-ALBUTEROL 0.5-2.5 (3) MG/3ML IN SOLN
3.0000 mL | Freq: Once | RESPIRATORY_TRACT | Status: AC
Start: 2017-06-07 — End: 2017-06-07
  Administered 2017-06-07: 3 mL via RESPIRATORY_TRACT
  Filled 2017-06-07: qty 3

## 2017-06-07 MED ORDER — INSULIN ASPART PROT & ASPART (70-30 MIX) 100 UNIT/ML ~~LOC~~ SUSP: 14.0000 [IU] | Freq: Once | SUBCUTANEOUS | Status: DC

## 2017-06-07 NOTE — ED Provider Notes (Signed)
[facility] DEPT Provider Note   CSN: [id number] Arrival date & time: 06/07/17  0217     History   Chief Complaint Chief Complaint  Patient presents with  . URI  . Cough    HPI Briana Garrison is a 67 y.o. female.  The history is provided by the patient.  She has history of hypertension, diabetes, hyperlipidemia, kidney cancer, GERD and comes in with cough over the last 3 days.  Cough is productive of a very small amount of pale yellow sputum.  She has not had any fever, but has had some chills and sweats.  She will have severe coughing paroxysms during which she is unable to breathe.  Her abdomen is sore from coughing, but there is no other pain.  She has not had any vomiting or diarrhea.  There have been no known sick contacts.  She saw her PCP 2 days ago and was diagnosed with a respiratory tract infection and given a prescription for azithromycin and promethazine-phenylephrine cough syrup, which has not given her any relief.  Past Medical History:  Diagnosis Date  . Cancer of kidney ([place]) 05/14/2009  . Diabetes mellitus   . Endometrial mass   . GERD (gastroesophageal reflux disease)   . Hypercholesterolemia   . Hypertension   . Internal hemorrhoids    S/P colonoscopy 06/10/00 by Dr. Lajoyce Corners  . Shingles     Patient Active Problem List   Diagnosis Date Noted  . Choledocholithiasis with obstruction 02/07/2017  . AKI (acute kidney injury) ([place]) 02/07/2017  . ARF (acute renal failure) ([place]) 06/23/2011  . Hypoglycemia associated with diabetes ([place]) 06/23/2011  . DM (diabetes mellitus) ([place]) 06/23/2011  . HTN (hypertension) 06/23/2011  . Hyperlipidemia 06/23/2011  . Normocytic anemia 06/23/2011  . UTI (lower urinary tract infection) 06/23/2011    Past Surgical History:  Procedure Laterality Date  . CHOLECYSTECTOMY  11/13/08  . CYSTOSCOPY KIDNEY W/ URETERAL GUIDE WIRE  05/14/09  . Diagnostic hysteroscopy, D&C, endometrial polypectomy  07/01/2005    . LIPECTOMY       OB History   None      Home Medications    Prior to Admission medications   Medication Sig Start Date End Date Taking? Authorizing Provider  aspirin 81 MG chewable tablet Chew 81 mg by mouth daily.    [provider]  cholecalciferol (VITAMIN D) 1000 UNITS tablet Take 1,000 Units by mouth daily.     [provider]  cloNIDine (CATAPRES) 0.1 MG tablet Take 0.1-0.2 mg by mouth 2 (two) times daily. 0.1 MG in AM and 0.2 in  PM    [provider]  diltiazem (CARDIZEM CD) 180 MG 24 hr capsule Take 180 mg by mouth daily.    [provider]  glipiZIDE (GLUCOTROL XL) 5 MG 24 hr tablet Take 5 mg by mouth daily with breakfast.    [provider]  insulin glargine (LANTUS) 100 UNIT/ML injection Inject 40 Units into the skin at bedtime. Patient taking differently: Inject 64 Units into the skin at bedtime.  06/28/11   Leana Gamer, MD  insulin lispro (HUMALOG) 100 UNIT/ML KiwkPen Inject 14-18 Units into the skin 3 (three) times daily. 14 units with breakfast and 18 units with supper    [provider]  ondansetron (ZOFRAN) 4 MG tablet Take 1 tablet (4 mg total) by mouth every 6 (six) hours as needed for nausea. 02/08/17   Caren Griffins, MD  polyethylene glycol [facility]) packet Take 17  g by mouth daily. 02/08/17   Caren Griffins, MD  rosuvastatin (CRESTOR) 20 MG tablet Take 20 mg by mouth daily.    [provider]  telmisartan-hydrochlorothiazide (MICARDIS HCT) 80-25 MG tablet Take 1 tablet by mouth daily.    [provider]  traMADol (ULTRAM) 50 MG tablet Take 50 mg by mouth every 6 (six) hours as needed for moderate pain or severe pain.    [provider]  vitamin B-12 (CYANOCOBALAMIN) 500 MCG tablet Take 500 mcg by mouth daily.    [provider]    Family History Family History  Problem Relation Age of Onset  . Ovarian cancer Mother   . Stroke Mother   . Stroke Father   .  Multiple sclerosis Sister   . Stroke Brother     Social History Social History   Tobacco Use  . Smoking status: Never Smoker  . Smokeless tobacco: Never Used  Substance Use Topics  . Alcohol use: No  . Drug use: No     Allergies   Amlodipine   Review of Systems Review of Systems  All other systems reviewed and are negative.    Physical Exam Updated Vital Signs BP (!) 171/79 (BP Location: Left Arm)   Pulse 86   Temp 98.3 F (36.8 C) (Oral)   Resp 16   Ht 5\' 1"  (1.549 m)   Wt 81.6 kg (180 lb)   SpO2 93%   BMI 34.01 kg/m   Physical Exam  Nursing note and vitals reviewed.  67 year old female, resting comfortably and in no acute distress. Vital signs are significant for elevated systolic blood pressure. Oxygen saturation is 93%, which is normal.  She is coughing frequently. Head is normocephalic and atraumatic. PERRLA, EOMI. Oropharynx is clear. Neck is nontender and supple without adenopathy or JVD. Back is nontender and there is no CVA tenderness. Lungs have a slightly prolonged exhalation phase with faint expiratory wheezes, but no rales or rhonchi. Chest is nontender. Heart has regular rate and rhythm without murmur. Abdomen is soft, flat, nontender without masses or hepatosplenomegaly and peristalsis is normoactive. Extremities have no cyanosis or edema, full range of motion is present. Skin is warm and dry without rash. Neurologic: Mental status is normal, cranial nerves are intact, there are no motor or sensory deficits.  ED Treatments / Results  Labs (all labs ordered are listed, but only abnormal results are displayed) Labs Reviewed - No data to display   Radiology Dg Chest 2 View  Result Date: 06/07/2017 CLINICAL DATA:  67 year old female with cough. EXAM: CHEST - 2 VIEW COMPARISON:  Chest radiograph dated 06/18/2012 FINDINGS: There is shallow inspiration with minimal bibasilar atelectasis. No focal consolidation, pleural effusion, or pneumothorax.  The cardiac silhouette is within normal limits. No acute osseous pathology. IMPRESSION: Shallow inspiration with bibasilar atelectasis. No focal consolidation. Electronically Signed   By: Anner Crete M.D.   On: 06/07/2017 05:25    Procedures Procedures   Medications Ordered in ED Medications  ipratropium-albuterol (DUONEB) 0.5-2.5 (3) MG/3ML nebulizer solution 3 mL (has no administration in time range)     Initial Impression / Assessment and Plan / ED Course  I have reviewed the triage vital signs and the nursing notes.  Pertinent labs & imaging results that were available during my care of the patient were reviewed by me and considered in my medical decision making (see chart for details).  Cough-bronchitis versus pneumonia.  Will send for chest x-ray to look for evidence  of pneumonia.  There is evidence of bronchospasm on exam, so she will be given nebulizer treatment with albuterol and ipratropium.  Old records are reviewed, and she has no relevant past visits.  6:44 AM Following above-noted treatment, patient is no longer having severe coughing paroxysms.  On exam, there is slightly improved airflow, and moderate wheezing is present.  She will be given a second albuterol with ipratropium nebulizer treatment.  We will also give dose of prednisone.  Chest x-ray shows no evidence of pneumonia, no indication for antibiotics at this point.  8:16 AM There was further improvement with second albuterol treatment, but she is still somewhat dyspneic.  She will be ambulated to see if she is able to tolerate walking and if she desaturates while walking.  If she passes this test, she will be discharged with prescriptions for albuterol inhaler and prednisone as well as a small number of hydrocodone-acetaminophen tablets to use to suppress cough at bedtime.  If she fails the test, she will be admitted.  Case is signed out to Dr. Winfred Leeds.  Final Clinical Impressions(s) / ED Diagnoses   Final  diagnoses:  None    ED Discharge Orders        Ordered    predniSONE (DELTASONE) 20 MG tablet  Daily     06/07/17 0813    albuterol (PROVENTIL HFA;VENTOLIN HFA) 108 (90 Base) MCG/ACT inhaler  Every 4 hours PRN     06/07/17 0813    HYDROcodone-acetaminophen (NORCO) 5-325 MG tablet  Every 4 hours PRN     22/48/25 0037       Delora Fuel, MD 04/88/89 2241

## 2017-06-07 NOTE — ED Notes (Signed)
Pt ambulated to bathroom and around the ED, pt's oxygen ranged from 95% to 97%. Pt's respiratory rate was around 26. Pt having hard time breathing by the time she reached her room.

## 2017-06-07 NOTE — ED Notes (Signed)
Patient transported to X-ray 

## 2017-06-07 NOTE — ED Triage Notes (Signed)
Pt seen recently and diagnosed with a URI. Patient started on an antibiotic but no steroids. Patient states that her symptoms are not improving and her cough is now "junky." Patient also complaining of a sore throat. Patients voice is hoarse on assessment.

## 2017-06-07 NOTE — ED Notes (Signed)
Pt provided sandwich and orange juice 

## 2017-06-07 NOTE — ED Provider Notes (Signed)
9:15 AM patient ambulates around the emergency department she coughs occasionally.  She speaks in paragraphs.  Maintains normal pulse oximetry she appears to be in no respiratory distress.  He prefers to go home.  She is told to use her albuterol inhaler 2 puffs every 4 hours as needed.  Return if needed more than every 4 hours   Orlie Dakin, MD 06/07/17 [phone]

## 2017-06-07 NOTE — Discharge Instructions (Addendum)
Use the inhaler - two puffs, every four hours as needed for coughing or difficulty breathing.   Take the hydrocodone-acetaminophen to help suppress the cough at night - it can be taken as often as every six hours.  Return if symptoms are getting worse or if inhaler is needed more than every 4 hours or if concern for any reason.

## 2018-07-06 ENCOUNTER — Ambulatory Visit (INDEPENDENT_AMBULATORY_CARE_PROVIDER_SITE_OTHER): Payer: Medicare Other | Admitting: Internal Medicine

## 2018-07-08 ENCOUNTER — Ambulatory Visit (INDEPENDENT_AMBULATORY_CARE_PROVIDER_SITE_OTHER): Payer: Medicare Other | Admitting: Internal Medicine

## 2018-07-14 ENCOUNTER — Encounter (INDEPENDENT_AMBULATORY_CARE_PROVIDER_SITE_OTHER): Payer: Self-pay | Admitting: Internal Medicine

## 2018-07-14 ENCOUNTER — Other Ambulatory Visit: Payer: Self-pay

## 2018-07-14 ENCOUNTER — Ambulatory Visit (INDEPENDENT_AMBULATORY_CARE_PROVIDER_SITE_OTHER): Payer: Medicare Other | Admitting: Internal Medicine

## 2018-07-14 VITALS — BP 160/76 | HR 109 | Temp 98.5°F | Ht 61.0 in | Wt 204.9 lb

## 2018-07-14 DIAGNOSIS — R195 Other fecal abnormalities: Secondary | ICD-10-CM | POA: Diagnosis not present

## 2018-07-14 DIAGNOSIS — K625 Hemorrhage of anus and rectum: Secondary | ICD-10-CM

## 2018-07-14 NOTE — Progress Notes (Addendum)
Subjective:    Patient ID: Briana Garrison, female    DOB: 20-Jun-1950, 68 y.o.   MRN: [id number]  HPI Referred by [name] for change in stool/bloating. She tells me she has been having abdominal swelling and constipation. Has been taking Linzess for constipation. Hx of constipation on and off for about a year.  She eats plenty of vegetables and greens. She tells her abdomen is swollen. Abdominal distention for 6 months. Has been to GYN in December and exam was normal.  Her appetite is okay. She has had weight gain.  She has a BM every other day. Takes Linzess as needed. She says her BMs a re small.  Her last colonoscopy ?2 yrs in [place] and was told it was normal.  Patient would like to proceed with a colonoscopy.  She had a negative cologuard 04/09/2016   Hx significant for HTN, DM, and hx of kidney cancer.  06/24/2018 H and H 11.1 and 34.0  Review of Systems Past Medical History:  Diagnosis Date  . Cancer of kidney ([place]) 05/14/2009  . Diabetes mellitus   . Endometrial mass   . GERD (gastroesophageal reflux disease)   . Hypercholesterolemia   . Hypertension   . Internal hemorrhoids    S/P colonoscopy 06/10/00 by Dr. Lajoyce Corners  . Shingles     Past Surgical History:  Procedure Laterality Date  . CHOLECYSTECTOMY  11/13/08  . CYSTOSCOPY KIDNEY W/ URETERAL GUIDE WIRE  05/14/09  . Diagnostic hysteroscopy, D&C, endometrial polypectomy  07/01/2005  . LIPECTOMY      Allergies  Allergen Reactions  . Amlodipine Swelling    Current Outpatient Medications on File Prior to Visit  Medication Sig Dispense Refill  . albuterol (PROVENTIL HFA;VENTOLIN HFA) 108 (90 Base) MCG/ACT inhaler Inhale 2 puffs into the lungs every 4 (four) hours as needed for wheezing or shortness of breath (or coughing). 1 Inhaler 0  . aspirin EC 81 MG tablet Take 81 mg by mouth daily.    Marland Kitchen CARTIA XT 240 MG 24 hr capsule Take 240 mg by mouth daily.  12  . cholecalciferol (VITAMIN D) 1000 UNITS tablet Take 1,000  Units by mouth 2 (two) times daily.     . cloNIDine (CATAPRES) 0.1 MG tablet Take 0.1-0.2 mg by mouth 2 (two) times daily. 0.1 MG in AM and 0.2 MG in  PM    . furosemide (LASIX) 20 MG tablet Take 20 mg by mouth daily.     . hydrochlorothiazide (MICROZIDE) 12.5 MG capsule Take 12.5 mg by mouth daily.    . insulin glargine (LANTUS) 100 UNIT/ML injection Inject 40 Units into the skin at bedtime. (Patient taking differently: Inject 60 Units into the skin at bedtime. 20 in am and 40 in pm) 10 mL 0  . insulin lispro (HUMALOG) 100 UNIT/ML KiwkPen Inject 14-18 Units into the skin 3 (three) times daily. 14 units with breakfast and lunch, 18 units with supper    . linaclotide (LINZESS) 145 MCG CAPS capsule Take 145 mcg by mouth daily before breakfast.    . predniSONE (DELTASONE) 20 MG tablet Take 3 tablets (60 mg total) by mouth daily. 15 tablet 0  . telmisartan (MICARDIS) 40 MG tablet Take 40 mg by mouth daily.    . vitamin B-12 (CYANOCOBALAMIN) 500 MCG tablet Take 500 mcg by mouth daily.    Marland Kitchen HYDROcodone-acetaminophen (NORCO) 5-325 MG tablet Take 1 tablet by mouth every 4 (four) hours as needed for moderate pain (or coughing). (Patient not taking: Reported  on 07/14/2018) 10 tablet 0   No current facility-administered medications on file prior to visit.         Objective:   Physical Exam Blood pressure (!) 160/76, pulse (!) 109, temperature 98.5 F (36.9 C), height 5\' 1"  (1.549 m), weight 204 lb 14.4 oz (92.9 kg). Alert and oriented. Skin warm and dry. Oral mucosa is moist.   . Sclera anicteric, conjunctivae is pink. Thyroid not enlarged. No cervical lymphadenopathy. Lungs clear. Heart regular rate and rhythm.  Abdomen is soft. Bowel sounds are positive. No hepatomegaly. No abdominal masses felt. No tenderness.  1+ edema to lower extremities.Rectal: No mass. Guaiac negative.  (Abdomen does not appear to be distended). Obese.       Assessment & Plan:  Change in stool. Occasionally rectal bleeding.  Will go ahead an schedule a colonoscopy to rule out colonic neoplasm. Will try to locate last colonoscopy.

## 2018-07-27 ENCOUNTER — Encounter (INDEPENDENT_AMBULATORY_CARE_PROVIDER_SITE_OTHER): Payer: Self-pay | Admitting: *Deleted

## 2018-07-27 ENCOUNTER — Telehealth (INDEPENDENT_AMBULATORY_CARE_PROVIDER_SITE_OTHER): Payer: Self-pay | Admitting: *Deleted

## 2018-07-27 NOTE — Telephone Encounter (Signed)
Patent was seen in office 07/16/18 while I was out on vacation - I have tried calling her twice to schedule TCS, no answer and no voice mail - I have mailed her a letter to call me to schedule

## 2018-08-03 ENCOUNTER — Encounter (INDEPENDENT_AMBULATORY_CARE_PROVIDER_SITE_OTHER): Payer: Self-pay | Admitting: *Deleted

## 2018-08-03 ENCOUNTER — Telehealth (INDEPENDENT_AMBULATORY_CARE_PROVIDER_SITE_OTHER): Payer: Self-pay | Admitting: *Deleted

## 2018-08-03 ENCOUNTER — Other Ambulatory Visit (INDEPENDENT_AMBULATORY_CARE_PROVIDER_SITE_OTHER): Payer: Self-pay | Admitting: Internal Medicine

## 2018-08-03 DIAGNOSIS — R195 Other fecal abnormalities: Secondary | ICD-10-CM | POA: Insufficient documentation

## 2018-08-03 NOTE — Telephone Encounter (Signed)
Patient needs trilyte 

## 2018-08-04 MED ORDER — PEG 3350-KCL-NA BICARB-NACL 420 G PO SOLR: 4000.0000 mL | Freq: Once | ORAL | 0 refills | Status: AC

## 2018-09-03 ENCOUNTER — Other Ambulatory Visit (HOSPITAL_COMMUNITY)
Admission: RE | Admit: 2018-09-03 | Discharge: 2018-09-03 | Disposition: A | Discharge status: Home / Self Care | Payer: Medicare Other | Source: Ambulatory Visit | Attending: Internal Medicine | Admitting: Internal Medicine

## 2018-09-03 ENCOUNTER — Other Ambulatory Visit: Payer: Self-pay

## 2018-09-03 DIAGNOSIS — Z20828 Contact with and (suspected) exposure to other viral communicable diseases: Secondary | ICD-10-CM | POA: Diagnosis not present

## 2018-09-03 DIAGNOSIS — Z01812 Encounter for preprocedural laboratory examination: Secondary | ICD-10-CM | POA: Diagnosis present

## 2018-09-03 LAB — SARS CORONAVIRUS 2 (TAT 6-24 HRS): SARS Coronavirus 2: NEGATIVE

## 2018-09-06 ENCOUNTER — Encounter (HOSPITAL_COMMUNITY)
Admission: RE | Disposition: A | Discharge status: Home / Self Care | Payer: Self-pay | Source: Home / Self Care | Attending: Internal Medicine

## 2018-09-06 ENCOUNTER — Ambulatory Visit (HOSPITAL_COMMUNITY)
Admission: RE | Admit: 2018-09-06 | Discharge: 2018-09-06 | Disposition: A | Discharge status: Home / Self Care | Payer: Medicare Other | Attending: Internal Medicine | Admitting: Internal Medicine

## 2018-09-06 ENCOUNTER — Other Ambulatory Visit: Payer: Self-pay

## 2018-09-06 DIAGNOSIS — D122 Benign neoplasm of ascending colon: Secondary | ICD-10-CM | POA: Insufficient documentation

## 2018-09-06 DIAGNOSIS — Z85528 Personal history of other malignant neoplasm of kidney: Secondary | ICD-10-CM | POA: Diagnosis not present

## 2018-09-06 DIAGNOSIS — D125 Benign neoplasm of sigmoid colon: Secondary | ICD-10-CM

## 2018-09-06 DIAGNOSIS — K644 Residual hemorrhoidal skin tags: Secondary | ICD-10-CM | POA: Diagnosis not present

## 2018-09-06 DIAGNOSIS — R194 Change in bowel habit: Secondary | ICD-10-CM | POA: Insufficient documentation

## 2018-09-06 DIAGNOSIS — I10 Essential (primary) hypertension: Secondary | ICD-10-CM | POA: Diagnosis not present

## 2018-09-06 DIAGNOSIS — Z794 Long term (current) use of insulin: Secondary | ICD-10-CM | POA: Diagnosis not present

## 2018-09-06 DIAGNOSIS — K5730 Diverticulosis of large intestine without perforation or abscess without bleeding: Secondary | ICD-10-CM

## 2018-09-06 DIAGNOSIS — R195 Other fecal abnormalities: Secondary | ICD-10-CM | POA: Insufficient documentation

## 2018-09-06 DIAGNOSIS — K5790 Diverticulosis of intestine, part unspecified, without perforation or abscess without bleeding: Secondary | ICD-10-CM | POA: Insufficient documentation

## 2018-09-06 DIAGNOSIS — D123 Benign neoplasm of transverse colon: Secondary | ICD-10-CM | POA: Insufficient documentation

## 2018-09-06 DIAGNOSIS — Z7982 Long term (current) use of aspirin: Secondary | ICD-10-CM | POA: Diagnosis not present

## 2018-09-06 DIAGNOSIS — Z79899 Other long term (current) drug therapy: Secondary | ICD-10-CM | POA: Insufficient documentation

## 2018-09-06 DIAGNOSIS — E119 Type 2 diabetes mellitus without complications: Secondary | ICD-10-CM | POA: Insufficient documentation

## 2018-09-06 HISTORY — PX: BIOPSY: SHX5522

## 2018-09-06 HISTORY — PX: POLYPECTOMY: SHX5525

## 2018-09-06 HISTORY — PX: COLONOSCOPY: SHX5424

## 2018-09-06 LAB — GLUCOSE, CAPILLARY: Glucose-Capillary: 101 mg/dL — ABNORMAL HIGH (ref 70–99)

## 2018-09-06 SURGERY — COLONOSCOPY
Anesthesia: Moderate Sedation

## 2018-09-06 MED ORDER — MIDAZOLAM HCL 5 MG/5ML IJ SOLN
INTRAMUSCULAR | Status: AC
  Filled 2018-09-06: qty 10

## 2018-09-06 MED ORDER — SODIUM CHLORIDE 0.9 % IV SOLN
INTRAVENOUS | Status: DC
  Administered 2018-09-06: 07:00:00 via INTRAVENOUS

## 2018-09-06 MED ORDER — MEPERIDINE HCL 50 MG/ML IJ SOLN
INTRAMUSCULAR | Status: DC | PRN
  Administered 2018-09-06 (×2): 25 mg

## 2018-09-06 MED ORDER — MEPERIDINE HCL 50 MG/ML IJ SOLN
INTRAMUSCULAR | Status: AC
  Filled 2018-09-06: qty 1

## 2018-09-06 MED ORDER — MIDAZOLAM HCL 5 MG/5ML IJ SOLN
INTRAMUSCULAR | Status: DC | PRN
  Administered 2018-09-06: 1 mg via INTRAVENOUS
  Administered 2018-09-06: 2 mg via INTRAVENOUS
  Administered 2018-09-06: 1 mg via INTRAVENOUS

## 2018-09-06 NOTE — Discharge Instructions (Signed)
Resume aspirin on 09/07/2018. Resume other medications as before. Modified carb high-fiber diet Metamucil or equivalent 3 to 4 g by mouth daily at bedtime No driving for 24 hours. Physician will call with biopsy results.   Colonoscopy, Adult, Care After This sheet gives you information about how to care for yourself after your procedure. Your doctor may also give you more specific instructions. If you have problems or questions, call your doctor. What can I expect after the procedure? After the procedure, it is common to have:  A small amount of blood in your poop for 24 hours.  Some gas.  Mild cramping or bloating in your belly. Follow these instructions at home: General instructions  For the first 24 hours after the procedure: ? Do not drive or use machinery. ? Do not sign important documents. ? Do not drink alcohol. ? Do your daily activities more slowly than normal. ? Eat foods that are soft and easy to digest.  Take over-the-counter or prescription medicines only as told by your doctor. To help cramping and bloating:   Try walking around.  Put heat on your belly (abdomen) as told by your doctor. Use a heat source that your doctor recommends, such as a moist heat pack or a heating pad. ? Put a towel between your skin and the heat source. ? Leave the heat on for 20-30 minutes. ? Remove the heat if your skin turns bright red. This is especially important if you cannot feel pain, heat, or cold. You can get burned. Eating and drinking   Drink enough fluid to keep your pee (urine) clear or pale yellow.  Return to your normal diet as told by your doctor. Avoid heavy or fried foods that are hard to digest.  Avoid drinking alcohol for as long as told by your doctor. Contact a doctor if:  You have blood in your poop (stool) 2-3 days after the procedure. Get help right away if:  You have more than a small amount of blood in your poop.  You see large clumps of tissue  (blood clots) in your poop.  Your belly is swollen.  You feel sick to your stomach (nauseous).  You throw up (vomit).  You have a fever.  You have belly pain that gets worse, and medicine does not help your pain. Summary  After the procedure, it is common to have a small amount of blood in your poop. You may also have mild cramping and bloating in your belly.  For the first 24 hours after the procedure, do not drive or use machinery, do not sign important documents, and do not drink alcohol.  Get help right away if you have a lot of blood in your poop, feel sick to your stomach, have a fever, or have more belly pain. This information is not intended to replace advice given to you by your health care provider. Make sure you discuss any questions you have with your health care provider. Document Released: 02/15/2010 Document Revised: 11/13/2016 Document Reviewed: 10/08/2015 Elsevier Patient Education  2020 [employer].    Diverticulosis  Diverticulosis is a condition that develops when small pouches (diverticula) form in the wall of the large intestine (colon). The colon is where water is absorbed and stool is formed. The pouches form when the inside layer of the colon pushes through weak spots in the outer layers of the colon. You may have a few pouches or many of them. What are the causes? The cause of this condition  is not known. What increases the risk? The following factors may make you more likely to develop this condition:  Being older than age 36. Your risk for this condition increases with age. Diverticulosis is rare among people younger than age 37. By age 10, many people have it.  Eating a low-fiber diet.  Having frequent constipation.  Being overweight.  Not getting enough exercise.  Smoking.  Taking over-the-counter pain medicines, like aspirin and ibuprofen.  Having a family history of diverticulosis. What are the signs or symptoms? In most people, there  are no symptoms of this condition. If you do have symptoms, they may include:  Bloating.  Cramps in the abdomen.  Constipation or diarrhea.  Pain in the lower left side of the abdomen. How is this diagnosed? This condition is most often diagnosed during an exam for other colon problems. Because diverticulosis usually has no symptoms, it often cannot be diagnosed independently. This condition may be diagnosed by:  Using a flexible scope to examine the colon (colonoscopy).  Taking an X-ray of the colon after dye has been put into the colon (barium enema).  Doing a CT scan. How is this treated? You may not need treatment for this condition if you have never developed an infection related to diverticulosis. If you have had an infection before, treatment may include:  Eating a high-fiber diet. This may include eating more fruits, vegetables, and grains.  Taking a fiber supplement.  Taking a live bacteria supplement (probiotic).  Taking medicine to relax your colon.  Taking antibiotic medicines. Follow these instructions at home:  Drink 6-8 glasses of water or more each day to prevent constipation.  Try not to strain when you have a bowel movement.  If you have had an infection before: ? Eat more fiber as directed by your health care provider or your diet and nutrition specialist (dietitian). ? Take a fiber supplement or probiotic, if your health care provider approves.  Take over-the-counter and prescription medicines only as told by your health care provider.  If you were prescribed an antibiotic, take it as told by your health care provider. Do not stop taking the antibiotic even if you start to feel better.  Keep all follow-up visits as told by your health care provider. This is important. Contact a health care provider if:  You have pain in your abdomen.  You have bloating.  You have cramps.  You have not had a bowel movement in 3 days. Get help right away  if:  Your pain gets worse.  Your bloating becomes very bad.  You have a fever or chills, and your symptoms suddenly get worse.  You vomit.  You have bowel movements that are bloody or black.  You have bleeding from your rectum. Summary  Diverticulosis is a condition that develops when small pouches (diverticula) form in the wall of the large intestine (colon).  You may have a few pouches or many of them.  This condition is most often diagnosed during an exam for other colon problems.  If you have had an infection related to diverticulosis, treatment may include increasing the fiber in your diet, taking supplements, or taking medicines. This information is not intended to replace advice given to you by your health care provider. Make sure you discuss any questions you have with your health care provider. Document Released: 10/11/2003 Document Revised: 12/26/2016 Document Reviewed: 12/03/2015 Elsevier Patient Education  [place].   Colon Polyps  Polyps are tissue growths inside the  body. Polyps can grow in many places, including the large intestine (colon). A polyp may be a round bump or a mushroom-shaped growth. You could have one polyp or several. Most colon polyps are noncancerous (benign). However, some colon polyps can become cancerous over time. Finding and removing the polyps early can help prevent this. What are the causes? The exact cause of colon polyps is not known. What increases the risk? You are more likely to develop this condition if you:  Have a family history of colon cancer or colon polyps.  Are older than 80 or older than 45 if you are African American.  Have inflammatory bowel disease, such as ulcerative colitis or Crohn's disease.  Have certain hereditary conditions, such as: ? Familial adenomatous polyposis. ? Lynch syndrome. ? Turcot syndrome. ? Peutz-Jeghers syndrome.  Are overweight.  Smoke cigarettes.  Do not get enough  exercise.  Drink too much alcohol.  Eat a diet that is high in fat and red meat and low in fiber.  Had childhood cancer that was treated with abdominal radiation. What are the signs or symptoms? Most polyps do not cause symptoms. If you have symptoms, they may include:  Blood coming from your rectum when having a bowel movement.  Blood in your stool. The stool may look dark red or black.  Abdominal pain.  A change in bowel habits, such as constipation or diarrhea. How is this diagnosed? This condition is diagnosed with a colonoscopy. This is a procedure in which a lighted, flexible scope is inserted into the anus and then passed into the colon to examine the area. Polyps are sometimes found when a colonoscopy is done as part of routine cancer screening tests. How is this treated? Treatment for this condition involves removing any polyps that are found. Most polyps can be removed during a colonoscopy. Those polyps will then be tested for cancer. Additional treatment may be needed depending on the results of testing. Follow these instructions at home: Lifestyle  Maintain a healthy weight, or lose weight if recommended by your health care provider.  Exercise every day or as told by your health care provider.  Do not use any products that contain nicotine or tobacco, such as cigarettes and e-cigarettes. If you need help quitting, ask your health care provider.  If you drink alcohol, limit how much you have: ? 0-1 drink a day for women. ? 0-2 drinks a day for men.  Be aware of how much alcohol is in your drink. In the [place] one drink equals one 12 oz bottle of beer (355 mL), one 5 oz glass of wine (148 mL), or one 1 oz shot of hard liquor (44 mL). Eating and drinking   Eat foods that are high in fiber, such as fruits, vegetables, and whole grains.  Eat foods that are high in calcium and vitamin D, such as milk, cheese, yogurt, eggs, liver, fish, and broccoli.  Limit foods that  are high in fat, such as fried foods and desserts.  Limit the amount of red meat and processed meat you eat, such as hot dogs, sausage, bacon, and lunch meats. General instructions  Keep all follow-up visits as told by your health care provider. This is important. ? This includes having regularly scheduled colonoscopies. ? Talk to your health care provider about when you need a colonoscopy. Contact a health care provider if:  You have new or worsening bleeding during a bowel movement.  You have new or increased blood in your stool.  You have a change in bowel habits.  You lose weight for no known reason. Summary  Polyps are tissue growths inside the body. Polyps can grow in many places, including the colon.  Most colon polyps are noncancerous (benign), but some can become cancerous over time.  This condition is diagnosed with a colonoscopy.  Treatment for this condition involves removing any polyps that are found. Most polyps can be removed during a colonoscopy. This information is not intended to replace advice given to you by your health care provider. Make sure you discuss any questions you have with your health care provider. Document Released: 10/10/2003 Document Revised: 04/30/2017 Document Reviewed: 04/30/2017 Elsevier Patient Education  [place] POST-ANESTHESIA  IMMEDIATELY FOLLOWING SURGERY:  Do not drive or operate machinery for the first twenty four hours after surgery.  Do not make any important decisions for twenty four hours after surgery or while taking narcotic pain medications or sedatives.  If you develop intractable nausea and vomiting or a severe headache please notify your doctor immediately.  FOLLOW-UP:  Please make an appointment with your surgeon as instructed. You do not need to follow up with anesthesia unless specifically instructed to do so.  WOUND CARE INSTRUCTIONS (if applicable):  Keep a dry clean dressing on the  anesthesia/puncture wound site if there is drainage.  Once the wound has quit draining you may leave it open to air.  Generally you should leave the bandage intact for twenty four hours unless there is drainage.  If the epidural site drains for more than 36-48 hours please call the anesthesia department.  QUESTIONS?:  Please feel free to call your physician or the hospital operator if you have any questions, and they will be happy to assist you.

## 2018-09-06 NOTE — H&P (Signed)
Briana Garrison is an 68 y.o. female.   Chief Complaint: Patient is here for colonoscopy. HPI: Patient is 68 year old African-American female who presents with change in bowel habits.  She states her bowels are always regular.  Since her last colonoscopy 3 years ago her bowels will not move unless she takes a laxative.  She denies abdominal pain or frank rectal bleeding.  She has hemorrhoids and at times these flareup and she may notice blood in tissue but she did yesterday after taking the preparation. Last aspirin dose was 2 days ago. Family history is negative for CRC.  Past Medical History:  Diagnosis Date  . Cancer of kidney ([place]) 05/14/2009  . Diabetes mellitus   . Endometrial mass   . GERD (gastroesophageal reflux disease)   . Hypercholesterolemia   . Hypertension   . Internal hemorrhoids    S/P colonoscopy 06/10/00 by Dr. Lajoyce Corners  . Shingles     Past Surgical History:  Procedure Laterality Date  . CHOLECYSTECTOMY  11/13/08  . CYSTOSCOPY KIDNEY W/ URETERAL GUIDE WIRE  05/14/09  . Diagnostic hysteroscopy, D&C, endometrial polypectomy  07/01/2005  . LIPECTOMY      Family History  Problem Relation Age of Onset  . Ovarian cancer Mother   . Stroke Mother   . Stroke Father   . Multiple sclerosis Sister   . Stroke Brother    Social History:  reports that she has never smoked. She has never used smokeless tobacco. She reports that she does not drink alcohol or use drugs.  Allergies:  Allergies  Allergen Reactions  . Amlodipine Swelling    Medications Prior to Admission  Medication Sig Dispense Refill  . albuterol (PROVENTIL HFA;VENTOLIN HFA) 108 (90 Base) MCG/ACT inhaler Inhale 2 puffs into the lungs every 4 (four) hours as needed for wheezing or shortness of breath (or coughing). 1 Inhaler 0  . aspirin EC 81 MG tablet Take 81 mg by mouth daily.    Marland Kitchen CARTIA XT 240 MG 24 hr capsule Take 240 mg by mouth daily.  12  . cholecalciferol (VITAMIN D) 1000 UNITS tablet Take 1,000 Units  by mouth 2 (two) times daily.     . cloNIDine (CATAPRES) 0.1 MG tablet Take 0.1-0.2 mg by mouth 2 (two) times daily. 0.1 MG in AM and 0.2 MG in  PM    . furosemide (LASIX) 20 MG tablet Take 20 mg by mouth daily.     . hydrochlorothiazide (MICROZIDE) 12.5 MG capsule Take 12.5 mg by mouth daily.    Marland Kitchen HYDROcodone-acetaminophen (NORCO) 5-325 MG tablet Take 1 tablet by mouth every 4 (four) hours as needed for moderate pain (or coughing). 10 tablet 0  . insulin glargine (LANTUS) 100 UNIT/ML injection Inject 40 Units into the skin at bedtime. (Patient taking differently: Inject 60 Units into the skin at bedtime. 20 in am and 40 in pm) 10 mL 0  . insulin lispro (HUMALOG) 100 UNIT/ML KiwkPen Inject 14-18 Units into the skin 3 (three) times daily. 14 units with breakfast and lunch, 18 units with supper    . linaclotide (LINZESS) 145 MCG CAPS capsule Take 145 mcg by mouth daily before breakfast.    . predniSONE (DELTASONE) 20 MG tablet Take 3 tablets (60 mg total) by mouth daily. 15 tablet 0  . telmisartan (MICARDIS) 40 MG tablet Take 40 mg by mouth daily.    . vitamin B-12 (CYANOCOBALAMIN) 500 MCG tablet Take 500 mcg by mouth daily.      Results for orders placed  or performed during the hospital encounter of 09/06/18 (from the past 48 hour(s))  Glucose, capillary     Status: Abnormal   Collection Time: 09/06/18  7:03 AM  Result Value Ref Range   Glucose-Capillary 101 (H) 70 - 99 mg/dL   No results found.  ROS  Blood pressure (!) 174/74, pulse 86, temperature 97.6 F (36.4 C), temperature source Oral, resp. rate 20, height 5\' 1"  (1.549 m), weight 88 kg, SpO2 100 %. Physical Exam  Constitutional: She appears well-developed and well-nourished.  HENT:  Mouth/Throat: Oropharynx is clear and moist.  Eyes: Conjunctivae are normal. No scleral icterus.  Neck: No thyromegaly present.  Cardiovascular: Normal rate, regular rhythm and normal heart sounds.  No murmur heard. Respiratory: Effort normal and  breath sounds normal.  GI:  Abdomen is full with laparoscopy scars in the right upper quadrant right midabdomen.  Abdomen is soft and nontender with organomegaly or masses.  Musculoskeletal:        General: No edema.  Lymphadenopathy:    She has no cervical adenopathy.  Neurological: She is alert.  Skin: Skin is warm and dry.     Assessment/Plan Change in bowel habits. Diagnostic colonoscopy.  Hildred Laser, MD 09/06/2018, 7:28 AM

## 2018-09-06 NOTE — Op Note (Signed)
[facility] Patient Name: Briana Garrison Procedure Date: 09/06/2018 7:05 AM MRN: [id number] Date of Birth: 1951-01-13 Attending MD: Hildred Laser , MD CSN: [id number] Age: 68 Admit Type: Outpatient Procedure:                Colonoscopy Indications:              Change in bowel habits Providers:                Hildred Laser, MD, Otis [facility] B. Sharon Seller, RN, Janeece Riggers, RN, Aram Candela Referring MD:             [name], MD Medicines:                Meperidine 50 mg IV, Midazolam 4 mg IV Complications:            No immediate complications. Estimated Blood Loss:     Estimated blood loss was minimal. Procedure:                Pre-Anesthesia Assessment:                           - Prior to the procedure, a History and Physical                            was performed, and patient medications and                            allergies were reviewed. The patient's tolerance of                            previous anesthesia was also reviewed. The risks                            and benefits of the procedure and the sedation                            options and risks were discussed with the patient.                            All questions were answered, and informed consent                            was obtained. Prior Anticoagulants: The patient has                            taken no previous anticoagulant or antiplatelet                            agents except for aspirin. ASA Grade Assessment:                            III - A patient with severe systemic disease. After  reviewing the risks and benefits, the patient was                            deemed in satisfactory condition to undergo the                            procedure.                           After obtaining informed consent, the colonoscope                            was passed under direct vision. Throughout the                            procedure, the  patient's blood pressure, pulse, and                            oxygen saturations were monitored continuously. The                            [id number]-H190DL ([id number]) scope was introduced through                            the anus and advanced to the the cecum, identified                            by appendiceal orifice and ileocecal valve. The                            colonoscopy was performed without difficulty. The                            patient tolerated the procedure well. The quality                            of the bowel preparation was adequate. The                            ileocecal valve, appendiceal orifice, and rectum                            were photographed. Scope In: 7:37:42 AM Scope Out: 7:59:45 AM Scope Withdrawal Time: 0 hours 13 minutes 18 seconds  Total Procedure Duration: 0 hours 22 minutes 3 seconds  Findings:      Skin tags were found on perianal exam.      Three polyps were found in the transverse colon and ascending colon. The       polyps were 3 to 7 mm in size. These polyps were removed with a cold       snare. Resection and retrieval were complete. The pathology specimen was       placed into Bottle Number 1.      A small polyp was found in the sigmoid colon. The polyp was sessile.  Biopsies were taken with a cold forceps for histology.      Scattered diverticula were found in the sigmoid colon.      External hemorrhoids were found during retroflexion. The hemorrhoids       were small. Impression:               - Perianal skin tags found on perianal exam.                           - Three 3 to 7 mm polyps in the transverse colon                            and in the ascending colon, removed with a cold                            snare. Resected and retrieved.                           - One small polyp in the sigmoid colon. Biopsied.                           - Diverticulosis in the sigmoid colon.                           - External  hemorrhoids. Moderate Sedation:      Moderate (conscious) sedation was administered by the endoscopy nurse       and supervised by the endoscopist. The following parameters were       monitored: oxygen saturation, heart rate, blood pressure, CO2       capnography and response to care. Total physician intraservice time was       27 minutes. Recommendation:           - Patient has a contact number available for                            emergencies. The signs and symptoms of potential                            delayed complications were discussed with the                            patient. Return to normal activities tomorrow.                            Written discharge instructions were provided to the                            patient.                           - High fiber diet and diabetic (ADA) diet today.                           - Continue present medications.                           -  Metamucil 4 g po qhs.                           - No aspirin, ibuprofen, naproxen, or other                            non-steroidal anti-inflammatory drugs for 1 day.                           - Await pathology results.                           - Repeat colonoscopy is recommended. The                            colonoscopy date will be determined after pathology                            results from today's exam become available for                            review. Procedure Code(s):        --- Professional ---                           [phone], Colonoscopy, flexible; with removal of                            tumor(s), polyp(s), or other lesion(s) by snare                            technique                           45380, 59, Colonoscopy, flexible; with biopsy,                            single or multiple                           99153, Moderate sedation; each additional 15                            minutes intraservice time                           G0500, Moderate sedation  services provided by the                            same physician or other qualified health care                            professional performing a gastrointestinal                            endoscopic service that sedation supports,  requiring the presence of an independent trained                            observer to assist in the monitoring of the                            patient's level of consciousness and physiological                            status; initial 15 minutes of intra-service time;                            patient age 81 years or older (additional time may                            be reported with [phone], as appropriate) Diagnosis Code(s):        --- Professional ---                           K63.5, Polyp of colon                           K64.4, Residual hemorrhoidal skin tags                           R19.4, Change in bowel habit                           K57.30, Diverticulosis of large intestine without                            perforation or abscess without bleeding CPT copyright 2019 American Medical Association. All rights reserved. The codes documented in this report are preliminary and upon coder review may  be revised to meet current compliance requirements. Hildred Laser, MD Hildred Laser, MD 09/06/2018 8:10:11 AM This report has been signed electronically. Number of Addenda: 0

## 2018-09-15 ENCOUNTER — Encounter (HOSPITAL_COMMUNITY): Payer: Self-pay | Admitting: Internal Medicine

## 2018-09-16 ENCOUNTER — Other Ambulatory Visit: Payer: Self-pay | Admitting: Internal Medicine

## 2018-09-16 DIAGNOSIS — R109 Unspecified abdominal pain: Secondary | ICD-10-CM

## 2018-10-11 ENCOUNTER — Other Ambulatory Visit: Payer: Self-pay | Admitting: Nephrology

## 2018-10-11 ENCOUNTER — Other Ambulatory Visit (HOSPITAL_COMMUNITY): Payer: Self-pay | Admitting: Internal Medicine

## 2018-10-11 DIAGNOSIS — R109 Unspecified abdominal pain: Secondary | ICD-10-CM

## 2018-10-15 ENCOUNTER — Other Ambulatory Visit: Payer: Self-pay | Admitting: Nephrology

## 2018-10-15 DIAGNOSIS — N1832 Chronic kidney disease, stage 3b: Secondary | ICD-10-CM

## 2018-10-18 ENCOUNTER — Other Ambulatory Visit: Payer: Self-pay | Admitting: Nephrology

## 2018-10-18 ENCOUNTER — Other Ambulatory Visit (HOSPITAL_COMMUNITY): Payer: Self-pay | Admitting: Nephrology

## 2018-10-18 DIAGNOSIS — N1832 Chronic kidney disease, stage 3b: Secondary | ICD-10-CM

## 2018-10-21 ENCOUNTER — Other Ambulatory Visit (HOSPITAL_COMMUNITY): Payer: Self-pay | Admitting: Nephrology

## 2018-10-21 ENCOUNTER — Ambulatory Visit (HOSPITAL_COMMUNITY)
Admission: RE | Admit: 2018-10-21 | Discharge: 2018-10-21 | Disposition: A | Discharge status: Home / Self Care | Payer: Medicare Other | Source: Ambulatory Visit | Attending: Internal Medicine | Admitting: Internal Medicine

## 2018-10-21 ENCOUNTER — Other Ambulatory Visit: Payer: Self-pay

## 2018-10-21 ENCOUNTER — Ambulatory Visit (HOSPITAL_COMMUNITY): Admission: RE | Admit: 2018-10-21 | Payer: Medicare Other | Source: Ambulatory Visit

## 2018-10-21 DIAGNOSIS — R609 Edema, unspecified: Secondary | ICD-10-CM

## 2018-10-21 DIAGNOSIS — R911 Solitary pulmonary nodule: Secondary | ICD-10-CM | POA: Insufficient documentation

## 2018-10-21 DIAGNOSIS — R109 Unspecified abdominal pain: Secondary | ICD-10-CM | POA: Insufficient documentation

## 2018-10-21 DIAGNOSIS — I358 Other nonrheumatic aortic valve disorders: Secondary | ICD-10-CM | POA: Insufficient documentation

## 2018-10-21 DIAGNOSIS — N200 Calculus of kidney: Secondary | ICD-10-CM | POA: Insufficient documentation

## 2018-10-21 DIAGNOSIS — I700 Atherosclerosis of aorta: Secondary | ICD-10-CM | POA: Insufficient documentation

## 2018-10-21 NOTE — Progress Notes (Signed)
*  PRELIMINARY RESULTS* Echocardiogram 2D Echocardiogram has been performed.  Briana Garrison 10/21/2018, 12:07 PM

## 2018-10-22 ENCOUNTER — Ambulatory Visit (HOSPITAL_COMMUNITY): Payer: Medicare Other

## 2018-10-22 ENCOUNTER — Other Ambulatory Visit (HOSPITAL_COMMUNITY): Payer: Medicare Other

## 2018-10-25 ENCOUNTER — Other Ambulatory Visit: Payer: Self-pay

## 2018-10-25 ENCOUNTER — Ambulatory Visit (HOSPITAL_COMMUNITY)
Admission: RE | Admit: 2018-10-25 | Discharge: 2018-10-25 | Disposition: A | Discharge status: Home / Self Care | Payer: Medicare Other | Source: Ambulatory Visit | Attending: Nephrology | Admitting: Nephrology

## 2018-10-25 DIAGNOSIS — N1832 Chronic kidney disease, stage 3b: Secondary | ICD-10-CM

## 2018-10-25 DIAGNOSIS — N183 Chronic kidney disease, stage 3 (moderate): Secondary | ICD-10-CM | POA: Diagnosis present

## 2018-11-25 NOTE — Progress Notes (Signed)
Patient referred by [name], MD for left ventricular hypertrophy  Subjective:   Briana Garrison, female    DOB: [birth year], 68 y.o.   MRN: [id number]   Chief Complaint  Patient presents with  . left ventricular hypertrophy  . New Patient (Initial Visit)    HPI  68 year old female with hypertension, hyperlipidemia, type 2 diabetes mellitus, renal cell carcinoma status post partial right nephrectomy, CKD stage 3b, referred for evaluation of severe left ventricular hypertrophy.  Patient has a long standing history of hypertension, now well controlled on multiple medications. She has close follow up for her advanced CKD with Dr. Hollie Salk with regular adjustment of her lasix dose. Patient complains of overall lack of energy ad fatigue, but denies exertional dyspnea or chest pain. She also denies orthopnea, PND.    Past Medical History:  Diagnosis Date  . Cancer of kidney ([place]) 05/14/2009  . Diabetes mellitus   . Endometrial mass   . GERD (gastroesophageal reflux disease)   . Hypercholesterolemia   . Hypertension   . Internal hemorrhoids    S/P colonoscopy 06/10/00 by Dr. Lajoyce Corners  . Shingles      Past Surgical History:  Procedure Laterality Date  . BIOPSY  09/06/2018   Procedure: BIOPSY;  Surgeon: Rogene Houston, MD;  [facility];  Service: Endoscopy;;  . CHOLECYSTECTOMY  11/13/08  . COLONOSCOPY N/A 09/06/2018   Procedure: COLONOSCOPY;  Surgeon: Rogene Houston, MD;  Location: [facility];  Service: Endoscopy;  Laterality: N/A;  730  . CYSTOSCOPY KIDNEY W/ URETERAL GUIDE WIRE  05/14/09  . Diagnostic hysteroscopy, D&C, endometrial polypectomy  07/01/2005  . LIPECTOMY    . POLYPECTOMY  09/06/2018   Procedure: POLYPECTOMY;  Surgeon: Rogene Houston, MD;  Location: [facility];  Service: Endoscopy;;     Social History   Socioeconomic History  . Marital status: Married    Spouse name: [name]  . Number of children: 2  . Years of education: 30  . Highest  education level: Not on file  Occupational History  . 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 Financial resource strain: Not on file  . Food insecurity    Worry: Not on file    Inability: Not on file  . Transportation needs    Medical: Not on file    Non-medical: Not on file  Tobacco Use  . Smoking status: Never Smoker  . Smokeless tobacco: Never Used  Substance and Sexual Activity  . Alcohol use: No  . Drug use: No  . Sexual activity: Not on file  Lifestyle  . Physical activity    Days per week: Not on file    Minutes per session: Not on file  . Stress: Not on file  Relationships  . Social Herbalist on phone: Not on file    Gets together: Not on file    Attends religious service: Not on file    Active member of club or organization: Not on file    Attends meetings of clubs or organizations: Not on file    Relationship status: Not on file  . Intimate partner violence    Fear of current or ex partner: Not on file    Emotionally abused: Not on file    Physically abused: Not on file    Forced sexual activity: Not on file  Other Topics Concern  . Not on file  Social History Narrative   Married.  Lives with husband.  Normally ambulates without assistance.     Family History  Problem Relation Age of Onset  . Ovarian cancer Mother   . Stroke Mother   . Stroke Father   . Multiple sclerosis Sister   . Stroke Brother      Current Outpatient Medications on File Prior to Visit  Medication Sig Dispense Refill  . albuterol (PROVENTIL HFA;VENTOLIN HFA) 108 (90 Base) MCG/ACT inhaler Inhale 2 puffs into the lungs every 4 (four) hours as needed for wheezing or shortness of breath (or coughing). 1 Inhaler 0  . aspirin EC 81 MG tablet Take 81 mg by mouth daily.    Marland Kitchen CARTIA XT 240 MG 24 hr capsule Take 240 mg by mouth daily.  12  . cholecalciferol (VITAMIN D) 1000 UNITS tablet Take 1,000 Units by mouth 2  (two) times daily.     . cloNIDine (CATAPRES) 0.1 MG tablet Take 0.1-0.2 mg by mouth 2 (two) times daily. 0.1 MG in AM and 0.2 MG in  PM    . furosemide (LASIX) 20 MG tablet Take 20 mg by mouth daily.     . hydrochlorothiazide (MICROZIDE) 12.5 MG capsule Take 12.5 mg by mouth daily.    Marland Kitchen HYDROcodone-acetaminophen (NORCO) 5-325 MG tablet Take 1 tablet by mouth every 4 (four) hours as needed for moderate pain (or coughing). 10 tablet 0  . insulin glargine (LANTUS) 100 UNIT/ML injection Inject 40 Units into the skin at bedtime. (Patient taking differently: Inject 60 Units into the skin at bedtime. 20 in am and 40 in pm) 10 mL 0  . insulin lispro (HUMALOG) 100 UNIT/ML KiwkPen Inject 14-18 Units into the skin 3 (three) times daily. 14 units with breakfast and lunch, 18 units with supper    . linaclotide (LINZESS) 145 MCG CAPS capsule Take 145 mcg by mouth daily before breakfast.    . predniSONE (DELTASONE) 20 MG tablet Take 3 tablets (60 mg total) by mouth daily. 15 tablet 0  . telmisartan (MICARDIS) 40 MG tablet Take 40 mg by mouth daily.    . vitamin B-12 (CYANOCOBALAMIN) 500 MCG tablet Take 500 mcg by mouth daily.     No current facility-administered medications on file prior to visit.     Cardiovascular studies:  EKG 11/26/2018: Sinus rhythm 72 bpm. Nonspecific T wave abnormality, inferolateral leads.   Outside echocardiogram 10/21/2018: Severe LVH.  EF 60-65%.  Grade 1 diastolic dysfunction. Normal-sized atria. No significant valvular abnormality.  Recent labs: 10/07/2018: Glucose 370. BUN/Cr 53/2.2. eGFR 26. Na/K 135/4.7. Rest ot he CMP normal. H/H 11/34. MCV 87. Platelets 348.       Review of Systems  Constitution: Positive for malaise/fatigue. Negative for decreased appetite, weight gain and weight loss.  HENT: Negative for congestion.   Eyes: Negative for visual disturbance.  Cardiovascular: Positive for leg swelling. Negative for chest pain, dyspnea on exertion, palpitations  and syncope.  Respiratory: Negative for cough.   Endocrine: Negative for cold intolerance.  Hematologic/Lymphatic: Does not bruise/bleed easily.  Skin: Negative for itching and rash.  Musculoskeletal: Negative for myalgias.  Gastrointestinal: Negative for abdominal pain, nausea and vomiting.  Genitourinary: Negative for dysuria.  Neurological: Negative for dizziness and weakness.  Psychiatric/Behavioral: The patient is not nervous/anxious.   All other systems reviewed and are negative.        Vitals:   11/26/18 1006  BP: (!) 136/55  Pulse: 75  SpO2: 97%     Body mass index  is 37.6 kg/m. Filed Weights   11/26/18 1006  Weight: 199 lb (90.3 kg)     Objective:   Physical Exam  Constitutional: She is oriented to person, place, and time. She appears well-developed and well-nourished. No distress.  HENT:  Head: Normocephalic and atraumatic.  Eyes: Pupils are equal, round, and reactive to light. Conjunctivae are normal.  Neck: No JVD present.  Cardiovascular: Normal rate, regular rhythm and intact distal pulses.  Pulmonary/Chest: Effort normal and breath sounds normal. She has no wheezes. She has no rales.  Abdominal: Soft. Bowel sounds are normal. There is no rebound.  Musculoskeletal:        General: Edema (1-2_ RLE, trace LLE) present.  Lymphadenopathy:    She has no cervical adenopathy.  Neurological: She is alert and oriented to person, place, and time. No cranial nerve deficit.  Skin: Skin is warm and dry.  Psychiatric: She has a normal mood and affect.  Nursing note and vitals reviewed.         Assessment & Recommendations:   68 year old female with hypertension, hyperlipidemia, type 2 diabetes mellitus, renal cell carcinoma status post partial right nephrectomy, CKD stage 3b, referred for evaluation of severe left ventricular hypertrophy.  Left ventricular hypertrophy: I personally reviewed her echocardiogram. She has severe LVH, but without any other  findings-such as atrial dilatation, restrictive physiology to suggest infiltrative cardiomyopathy. She has longstanding history of hypertension, which explains the findings of severe LVH. I agree with the ongoing management of her hypertension, with reasonably good control of blood pressure. I do not think she needs any other cardiac testing at this time.   I recommend follow up with Drs Shelia Media and Hollie Salk. Consider lipid lowering therapy based on recent lipid panel, which is not available for me to review.  I will see he ron as needed basis.   Thank you for referring the patient to [place]. Please feel free to contact with any questions.  Nigel Mormon, MD [facility] [place] Pager: [phone] Office: [phone] If no answer Cell [phone]

## 2018-11-26 ENCOUNTER — Encounter: Payer: Self-pay | Admitting: Cardiology

## 2018-11-26 ENCOUNTER — Ambulatory Visit: Payer: Medicare Other | Admitting: Cardiology

## 2018-11-26 ENCOUNTER — Other Ambulatory Visit: Payer: Self-pay

## 2018-11-26 VITALS — BP 136/55 | HR 75 | Ht 61.0 in | Wt 199.0 lb

## 2018-11-26 DIAGNOSIS — I517 Cardiomegaly: Secondary | ICD-10-CM | POA: Insufficient documentation

## 2018-11-26 DIAGNOSIS — I129 Hypertensive chronic kidney disease with stage 1 through stage 4 chronic kidney disease, or unspecified chronic kidney disease: Secondary | ICD-10-CM | POA: Diagnosis not present

## 2018-11-26 DIAGNOSIS — N1832 Chronic kidney disease, stage 3b: Secondary | ICD-10-CM

## 2018-11-26 DIAGNOSIS — I10 Essential (primary) hypertension: Secondary | ICD-10-CM

## 2019-01-31 DIAGNOSIS — H35372 Puckering of macula, left eye: Secondary | ICD-10-CM | POA: Diagnosis not present

## 2019-01-31 DIAGNOSIS — H43813 Vitreous degeneration, bilateral: Secondary | ICD-10-CM | POA: Diagnosis not present

## 2019-01-31 DIAGNOSIS — E113413 Type 2 diabetes mellitus with severe nonproliferative diabetic retinopathy with macular edema, bilateral: Secondary | ICD-10-CM | POA: Diagnosis not present

## 2019-02-03 DIAGNOSIS — N184 Chronic kidney disease, stage 4 (severe): Secondary | ICD-10-CM | POA: Diagnosis not present

## 2019-02-03 DIAGNOSIS — N2581 Secondary hyperparathyroidism of renal origin: Secondary | ICD-10-CM | POA: Diagnosis not present

## 2019-02-03 DIAGNOSIS — I10 Essential (primary) hypertension: Secondary | ICD-10-CM | POA: Diagnosis not present

## 2019-02-03 DIAGNOSIS — E1140 Type 2 diabetes mellitus with diabetic neuropathy, unspecified: Secondary | ICD-10-CM | POA: Diagnosis not present

## 2019-02-08 DIAGNOSIS — H9201 Otalgia, right ear: Secondary | ICD-10-CM | POA: Diagnosis not present

## 2019-02-08 DIAGNOSIS — M25511 Pain in right shoulder: Secondary | ICD-10-CM | POA: Diagnosis not present

## 2019-02-11 DIAGNOSIS — M25511 Pain in right shoulder: Secondary | ICD-10-CM | POA: Diagnosis not present

## 2019-02-11 DIAGNOSIS — R112 Nausea with vomiting, unspecified: Secondary | ICD-10-CM | POA: Diagnosis not present

## 2019-02-14 DIAGNOSIS — M25511 Pain in right shoulder: Secondary | ICD-10-CM | POA: Diagnosis not present

## 2019-02-18 DIAGNOSIS — H903 Sensorineural hearing loss, bilateral: Secondary | ICD-10-CM | POA: Diagnosis not present

## 2019-02-18 DIAGNOSIS — H9313 Tinnitus, bilateral: Secondary | ICD-10-CM | POA: Diagnosis not present

## 2019-02-24 ENCOUNTER — Other Ambulatory Visit: Payer: Self-pay | Admitting: Family Medicine

## 2019-02-24 DIAGNOSIS — M25511 Pain in right shoulder: Secondary | ICD-10-CM

## 2019-03-19 ENCOUNTER — Ambulatory Visit
Admission: RE | Admit: 2019-03-19 | Discharge: 2019-03-19 | Disposition: A | Discharge status: Home / Self Care | Payer: Medicare PPO | Source: Ambulatory Visit | Attending: Family Medicine | Admitting: Family Medicine

## 2019-03-19 DIAGNOSIS — M25511 Pain in right shoulder: Secondary | ICD-10-CM | POA: Diagnosis not present

## 2019-03-21 DIAGNOSIS — I10 Essential (primary) hypertension: Secondary | ICD-10-CM | POA: Diagnosis not present

## 2019-03-21 DIAGNOSIS — E1165 Type 2 diabetes mellitus with hyperglycemia: Secondary | ICD-10-CM | POA: Diagnosis not present

## 2019-03-23 DIAGNOSIS — N2581 Secondary hyperparathyroidism of renal origin: Secondary | ICD-10-CM | POA: Diagnosis not present

## 2019-03-23 DIAGNOSIS — E1140 Type 2 diabetes mellitus with diabetic neuropathy, unspecified: Secondary | ICD-10-CM | POA: Diagnosis not present

## 2019-03-23 DIAGNOSIS — E1165 Type 2 diabetes mellitus with hyperglycemia: Secondary | ICD-10-CM | POA: Diagnosis not present

## 2019-03-23 DIAGNOSIS — N183 Chronic kidney disease, stage 3 unspecified: Secondary | ICD-10-CM | POA: Diagnosis not present

## 2019-03-23 DIAGNOSIS — E7800 Pure hypercholesterolemia, unspecified: Secondary | ICD-10-CM | POA: Diagnosis not present

## 2019-03-23 DIAGNOSIS — I10 Essential (primary) hypertension: Secondary | ICD-10-CM | POA: Diagnosis not present

## 2019-03-23 DIAGNOSIS — E278 Other specified disorders of adrenal gland: Secondary | ICD-10-CM | POA: Diagnosis not present

## 2019-03-24 DIAGNOSIS — I10 Essential (primary) hypertension: Secondary | ICD-10-CM | POA: Diagnosis not present

## 2019-03-24 DIAGNOSIS — K5900 Constipation, unspecified: Secondary | ICD-10-CM | POA: Diagnosis not present

## 2019-03-24 DIAGNOSIS — N183 Chronic kidney disease, stage 3 unspecified: Secondary | ICD-10-CM | POA: Diagnosis not present

## 2019-03-24 DIAGNOSIS — E1140 Type 2 diabetes mellitus with diabetic neuropathy, unspecified: Secondary | ICD-10-CM | POA: Diagnosis not present

## 2019-03-24 DIAGNOSIS — Z0000 Encounter for general adult medical examination without abnormal findings: Secondary | ICD-10-CM | POA: Diagnosis not present

## 2019-03-28 DIAGNOSIS — M75121 Complete rotator cuff tear or rupture of right shoulder, not specified as traumatic: Secondary | ICD-10-CM | POA: Diagnosis not present

## 2019-03-28 DIAGNOSIS — S46011A Strain of muscle(s) and tendon(s) of the rotator cuff of right shoulder, initial encounter: Secondary | ICD-10-CM | POA: Diagnosis not present

## 2019-04-27 DIAGNOSIS — I10 Essential (primary) hypertension: Secondary | ICD-10-CM | POA: Diagnosis not present

## 2019-04-27 DIAGNOSIS — N2581 Secondary hyperparathyroidism of renal origin: Secondary | ICD-10-CM | POA: Diagnosis not present

## 2019-04-27 DIAGNOSIS — E1165 Type 2 diabetes mellitus with hyperglycemia: Secondary | ICD-10-CM | POA: Diagnosis not present

## 2019-04-27 DIAGNOSIS — E278 Other specified disorders of adrenal gland: Secondary | ICD-10-CM | POA: Diagnosis not present

## 2019-05-02 DIAGNOSIS — E1165 Type 2 diabetes mellitus with hyperglycemia: Secondary | ICD-10-CM | POA: Diagnosis not present

## 2019-05-24 DIAGNOSIS — H6983 Other specified disorders of Eustachian tube, bilateral: Secondary | ICD-10-CM | POA: Diagnosis not present

## 2019-06-02 DIAGNOSIS — E1165 Type 2 diabetes mellitus with hyperglycemia: Secondary | ICD-10-CM | POA: Diagnosis not present

## 2019-06-20 ENCOUNTER — Inpatient Hospital Stay (HOSPITAL_COMMUNITY)
Admission: EM | Admit: 2019-06-20 | Discharge: 2019-06-24 | DRG: 065 | Disposition: A | Discharge status: Home Health | Payer: Medicare PPO | Attending: Family Medicine | Admitting: Family Medicine

## 2019-06-20 ENCOUNTER — Encounter (HOSPITAL_COMMUNITY): Payer: Self-pay | Admitting: *Deleted

## 2019-06-20 ENCOUNTER — Other Ambulatory Visit: Payer: Self-pay

## 2019-06-20 DIAGNOSIS — R471 Dysarthria and anarthria: Secondary | ICD-10-CM | POA: Diagnosis present

## 2019-06-20 DIAGNOSIS — N1832 Chronic kidney disease, stage 3b: Secondary | ICD-10-CM | POA: Diagnosis present

## 2019-06-20 DIAGNOSIS — E782 Mixed hyperlipidemia: Secondary | ICD-10-CM | POA: Diagnosis present

## 2019-06-20 DIAGNOSIS — E785 Hyperlipidemia, unspecified: Secondary | ICD-10-CM | POA: Diagnosis present

## 2019-06-20 DIAGNOSIS — G8194 Hemiplegia, unspecified affecting left nondominant side: Secondary | ICD-10-CM | POA: Diagnosis present

## 2019-06-20 DIAGNOSIS — I6601 Occlusion and stenosis of right middle cerebral artery: Secondary | ICD-10-CM | POA: Diagnosis present

## 2019-06-20 DIAGNOSIS — I672 Cerebral atherosclerosis: Secondary | ICD-10-CM | POA: Diagnosis present

## 2019-06-20 DIAGNOSIS — I10 Essential (primary) hypertension: Secondary | ICD-10-CM | POA: Diagnosis not present

## 2019-06-20 DIAGNOSIS — E1169 Type 2 diabetes mellitus with other specified complication: Secondary | ICD-10-CM | POA: Diagnosis present

## 2019-06-20 DIAGNOSIS — Z85528 Personal history of other malignant neoplasm of kidney: Secondary | ICD-10-CM | POA: Diagnosis not present

## 2019-06-20 DIAGNOSIS — Z823 Family history of stroke: Secondary | ICD-10-CM

## 2019-06-20 DIAGNOSIS — R413 Other amnesia: Secondary | ICD-10-CM | POA: Diagnosis present

## 2019-06-20 DIAGNOSIS — R531 Weakness: Secondary | ICD-10-CM

## 2019-06-20 DIAGNOSIS — Z905 Acquired absence of kidney: Secondary | ICD-10-CM

## 2019-06-20 DIAGNOSIS — Z20822 Contact with and (suspected) exposure to covid-19: Secondary | ICD-10-CM | POA: Diagnosis present

## 2019-06-20 DIAGNOSIS — E1122 Type 2 diabetes mellitus with diabetic chronic kidney disease: Secondary | ICD-10-CM | POA: Diagnosis present

## 2019-06-20 DIAGNOSIS — R200 Anesthesia of skin: Secondary | ICD-10-CM | POA: Diagnosis not present

## 2019-06-20 DIAGNOSIS — Z8041 Family history of malignant neoplasm of ovary: Secondary | ICD-10-CM

## 2019-06-20 DIAGNOSIS — R29704 NIHSS score 4: Secondary | ICD-10-CM | POA: Diagnosis not present

## 2019-06-20 DIAGNOSIS — D631 Anemia in chronic kidney disease: Secondary | ICD-10-CM | POA: Diagnosis present

## 2019-06-20 DIAGNOSIS — Z79899 Other long term (current) drug therapy: Secondary | ICD-10-CM

## 2019-06-20 DIAGNOSIS — I6381 Other cerebral infarction due to occlusion or stenosis of small artery: Secondary | ICD-10-CM | POA: Diagnosis present

## 2019-06-20 DIAGNOSIS — R269 Unspecified abnormalities of gait and mobility: Secondary | ICD-10-CM | POA: Diagnosis not present

## 2019-06-20 DIAGNOSIS — N179 Acute kidney failure, unspecified: Secondary | ICD-10-CM | POA: Diagnosis present

## 2019-06-20 DIAGNOSIS — I639 Cerebral infarction, unspecified: Secondary | ICD-10-CM | POA: Diagnosis not present

## 2019-06-20 DIAGNOSIS — Z888 Allergy status to other drugs, medicaments and biological substances status: Secondary | ICD-10-CM | POA: Diagnosis not present

## 2019-06-20 DIAGNOSIS — Z885 Allergy status to narcotic agent status: Secondary | ICD-10-CM | POA: Diagnosis not present

## 2019-06-20 DIAGNOSIS — I129 Hypertensive chronic kidney disease with stage 1 through stage 4 chronic kidney disease, or unspecified chronic kidney disease: Secondary | ICD-10-CM | POA: Diagnosis present

## 2019-06-20 DIAGNOSIS — G4733 Obstructive sleep apnea (adult) (pediatric): Secondary | ICD-10-CM | POA: Diagnosis present

## 2019-06-20 DIAGNOSIS — N189 Chronic kidney disease, unspecified: Secondary | ICD-10-CM | POA: Diagnosis not present

## 2019-06-20 DIAGNOSIS — D649 Anemia, unspecified: Secondary | ICD-10-CM | POA: Diagnosis not present

## 2019-06-20 DIAGNOSIS — R2681 Unsteadiness on feet: Secondary | ICD-10-CM | POA: Diagnosis not present

## 2019-06-20 DIAGNOSIS — K219 Gastro-esophageal reflux disease without esophagitis: Secondary | ICD-10-CM | POA: Diagnosis present

## 2019-06-20 DIAGNOSIS — I6389 Other cerebral infarction: Secondary | ICD-10-CM | POA: Diagnosis not present

## 2019-06-20 DIAGNOSIS — E1140 Type 2 diabetes mellitus with diabetic neuropathy, unspecified: Secondary | ICD-10-CM | POA: Diagnosis present

## 2019-06-20 DIAGNOSIS — Z9049 Acquired absence of other specified parts of digestive tract: Secondary | ICD-10-CM

## 2019-06-20 DIAGNOSIS — I358 Other nonrheumatic aortic valve disorders: Secondary | ICD-10-CM | POA: Diagnosis present

## 2019-06-20 DIAGNOSIS — R29702 NIHSS score 2: Secondary | ICD-10-CM | POA: Diagnosis present

## 2019-06-20 DIAGNOSIS — M75101 Unspecified rotator cuff tear or rupture of right shoulder, not specified as traumatic: Secondary | ICD-10-CM | POA: Diagnosis present

## 2019-06-20 DIAGNOSIS — E1151 Type 2 diabetes mellitus with diabetic peripheral angiopathy without gangrene: Secondary | ICD-10-CM | POA: Diagnosis present

## 2019-06-20 DIAGNOSIS — Z7982 Long term (current) use of aspirin: Secondary | ICD-10-CM

## 2019-06-20 DIAGNOSIS — Z794 Long term (current) use of insulin: Secondary | ICD-10-CM

## 2019-06-20 LAB — I-STAT CHEM 8, ED
BUN: 50 mg/dL — ABNORMAL HIGH (ref 8–23)
Calcium, Ion: 1.27 mmol/L (ref 1.15–1.40)
Chloride: 105 mmol/L (ref 98–111)
Creatinine, Ser: 2.5 mg/dL — ABNORMAL HIGH (ref 0.44–1.00)
Glucose, Bld: 71 mg/dL (ref 70–99)
HCT: 35 % — ABNORMAL LOW (ref 36.0–46.0)
Hemoglobin: 11.9 g/dL — ABNORMAL LOW (ref 12.0–15.0)
Potassium: 4.1 mmol/L (ref 3.5–5.1)
Sodium: 139 mmol/L (ref 135–145)
TCO2: 30 mmol/L (ref 22–32)

## 2019-06-20 LAB — DIFFERENTIAL
Abs Immature Granulocytes: 0.04 10*3/uL (ref 0.00–0.07)
Basophils Absolute: 0.1 10*3/uL (ref 0.0–0.1)
Basophils Relative: 1 %
Eosinophils Absolute: 0.3 10*3/uL (ref 0.0–0.5)
Eosinophils Relative: 4 %
Immature Granulocytes: 1 %
Lymphocytes Relative: 24 %
Lymphs Abs: 2.1 10*3/uL (ref 0.7–4.0)
Monocytes Absolute: 1 10*3/uL (ref 0.1–1.0)
Monocytes Relative: 11 %
Neutro Abs: 5.1 10*3/uL (ref 1.7–7.7)
Neutrophils Relative %: 59 %

## 2019-06-20 LAB — CBC
HCT: 37.5 % (ref 36.0–46.0)
Hemoglobin: 11.9 g/dL — ABNORMAL LOW (ref 12.0–15.0)
MCH: 28.3 pg (ref 26.0–34.0)
MCHC: 31.7 g/dL (ref 30.0–36.0)
MCV: 89.3 fL (ref 80.0–100.0)
Platelets: 319 10*3/uL (ref 150–400)
RBC: 4.2 MIL/uL (ref 3.87–5.11)
RDW: 13.7 % (ref 11.5–15.5)
WBC: 8.6 10*3/uL (ref 4.0–10.5)
nRBC: 0 % (ref 0.0–0.2)

## 2019-06-20 LAB — COMPREHENSIVE METABOLIC PANEL
ALT: 16 U/L (ref 0–44)
AST: 14 U/L — ABNORMAL LOW (ref 15–41)
Albumin: 3.5 g/dL (ref 3.5–5.0)
Alkaline Phosphatase: 83 U/L (ref 38–126)
Anion gap: 13 (ref 5–15)
BUN: 53 mg/dL — ABNORMAL HIGH (ref 8–23)
CO2: 24 mmol/L (ref 22–32)
Calcium: 9.6 mg/dL (ref 8.9–10.3)
Chloride: 101 mmol/L (ref 98–111)
Creatinine, Ser: 2.31 mg/dL — ABNORMAL HIGH (ref 0.44–1.00)
GFR calc Af Amer: 24 mL/min — ABNORMAL LOW (ref >60)
GFR calc non Af Amer: 21 mL/min — ABNORMAL LOW (ref >60)
Glucose, Bld: 76 mg/dL (ref 70–99)
Potassium: 4.3 mmol/L (ref 3.5–5.1)
Sodium: 138 mmol/L (ref 135–145)
Total Bilirubin: 0.5 mg/dL (ref 0.3–1.2)
Total Protein: 7.2 g/dL (ref 6.5–8.1)

## 2019-06-20 LAB — APTT: aPTT: 35 seconds (ref 24–36)

## 2019-06-20 LAB — CBG MONITORING, ED
Glucose-Capillary: 72 mg/dL (ref 70–99)
Glucose-Capillary: 97 mg/dL (ref 70–99)

## 2019-06-20 LAB — PROTIME-INR
INR: 1 (ref 0.8–1.2)
Prothrombin Time: 12.7 seconds (ref 11.4–15.2)

## 2019-06-20 MED ORDER — SODIUM CHLORIDE 0.9% FLUSH
3.0000 mL | Freq: Once | INTRAVENOUS | Status: DC
Start: 2019-06-20 — End: 2019-06-24

## 2019-06-20 NOTE — ED Triage Notes (Signed)
Pt is here for left sided numbness which began on Thursday. Pt states that she went to see her PCP today and they advised her to come here.  Pt also has some slurred speech (sounds like her tongue is thick) No facial droop.  Pt states that she has been having a difficult time ambulating since Thursday due to the left sided numbness and left sided leg weakness.

## 2019-06-21 ENCOUNTER — Emergency Department (HOSPITAL_COMMUNITY): Payer: Medicare PPO

## 2019-06-21 ENCOUNTER — Inpatient Hospital Stay (HOSPITAL_COMMUNITY): Payer: Medicare PPO

## 2019-06-21 ENCOUNTER — Other Ambulatory Visit (HOSPITAL_COMMUNITY): Payer: Medicare PPO

## 2019-06-21 DIAGNOSIS — G8194 Hemiplegia, unspecified affecting left nondominant side: Secondary | ICD-10-CM | POA: Diagnosis present

## 2019-06-21 DIAGNOSIS — I639 Cerebral infarction, unspecified: Secondary | ICD-10-CM

## 2019-06-21 DIAGNOSIS — Z905 Acquired absence of kidney: Secondary | ICD-10-CM | POA: Diagnosis not present

## 2019-06-21 DIAGNOSIS — E785 Hyperlipidemia, unspecified: Secondary | ICD-10-CM | POA: Diagnosis not present

## 2019-06-21 DIAGNOSIS — Z85528 Personal history of other malignant neoplasm of kidney: Secondary | ICD-10-CM

## 2019-06-21 DIAGNOSIS — N1832 Chronic kidney disease, stage 3b: Secondary | ICD-10-CM | POA: Diagnosis present

## 2019-06-21 DIAGNOSIS — R29702 NIHSS score 2: Secondary | ICD-10-CM | POA: Diagnosis present

## 2019-06-21 DIAGNOSIS — N179 Acute kidney failure, unspecified: Secondary | ICD-10-CM | POA: Diagnosis present

## 2019-06-21 DIAGNOSIS — G4733 Obstructive sleep apnea (adult) (pediatric): Secondary | ICD-10-CM | POA: Diagnosis present

## 2019-06-21 DIAGNOSIS — I6381 Other cerebral infarction due to occlusion or stenosis of small artery: Secondary | ICD-10-CM | POA: Diagnosis present

## 2019-06-21 DIAGNOSIS — I6601 Occlusion and stenosis of right middle cerebral artery: Secondary | ICD-10-CM | POA: Diagnosis present

## 2019-06-21 DIAGNOSIS — E1140 Type 2 diabetes mellitus with diabetic neuropathy, unspecified: Secondary | ICD-10-CM | POA: Diagnosis present

## 2019-06-21 DIAGNOSIS — E1169 Type 2 diabetes mellitus with other specified complication: Secondary | ICD-10-CM | POA: Diagnosis present

## 2019-06-21 DIAGNOSIS — I6389 Other cerebral infarction: Secondary | ICD-10-CM | POA: Diagnosis not present

## 2019-06-21 DIAGNOSIS — Z8041 Family history of malignant neoplasm of ovary: Secondary | ICD-10-CM | POA: Diagnosis not present

## 2019-06-21 DIAGNOSIS — N189 Chronic kidney disease, unspecified: Secondary | ICD-10-CM

## 2019-06-21 DIAGNOSIS — K219 Gastro-esophageal reflux disease without esophagitis: Secondary | ICD-10-CM | POA: Diagnosis present

## 2019-06-21 DIAGNOSIS — Z888 Allergy status to other drugs, medicaments and biological substances status: Secondary | ICD-10-CM | POA: Diagnosis not present

## 2019-06-21 DIAGNOSIS — Z9049 Acquired absence of other specified parts of digestive tract: Secondary | ICD-10-CM | POA: Diagnosis not present

## 2019-06-21 DIAGNOSIS — Z885 Allergy status to narcotic agent status: Secondary | ICD-10-CM | POA: Diagnosis not present

## 2019-06-21 DIAGNOSIS — R29704 NIHSS score 4: Secondary | ICD-10-CM | POA: Diagnosis not present

## 2019-06-21 DIAGNOSIS — Z823 Family history of stroke: Secondary | ICD-10-CM | POA: Diagnosis not present

## 2019-06-21 DIAGNOSIS — I129 Hypertensive chronic kidney disease with stage 1 through stage 4 chronic kidney disease, or unspecified chronic kidney disease: Secondary | ICD-10-CM | POA: Diagnosis present

## 2019-06-21 DIAGNOSIS — D631 Anemia in chronic kidney disease: Secondary | ICD-10-CM | POA: Diagnosis present

## 2019-06-21 DIAGNOSIS — E1122 Type 2 diabetes mellitus with diabetic chronic kidney disease: Secondary | ICD-10-CM | POA: Diagnosis present

## 2019-06-21 DIAGNOSIS — R413 Other amnesia: Secondary | ICD-10-CM | POA: Diagnosis present

## 2019-06-21 DIAGNOSIS — I10 Essential (primary) hypertension: Secondary | ICD-10-CM | POA: Diagnosis not present

## 2019-06-21 DIAGNOSIS — R269 Unspecified abnormalities of gait and mobility: Secondary | ICD-10-CM | POA: Diagnosis present

## 2019-06-21 DIAGNOSIS — D649 Anemia, unspecified: Secondary | ICD-10-CM

## 2019-06-21 DIAGNOSIS — E782 Mixed hyperlipidemia: Secondary | ICD-10-CM | POA: Diagnosis present

## 2019-06-21 DIAGNOSIS — Z20822 Contact with and (suspected) exposure to covid-19: Secondary | ICD-10-CM | POA: Diagnosis present

## 2019-06-21 LAB — GLUCOSE, CAPILLARY
Glucose-Capillary: 246 mg/dL — ABNORMAL HIGH (ref 70–99)
Glucose-Capillary: 293 mg/dL — ABNORMAL HIGH (ref 70–99)

## 2019-06-21 LAB — LIPID PANEL
Cholesterol: 279 mg/dL — ABNORMAL HIGH (ref 0–200)
HDL: 38 mg/dL — ABNORMAL LOW (ref >40)
LDL Cholesterol: 169 mg/dL — ABNORMAL HIGH (ref 0–99)
Total CHOL/HDL Ratio: 7.3 RATIO
Triglycerides: 359 mg/dL — ABNORMAL HIGH (ref <150)
VLDL: 72 mg/dL — ABNORMAL HIGH (ref 0–40)

## 2019-06-21 LAB — HEMOGLOBIN A1C
Hgb A1c MFr Bld: 9.1 % — ABNORMAL HIGH (ref 4.8–5.6)
Mean Plasma Glucose: 214.47 mg/dL

## 2019-06-21 LAB — URINALYSIS, ROUTINE W REFLEX MICROSCOPIC
Bilirubin Urine: NEGATIVE
Glucose, UA: NEGATIVE mg/dL
Ketones, ur: NEGATIVE mg/dL
Nitrite: NEGATIVE
Protein, ur: 100 mg/dL — AB
Specific Gravity, Urine: 1.015 (ref 1.005–1.030)
pH: 5 (ref 5.0–8.0)

## 2019-06-21 LAB — SODIUM, URINE, RANDOM: Sodium, Ur: 54 mmol/L

## 2019-06-21 LAB — SARS CORONAVIRUS 2 BY RT PCR (HOSPITAL ORDER, PERFORMED IN ~~LOC~~ HOSPITAL LAB): SARS Coronavirus 2: NEGATIVE

## 2019-06-21 LAB — HIV ANTIBODY (ROUTINE TESTING W REFLEX): HIV Screen 4th Generation wRfx: NONREACTIVE

## 2019-06-21 LAB — CBG MONITORING, ED: Glucose-Capillary: 165 mg/dL — ABNORMAL HIGH (ref 70–99)

## 2019-06-21 LAB — CREATININE, URINE, RANDOM: Creatinine, Urine: 104.91 mg/dL

## 2019-06-21 MED ORDER — LORAZEPAM 2 MG/ML IJ SOLN
0.5000 mg | Freq: Once | INTRAMUSCULAR | Status: AC
  Administered 2019-06-21: 0.5 mg via INTRAVENOUS
  Filled 2019-06-21: qty 1

## 2019-06-21 MED ORDER — STROKE: EARLY STAGES OF RECOVERY BOOK
Freq: Once | Status: DC
  Filled 2019-06-21: qty 1

## 2019-06-21 MED ORDER — DICLOFENAC SODIUM 1 % EX GEL
1.0000 "application " | Freq: Four times a day (QID) | CUTANEOUS | Status: DC | PRN
  Filled 2019-06-21: qty 100

## 2019-06-21 MED ORDER — ACETAMINOPHEN 160 MG/5ML PO SOLN: 650.0000 mg | ORAL | Status: DC | PRN

## 2019-06-21 MED ORDER — INSULIN ASPART 100 UNIT/ML ~~LOC~~ SOLN
0.0000 [IU] | Freq: Three times a day (TID) | SUBCUTANEOUS | Status: DC
  Administered 2019-06-21: 2 [IU] via SUBCUTANEOUS
  Administered 2019-06-22 (×2): 5 [IU] via SUBCUTANEOUS

## 2019-06-21 MED ORDER — GABAPENTIN 300 MG PO CAPS
300.0000 mg | ORAL_CAPSULE | Freq: Every day | ORAL | Status: DC
  Administered 2019-06-21 – 2019-06-23 (×3): 300 mg via ORAL
  Filled 2019-06-21 (×3): qty 1

## 2019-06-21 MED ORDER — CARVEDILOL 12.5 MG PO TABS
12.5000 mg | ORAL_TABLET | Freq: Two times a day (BID) | ORAL | Status: DC
  Administered 2019-06-21 – 2019-06-24 (×6): 12.5 mg via ORAL
  Filled 2019-06-21 (×5): qty 1

## 2019-06-21 MED ORDER — SODIUM CHLORIDE 0.9 % IV SOLN: INTRAVENOUS | Status: DC

## 2019-06-21 MED ORDER — ACETAMINOPHEN 650 MG RE SUPP: 650.0000 mg | RECTAL | Status: DC | PRN

## 2019-06-21 MED ORDER — CLOPIDOGREL BISULFATE 75 MG PO TABS
75.0000 mg | ORAL_TABLET | Freq: Every day | ORAL | Status: DC
  Administered 2019-06-21 – 2019-06-24 (×4): 75 mg via ORAL
  Filled 2019-06-21 (×4): qty 1

## 2019-06-21 MED ORDER — HEPARIN SODIUM (PORCINE) 5000 UNIT/ML IJ SOLN
5000.0000 [IU] | Freq: Three times a day (TID) | INTRAMUSCULAR | Status: DC
  Administered 2019-06-21 – 2019-06-24 (×9): 5000 [IU] via SUBCUTANEOUS
  Filled 2019-06-21 (×9): qty 1

## 2019-06-21 MED ORDER — CLONIDINE HCL 0.2 MG PO TABS
0.2000 mg | ORAL_TABLET | Freq: Two times a day (BID) | ORAL | Status: DC
  Administered 2019-06-21 – 2019-06-24 (×7): 0.2 mg via ORAL
  Filled 2019-06-21 (×7): qty 1

## 2019-06-21 MED ORDER — SENNOSIDES-DOCUSATE SODIUM 8.6-50 MG PO TABS
1.0000 | ORAL_TABLET | Freq: Every day | ORAL | Status: DC | PRN
  Administered 2019-06-22: 1 via ORAL
  Filled 2019-06-21: qty 1

## 2019-06-21 MED ORDER — ASPIRIN 300 MG RE SUPP: 300.0000 mg | Freq: Every day | RECTAL | Status: DC

## 2019-06-21 MED ORDER — LORAZEPAM 1 MG PO TABS
1.0000 mg | ORAL_TABLET | Freq: Once | ORAL | Status: AC
  Administered 2019-06-21: 1 mg via ORAL
  Filled 2019-06-21: qty 1

## 2019-06-21 MED ORDER — ASPIRIN 325 MG PO TABS
325.0000 mg | ORAL_TABLET | Freq: Every day | ORAL | Status: DC
  Administered 2019-06-21 – 2019-06-22 (×2): 325 mg via ORAL
  Filled 2019-06-21 (×2): qty 1

## 2019-06-21 MED ORDER — LINACLOTIDE 145 MCG PO CAPS
145.0000 ug | ORAL_CAPSULE | ORAL | Status: DC | PRN
  Filled 2019-06-21: qty 1

## 2019-06-21 MED ORDER — ACETAMINOPHEN 325 MG PO TABS
650.0000 mg | ORAL_TABLET | ORAL | Status: DC | PRN
  Administered 2019-06-23: 650 mg via ORAL
  Filled 2019-06-21: qty 2

## 2019-06-21 MED ORDER — FLUTICASONE PROPIONATE 50 MCG/ACT NA SUSP
1.0000 | Freq: Every day | NASAL | Status: DC
  Administered 2019-06-22 – 2019-06-24 (×3): 1 via NASAL
  Filled 2019-06-21: qty 16

## 2019-06-21 NOTE — ED Provider Notes (Signed)
[facility] EMERGENCY DEPARTMENT Provider Note   CSN: [id number] Arrival date & time: 06/20/19  1553     History Chief Complaint  Patient presents with  . Numbness    Briana Garrison is a 69 y.o. female.  HPI     This is a 69 year old female with a history of diabetes, hypertension who presents with left-sided numbness and weakness.  Patient reports onset of symptoms on Thursday of last week.  At baseline she is normally ambulatory without assistance.  However since last Thursday weakness and numbness of the left side have affected her ability to ambulate independently.  She reports that she has to hold onto things tightly.  Daughter reports that she noted 1 month ago patient had a single episode of slurred speech which the daughter noted.  This did self resolved.  Patient described as "my tongue felt thick."  No known history of atrial fibrillation or stroke.  Not on any anticoagulants.  Patient denies any recent illness, fevers, cough.  Patient was seen by her primary physician who referred her to the ED.  Past Medical History:  Diagnosis Date  . Cancer of kidney ([place]) 05/14/2009  . Diabetes mellitus   . Endometrial mass   . GERD (gastroesophageal reflux disease)   . Hypercholesterolemia   . Hypertension   . Internal hemorrhoids    S/P colonoscopy 06/10/00 by Dr. Lajoyce Corners  . Shingles     Patient Active Problem List   Diagnosis Date Noted  . Left ventricular hypertrophy 11/26/2018  . CKD stage G3b/A3, GFR 30-44 and albumin creatinine ratio >300 mg/g 11/26/2018  . Change in stool 08/03/2018  . Choledocholithiasis with obstruction 02/07/2017  . AKI (acute kidney injury) ([place]) 02/07/2017  . ARF (acute renal failure) ([place]) 06/23/2011  . Hypoglycemia associated with diabetes ([place]) 06/23/2011  . DM (diabetes mellitus) ([place]) 06/23/2011  . Essential hypertension 06/23/2011  . Hyperlipidemia 06/23/2011  . Normocytic anemia 06/23/2011  . UTI (lower urinary tract  infection) 06/23/2011    Past Surgical History:  Procedure Laterality Date  . BIOPSY  09/06/2018   Procedure: BIOPSY;  Surgeon: Rogene Houston, MD;  [facility];  Service: Endoscopy;;  . CHOLECYSTECTOMY  11/13/08  . COLONOSCOPY N/A 09/06/2018   Procedure: COLONOSCOPY;  Surgeon: Rogene Houston, MD;  [facility];  Service: Endoscopy;  Laterality: N/A;  730  . CYSTOSCOPY KIDNEY W/ URETERAL GUIDE WIRE  05/14/09  . Diagnostic hysteroscopy, D&C, endometrial polypectomy  07/01/2005  . LIPECTOMY    . POLYPECTOMY  09/06/2018   Procedure: POLYPECTOMY;  Surgeon: Rogene Houston, MD;  [facility];  Service: Endoscopy;;     OB History   No obstetric history on file.     Family History  Problem Relation Age of Onset  . Ovarian cancer Mother   . Stroke Mother   . Stroke Father     Social History   Tobacco Use  . Smoking status: Never Smoker  . Smokeless tobacco: Never Used  Substance Use Topics  . Alcohol use: No  . Drug use: No    Home Medications Prior to Admission medications   Medication Sig Start Date End Date Taking? Authorizing Provider  albuterol (PROVENTIL HFA;VENTOLIN HFA) 108 (90 Base) MCG/ACT inhaler Inhale 2 puffs into the lungs every 4 (four) hours as needed for wheezing or shortness of breath (or coughing). 6/38/75   Delora Fuel, MD  aspirin EC 81 MG tablet Take 81 mg by mouth daily.  [provider]  carvedilol (COREG) 12.5 MG tablet Take 12.5 mg by mouth 2 (two) times daily with a meal.    [provider]  cholecalciferol (VITAMIN D) 1000 UNITS tablet Take 1,000 Units by mouth 2 (two) times daily.     [provider]  cloNIDine (CATAPRES) 0.1 MG tablet Take 0.1-0.2 mg by mouth 2 (two) times daily. 0.1 MG in AM and 0.2 MG in  PM    [provider]  docusate sodium (COLACE) 50 MG capsule Take 50 mg by mouth daily as needed for mild constipation.    [provider]  furosemide (LASIX) 20  MG tablet Take 20 mg by mouth 2 (two) times daily.     [provider]  glipiZIDE (GLUCOTROL) 5 MG tablet Take by mouth daily before breakfast.    [provider]  insulin glargine (LANTUS) 100 UNIT/ML injection Inject 40 Units into the skin at bedtime. Patient taking differently: Inject 60 Units into the skin at bedtime. 20 in am and 40 in pm 06/28/11   Leana Gamer, MD  insulin lispro (HUMALOG) 100 UNIT/ML KiwkPen Inject 14-18 Units into the skin 3 (three) times daily. 14 units with breakfast and lunch, 18 units with supper    [provider]  linaclotide (LINZESS) 145 MCG CAPS capsule Take 145 mcg by mouth as needed.     [provider]    Allergies    Amlodipine and Tramadol  Review of Systems   Review of Systems  Constitutional: Negative for fever.  Respiratory: Negative for shortness of breath.   Cardiovascular: Negative for chest pain.  Gastrointestinal: Negative for abdominal pain, nausea and vomiting.  Genitourinary: Negative for dysuria.  Musculoskeletal: Positive for gait problem.  Neurological: Positive for speech difficulty, weakness and numbness.  All other systems reviewed and are negative.   Physical Exam Updated Vital Signs BP (!) 152/76   Pulse 66   Temp 98.6 F (37 C)   Resp 17   Ht 1.549 m (5\' 1" )   Wt 89.4 kg   SpO2 99%   BMI 37.22 kg/m   Physical Exam Vitals and nursing note reviewed.  Constitutional:      Appearance: She is well-developed. She is obese. She is not ill-appearing.  HENT:     Head: Normocephalic and atraumatic.     Nose: Nose normal.     Mouth/Throat:     Mouth: Mucous membranes are moist.  Eyes:     Pupils: Pupils are equal, round, and reactive to light.  Cardiovascular:     Rate and Rhythm: Normal rate and regular rhythm.     Heart sounds: Normal heart sounds.  Pulmonary:     Effort: Pulmonary effort is normal. No respiratory distress.     Breath sounds: No wheezing.  Abdominal:      General: Bowel sounds are normal.     Palpations: Abdomen is soft.     Tenderness: There is no abdominal tenderness.  Musculoskeletal:     Cervical back: Neck supple.     Right lower leg: No edema.     Left lower leg: No edema.  Skin:    General: Skin is warm and dry.  Neurological:     Mental Status: She is alert and oriented to person, place, and time.     Comments: Cranial nerves II through XII intact, fluent speech, no obvious facial droop, 5 out of 5 strength in all 4 extremities, slight drift noted left upper extremity, tentative gait  but no obvious favoring, subjective decreased sensation left upper and left lower extremity as well as face  Psychiatric:        Mood and Affect: Mood normal.     ED Results / Procedures / Treatments   Labs (all labs ordered are listed, but only abnormal results are displayed) Labs Reviewed  CBC - Abnormal; Notable for the following components:      Result Value   Hemoglobin 11.9 (*)    All other components within normal limits  COMPREHENSIVE METABOLIC PANEL - Abnormal; Notable for the following components:   BUN 53 (*)    Creatinine, Ser 2.31 (*)    AST 14 (*)    GFR calc non Af Amer 21 (*)    GFR calc Af Amer 24 (*)    All other components within normal limits  I-STAT CHEM 8, ED - Abnormal; Notable for the following components:   BUN 50 (*)    Creatinine, Ser 2.50 (*)    Hemoglobin 11.9 (*)    HCT 35.0 (*)    All other components within normal limits  SARS CORONAVIRUS 2 BY RT PCR (HOSPITAL ORDER, [facility])  PROTIME-INR  APTT  DIFFERENTIAL  CBG MONITORING, ED  CBG MONITORING, ED    EKG None  Radiology No results found.  Procedures Procedures (including critical care time)  Medications Ordered in ED Medications  sodium chloride flush (NS) 0.9 % injection 3 mL (has no administration in time range)    ED Course  I have reviewed the triage vital signs and the nursing notes.  Pertinent labs &  imaging results that were available during my care of the patient were reviewed by me and considered in my medical decision making (see chart for details).  Clinical Course as of Jun 20 617  Tue Jun 21, 2019  2458 Discussed with Dr. Leonel Ramsay.  Recommends MRI.  If negative, patient can have outpatient work-up as long as she is safe ambulating.  If stroke, would recommend admission for further stroke work-up.   [CH]    Clinical Course User Index [CH] Kitai Purdom, Barbette Hair, MD   MDM Rules/Calculators/A&P                      Patient presents with left-sided numbness/weakness.  Also reports gait disturbance and a single episode of slurred speech earlier in the month.  She is overall nontoxic and vital signs notable for blood pressure of 152/78.  She has very subtle findings on her neurologic exam with some slight gait in the left upper extremity and subjective sensory abnormalities.  She appears to have intact strength.  Her gait is guarded and she and her daughter state that this is very abnormal for her.  There is no obvious tremor.  Considerations include but not limited to stroke, TIA.  Lab work reviewed and largely reassuring or at the patient's baseline.  She has chronic kidney disease.  I did discuss the patient with Dr. Leonel Ramsay, neurology.  See clinical course above.  Will obtain MRI.  Disposition pending MRI.   Final Clinical Impression(s) / ED Diagnoses Final diagnoses:  Left-sided weakness  Gait disturbance    Rx / DC Orders ED Discharge Orders    None       Jomarie Gellis, Barbette Hair, MD 06/21/19 [phone]

## 2019-06-21 NOTE — H&P (Addendum)
History and Physical    Briana Garrison WRU:[id number] DOB: 12-22-1950 DOA: 06/20/2019  Referring MD/NP/PA: Virgel Manifold, MD PCP: [name], MD  Patient coming from: Home  Chief Complaint: Left-sided numbness  I have personally briefly reviewed patient's old medical records in [place]   HPI: Briana Garrison is a 69 y.o. right-handed female with medical history significant of hypertension, hyperlipidemia, diabetes mellitus type 2, renal cell carcinoma s/p partial nephrectomy and GERD presents with complaints of 5 days of left-sided numbness.  History is obtained from the patient and with help of her daughter who is present at bedside.  Patient had been sitting down and was having difficulty getting up when symptoms first started.  Normally patient is able to ambulate without need for assistance.  Who after her daughter helped her get up seem to be off balance and wobbly.  Therefore her daughter had given her cane to help stabilize herself.  She reported having numbness sensation that radiated from her face on the left side all the way down to her feet.  Associated symptoms included complaints of a headache, some mild left sided weakness, numbness in the right hand, some slurred speech, intermittent difficulty swallowing liquids, nausea, and decreased memory reported to be repeating herself.  Denies having any abdominal pain, palpitations, chest pain, dysuria, shortness of breath, or cough.  Patient had been working really hard to control diabetes and cholesterol so that she could have rotator cuff surgery.   ED Course: Upon admission into the emergency department patient was seen to be afebrile with pulse 58-1 03, blood pressures 121/106-185/89, and O2 saturations maintained.  Patient was noted to be out of the window for TPA.  Labs significant for hemoglobin 11.9, BUN 53, creatinine 2.31, and glucose 76.  COVID-19 screening was negative.  MRI revealed acute lacunar infarct of the right  thalamus.  TRH called to admit.  Review of Systems  Constitutional: Positive for malaise/fatigue. Negative for fever.  HENT: Negative for congestion and sinus pain.   Eyes: Negative for blurred vision and discharge.  Respiratory: Negative for cough and shortness of breath.   Cardiovascular: Positive for leg swelling. Negative for chest pain and palpitations.  Gastrointestinal: Positive for nausea. Negative for abdominal pain and vomiting.  Genitourinary: Negative for hematuria and urgency.  Musculoskeletal: Positive for joint pain. Negative for falls.  Skin: Negative for rash.  Neurological: Positive for sensory change, speech change, focal weakness and headaches.  Psychiatric/Behavioral: Positive for memory loss. Negative for substance abuse.    Past Medical History:  Diagnosis Date  . Cancer of kidney ([place]) 05/14/2009  . Diabetes mellitus   . Endometrial mass   . GERD (gastroesophageal reflux disease)   . Hypercholesterolemia   . Hypertension   . Internal hemorrhoids    S/P colonoscopy 06/10/00 by Dr. Lajoyce Corners  . Shingles     Past Surgical History:  Procedure Laterality Date  . BIOPSY  09/06/2018   Procedure: BIOPSY;  Surgeon: Rogene Houston, MD;  [facility];  Service: Endoscopy;;  . CHOLECYSTECTOMY  11/13/08  . COLONOSCOPY N/A 09/06/2018   Procedure: COLONOSCOPY;  Surgeon: Rogene Houston, MD;  Location: [facility];  Service: Endoscopy;  Laterality: N/A;  730  . CYSTOSCOPY KIDNEY W/ URETERAL GUIDE WIRE  05/14/09  . Diagnostic hysteroscopy, D&C, endometrial polypectomy  07/01/2005  . LIPECTOMY    . POLYPECTOMY  09/06/2018   Procedure: POLYPECTOMY;  Surgeon: Rogene Houston, MD;  [facility]: [facility];  Service:  Endoscopy;;     reports that she has never smoked. She has never used smokeless tobacco. She reports that she does not drink alcohol or use drugs.  Allergies  Allergen Reactions  . Amlodipine Swelling  . Tramadol Diarrhea and Nausea And Vomiting     Family History  Problem Relation Age of Onset  . Ovarian cancer Mother   . Stroke Mother   . Stroke Father     Prior to Admission medications   Medication Sig Start Date End Date Taking? Authorizing Provider  albuterol (PROVENTIL HFA;VENTOLIN HFA) 108 (90 Base) MCG/ACT inhaler Inhale 2 puffs into the lungs every 4 (four) hours as needed for wheezing or shortness of breath (or coughing). 6/94/85  Yes Delora Fuel, MD  aspirin EC 81 MG tablet Take 81 mg by mouth daily.   Yes [provider]  carvedilol (COREG) 12.5 MG tablet Take 12.5 mg by mouth 2 (two) times daily with a meal.   Yes [provider]  Cholecalciferol (VITAMIN D-3) 25 MCG (1000 UT) CAPS Take 2 capsules by mouth in the morning and at bedtime.   Yes [provider]  cloNIDine (CATAPRES) 0.1 MG tablet Take 0.2 mg by mouth 2 (two) times daily.    Yes [provider]  Cyanocobalamin (VITAMIN B-12) 2500 MCG SUBL Place 1 tablet under the tongue daily.   Yes [provider]  diclofenac Sodium (VOLTAREN) 1 % GEL Apply 1 application topically 4 (four) times daily as needed for pain.   Yes [provider]  fluticasone (FLONASE) 50 MCG/ACT nasal spray Place 1 spray into both nostrils daily. 05/24/19  Yes [provider]  furosemide (LASIX) 20 MG tablet Take 20 mg by mouth 2 (two) times daily.    Yes [provider]  gabapentin (NEURONTIN) 300 MG capsule Take 300 mg by mouth at bedtime.   Yes [provider]  insulin lispro (HUMALOG) 100 UNIT/ML KiwkPen Inject 10-22 Units into the skin 3 (three) times daily. Breakfast/Lunch  100-150=10units 151-200=15units 201-250=20 units  Evening 100-150=12units 151-200=18 units 201-250=22units   Yes [provider]  LANTUS SOLOSTAR 100 UNIT/ML Solostar Pen Inject 30-35 Units into the skin See admin instructions. Take 30 units in the morning and 35 units in the evening. 05/19/19  Yes [provider]   linaclotide (LINZESS) 145 MCG CAPS capsule Take 145 mcg by mouth as needed (Constipation).    Yes [provider]  senna-docusate (SENOKOT-S) 8.6-50 MG tablet Take 1 tablet by mouth daily as needed for mild constipation.   Yes [provider]  insulin glargine (LANTUS) 100 UNIT/ML injection Inject 40 Units into the skin at bedtime. Patient not taking: Reported on 06/21/2019 06/28/11   Leana Gamer, MD    Physical Exam:  Constitutional: Elderly female who appears to be in no acute distress at this time. Vitals:   06/21/19 0321 06/21/19 0615 06/21/19 0630 06/21/19 0900  BP: (!) 152/76 (!) 160/64 (!) 185/89 (!) 121/106  Pulse: 66 67 70 80  Resp: 17 20 18 15   Temp: 98.6 F (37 C)     TempSrc:      SpO2: 99% 100% 100% 100%  Weight:      Height:       Eyes: PERRL, lids and conjunctivae normal ENMT: Mucous membranes are dry. Posterior pharynx clear of any exudate or lesions.  Neck: normal, supple, no masses, no thyromegaly Respiratory: clear to auscultation bilaterally, no wheezing, no crackles. Normal respiratory effort. No accessory muscle use.  Cardiovascular: Regular  rate and rhythm, no murmurs / rubs / gallops.  Trace lower extremity edema. 2+ pedal pulses. No carotid bruits.  Abdomen: no tenderness, no masses palpated. No hepatosplenomegaly. Bowel sounds positive.  Musculoskeletal: no clubbing / cyanosis. No joint deformity upper and lower extremities.  Decreased range of motion of the right shoulder Skin: no rashes, lesions, ulcers. No induration Neurologic: CN 2-12 grossly intact.  Abnormal sensation noted on the left side DTR normal. Strength 5/5 in all 4.  Psychiatric: Normal judgment and insight. Alert and oriented x 3. Normal mood.     Labs on Admission: I have personally reviewed following labs and imaging studies  CBC: Recent Labs  Lab 06/20/19 1612 06/20/19 1642  WBC 8.6  --   NEUTROABS 5.1  --   HGB 11.9* 11.9*  HCT 37.5 35.0*  MCV 89.3   --   PLT 319  --    Basic Metabolic Panel: Recent Labs  Lab 06/20/19 1612 06/20/19 1642  NA 138 139  K 4.3 4.1  CL 101 105  CO2 24  --   GLUCOSE 76 71  BUN 53* 50*  CREATININE 2.31* 2.50*  CALCIUM 9.6  --    GFR: Estimated Creatinine Clearance: 21.6 mL/min (A) (by C-G formula based on SCr of 2.5 mg/dL (H)). Liver Function Tests: Recent Labs  Lab 06/20/19 1612  AST 14*  ALT 16  ALKPHOS 83  BILITOT 0.5  PROT 7.2  ALBUMIN 3.5   No results for input(s): LIPASE, AMYLASE in the last 168 hours. No results for input(s): AMMONIA in the last 168 hours. Coagulation Profile: Recent Labs  Lab 06/20/19 1612  INR 1.0   Cardiac Enzymes: No results for input(s): CKTOTAL, CKMB, CKMBINDEX, TROPONINI in the last 168 hours. BNP (last 3 results) No results for input(s): PROBNP in the last 8760 hours. HbA1C: No results for input(s): HGBA1C in the last 72 hours. CBG: Recent Labs  Lab 06/20/19 1737 06/20/19 1946  GLUCAP 72 97   Lipid Profile: No results for input(s): CHOL, HDL, LDLCALC, TRIG, CHOLHDL, LDLDIRECT in the last 72 hours. Thyroid Function Tests: No results for input(s): TSH, T4TOTAL, FREET4, T3FREE, THYROIDAB in the last 72 hours. Anemia Panel: No results for input(s): VITAMINB12, FOLATE, FERRITIN, TIBC, IRON, RETICCTPCT in the last 72 hours. Urine analysis:    Component Value Date/Time   COLORURINE YELLOW 02/06/2017 1951   APPEARANCEUR CLEAR 02/06/2017 1951   LABSPEC 1.016 02/06/2017 1951   PHURINE 5.0 02/06/2017 1951   GLUCOSEU 50 (A) 02/06/2017 1951   HGBUR NEGATIVE 02/06/2017 [place] NEGATIVE 02/06/2017 [place] NEGATIVE 02/06/2017 1951   PROTEINUR 100 (A) 02/06/2017 1951   UROBILINOGEN 0.2 06/23/2011 1100   NITRITE NEGATIVE 02/06/2017 1951   LEUKOCYTESUR SMALL (A) 02/06/2017 1951   Sepsis Labs: No results found for this or any previous visit (from the past 240 hour(s)).   Radiological Exams on Admission: DG Chest 2 View  Result  Date: 06/21/2019 CLINICAL DATA:  Left-sided numbness.  Left leg weakness. EXAM: CHEST - 2 VIEW COMPARISON:  06/07/2017 FINDINGS: Cardiac silhouette is normal in size. No mediastinal or hilar masses. No evidence of adenopathy. Clear lungs.  No pleural effusion or pneumothorax. Skeletal structures are intact. IMPRESSION: No active cardiopulmonary disease. Electronically Signed   By: Lajean Manes M.D.   On: 06/21/2019 10:08   MR BRAIN WO CONTRAST  Result Date: 06/21/2019 CLINICAL DATA:  Left-sided numbness and weakness EXAM: MRI HEAD WITHOUT CONTRAST TECHNIQUE: Multiplanar, multiecho pulse sequences of the brain  and surrounding structures were obtained without intravenous contrast. COMPARISON:  None. FINDINGS: Brain: Ovoid acute infarct in the right thalamus measuring 1 cm. Remote bilateral thalamic and deep gray matter infarcts. Remote infarct in the pons with wallerian changes crossing the pons and extending into the middle cerebellar peduncles. Post ischemic wallerian changes also across the anterior corpus callosum body. No acute hemorrhage, hydrocephalus, collection, or masslike finding. Vascular: Normal flow voids. Skull and upper cervical spine: Normal marrow signal Sinuses/Orbits: Retention cysts in the maxillary and left sphenoid sinuses with mucosal thickening. Bilateral cataract resection. Partial right mastoid opacification with negative nasopharynx. IMPRESSION: 1. Acute lacunar infarct in the right thalamus. 2. Chronic small vessel ischemia including multiple remote lacunar infarcts. Electronically Signed   By: Monte Fantasia M.D.   On: 06/21/2019 07:34    Chest x-ray: Independently reviewed.  No acute abnormalities  Assessment/Plan CVA: Subacute.  Patient presents with complaints of 5 days of left-sided numbness.  Found to have acute lacunar infarct of the right thalamus.  His factors include hypertension, hyperlipidemia, and diabetes -Admit to telemetry bed -Stroke order set  initiated -Neuro checks -add-on Hemoglobin A1c and lipid panel in a.m. -Check carotid vascular Doppler ultrasound due to kidney -Check echocardiogram -PT/OT/Speech to eval and treat -ASA -Check EKG -Follow-up telemetry overnight -Social work consult  -[facility] consultative services, will follow-up for further recommendation  Acute kidney injury superimposed on chronic kidney disease 3B: Acute.  Patient presents with creatinine elevated up to 2.3 with BUN 53.  Elevated BUN to creatinine ratio suggestive prerenal cause of symptoms.   -Check FeNa and FeUr -Check urinalysis -Check renal ultrasound -IV fluids at 75 mL/h -Recheck kidney function in a.m. -Hold nephrotoxic agents  Essential hypertension: Patient is the window for permissive hypertension at this time.  Home blood pressure medications include Coreg 12.5 mg twice daily, clonidine 0.2 mg twice daily, and furosemide 20 mg twice daily. -Continue home medications of Coreg and clonidine -Hold furosemide due to AKI  Diabetes mellitus type 2 with neuropathy:-Patient presents with blood sugars low normal at 76.  At home patient had been working hard to get better control of her blood sugars. -Hypoglycemic protocols -Continue gabapentin -CBGs before every meal with sensitive SSI  -Adjust regimen as needed  Normocytic anemia: Stable.  Hemoglobin 11.9 which appears to be near patient's baseline. -Continue to monitor  History of renal cell carcinoma: Patient status post partial nephrectomy. -Follow-up renal ultrasound  Hyperlipidemia -Follow-up lipid panel -Goal LDL less than 70 -We will follow-up lipid panel and add on atorvastatin   DVT prophylaxis: Heparin Code Status: Full Family Communication: Daughter updated at bedside Disposition Plan: Likely discharge home once medically stable Consults called: Neurology Admission status: Inpatient   Norval Morton MD [facility] Pager [phone]   If  7PM-7AM, please contact night-coverage [email or web address] Password [facility]  06/21/2019, 9:24 AM

## 2019-06-21 NOTE — ED Notes (Signed)
Lunch Tray Ordered @ 1041. 

## 2019-06-21 NOTE — ED Notes (Signed)
Pt transported to xray 

## 2019-06-21 NOTE — Consult Note (Addendum)
Neurology Consultation  Reason for Consult: Stroke Referring Physician: Tamala Julian  CC: Left-sided decreased sensation  History is obtained from: Patient  HPI: DEL WISEMAN is a 69 y.o. female with history of uncontrolled diabetes, hypertension, hypercholesterolemia who approximately 5 to 6 days ago noted sudden onset of left face arm and leg decreased sensation.  Patient thought it would go away thus did not come to the hospital.  As it did not go away patient came to the hospital to seek further work-up.  Currently patient states that it may have improved slightly but still is persistent.  She also states that she is weak in her right arm but knows that she is got a RCT in her right shoulder.  She states other than the numbness in her left side she does have mild numbness in her right hand.  However this is located only in her thumb, index finger and middle finger.  She states that she does have to wake up and shake her hand at night.  She also states that this has been going on prior to the events that led up to her left-sided decreased sensation.   ED course  Relevant labs include -AST 14, creatinine 2.31, BUN 53 MRI head shows-right thalamic lacunar infarct  LKW: 06/16/2019 tpa given?: no, 5 days out of window Premorbid modified Rankin scale (mRS): 0 NIH stroke score of 2   Past Medical History:  Diagnosis Date   Cancer of kidney ([place]) 05/14/2009   Diabetes mellitus    Endometrial mass    GERD (gastroesophageal reflux disease)    Hypercholesterolemia    Hypertension    Internal hemorrhoids    S/P colonoscopy 06/10/00 by Dr. Lajoyce Corners   Shingles    Family History  Problem Relation Age of Onset   Ovarian cancer Mother    Stroke Mother    Stroke Father     Social History:   reports that she has never smoked. She has never used smokeless tobacco. She reports that she does not drink alcohol or use drugs.  Medications  Current Facility-Administered Medications:     stroke: mapping  our early stages of recovery book, , Does not apply, Once, Smith, Rondell A, MD   0.9 %  sodium chloride infusion, , Intravenous, Continuous, Fuller Plan A, MD, Last Rate: 75 mL/hr at 06/21/19 1115, New Bag at 06/21/19 1115   acetaminophen (TYLENOL) tablet 650 mg, 650 mg, Oral, Q4H PRN **OR** acetaminophen (TYLENOL) 160 MG/5ML solution 650 mg, 650 mg, Per Tube, Q4H PRN **OR** acetaminophen (TYLENOL) suppository 650 mg, 650 mg, Rectal, Q4H PRN, Tamala Julian, Rondell A, MD   aspirin suppository 300 mg, 300 mg, Rectal, Daily **OR** aspirin tablet 325 mg, 325 mg, Oral, Daily, Smith, Rondell A, MD, 325 mg at 06/21/19 1038   carvedilol (COREG) tablet 12.5 mg, 12.5 mg, Oral, BID WC, Smith, Rondell A, MD   cloNIDine (CATAPRES) tablet 0.2 mg, 0.2 mg, Oral, BID, Smith, Rondell A, MD, 0.2 mg at 06/21/19 1038   diclofenac Sodium (VOLTAREN) 1 % topical gel 1 application, 1 application, Topical, QID PRN, Tamala Julian, Rondell A, MD   fluticasone (FLONASE) 50 MCG/ACT nasal spray 1 spray, 1 spray, Each Nare, Daily, Smith, Rondell A, MD   gabapentin (NEURONTIN) capsule 300 mg, 300 mg, Oral, QHS, Smith, Rondell A, MD   heparin injection 5,000 Units, 5,000 Units, Subcutaneous, Q8H, Smith, Rondell A, MD, 5,000 Units at 06/21/19 1039   insulin aspart (novoLOG) injection 0-9 Units, 0-9 Units, Subcutaneous, TID WC, Smith, Rondell A,  MD, 2 Units at 06/21/19 1307   linaclotide (LINZESS) capsule 145 mcg, 145 mcg, Oral, PRN, Tamala Julian, Rondell A, MD   senna-docusate (Senokot-S) tablet 1 tablet, 1 tablet, Oral, Daily PRN, Tamala Julian, Rondell A, MD   sodium chloride flush (NS) 0.9 % injection 3 mL, 3 mL, Intravenous, Once, Smith, Rondell A, MD  Current Outpatient Medications:    albuterol (PROVENTIL HFA;VENTOLIN HFA) 108 (90 Base) MCG/ACT inhaler, Inhale 2 puffs into the lungs every 4 (four) hours as needed for wheezing or shortness of breath (or coughing)., Disp: 1 Inhaler, Rfl: 0   aspirin EC 81 MG tablet, Take 81 mg by mouth daily., Disp: ,  Rfl:    carvedilol (COREG) 12.5 MG tablet, Take 12.5 mg by mouth 2 (two) times daily with a meal., Disp: , Rfl:    Cholecalciferol (VITAMIN D-3) 25 MCG (1000 UT) CAPS, Take 2 capsules by mouth in the morning and at bedtime., Disp: , Rfl:    cloNIDine (CATAPRES) 0.1 MG tablet, Take 0.2 mg by mouth 2 (two) times daily. , Disp: , Rfl:    Cyanocobalamin (VITAMIN B-12) 2500 MCG SUBL, Place 1 tablet under the tongue daily., Disp: , Rfl:    diclofenac Sodium (VOLTAREN) 1 % GEL, Apply 1 application topically 4 (four) times daily as needed for pain., Disp: , Rfl:    fluticasone (FLONASE) 50 MCG/ACT nasal spray, Place 1 spray into both nostrils daily., Disp: , Rfl:    furosemide (LASIX) 20 MG tablet, Take 20 mg by mouth 2 (two) times daily. , Disp: , Rfl:    gabapentin (NEURONTIN) 300 MG capsule, Take 300 mg by mouth at bedtime., Disp: , Rfl:    insulin lispro (HUMALOG) 100 UNIT/ML KiwkPen, Inject 10-22 Units into the skin 3 (three) times daily. Breakfast/Lunch  100-150=10units 151-200=15units 201-250=20 units  Evening 100-150=12units 151-200=18 units 201-250=22units, Disp: , Rfl:    LANTUS SOLOSTAR 100 UNIT/ML Solostar Pen, Inject 30-35 Units into the skin See admin instructions. Take 30 units in the morning and 35 units in the evening., Disp: , Rfl:    linaclotide (LINZESS) 145 MCG CAPS capsule, Take 145 mcg by mouth as needed (Constipation). , Disp: , Rfl:    senna-docusate (SENOKOT-S) 8.6-50 MG tablet, Take 1 tablet by mouth daily as needed for mild constipation., Disp: , Rfl:    insulin glargine (LANTUS) 100 UNIT/ML injection, Inject 40 Units into the skin at bedtime. (Patient not taking: Reported on 06/21/2019), Disp: 10 mL, Rfl: 0  ROS:    General ROS: negative for - chills, fatigue, fever, night sweats, weight gain or weight loss Psychological ROS: negative for - behavioral disorder, hallucinations, memory difficulties, mood swings or suicidal ideation Ophthalmic ROS: negative for - blurry vision,  double vision, eye pain or loss of vision ENT ROS: negative for - epistaxis, nasal discharge, oral lesions, sore throat, tinnitus or vertigoe Respiratory ROS: negative for - cough, hemoptysis, shortness of breath or wheezing Cardiovascular ROS: negative for - chest pain, dyspnea on exertion, edema or irregular heartbeat Gastrointestinal ROS: negative for - abdominal pain, diarrhea, hematemesis, nausea/vomiting or stool incontinence Genito-Urinary ROS: negative for - dysuria, hematuria, incontinence or urinary frequency/urgency Musculoskeletal ROS: negative for - joint swelling or muscular weakness Neurological ROS: as noted in HPI Dermatological ROS: negative for rash and skin lesion changes  Exam: Current vital signs: BP (!) 169/81   Pulse 63   Temp 98.6 F (37 C)   Resp 17   Ht 5\' 1"  (1.549 m)   Wt 89.4 kg  SpO2 98%   BMI 37.22 kg/m  Vital signs in last 24 hours: Temp:  [97.7 F (36.5 C)-98.6 F (37 C)] 98.6 F (37 C) (05/25 0321) Pulse Rate:  [58-103] 63 (05/25 1345) Resp:  [12-20] 17 (05/25 1345) BP: (121-185)/(54-117) 169/81 (05/25 1345) SpO2:  [94 %-100 %] 98 % (05/25 1345) Weight:  [89.4 kg] 89.4 kg (05/24 1605)   Constitutional: Appears well-developed and well-nourished.  Eyes: No scleral injection HENT: No OP obstrucion Head: Normocephalic.  Cardiovascular: Normal rate and regular rhythm.  Respiratory: Effort normal, non-labored breathing GI: Soft.  No distension. There is no tenderness.  Skin: WDI  Neuro: Mental Status: Patient is awake, alert, oriented to person, place, month, year, and situation. Speech-shows no aphasia or dysarthria.  She is able to name, comprehension, repeat.  Patient is able to follow simple instructions. Patient is able to give a clear and coherent history. Cranial Nerves: II: Visual Fields are full.  III,IV, VI: EOMI-it does appear when looking left the right the eye that is moving medially lags behind the eye that is moving  laterally. Pupils equal, round and reactive to light V: Facial sensation is symmetric to temperature VII: Facial movement is symmetric.  VIII: hearing is intact to voice X: Palat elevates symmetrically XI: Shoulder shrug is symmetric. XII: tongue is midline without atrophy or fasciculations.  Motor: 5/5 strength in all extremities however right shoulder abduction is 4/5 strength secondary to rotator cuff tear Sensory: Sensation is symmetric to light touch and temperature in the arms and legs. DSS Deep Tendon Reflexes: 1+ and symmetric in the biceps and no patellae.  Plantars: Toes are downgoing bilaterally. Cerebellar: FNF and HKS are intact bilaterally  Labs I have reviewed labs in epic and the results pertinent to this consultation are:   CBC    Component Value Date/Time   WBC 8.6 06/20/2019 1612   RBC 4.20 06/20/2019 1612   HGB 11.9 (L) 06/20/2019 1642   HCT 35.0 (L) 06/20/2019 1642   PLT 319 06/20/2019 1612   MCV 89.3 06/20/2019 1612   MCH 28.3 06/20/2019 1612   MCHC 31.7 06/20/2019 1612   RDW 13.7 06/20/2019 1612   LYMPHSABS 2.1 06/20/2019 1612   MONOABS 1.0 06/20/2019 1612   EOSABS 0.3 06/20/2019 1612   BASOSABS 0.1 06/20/2019 1612    CMP     Component Value Date/Time   NA 139 06/20/2019 1642   K 4.1 06/20/2019 1642   CL 105 06/20/2019 1642   CO2 24 06/20/2019 1612   GLUCOSE 71 06/20/2019 1642   BUN 50 (H) 06/20/2019 1642   CREATININE 2.50 (H) 06/20/2019 1642   CALCIUM 9.6 06/20/2019 1612   PROT 7.2 06/20/2019 1612   ALBUMIN 3.5 06/20/2019 1612   AST 14 (L) 06/20/2019 1612   ALT 16 06/20/2019 1612   ALKPHOS 83 06/20/2019 1612   BILITOT 0.5 06/20/2019 1612   GFRNONAA 21 (L) 06/20/2019 1612   GFRAA 24 (L) 06/20/2019 1612    Lipid Panel     Component Value Date/Time   CHOL 279 (H) 06/21/2019 1115   TRIG 359 (H) 06/21/2019 1115   HDL 38 (L) 06/21/2019 1115   CHOLHDL 7.3 06/21/2019 1115   VLDL 72 (H) 06/21/2019 1115   LDLCALC 169 (H) 06/21/2019  1115     Imaging I have reviewed the images obtained:  MRI examination of the brain-acute lacunar infarct in the right thalamus.  Etta Quill PA-C [facility] [phone]  M-F  (9:00 am- 5:00 PM)  06/21/2019, 2:02  PM     Assessment:  69 year old female presented to the hospital after 6 days of having decreased sensation on her left face, arm, leg.  MRI confirmed right thalamic infarct.  Small vessel in origin.  On exam today patient continues have decreased sensation in her left face arm and leg.  She does have some weakness in her right shoulder however this is secondary to a rotator cuff tear.   Impression: -Right thalamic infarct  Recommend -Carotid Dopplers -MRA Head -Transthoracic Echo,   -Start patient on ASA 81 mg daily and Plavix 75 mg daily x3 weeks, then ASA or Plavix alone -Start or continue Atorvastatin 40 mg/other high intensity statin -BP goal: As she is 5 days out would keep systolic blood pressure less than 140 -HBAIC and Lipid profile -Telemetry monitoring -Frequent neuro checks -NPO until passes stroke swallow screen -PT/OT  # please page stroke NP  Or  PA  Or MD from 8am -4 pm  as this patient from this time will be  followed by the stroke.   You can look them up on [email or web address]  Password [email or web address]  NEUROHOSPITALIST ADDENDUM Performed a face to face diagnostic evaluation.   I have reviewed the contents of history and physical exam as documented by PA/ARNP/Resident and agree with above documentation.  I have discussed and formulated the above plan as documented. Edits to the note have been made as needed.  Lacunar stroke likely from uncontrolled DM, HLD. Will start DAPt x 3 weeks. MRA and carotid dopplers, echo.    Karena Addison Ibrahim Mcpheeters MD [facility] [phone]   If 7pm to 7am, please call on call as listed on AMION.

## 2019-06-21 NOTE — Progress Notes (Signed)
Carotid duplex has been completed.   Preliminary results in CV Proc.   Abram Sander 06/21/2019 4:01 PM

## 2019-06-22 ENCOUNTER — Inpatient Hospital Stay (HOSPITAL_COMMUNITY): Payer: Medicare PPO

## 2019-06-22 DIAGNOSIS — I6389 Other cerebral infarction: Secondary | ICD-10-CM

## 2019-06-22 DIAGNOSIS — N179 Acute kidney failure, unspecified: Secondary | ICD-10-CM

## 2019-06-22 LAB — UREA NITROGEN, URINE: Urea Nitrogen, Ur: 902 mg/dL

## 2019-06-22 LAB — BASIC METABOLIC PANEL
Anion gap: 8 (ref 5–15)
BUN: 40 mg/dL — ABNORMAL HIGH (ref 8–23)
CO2: 23 mmol/L (ref 22–32)
Calcium: 9.1 mg/dL (ref 8.9–10.3)
Chloride: 105 mmol/L (ref 98–111)
Creatinine, Ser: 1.73 mg/dL — ABNORMAL HIGH (ref 0.44–1.00)
GFR calc Af Amer: 34 mL/min — ABNORMAL LOW (ref >60)
GFR calc non Af Amer: 30 mL/min — ABNORMAL LOW (ref >60)
Glucose, Bld: 272 mg/dL — ABNORMAL HIGH (ref 70–99)
Potassium: 4.3 mmol/L (ref 3.5–5.1)
Sodium: 136 mmol/L (ref 135–145)

## 2019-06-22 LAB — GLUCOSE, CAPILLARY
Glucose-Capillary: 284 mg/dL — ABNORMAL HIGH (ref 70–99)
Glucose-Capillary: 261 mg/dL — ABNORMAL HIGH (ref 70–99)
Glucose-Capillary: 229 mg/dL — ABNORMAL HIGH (ref 70–99)
Glucose-Capillary: 211 mg/dL — ABNORMAL HIGH (ref 70–99)

## 2019-06-22 LAB — CBC
HCT: 35.2 % — ABNORMAL LOW (ref 36.0–46.0)
Hemoglobin: 11.4 g/dL — ABNORMAL LOW (ref 12.0–15.0)
MCH: 28.7 pg (ref 26.0–34.0)
MCHC: 32.4 g/dL (ref 30.0–36.0)
MCV: 88.7 fL (ref 80.0–100.0)
Platelets: 293 10*3/uL (ref 150–400)
RBC: 3.97 MIL/uL (ref 3.87–5.11)
RDW: 13.4 % (ref 11.5–15.5)
WBC: 5.1 10*3/uL (ref 4.0–10.5)
nRBC: 0 % (ref 0.0–0.2)

## 2019-06-22 LAB — ECHOCARDIOGRAM COMPLETE
Height: 61 in
Weight: 3152 oz

## 2019-06-22 MED ORDER — INSULIN ASPART 100 UNIT/ML ~~LOC~~ SOLN
0.0000 [IU] | Freq: Every day | SUBCUTANEOUS | Status: DC
  Administered 2019-06-22: 2 [IU] via SUBCUTANEOUS

## 2019-06-22 MED ORDER — ASPIRIN EC 81 MG PO TBEC
81.0000 mg | DELAYED_RELEASE_TABLET | Freq: Every day | ORAL | Status: DC
  Administered 2019-06-23 – 2019-06-24 (×2): 81 mg via ORAL
  Filled 2019-06-22 (×2): qty 1

## 2019-06-22 MED ORDER — INSULIN ASPART 100 UNIT/ML ~~LOC~~ SOLN
0.0000 [IU] | Freq: Three times a day (TID) | SUBCUTANEOUS | Status: DC
  Administered 2019-06-22: 5 [IU] via SUBCUTANEOUS
  Administered 2019-06-23: 2 [IU] via SUBCUTANEOUS
  Administered 2019-06-23: 5 [IU] via SUBCUTANEOUS
  Administered 2019-06-23: 2 [IU] via SUBCUTANEOUS

## 2019-06-22 MED ORDER — ATORVASTATIN CALCIUM 80 MG PO TABS
80.0000 mg | ORAL_TABLET | Freq: Every day | ORAL | Status: DC
  Administered 2019-06-22 – 2019-06-24 (×3): 80 mg via ORAL
  Filled 2019-06-22 (×3): qty 1

## 2019-06-22 MED ORDER — INSULIN ASPART 100 UNIT/ML ~~LOC~~ SOLN
4.0000 [IU] | Freq: Three times a day (TID) | SUBCUTANEOUS | Status: DC
  Administered 2019-06-22 – 2019-06-24 (×6): 4 [IU] via SUBCUTANEOUS

## 2019-06-22 MED ORDER — INSULIN GLARGINE 100 UNIT/ML ~~LOC~~ SOLN
30.0000 [IU] | Freq: Two times a day (BID) | SUBCUTANEOUS | Status: DC
  Administered 2019-06-22 – 2019-06-23 (×2): 30 [IU] via SUBCUTANEOUS
  Filled 2019-06-22 (×3): qty 0.3

## 2019-06-22 MED ORDER — HYDRALAZINE HCL 20 MG/ML IJ SOLN
10.0000 mg | Freq: Once | INTRAMUSCULAR | Status: AC
  Administered 2019-06-22: 10 mg via INTRAVENOUS
  Filled 2019-06-22: qty 1

## 2019-06-22 NOTE — Care Management (Signed)
Consult for home health vs SNF . Await PT eval.   Magdalen Spatz RN

## 2019-06-22 NOTE — Progress Notes (Signed)
STROKE TEAM PROGRESS NOTE   INTERVAL HISTORY Her girlfriend is at the bedside as well as echo technicians. She recounted HPI to Dr. Leonie Man.  She presented with 5 to 6-day history of left-sided numbness and MRI shows right thalamic lacunar infarct and multiple old lacunar wounds.  MRA shows some mild bilateral cavernous carotid and right M2 middle cerebral artery atherosclerosis.  LDL cholesterol is significantly elevated at 169 mg percent and hemoglobin A1c at 9.1.  Echocardiogram is pending.  Vitals:   06/22/19 0002 06/22/19 0517 06/22/19 0911 06/22/19 1007  BP: (!) 164/76 (!) 146/64 (!) 186/68 (!) 190/77  Pulse: 74 70 69 67  Resp: 16 16  16   Temp: 97.8 F (36.6 C) 98.3 F (36.8 C)  (!) 97.4 F (36.3 C)  TempSrc: Oral Oral  Oral  SpO2: 95% 97%  100%  Weight:      Height:        CBC:  Recent Labs  Lab 06/20/19 1612 06/20/19 1612 06/20/19 1642 06/22/19 0450  WBC 8.6  --   --  5.1  NEUTROABS 5.1  --   --   --   HGB 11.9*   < > 11.9* 11.4*  HCT 37.5   < > 35.0* 35.2*  MCV 89.3  --   --  88.7  PLT 319  --   --  293   < > = values in this interval not displayed.    Basic Metabolic Panel:  Recent Labs  Lab 06/20/19 1612 06/20/19 1612 06/20/19 1642 06/22/19 0450  NA 138   < > 139 136  K 4.3   < > 4.1 4.3  CL 101   < > 105 105  CO2 24  --   --  23  GLUCOSE 76   < > 71 272*  BUN 53*   < > 50* 40*  CREATININE 2.31*   < > 2.50* 1.73*  CALCIUM 9.6  --   --  9.1   < > = values in this interval not displayed.   Lipid Panel:     Component Value Date/Time   CHOL 279 (H) 06/21/2019 1115   TRIG 359 (H) 06/21/2019 1115   HDL 38 (L) 06/21/2019 1115   CHOLHDL 7.3 06/21/2019 1115   VLDL 72 (H) 06/21/2019 1115   LDLCALC 169 (H) 06/21/2019 1115   HgbA1c:  Lab Results  Component Value Date   HGBA1C 9.1 (H) 06/21/2019   Urine Drug Screen: No results found for: LABOPIA, COCAINSCRNUR, LABBENZ, AMPHETMU, THCU, LABBARB  Alcohol Level No results found for: ETH  IMAGING past 24  hours MR ANGIO HEAD WO CONTRAST  Result Date: 06/21/2019 CLINICAL DATA:  Stroke follow-up EXAM: MRA HEAD WITHOUT CONTRAST TECHNIQUE: Angiographic images of the Circle of Willis were obtained using MRA technique without intravenous contrast. COMPARISON:  Brain MRI 06/21/2019 FINDINGS: POSTERIOR CIRCULATION: --Vertebral arteries: Normal V4 segments. --Inferior cerebellar arteries: Normal. --Basilar artery: Normal. --Superior cerebellar arteries: Normal. --Posterior cerebral arteries: Normal. ANTERIOR CIRCULATION: --Intracranial internal carotid arteries: Moderate-to-severe stenosis of the distal cavernous segment of the right ICA. --Anterior cerebral arteries (ACA): Normal. Both A1 segments are present. Patent anterior communicating artery (a-comm). --Middle cerebral arteries (MCA): Multifocal severe stenosis of the right MCA including the distal M1 segment and proximal M2. Normal left MCA. IMPRESSION: 1. No emergent large vessel occlusion. 2. Moderate-to-severe stenosis of the distal cavernous segment of the right ICA. 3. Multifocal severe stenosis of the right MCA including the distal M1 segment and proximal M2. Electronically Signed  By: Ulyses Jarred M.D.   On: 06/21/2019 22:37   US RENAL  Result Date: 06/21/2019 CLINICAL DATA:  Acute kidney failure. EXAM: RENAL / URINARY TRACT ULTRASOUND COMPLETE COMPARISON:  October 25, 2018. FINDINGS: Right Kidney: Renal measurements: 11.5 x 4.9 x 5.2 cm = volume: 154 mL . Echogenicity within normal limits. No mass or hydronephrosis visualized. Left Kidney: Renal measurements: 10.5 x 6.6 x 6.1 cm = volume: 220 mL. Prominent column of Bertin is again noted which is normal variant. Echogenicity within normal limits. No mass or hydronephrosis visualized. Bladder: Appears normal for degree of bladder distention. Other: None. IMPRESSION: No significant renal abnormality is noted. Electronically Signed   By: Marijo Conception M.D.   On: 06/21/2019 12:39   VAS US  CAROTID  Result Date: 06/21/2019 Carotid Arterial Duplex Study Indications:       CVA. Risk Factors:      Hypertension, hyperlipidemia, Diabetes. Comparison Study:  no prior Performing Technologist: Abram Sander RVS  Examination Guidelines: A complete evaluation includes B-mode imaging, spectral Doppler, color Doppler, and power Doppler as needed of all accessible portions of each vessel. Bilateral testing is considered an integral part of a complete examination. Limited examinations for reoccurring indications may be performed as noted.  Right Carotid Findings: +----------+--------+--------+--------+------------------+--------+           PSV cm/sEDV cm/sStenosisPlaque DescriptionComments +----------+--------+--------+--------+------------------+--------+ CCA Prox  54      12              heterogenous               +----------+--------+--------+--------+------------------+--------+ CCA Distal44      12              heterogenous               +----------+--------+--------+--------+------------------+--------+ ICA Prox  69      11      1-39%   heterogenous               +----------+--------+--------+--------+------------------+--------+ ICA Distal63      23                                         +----------+--------+--------+--------+------------------+--------+ ECA       65                                                 +----------+--------+--------+--------+------------------+--------+ +----------+--------+-------+--------+-------------------+           PSV cm/sEDV cmsDescribeArm Pressure (mmHG) +----------+--------+-------+--------+-------------------+ [id number]                                         +----------+--------+-------+--------+-------------------+ +---------+--------+--+--------+-+---------+ VertebralPSV cm/s25EDV cm/s6Antegrade +---------+--------+--+--------+-+---------+  Left Carotid Findings:  +----------+--------+--------+--------+------------------+--------+           PSV cm/sEDV cm/sStenosisPlaque DescriptionComments +----------+--------+--------+--------+------------------+--------+ CCA Prox  82      10              heterogenous               +----------+--------+--------+--------+------------------+--------+ CCA Distal59      13              heterogenous               +----------+--------+--------+--------+------------------+--------+  ICA Prox  39      13      1-39%   heterogenous               +----------+--------+--------+--------+------------------+--------+ ICA Distal54      13                                         +----------+--------+--------+--------+------------------+--------+ ECA       49      7                                          +----------+--------+--------+--------+------------------+--------+ +----------+--------+--------+--------+-------------------+           PSV cm/sEDV cm/sDescribeArm Pressure (mmHG) +----------+--------+--------+--------+-------------------+ [id number]                                          +----------+--------+--------+--------+-------------------+ +---------+--------+--+--------+--+---------+ VertebralPSV cm/s41EDV cm/s12Antegrade +---------+--------+--+--------+--+---------+   Summary: Right Carotid: Velocities in the right ICA are consistent with a 1-39% stenosis. Left Carotid: Velocities in the left ICA are consistent with a 1-39% stenosis. Vertebrals: Bilateral vertebral arteries demonstrate antegrade flow. *See table(s) above for measurements and observations.     Preliminary     PHYSICAL EXAM Obese middle-aged African-American lady not in distress. . Afebrile. Head is nontraumatic. Neck is supple without bruit.    Cardiac exam no murmur or gallop. Lungs are clear to auscultation. Distal pulses are well felt. Neurological Exam ;  Awake  Alert oriented x 3.  Mildly dysarthric  speech and language.eye movements full without nystagmus.fundi were not visualized. Vision acuity and fields appear normal. Hearing is normal. Palatal movements are normal. Face asymmetric with left lower facial droop.. Tongue midline. Normal strength, tone, reflexes and coordination except mild left lower extremity drift..  Diminished left face and body sensation. Gait deferred.  NIH stroke scale 4.  Premorbid modified Rankin score 0 ASSESSMENT/PLAN Briana Garrison is a 69 y.o. female with history of uncontrolled diabetes, hypertension, hypercholesterolemia presenting with 5-6d hx of L face, arm and leg decreased sensation.   Stroke:   R thalamic lacunar infarct secondary to small vessel disease source  MRI  R thalamic lacune. Small vessel disease. Multiple old lacunes.  MRA  No LVO. distal R ICA cavernous moderate to severe stenosis. R MCA distal M1, proximal M2 multifocal stenosis   Carotid Doppler  B ICA 1-39% stenosis, VAs antegrade   2D Echo pending yes  LDL 169  HgbA1c 9.1  Heparin 5000 units sq tid for VTE prophylaxis  aspirin 81 mg daily prior to admission, now on aspirin 325 mg daily and clopidogrel 75 mg daily. Decrease aspirin to 81 and Continue DAPT x 3 month then plavix alone.   Therapy recommendations:  HH PT, HH OT, R wrist splint for + Tinel's  Disposition:  Return home  Hypertension  Stable in the 180-190s . Permissive hypertension (OK if < 220/120) but gradually normalize in 5-7 days . Long-term BP goal normotensive  Hyperlipidemia  Home meds:  No statin  LDL 169, goal < 70  Added liipitor 80  Continue statin at discharge  Diabetes type II Uncontrolled  HgbA1c 9.1, goal < 7.0  Other Stroke Risk Factors  Advanced age  Obesity, Body mass index is 37.22 kg/m., recommend weight loss, diet and exercise as appropriate   Family hx stroke (mother, father)  Possible obstructive sleep apnea. Patient interested in participating in the Sleep Smart  trial. [facility] will follow up for possible enrollment. Please contact them at [phone] for any questions.    Other Active Problems  Normocytic anemia  Hx renal cell carcinoma   Hospital day # 1  She presented with 5 to 6-day history of left-sided numbness due to right thalamic infarct from small vessel disease.  Recommend continue ongoing stroke work-up and aggressive risk factor modification.  Aspirin and Plavix for 3 weeks followed by aspirin alone.  Patient also appears to be at risk for sleep apnea and may benefit with consideration for participation in the sleep smart study.  She was given information to review and decide.  Discussed with Dr. Einar Grad.  Greater than 50% time during this 35-minute visit was spent in counseling and coordination of care and discussion about stroke prevention and treatment and answering questions Antony Contras, MD To contact Stroke Continuity provider, please refer to [email or web address] After hours, contact General Neurology

## 2019-06-22 NOTE — Progress Notes (Signed)
Echocardiogram 2D Echocardiogram has been performed.  Oneal Deputy Rettie Laird 06/22/2019, 10:50 AM

## 2019-06-22 NOTE — Progress Notes (Signed)
PROGRESS NOTE    Briana Garrison  ZYS:[id number] DOB: 1950-10-12 DOA: 06/20/2019 PCP: [name], MD   Brief Narrative:   Briana Garrison is a 69 y.o. right-handed female with medical history significant of hypertension, hyperlipidemia, diabetes mellitus type 2, renal cell carcinoma s/p partial nephrectomy and GERD presented with complaints of 5 days of left-sided numbness. She reported having numbness sensation that radiated from her face on the left side all the way down to her feet.  Associated symptoms included complaints of a headache, some mild left sided weakness, numbness in the right hand, some slurred speech, intermittent difficulty swallowing liquids, nausea, and decreased memory reported to be repeating herself.   Upon admission into the emergency department patient was seen to be afebrile with stable hemodynamics.  Patient was noted to be out of the window for TPA.  MRI revealed acute lacunar infarct of the right thalamus.  Patient was admitted under hospital service and neurology consulted.  Assessment & Plan:   Principal Problem:   CVA (cerebral vascular accident) ([place]) Active Problems:   Acute kidney injury superimposed on CKD ([place])   Type 2 diabetes mellitus with hyperlipidemia ([place])   Essential hypertension   Hyperlipidemia   Normocytic anemia   History of renal cell carcinoma   CVA: Subacute, right thalamic, nonhemorrhagic.  MRI revealed acute lacunar infarct of the right thalamus.  MR angiogram of head without contrast shows no emergent large vessel occlusion.  Moderate to severe stenosis of distal cavernous segment of the right ICA.  Multifocal severe stenosis of the right MCA including the distal M1 segment and proximal M2 segment.  Patient seen by neurology and per my recommendation with Dr. Leonie Man, who recommend continuing DAPT for 3 weeks and no other intervention for stenosis as her stroke is not where her vessels are involved.  Doppler carotid negative for any  stenosis or occlusion.  PT OT on board SLP on board.  Case manager on board.  Appreciate neurology help and management per them.  Acute kidney injury superimposed on chronic kidney disease 3B: Baseline creatinine seems to be around 1.4 and 1.5 with GFR in low 40s.  Presented with creatinine of 2.31 and GFR 24.  This has improved but still elevated than her baseline.  Renal ultrasound negative.  Continue IV fluids.  Hold nephrotoxic agents.   Essential hypertension: Patient out of the window for allowing permissive hypertension.  Blood pressure was elevated this morning but improved now.  Continue home blood pressure medications which include Coreg 12.5 mg twice daily, clonidine 0.2 mg twice daily, and furosemide 20 mg twice daily (holding Lasix due to AKI).  Diabetes mellitus type 2 with neuropathy:-Patient presents with blood sugars low normal at 76.  At home patient had been working hard to get better control of her blood sugars.  Hemoglobin A1c 9.1.  Patient tells me that she uses 30 units of Lantus in the morning and 30 5 at night however per records here, she is on 40 units of Lantus at home.  Will increase Lantus to 30 units twice daily and placed on NovoLog Premeal 3 times daily 4 units and continue SSI.  Continue gabapentin.  Normocytic anemia: Stable.  Watch.  History of renal cell carcinoma: Patient status post partial nephrectomy.Renal ultrasound with no significant renal abnormality  Mixed hyperlipidemia: Significant elevated LDL and cholesterol and low HDL.  Increase atorvastatin to 80 mg.  DVT prophylaxis: Lovenox   Code Status: Full Code  Family Communication: Friend present at bedside.  Plan of care discussed with patient in length and she verbalized understanding and agreed with it. Patient is from: Home Disposition Plan: To be determined based on PT assessment Barriers to discharge: Further stroke work-up: PT assessment and OSA work-up per neurology.  Status is:  Inpatient  Remains inpatient appropriate because:Inpatient level of care appropriate due to severity of illness   Dispo: The patient is from: Home              Anticipated d/c is to: SNF              Anticipated d/c date is: 1 day              Patient currently is not medically stable to d/c.         Estimated body mass index is 37.22 kg/m as calculated from the following:   Height as of this encounter: 5\' 1"  (1.549 m).   Weight as of this encounter: 89.4 kg.      Nutritional status:               Consultants:   Neurology  Procedures:   None  Antimicrobials:  Anti-infectives (From admission, onward)   None         Subjective: Patient seen and examined.  Friend at the bedside.  She states that her weakness on the left side is improving.  She has obvious dysarthria but according to her, speech is getting better as well.  No new complaint.  Objective: Vitals:   06/22/19 0517 06/22/19 0911 06/22/19 1007 06/22/19 1226  BP: (!) 146/64 (!) 186/68 (!) 190/77 (!) 154/86  Pulse: 70 69 67 65  Resp: 16  16 16   Temp: 98.3 F (36.8 C)  (!) 97.4 F (36.3 C) 97.6 F (36.4 C)  TempSrc: Oral  Oral Oral  SpO2: 97%  100% 100%  Weight:      Height:        Intake/Output Summary (Last 24 hours) at 06/22/2019 1402 Last data filed at 06/22/2019 0300 Gross per 24 hour  Intake 381 ml  Output --  Net 381 ml   Filed Weights   06/20/19 1604 06/20/19 1605  Weight: 89.4 kg 89.4 kg    Examination:  General exam: Appears calm and comfortable  Respiratory system: Clear to auscultation. Respiratory effort normal. Cardiovascular system: S1 & S2 heard, RRR. No JVD, murmurs, rubs, gallops or clicks. No pedal edema. Gastrointestinal system: Abdomen is nondistended, soft and nontender. No organomegaly or masses felt. Normal bowel sounds heard. Central nervous system: Alert and oriented.  Expressive dysarthria.  Decreased sensation in the left lower extremity. .. Skin:  No rashes, lesions or ulcers Psychiatry: Judgement and insight appear normal. Mood & affect appropriate.    Data Reviewed: I have personally reviewed following labs and imaging studies  CBC: Recent Labs  Lab 06/20/19 1612 06/20/19 1642 06/22/19 0450  WBC 8.6  --  5.1  NEUTROABS 5.1  --   --   HGB 11.9* 11.9* 11.4*  HCT 37.5 35.0* 35.2*  MCV 89.3  --  88.7  PLT 319  --  478   Basic Metabolic Panel: Recent Labs  Lab 06/20/19 1612 06/20/19 1642 06/22/19 0450  NA 138 139 136  K 4.3 4.1 4.3  CL 101 105 105  CO2 24  --  23  GLUCOSE 76 71 272*  BUN 53* 50* 40*  CREATININE 2.31* 2.50* 1.73*  CALCIUM 9.6  --  9.1   GFR: Estimated Creatinine Clearance: 31.2  mL/min (A) (by C-G formula based on SCr of 1.73 mg/dL (H)). Liver Function Tests: Recent Labs  Lab 06/20/19 1612  AST 14*  ALT 16  ALKPHOS 83  BILITOT 0.5  PROT 7.2  ALBUMIN 3.5   No results for input(s): LIPASE, AMYLASE in the last 168 hours. No results for input(s): AMMONIA in the last 168 hours. Coagulation Profile: Recent Labs  Lab 06/20/19 1612  INR 1.0   Cardiac Enzymes: No results for input(s): CKTOTAL, CKMB, CKMBINDEX, TROPONINI in the last 168 hours. BNP (last 3 results) No results for input(s): PROBNP in the last 8760 hours. HbA1C: Recent Labs    06/21/19 1115  HGBA1C 9.1*   CBG: Recent Labs  Lab 06/21/19 1257 06/21/19 1908 06/21/19 2226 06/22/19 0619 06/22/19 1101  GLUCAP 165* 246* 293* 284* 261*   Lipid Profile: Recent Labs    06/21/19 1115  CHOL 279*  HDL 38*  LDLCALC 169*  TRIG 359*  CHOLHDL 7.3   Thyroid Function Tests: No results for input(s): TSH, T4TOTAL, FREET4, T3FREE, THYROIDAB in the last 72 hours. Anemia Panel: No results for input(s): VITAMINB12, FOLATE, FERRITIN, TIBC, IRON, RETICCTPCT in the last 72 hours. Sepsis Labs: No results for input(s): PROCALCITON, LATICACIDVEN in the last 168 hours.  Recent Results (from the past 240 hour(s))  SARS Coronavirus 2  by RT PCR (hospital order, performed in [facility] hospital lab) Nasopharyngeal Nasopharyngeal Swab     Status: None   Collection Time: 06/21/19  8:29 AM   Specimen: Nasopharyngeal Swab  Result Value Ref Range Status   SARS Coronavirus 2 NEGATIVE NEGATIVE Final    Comment: (NOTE) SARS-CoV-2 target nucleic acids are NOT DETECTED. The SARS-CoV-2 RNA is generally detectable in upper and lower respiratory specimens during the acute phase of infection. The lowest concentration of SARS-CoV-2 viral copies this assay can detect is 250 copies / mL. A negative result does not preclude SARS-CoV-2 infection and should not be used as the sole basis for treatment or other patient management decisions.  A negative result may occur with improper specimen collection / handling, submission of specimen other than nasopharyngeal swab, presence of viral mutation(s) within the areas targeted by this assay, and inadequate number of viral copies (<250 copies / mL). A negative result must be combined with clinical observations, patient history, and epidemiological information. Fact Sheet for Patients:   StrictlyIdeas.no Fact Sheet for Healthcare Providers: BankingDealers.co.za This test is not yet approved or cleared  by the [place] FDA and has been authorized for detection and/or diagnosis of SARS-CoV-2 by FDA under an Emergency Use Authorization (EUA).  This EUA will remain in effect (meaning this test can be used) for the duration of the COVID-19 declaration under Section 564(b)(1) of the Act, 21 U.S.C. section 360bbb-3(b)(1), unless the authorization is terminated or revoked sooner. Performed at [facility], [place] [address] [place], [place] [address]       Radiology Studies: DG Chest 2 View  Result Date: 06/21/2019 CLINICAL DATA:  Left-sided numbness.  Left leg weakness. EXAM: CHEST - 2 VIEW COMPARISON:  06/07/2017 FINDINGS: Cardiac  silhouette is normal in size. No mediastinal or hilar masses. No evidence of adenopathy. Clear lungs.  No pleural effusion or pneumothorax. Skeletal structures are intact. IMPRESSION: No active cardiopulmonary disease. Electronically Signed   By: Lajean Manes M.D.   On: 06/21/2019 10:08   MR ANGIO HEAD WO CONTRAST  Result Date: 06/21/2019 CLINICAL DATA:  Stroke follow-up EXAM: MRA HEAD WITHOUT CONTRAST TECHNIQUE: Angiographic images of  the Circle of Willis were obtained using MRA technique without intravenous contrast. COMPARISON:  Brain MRI 06/21/2019 FINDINGS: POSTERIOR CIRCULATION: --Vertebral arteries: Normal V4 segments. --Inferior cerebellar arteries: Normal. --Basilar artery: Normal. --Superior cerebellar arteries: Normal. --Posterior cerebral arteries: Normal. ANTERIOR CIRCULATION: --Intracranial internal carotid arteries: Moderate-to-severe stenosis of the distal cavernous segment of the right ICA. --Anterior cerebral arteries (ACA): Normal. Both A1 segments are present. Patent anterior communicating artery (a-comm). --Middle cerebral arteries (MCA): Multifocal severe stenosis of the right MCA including the distal M1 segment and proximal M2. Normal left MCA. IMPRESSION: 1. No emergent large vessel occlusion. 2. Moderate-to-severe stenosis of the distal cavernous segment of the right ICA. 3. Multifocal severe stenosis of the right MCA including the distal M1 segment and proximal M2. Electronically Signed   By: Ulyses Jarred M.D.   On: 06/21/2019 22:37   MR BRAIN WO CONTRAST  Result Date: 06/21/2019 CLINICAL DATA:  Left-sided numbness and weakness EXAM: MRI HEAD WITHOUT CONTRAST TECHNIQUE: Multiplanar, multiecho pulse sequences of the brain and surrounding structures were obtained without intravenous contrast. COMPARISON:  None. FINDINGS: Brain: Ovoid acute infarct in the right thalamus measuring 1 cm. Remote bilateral thalamic and deep gray matter infarcts. Remote infarct in the pons with wallerian  changes crossing the pons and extending into the middle cerebellar peduncles. Post ischemic wallerian changes also across the anterior corpus callosum body. No acute hemorrhage, hydrocephalus, collection, or masslike finding. Vascular: Normal flow voids. Skull and upper cervical spine: Normal marrow signal Sinuses/Orbits: Retention cysts in the maxillary and left sphenoid sinuses with mucosal thickening. Bilateral cataract resection. Partial right mastoid opacification with negative nasopharynx. IMPRESSION: 1. Acute lacunar infarct in the right thalamus. 2. Chronic small vessel ischemia including multiple remote lacunar infarcts. Electronically Signed   By: Monte Fantasia M.D.   On: 06/21/2019 07:34   US RENAL  Result Date: 06/21/2019 CLINICAL DATA:  Acute kidney failure. EXAM: RENAL / URINARY TRACT ULTRASOUND COMPLETE COMPARISON:  October 25, 2018. FINDINGS: Right Kidney: Renal measurements: 11.5 x 4.9 x 5.2 cm = volume: 154 mL . Echogenicity within normal limits. No mass or hydronephrosis visualized. Left Kidney: Renal measurements: 10.5 x 6.6 x 6.1 cm = volume: 220 mL. Prominent column of Bertin is again noted which is normal variant. Echogenicity within normal limits. No mass or hydronephrosis visualized. Bladder: Appears normal for degree of bladder distention. Other: None. IMPRESSION: No significant renal abnormality is noted. Electronically Signed   By: Marijo Conception M.D.   On: 06/21/2019 12:39   ECHOCARDIOGRAM COMPLETE  Result Date: 06/22/2019    ECHOCARDIOGRAM REPORT   Patient Name:   ZAMIRAH DENNY Date of Exam: 06/22/2019 Medical Rec #:  [id number]       Height:       61.0 in Accession #:    [id number]      Weight:       197.0 lb Date of Birth:  1950/05/17       BSA:          1.877 m Patient Age:    2 years        BP:           190/77 mmHg Patient Gender: F               HR:           64 bpm. Exam Location:  Inpatient Procedure: 2D Echo, Color Doppler and Cardiac Doppler Indications:     Stroke i163.9  History:  Patient has prior history of Echocardiogram examinations, most                 recent 10/21/2018. Risk Factors:Hypertension, Diabetes and                 Dyslipidemia.  Sonographer:    Raquel Sarna Senior RDCS Referring Phys: [phone] [place]  1. Left ventricular ejection fraction, by estimation, is 60 to 65%. The left ventricle has normal function. The left ventricle has no regional wall motion abnormalities. There is severe concentric left ventricular hypertrophy. Left ventricular diastolic  parameters are consistent with Grade II diastolic dysfunction (pseudonormalization). Elevated left atrial pressure.  2. Right ventricular systolic function is normal. The right ventricular size is normal. Tricuspid regurgitation signal is inadequate for assessing PA pressure.  3. The mitral valve is grossly normal. No evidence of mitral valve regurgitation. No evidence of mitral stenosis.  4. The aortic valve is tricuspid. Aortic valve regurgitation is not visualized. Mild aortic valve sclerosis is present, with no evidence of aortic valve stenosis.  5. The inferior vena cava is normal in size with <50% respiratory variability, suggesting right atrial pressure of 8 mmHg. Comparison(s): No significant change from prior study. Conclusion(s)/Recommendation(s): No intracardiac source of embolism detected on this transthoracic study. A transesophageal echocardiogram is recommended to exclude cardiac source of embolism if clinically indicated. FINDINGS  Left Ventricle: Left ventricular ejection fraction, by estimation, is 60 to 65%. The left ventricle has normal function. The left ventricle has no regional wall motion abnormalities. The left ventricular internal cavity size was normal in size. There is  severe concentric left ventricular hypertrophy. Left ventricular diastolic parameters are consistent with Grade II diastolic dysfunction (pseudonormalization). Elevated left atrial pressure.  Right Ventricle: The right ventricular size is normal. No increase in right ventricular wall thickness. Right ventricular systolic function is normal. Tricuspid regurgitation signal is inadequate for assessing PA pressure. Left Atrium: Left atrial size was normal in size. Right Atrium: Right atrial size was normal in size. Pericardium: Trivial pericardial effusion is present. The pericardial effusion is circumferential. Presence of pericardial fat pad. Mitral Valve: The mitral valve is grossly normal. No evidence of mitral valve regurgitation. No evidence of mitral valve stenosis. Tricuspid Valve: The tricuspid valve is grossly normal. Tricuspid valve regurgitation is not demonstrated. No evidence of tricuspid stenosis. Aortic Valve: The aortic valve is tricuspid. Aortic valve regurgitation is not visualized. Mild aortic valve sclerosis is present, with no evidence of aortic valve stenosis. Pulmonic Valve: The pulmonic valve was grossly normal. Pulmonic valve regurgitation is not visualized. No evidence of pulmonic stenosis. Aorta: The aortic root and ascending aorta are structurally normal, with no evidence of dilitation. Venous: The inferior vena cava is normal in size with less than 50% respiratory variability, suggesting right atrial pressure of 8 mmHg. IAS/Shunts: The atrial septum is grossly normal.  LEFT VENTRICLE PLAX 2D LVIDd:         3.20 cm  Diastology LVIDs:         2.00 cm  LV e' lateral:   3.81 cm/s LV PW:         1.70 cm  LV E/e' lateral: 26.5 LV IVS:        1.70 cm  LV e' medial:    4.68 cm/s LVOT diam:     1.80 cm  LV E/e' medial:  21.6 LV SV:         48 LV SV Index:   25 LVOT Area:  2.54 cm  RIGHT VENTRICLE RV S prime:     10.70 cm/s TAPSE (M-mode): 1.8 cm LEFT ATRIUM           Index       RIGHT ATRIUM          Index LA diam:      3.30 cm 1.76 cm/m  RA Area:     9.83 cm LA Vol (A2C): 40.5 ml 21.58 ml/m RA Volume:   17.60 ml 9.38 ml/m LA Vol (A4C): 46.1 ml 24.56 ml/m  AORTIC VALVE LVOT  Vmax:   80.30 cm/s LVOT Vmean:  58.500 cm/s LVOT VTI:    0.187 m  AORTA Ao Root diam: 2.90 cm Ao Asc diam:  3.00 cm MITRAL VALVE MV Area (PHT): 2.39 cm     SHUNTS MV Decel Time: 317 msec     Systemic VTI:  0.19 m MV E velocity: 101.00 cm/s  Systemic Diam: 1.80 cm MV A velocity: 142.00 cm/s MV E/A ratio:  0.71 Eleonore Chiquito MD Electronically signed by Eleonore Chiquito MD Signature Date/Time: 06/22/2019/2:01:46 PM    Final    VAS US CAROTID  Result Date: 06/21/2019 Carotid Arterial Duplex Study Indications:       CVA. Risk Factors:      Hypertension, hyperlipidemia, Diabetes. Comparison Study:  no prior Performing Technologist: Abram Sander RVS  Examination Guidelines: A complete evaluation includes B-mode imaging, spectral Doppler, color Doppler, and power Doppler as needed of all accessible portions of each vessel. Bilateral testing is considered an integral part of a complete examination. Limited examinations for reoccurring indications may be performed as noted.  Right Carotid Findings: +----------+--------+--------+--------+------------------+--------+           PSV cm/sEDV cm/sStenosisPlaque DescriptionComments +----------+--------+--------+--------+------------------+--------+ CCA Prox  54      12              heterogenous               +----------+--------+--------+--------+------------------+--------+ CCA Distal44      12              heterogenous               +----------+--------+--------+--------+------------------+--------+ ICA Prox  69      11      1-39%   heterogenous               +----------+--------+--------+--------+------------------+--------+ ICA Distal63      23                                         +----------+--------+--------+--------+------------------+--------+ ECA       65                                                 +----------+--------+--------+--------+------------------+--------+ +----------+--------+-------+--------+-------------------+            PSV cm/sEDV cmsDescribeArm Pressure (mmHG) +----------+--------+-------+--------+-------------------+ [id number]                                         +----------+--------+-------+--------+-------------------+ +---------+--------+--+--------+-+---------+ VertebralPSV cm/s25EDV cm/s6Antegrade +---------+--------+--+--------+-+---------+  Left Carotid Findings: +----------+--------+--------+--------+------------------+--------+           PSV cm/sEDV cm/sStenosisPlaque DescriptionComments +----------+--------+--------+--------+------------------+--------+ CCA Prox  82      10              heterogenous               +----------+--------+--------+--------+------------------+--------+ CCA Distal59      13              heterogenous               +----------+--------+--------+--------+------------------+--------+ ICA Prox  39      13      1-39%   heterogenous               +----------+--------+--------+--------+------------------+--------+ ICA Distal54      13                                         +----------+--------+--------+--------+------------------+--------+ ECA       49      7                                          +----------+--------+--------+--------+------------------+--------+ +----------+--------+--------+--------+-------------------+           PSV cm/sEDV cm/sDescribeArm Pressure (mmHG) +----------+--------+--------+--------+-------------------+ [id number]                                          +----------+--------+--------+--------+-------------------+ +---------+--------+--+--------+--+---------+ VertebralPSV cm/s41EDV cm/s12Antegrade +---------+--------+--+--------+--+---------+   Summary: Right Carotid: Velocities in the right ICA are consistent with a 1-39% stenosis. Left Carotid: Velocities in the left ICA are consistent with a 1-39% stenosis. Vertebrals: Bilateral vertebral arteries demonstrate antegrade  flow. *See table(s) above for measurements and observations.     Preliminary     Scheduled Meds: .  stroke: mapping our early stages of recovery book   Does not apply Once  . [START ON 06/23/2019] aspirin EC  81 mg Oral Daily  . atorvastatin  80 mg Oral Daily  . carvedilol  12.5 mg Oral BID WC  . cloNIDine  0.2 mg Oral BID  . clopidogrel  75 mg Oral Daily  . fluticasone  1 spray Each Nare Daily  . gabapentin  300 mg Oral QHS  . heparin  5,000 Units Subcutaneous Q8H  . insulin aspart  0-9 Units Subcutaneous TID WC  . sodium chloride flush  3 mL Intravenous Once   Continuous Infusions: . sodium chloride 75 mL/hr at 06/22/19 1114     LOS: 1 day   Time spent: 38 minutes   Darliss Cheney, MD [facility]  06/22/2019, 2:02 PM   To contact the attending provider between 7A-7P or the covering provider during after hours 7P-7A, please log into the web site [email or web address].

## 2019-06-22 NOTE — Progress Notes (Signed)
Inpatient Diabetes Program Recommendations  AACE/ADA: New Consensus Statement on Inpatient Glycemic Control (2015)  Target Ranges:  Prepandial:   less than 140 mg/dL      Peak postprandial:   less than 180 mg/dL (1-2 hours)      Critically ill patients:  140 - 180 mg/dL   Lab Results  Component Value Date   GLUCAP 284 (H) 06/22/2019   HGBA1C 9.1 (H) 06/21/2019    Review of Glycemic Control Results for Briana Garrison, Briana Garrison (MRN [id number]) as of 06/22/2019 09:31  Ref. Range 06/20/2019 19:46 06/21/2019 12:57 06/21/2019 19:08  Glucose-Capillary Latest Ref Range: 70 - 99 mg/dL 97 165 (H) 246 (H)   Diabetes history: DM 2 Outpatient Diabetes medications: Lantus 30 units q AM and Lantus 35 units q PM Humalog 10-22 units tid with meals Current orders for Inpatient glycemic control:  Novolog sensitive tid with meals  Inpatient Diabetes Program Recommendations:    Please consider adding Lantus 30 units daily and Novolog 5 units tid with meals.   Thanks,  Adah Perl, RN, BC-ADM Inpatient Diabetes Coordinator Pager [phone] (8a-5p)

## 2019-06-22 NOTE — TOC Initial Note (Signed)
Transition of Care [facility]) - Initial/Assessment Note    Patient Details  Name: Briana Garrison MRN: [id number] Date of Birth: [birth year]  Transition of Care [facility]) CM/SW Contact:    Alexander Mt, LCSW Phone Number: 06/22/2019, 4:32 PM  Clinical Narrative:                 CSW spoke with pt via room phone. Introduced self, role, reason for call. Pt confirmed home address, phone number and PCP. She has a spouse and daughter that support her at home. She has a rolling walker and is interested in [facility] but has never personally had it. She also is interested in a bedside commode but wants to see what it costs with her insurance before committing. CSW explained we would bring by information about which [facility] agencies cover [place] to discuss with pt tomorrow. She is agreeable.   Expected Discharge Plan: [place] Barriers to Discharge: Continued Medical Work up   Patient Goals and CMS Choice Patient states their goals for this hospitalization and ongoing recovery are:: get back home when she can CMS Medicare.gov Compare Post Acute Care list provided to:: Patient Choice offered to / list presented to : Patient  Expected Discharge Plan and Services Expected Discharge Plan: [place] In-house Referral: Clinical Social Work Discharge Planning Services: CM Consult Post Acute Care Choice: [place] arrangements for the past 2 months: Single Family Home             DME Arranged: 3-N-1 DME Agency: [facility] HH Arranged: PT, OT  Prior Living Arrangements/Services Living arrangements for the past 2 months: Single Family Home Lives with:: Spouse Patient language and need for interpreter reviewed:: Yes(no needs) Do you feel safe going back to the place where you live?: Yes      Need for Family Participation in Patient Care: Yes (Comment)(assistance with daily cares as needed) Care giver support system in place?: Yes (comment)(spouse/daughter) Current home  services: DME Criminal Activity/Legal Involvement Pertinent to Current Situation/Hospitalization: No - Comment as needed  Activities of Daily Living Home Assistive Devices/Equipment: Walker (specify type) ADL Screening (condition at time of admission) Patient's cognitive ability adequate to safely complete daily activities?: Yes Is the patient deaf or have difficulty hearing?: Yes Does the patient have difficulty seeing, even when wearing glasses/contacts?: No Does the patient have difficulty concentrating, remembering, or making decisions?: No Patient able to express need for assistance with ADLs?: Yes Does the patient have difficulty dressing or bathing?: No Independently performs ADLs?: Yes (appropriate for developmental age) Does the patient have difficulty walking or climbing stairs?: Yes Weakness of Legs: Both Weakness of Arms/Hands: Right  Permission Sought/Granted Permission sought to share information with : Family Supports Permission granted to share information with : Yes, Verbal Permission Granted  Share Information with NAME: Samayra Hebel     Permission granted to share info w Relationship: spouse  Permission granted to share info w Contact Information: [phone]  Emotional Assessment Appearance:: Other (Comment Required(telephonic assessment) Attitude/Demeanor/Rapport: Engaged(telephonic assessment) Affect (typically observed): Accepting, Adaptable(telephonic assessment) Orientation: : Oriented to Self, Oriented to Place, Oriented to  Time, Oriented to Situation Alcohol / Substance Use: Not Applicable Psych Involvement: (n/a)  Admission diagnosis:  Gait disturbance [R26.9] CVA (cerebral vascular accident) ([place]) [I63.9] Left-sided weakness [R53.1] Acute CVA (cerebrovascular accident) ([place]) [I63.9] History of renal cell cancer [Z85.528] Acute kidney injury superimposed on chronic kidney disease ([place]) [N17.9, N18.9] Patient Active Problem List   Diagnosis Date  Noted  . Acute CVA (cerebrovascular accident) ([place]) 06/21/2019  . History of renal cell carcinoma 06/21/2019  . Left ventricular hypertrophy 11/26/2018  . CKD stage G3b/A3, GFR 30-44 and albumin creatinine ratio >300 mg/g 11/26/2018  . Change in stool 08/03/2018  . Choledocholithiasis with obstruction 02/07/2017  . AKI (acute kidney injury) ([place]) 02/07/2017  . Acute kidney injury superimposed on CKD ([place]) 06/23/2011  . Hypoglycemia associated with diabetes ([place]) 06/23/2011  . Type 2 diabetes mellitus with hyperlipidemia ([place]) 06/23/2011  . Essential hypertension 06/23/2011  . Hyperlipidemia 06/23/2011  . Normocytic anemia 06/23/2011  . UTI (lower urinary tract infection) 06/23/2011   PCP:  [name], MD Pharmacy:   [facility] [address] [place] [address] [place] [address] Phone: [phone] Fax: [phone]  [place], [place] [address] [place] [address] [place] [address] Phone: [phone] Fax: [phone]   Readmission Risk Interventions Readmission Risk Prevention Plan 06/22/2019  Transportation Screening Complete  PCP or Specialist Appt within 5-7 Days Complete  Home Care Screening Complete  Medication Review (RN CM) Referral to Pharmacy  Some recent data might be hidden

## 2019-06-22 NOTE — Progress Notes (Signed)
Occupational therapy Evaluation  PTA, pt lived at home with her husband and was independent with ADL, IADL tasks and mobility. Pt currently requires increased assistance with ADL and mobility using RW due to deficits listed below.  Pt also complaining of tingling/numbness in R index, middle and thumb. +Tinel's sign. Recommend pt wear a wrist cock-up splint at night only to reduce symptoms consistent with Carpal Tunnel Syndrome. Recommend follow up with [place] and initial 24/7 S. Pt states family will be able to provide assistance as needed after DC. Will follow acutely to facilitate safe DC home.     06/22/19 0900  OT Visit Information  Last OT Received On 06/22/19  Assistance Needed +1  History of Present Illness 69 y.o. female with history of uncontrolled diabetes, hypertension, hypercholesterolemia . Onset of left face arm and leg decreased sensation.  Patient thought it would go away thus did not come to the hospital. MRI + R thalamic CVA. Also has complaints of tingling/numbness in R middle, index and thumb.   Precautions  Precautions Fall  Required Braces or Orthoses  (needs R wrist cock up splint)  Home Living  Family/patient expects to be discharged to: Private residence  Living Arrangements Spouse/significant other  Available Help at Discharge Family;Available 24 hours/day  Type of Home House  Home Access Stairs to enter  Entrance Stairs-Number of Steps 2  Entrance Stairs-Rails None  Home Layout One level  Bathroom Shower/Tub Tub/shower unit;Door;Curtain  Corporate treasurer Yes  How Accessible Accessible via walker  [place] - single point;Walker - 4 wheels  Prior Function  Level of Independence Independent;Independent with assistive device(s)  Comments Since "being sick", has had to have assistance getting off commode and has been using cane; drove, did her own Information systems manager; retired from school system "support personell"   Communication  Communication Other (comment);Expressive difficulties (slurred speech)  Pain Assessment  Pain Assessment No/denies pain  Cognition  Arousal/Alertness Awake/alert  Behavior During Therapy WFL for tasks assessed/performed  Overall Cognitive Status Impaired/Different from baseline  Area of Impairment Attention;Memory  Current Attention Level Selective  Memory Decreased short-term memory (immiediate recall 3/3; delayed recall 2/3)  General Comments Family states she has been "repeating herself" more than usual  Upper Extremity Assessment  Upper Extremity Assessment RUE deficits/detail;LUE deficits/detail  RUE Deficits / Details AROM and strength WFL; hx of RTC impairment; + Tinel's sign  with symptoms consistent with CTS  LUE Deficits / Details AROM and strength WFL; appaent sensory motor deficits but using LUE functionally; more difficulty with in-hand manipulation skills  LUE Sensation decreased light touch  LUE Coordination decreased fine motor  Lower Extremity Assessment  Lower Extremity Assessment Defer to PT evaluation  Cervical / Trunk Assessment  Cervical / Trunk Assessment Normal  ADL  Overall ADL's  Needs assistance/impaired  Eating/Feeding Set up  Grooming Set up  Upper Body Bathing Set up;Sitting  Lower Body Bathing Min guard;Sit to/from stand  Upper Body Dressing  Set up;Sitting  Lower Body Dressing Min guard;Sit to/from Retail buyer Ambulation;RW;Min guard  Toileting- Music therapist guard;Sit to/from stand;Sitting/lateral lean  Functional mobility during ADLs Min guard;Rolling walker;Cueing for safety;Cueing for sequencing  Vision- History  Baseline Vision/History Wears glasses  Wears Glasses Reading only  Patient Visual Report Blurring of vision  Vision- Assessment  Vision Assessment? Yes  Eye Alignment WFL  Ocular Range of Motion [facility]  Alignment/Gaze Preference WDL  Tracking/Visual Pursuits Able to track stimulus  in all  quads without difficulty  Saccades WFL  Convergence WFL  Visual Fields No apparent deficits  Additional Comments Pt reports vision is  "a little blurry, but getting better"  Praxis  Praxis tested? WFL  Bed Mobility  Overal bed mobility Needs Assistance  Bed Mobility Supine to Sit  Supine to sit Min guard  General bed mobility comments Heavy use of bed rails  Transfers  Overall transfer level Needs assistance  Equipment used Rolling walker (2 wheeled)  Transfers Sit to/from Stand;Stand Pivot Transfers  Sit to Stand Min guard  Stand pivot transfers Min guard  Balance  Overall balance assessment Needs assistance  Sitting balance-Leahy Scale Good  Standing balance-Leahy Scale Fair  Standing balance comment able to release RW to complete ADL tasks; unsteady  OT - End of Session  Equipment Utilized During Treatment Gait belt;Rolling walker  Activity Tolerance Patient tolerated treatment well  Patient left in bed;with call bell/phone within reach;Other (comment) (ECHO to turn bed alarm on )  Nurse Communication Mobility status;Other (comment) (DC needs)  OT Assessment  OT Recommendation/Assessment Patient needs continued OT Services  OT Visit Diagnosis Unsteadiness on feet (R26.81);Other abnormalities of gait and mobility (R26.89);Other symptoms and signs involving cognitive function;Pain  Pain - Right/Left Right  Pain - part of body Ankle and joints of foot  OT Problem List Decreased activity tolerance;Impaired balance (sitting and/or standing);Impaired vision/perception;Decreased coordination;Decreased cognition;Decreased safety awareness;Decreased knowledge of use of DME or AE;Impaired sensation;Obesity;Pain;Impaired UE functional use  OT Plan  OT Frequency (ACUTE ONLY) Min 2X/week  OT Treatment/Interventions (ACUTE ONLY) Self-care/ADL training;Therapeutic exercise;Neuromuscular education;Energy conservation;DME and/or AE instruction;Therapeutic activities;Cognitive  remediation/compensation;Visual/perceptual remediation/compensation;Patient/family education;Balance training  AM-PAC OT "6 Clicks" Daily Activity Outcome Measure (Version 2)  Help from another person eating meals? 3  Help from another person taking care of personal grooming? 3  Help from another person toileting, which includes using toliet, bedpan, or urinal? 3  Help from another person bathing (including washing, rinsing, drying)? 3  Help from another person to put on and taking off regular upper body clothing? 3  Help from another person to put on and taking off regular lower body clothing? 3  6 Click Score 18  OT Recommendation  Follow Up Recommendations Home health OT;Supervision/Assistance - 24 hour  OT Equipment 3 in 1 bedside commode;Other (comment) (RW)  Individuals Consulted  Consulted and Agree with Results and Recommendations Patient;Family member/caregiver  Family Member Consulted friend "Bennie"  Acute Rehab OT Goals  Patient Stated Goal to get better  OT Goal Formulation With patient  Time For Goal Achievement 07/06/19  Potential to Achieve Goals Good  OT Time Calculation  OT Start Time (ACUTE ONLY) 0920  OT Stop Time (ACUTE ONLY) 0950  OT Time Calculation (min) 30 min  OT General Charges  $OT Visit 1 Visit  OT Evaluation  $OT Eval Moderate Complexity 1 Mod  OT Treatments  $Self Care/Home Management  8-22 mins  Written Expression  Dominant Hand Right  Maurie Boettcher, OT/L   Acute OT Clinical Specialist Acute Rehabilitation Services Pager [phone] Office [phone]

## 2019-06-22 NOTE — Progress Notes (Signed)
Orthopedic Tech Progress Note Patient Details:  Briana Garrison 1950-12-08 [id number]  Ortho Devices Type of Ortho Device: Wrist splint Ortho Device/Splint Location: Right Wrist Ortho Device/Splint Interventions: Application   Post Interventions Patient Tolerated: Well Instructions Provided: Adjustment of device   Briana Garrison E Onix Jumper 06/22/2019, 12:11 PM

## 2019-06-22 NOTE — Evaluation (Signed)
Physical Therapy Evaluation Patient Details Name: Briana Garrison MRN: [id number] DOB: [birth year] Today's Date: 06/22/2019   History of Present Illness  69 y.o. female with history of uncontrolled diabetes, hypertension, hypercholesterolemia . Onset of left face arm and leg decreased sensation.  Patient thought it would go away thus did not come to the hospital. MRI + R thalamic CVA. Also has complaints of tingling/numbness in R middle, index and thumb.   Clinical Impression  Pt admitted with CVA.  She presents with decreased balance, mobility, and safety.  Pt with good strength in L LE but decreased sensation and coordination.  Pt ambulated 125' with RW but with min A due to multiple balance losses.  Pt has good family support and good rehab potential.  Pt currently with functional limitations due to the deficits listed below (see PT Problem List). Pt will benefit from skilled PT to increase their independence and safety with mobility to allow discharge to the venue listed below.       Follow Up Recommendations Home health PT;Supervision/Assistance - 24 hour    Equipment Recommendations  Rolling walker with 5" wheels    Recommendations for Other Services       Precautions / Restrictions Precautions Precautions: Fall      Mobility  Bed Mobility Overal bed mobility: Needs Assistance Bed Mobility: Supine to Sit     Supine to sit: Min guard     General bed mobility comments: Heavy use of bed rails and increased time  Transfers Overall transfer level: Needs assistance Equipment used: Rolling walker (2 wheeled) Transfers: Sit to/from [facility] Sit to Stand: Min guard Stand pivot transfers: Min guard       General transfer comment: sit to stand x 2 (from bed and toielt) with min guard for steadying; toielting ADLs with min guard for safety  Ambulation/Gait Ambulation/Gait assistance: Min assist Gait Distance (Feet): 125 Feet Assistive device: None;Rolling  walker (2 wheeled) Gait Pattern/deviations: Decreased stride length;Shuffle;Staggering left Gait velocity: decreased   General Gait Details: Pt took a few steps in room without RW but was very unsteady.  Ambulated in hall with RW.  Pt had 4 LOB toward L and recovered with min A.  Cues for safety, increased step length, and RW management.  Educated pt and friend on guarding on L side as she loses balance in that direction.  Stairs            Wheelchair Mobility    Modified Rankin (Stroke Patients Only) Modified Rankin (Stroke Patients Only) Pre-Morbid Rankin Score: No symptoms Modified Rankin: Moderately severe disability     Balance Overall balance assessment: Needs assistance   Sitting balance-Leahy Scale: Good     Standing balance support: During functional activity;Single extremity supported Standing balance-Leahy Scale: Fair Standing balance comment: required UE support or leaning at sink to maintain balance during toielting ADLs and washing hands               High Level Balance Comments: Pt unable to perform head turns  without LOB.  Required assist to steer walker around items as she tended to drift L.  Required min A/guarding during turns due to LOB.             Pertinent Vitals/Pain Pain Assessment: No/denies pain    Home Living Family/patient expects to be discharged to:: Private residence Living Arrangements: Spouse/significant other;Children(and daughter) Available Help at Discharge: Family;Available 24 hours/day Type of Home: House Home Access: Stairs to enter Entrance Stairs-Rails: None Entrance  Stairs-Number of Steps: 2 Home Layout: One level Home Equipment: Cane - single point;Walker - 4 wheels      Prior Function Level of Independence: Independent         Comments: Prior to last few days of "being sick" she was completely independent with ADLs, IADLs, driving, community ambulation.  Did not use AD.     Hand Dominance         Extremity/Trunk Assessment   Upper Extremity Assessment Upper Extremity Assessment: Defer to OT evaluation    Lower Extremity Assessment Lower Extremity Assessment: LLE deficits/detail;RLE deficits/detail RLE Deficits / Details: ROM WFL; MMT 5/5 LLE Deficits / Details: ROM WFL; MMT 5/5 LLE Sensation: decreased light touch(able to identify light touch but reports feels numb) LLE Coordination: decreased gross motor;decreased fine motor(heel to shin decreased)    Cervical / Trunk Assessment Cervical / Trunk Assessment: Normal  Communication   Communication: Other (comment);Expressive difficulties(slurred speech)  Cognition Arousal/Alertness: Awake/alert Behavior During Therapy: WFL for tasks assessed/performed   Area of Impairment: Orientation                 Orientation Level: Time(able to state day of week and year)             General Comments: Family states she has been "repeating herself" more than usual      General Comments General comments (skin integrity, edema, etc.): EOEM and peripheral vision intact but pt reports new blurry vision.  When tested only blurry L eye.    Exercises     Assessment/Plan    PT Assessment Patient needs continued PT services  PT Problem List Decreased strength;Decreased mobility;Decreased safety awareness;Decreased coordination;Decreased knowledge of precautions;Decreased activity tolerance;Decreased balance;Decreased knowledge of use of DME       PT Treatment Interventions DME instruction;Therapeutic activities;Gait training;Therapeutic exercise;Patient/family education;Stair training;Balance training;Functional mobility training;Neuromuscular re-education    PT Goals (Current goals can be found in the Care Plan section)  Acute Rehab PT Goals Patient Stated Goal: to get better PT Goal Formulation: With patient Time For Goal Achievement: 07/07/19 Potential to Achieve Goals: Good    Frequency Min 4X/week   Barriers to  discharge        Co-evaluation               AM-PAC PT "6 Clicks" Mobility  Outcome Measure Help needed turning from your back to your side while in a flat bed without using bedrails?: None Help needed moving from lying on your back to sitting on the side of a flat bed without using bedrails?: None Help needed moving to and from a bed to a chair (including a wheelchair)?: A Little Help needed standing up from a chair using your arms (e.g., wheelchair or bedside chair)?: A Little Help needed to walk in hospital room?: A Little Help needed climbing 3-5 steps with a railing? : A Little 6 Click Score: 20    End of Session Equipment Utilized During Treatment: Gait belt Activity Tolerance: Patient tolerated treatment well Patient left: with chair alarm set;in chair;with call bell/phone within reach Nurse Communication: Mobility status PT Visit Diagnosis: Unsteadiness on feet (R26.81)    Time: 0175-1025 PT Time Calculation (min) (ACUTE ONLY): 32 min   Charges:   PT Evaluation $PT Eval Moderate Complexity: 1 Mod PT Treatments $Gait Training: 8-22 mins        Maggie Font, PT Acute Rehab Services Pager [phone] [facility] [phone] [facility] [place] 06/22/2019, 2:35 PM

## 2019-06-23 LAB — BASIC METABOLIC PANEL
Anion gap: 8 (ref 5–15)
BUN: 33 mg/dL — ABNORMAL HIGH (ref 8–23)
CO2: 22 mmol/L (ref 22–32)
Calcium: 9.6 mg/dL (ref 8.9–10.3)
Chloride: 107 mmol/L (ref 98–111)
Creatinine, Ser: 1.78 mg/dL — ABNORMAL HIGH (ref 0.44–1.00)
GFR calc Af Amer: 33 mL/min — ABNORMAL LOW (ref >60)
GFR calc non Af Amer: 29 mL/min — ABNORMAL LOW (ref >60)
Glucose, Bld: 161 mg/dL — ABNORMAL HIGH (ref 70–99)
Potassium: 4 mmol/L (ref 3.5–5.1)
Sodium: 137 mmol/L (ref 135–145)

## 2019-06-23 LAB — CBC WITH DIFFERENTIAL/PLATELET
Abs Immature Granulocytes: 0.03 10*3/uL (ref 0.00–0.07)
Basophils Absolute: 0 10*3/uL (ref 0.0–0.1)
Basophils Relative: 1 %
Eosinophils Absolute: 0.2 10*3/uL (ref 0.0–0.5)
Eosinophils Relative: 4 %
HCT: 34.6 % — ABNORMAL LOW (ref 36.0–46.0)
Hemoglobin: 11.2 g/dL — ABNORMAL LOW (ref 12.0–15.0)
Immature Granulocytes: 1 %
Lymphocytes Relative: 32 %
Lymphs Abs: 1.8 10*3/uL (ref 0.7–4.0)
MCH: 28.3 pg (ref 26.0–34.0)
MCHC: 32.4 g/dL (ref 30.0–36.0)
MCV: 87.4 fL (ref 80.0–100.0)
Monocytes Absolute: 0.6 10*3/uL (ref 0.1–1.0)
Monocytes Relative: 10 %
Neutro Abs: 3.1 10*3/uL (ref 1.7–7.7)
Neutrophils Relative %: 52 %
Platelets: 292 10*3/uL (ref 150–400)
RBC: 3.96 MIL/uL (ref 3.87–5.11)
RDW: 13.4 % (ref 11.5–15.5)
WBC: 5.8 10*3/uL (ref 4.0–10.5)
nRBC: 0 % (ref 0.0–0.2)

## 2019-06-23 LAB — GLUCOSE, CAPILLARY
Glucose-Capillary: 207 mg/dL — ABNORMAL HIGH (ref 70–99)
Glucose-Capillary: 147 mg/dL — ABNORMAL HIGH (ref 70–99)
Glucose-Capillary: 209 mg/dL — ABNORMAL HIGH (ref 70–99)
Glucose-Capillary: 125 mg/dL — ABNORMAL HIGH (ref 70–99)
Glucose-Capillary: 82 mg/dL (ref 70–99)

## 2019-06-23 MED ORDER — HYDRALAZINE HCL 50 MG PO TABS
50.0000 mg | ORAL_TABLET | Freq: Four times a day (QID) | ORAL | Status: DC | PRN
  Administered 2019-06-23: 50 mg via ORAL
  Filled 2019-06-23: qty 1

## 2019-06-23 MED ORDER — INSULIN GLARGINE 100 UNIT/ML ~~LOC~~ SOLN
35.0000 [IU] | Freq: Two times a day (BID) | SUBCUTANEOUS | Status: DC
  Administered 2019-06-24: 35 [IU] via SUBCUTANEOUS
  Filled 2019-06-23 (×3): qty 0.35

## 2019-06-23 NOTE — Progress Notes (Addendum)
STROKE TEAM PROGRESS NOTE   INTERVAL HISTORY Multiple family members are at the bedside.  She did sign consent for participation in the sleep smart study and underwent overnight [place] 3 monitor and tested positive for sleep apnea.  She will try CPAP mask tolerability tonight.  Echocardiogram shows normal ejection fraction without cardiac source of embolism. Carotid ultrasound shows no significant bilateral extracranial stenosis Vitals:   06/23/19 0056 06/23/19 0424 06/23/19 0807 06/23/19 1200  BP: 140/62 (!) 142/75 139/72 128/61  Pulse: 70 72 65 (!) 59  Resp: 17 16 16 16   Temp: 97.6 F (36.4 C) 97.7 F (36.5 C) 98.5 F (36.9 C) 99.1 F (37.3 C)  TempSrc: Oral Oral Oral Oral  SpO2: 97% 97% 100% 98%  Weight:      Height:        CBC:  Recent Labs  Lab 06/20/19 1612 06/20/19 1642 06/22/19 0450 06/23/19 0816  WBC 8.6   < > 5.1 5.8  NEUTROABS 5.1  --   --  3.1  HGB 11.9*   < > 11.4* 11.2*  HCT 37.5   < > 35.2* 34.6*  MCV 89.3   < > 88.7 87.4  PLT 319   < > 293 292   < > = values in this interval not displayed.    Basic Metabolic Panel:  Recent Labs  Lab 06/22/19 0450 06/23/19 0816  NA 136 137  K 4.3 4.0  CL 105 107  CO2 23 22  GLUCOSE 272* 161*  BUN 40* 33*  CREATININE 1.73* 1.78*  CALCIUM 9.1 9.6   Lipid Panel:     Component Value Date/Time   CHOL 279 (H) 06/21/2019 1115   TRIG 359 (H) 06/21/2019 1115   HDL 38 (L) 06/21/2019 1115   CHOLHDL 7.3 06/21/2019 1115   VLDL 72 (H) 06/21/2019 1115   LDLCALC 169 (H) 06/21/2019 1115   HgbA1c:  Lab Results  Component Value Date   HGBA1C 9.1 (H) 06/21/2019   Urine Drug Screen: No results found for: LABOPIA, COCAINSCRNUR, LABBENZ, AMPHETMU, THCU, LABBARB  Alcohol Level No results found for: ETH  IMAGING past 24 hours No results found.  PHYSICAL EXAM Obese middle-aged African-American lady not in distress. . Afebrile. Head is nontraumatic. Neck is supple without bruit.    Cardiac exam no murmur or gallop. Lungs are  clear to auscultation. Distal pulses are well felt. Neurological Exam ;  Awake  Alert oriented x 3.  Mildly dysarthric speech and language.eye movements full without nystagmus.fundi were not visualized. Vision acuity and fields appear normal. Hearing is normal. Palatal movements are normal. Face asymmetric with left lower facial droop.. Tongue midline. Normal strength, tone, reflexes and coordination except mild left lower extremity drift..  Diminished left face and body sensation. Gait deferred.  NIH stroke scale 4.  Premorbid modified Rankin score 0 ASSESSMENT/PLAN [name] is a 69 y.o. female with history of uncontrolled diabetes, hypertension, hypercholesterolemia presenting with 5-6d hx of L face, arm and leg decreased sensation.   Stroke:   R thalamic lacunar infarct secondary to small vessel disease source  MRI  R thalamic lacune. Small vessel disease. Multiple old lacunes.  MRA  No LVO. distal R ICA cavernous moderate to severe stenosis. R MCA distal M1, proximal M2 multifocal stenosis   Carotid Doppler  B ICA 1-39% stenosis, VAs antegrade   2D Echo pending yes  LDL 169  HgbA1c 9.1  Heparin 5000 units sq tid for VTE prophylaxis  aspirin 81 mg daily prior  to admission, now on aspirin 325 mg daily and clopidogrel 75 mg daily. Decrease aspirin to 81 and Continue DAPT x 3 weeks then plavix alone.   Therapy recommendations:  HH PT, HH OT, R wrist splint for + Tinel's  Disposition:  Return home  Hypertension  Stable in the 180-190s . Permissive hypertension (OK if < 220/120) but gradually normalize in 5-7 days . Long-term BP goal normotensive  Hyperlipidemia  Home meds:  No statin  LDL 169, goal < 70  Added liipitor 80  Continue statin at discharge  Diabetes type II Uncontrolled  HgbA1c 9.1, goal < 7.0  Other Stroke Risk Factors  Advanced age  Obesity, Body mass index is 37.22 kg/m., recommend weight loss, diet and exercise as appropriate    Family hx stroke (mother, father)  Possible obstructive sleep apnea. Patient interested in participating in the Sleep Smart trial. [facility] will follow up for possible enrollment. Please contact them at [phone] for any questions.    Other Active Problems  Normocytic anemia  Hx renal cell carcinoma   Hospital day # 2   Recommend    Aspirin and Plavix for 3 weeks followed by plavix alone.  Patient has signed consent for participation in the sleep smart study and tested positive on the [facility] 3 monitor last night for sleep apnea.  She will try CPAP mask titration tonight discussed with Dr. Einar Grad.  Greater than 50% time during this 25-minute visit was spent in counseling and coordination of care and discussion about stroke prevention and treatment and answering questions Antony Contras, MD To contact Stroke Continuity provider, please refer to [email or web address] After hours, contact General Neurology

## 2019-06-23 NOTE — Progress Notes (Signed)
PROGRESS NOTE    Briana Garrison  YQI:[id number] DOB: 12-12-1950 DOA: 06/20/2019 PCP: [name], MD   Brief Narrative:   Briana Garrison is a 69 y.o. right-handed female with medical history significant of hypertension, hyperlipidemia, diabetes mellitus type 2, renal cell carcinoma s/p partial nephrectomy and GERD presented with complaints of 5 days of left-sided numbness. She reported having numbness sensation that radiated from her face on the left side all the way down to her feet.  Associated symptoms included complaints of a headache, some mild left sided weakness, numbness in the right hand, some slurred speech, intermittent difficulty swallowing liquids, nausea, and decreased memory reported to be repeating herself.   Upon admission into the emergency department patient was seen to be afebrile with stable hemodynamics.  Patient was noted to be out of the window for TPA.  MRI revealed acute lacunar infarct of the right thalamus.  Patient was admitted under hospital service and neurology consulted.  Assessment & Plan:   Principal Problem:   Acute CVA (cerebrovascular accident) [facility]) Active Problems:   Acute kidney injury superimposed on CKD ([place])   Type 2 diabetes mellitus with hyperlipidemia ([place])   Essential hypertension   Hyperlipidemia   Normocytic anemia   History of renal cell carcinoma  CVA: Subacute, right thalamic, nonhemorrhagic.  MRI revealed acute lacunar infarct of the right thalamus.  MR angiogram of head without contrast shows no emergent large vessel occlusion.  Moderate to severe stenosis of distal cavernous segment of the right ICA.  Multifocal severe stenosis of the right MCA including the distal M1 segment and proximal M2 segment.  Patient seen by neurology and per my discussion with Dr. Leonie Man, who recommend continuing DAPT for 3 weeks and no other intervention for stenosis as her stroke is not where her vessels are involved.  Doppler carotid negative for any  stenosis or occlusion.  PT OT on board SLP on board.  Home health PT OT recommended.  Case manager on board.  Appreciate neurology help and management per them.  Acute kidney injury superimposed on chronic kidney disease 3B: Baseline creatinine seems to be around 1.4 and 1.5 with GFR in low 40s.  Presented with creatinine of 2.31 and GFR 24.  This has improved but still elevated than her baseline.  Renal ultrasound negative.  Continue IV fluids.  Hold nephrotoxic agents.   Essential hypertension: Patient out of the window for allowing permissive hypertension.  Blood pressure improved now.  Continue home blood pressure medications which include Coreg 12.5 mg twice daily, clonidine 0.2 mg twice daily, and furosemide 20 mg twice daily (holding Lasix due to AKI).  Diabetes mellitus type 2 with neuropathy:-Patient presents with blood sugars low normal at 76.  At home patient had been working hard to get better control of her blood sugars.  Hemoglobin A1c 9.1.  Patient tells me that she uses 30 units of Lantus in the morning and 30 5 at night however per records here, blood sugar still elevated despite of being on Lantus 30 units twice daily and Premeal regimen.  Will increase Lantus to 35 units and continue current Premeal and SSI. Continue gabapentin.  Normocytic anemia: Stable.  Watch.  History of renal cell carcinoma: Patient status post partial nephrectomy.Renal ultrasound with no significant renal abnormality  Mixed hyperlipidemia: Significant elevated LDL and cholesterol and low HDL.  Increase atorvastatin to 80 mg.  DVT prophylaxis: Lovenox   Code Status: Full Code  Family Communication: Friend and daughter present at bedside.  Plan of care discussed with patient and family in length and they verbalized understanding and agreed with it. Patient is from: Home Disposition Plan: Home with home health tomorrow. Barriers to discharge: Patient to be kept in the hospital overnight per neurology  commendation for CPAP mask fitting as she was diagnosed to have OSA based on the smart sleep study  Status is: Inpatient  Remains inpatient appropriate because:Inpatient level of care appropriate due to severity of illness   Dispo: The patient is from: Home              Anticipated d/c is to: Home with home health              Anticipated d/c date is: 1 day              Patient currently is not medically stable to d/c.         Estimated body mass index is 37.22 kg/m as calculated from the following:   Height as of this encounter: 5\' 1"  (1.549 m).   Weight as of this encounter: 89.4 kg.      Nutritional status:               Consultants:   Neurology  Procedures:   None  Antimicrobials:  Anti-infectives (From admission, onward)   None         Subjective: Patient seen and examined this morning.  Her friend and daughter at the bedside.  Patient feels much better as far as her speech goes but she still has left-sided numbness.  Objective: Vitals:   06/23/19 0056 06/23/19 0424 06/23/19 0807 06/23/19 1200  BP: 140/62 (!) 142/75 139/72 128/61  Pulse: 70 72 65 (!) 59  Resp: 17 16 16 16   Temp: 97.6 F (36.4 C) 97.7 F (36.5 C) 98.5 F (36.9 C) 99.1 F (37.3 C)  TempSrc: Oral Oral Oral Oral  SpO2: 97% 97% 100% 98%  Weight:      Height:        Intake/Output Summary (Last 24 hours) at 06/23/2019 1412 Last data filed at 06/23/2019 0500 Gross per 24 hour  Intake 1305.3 ml  Output --  Net 1305.3 ml   Filed Weights   06/20/19 1604 06/20/19 1605  Weight: 89.4 kg 89.4 kg    Examination:  General exam: Appears calm and comfortable  Respiratory system: Clear to auscultation. Respiratory effort normal. Cardiovascular system: S1 & S2 heard, RRR. No JVD, murmurs, rubs, gallops or clicks. No pedal edema. Gastrointestinal system: Abdomen is nondistended, soft and nontender. No organomegaly or masses felt. Normal bowel sounds heard. Central nervous  system: Alert and oriented.  Expressive dysarthria which is improved.  Diminished sensation at the left lower extremity.  Left facial droop.  Extremities: Symmetric 5 x 5 power. Skin: No rashes, lesions or ulcers.  Psychiatry: Judgement and insight appear normal. Mood & affect appropriate.   Data Reviewed: I have personally reviewed following labs and imaging studies  CBC: Recent Labs  Lab 06/20/19 1612 06/20/19 1642 06/22/19 0450 06/23/19 0816  WBC 8.6  --  5.1 5.8  NEUTROABS 5.1  --   --  3.1  HGB 11.9* 11.9* 11.4* 11.2*  HCT 37.5 35.0* 35.2* 34.6*  MCV 89.3  --  88.7 87.4  PLT 319  --  293 672   Basic Metabolic Panel: Recent Labs  Lab 06/20/19 1612 06/20/19 1642 06/22/19 0450 06/23/19 0816  NA 138 139 136 137  K 4.3 4.1 4.3 4.0  CL 101 105  105 107  CO2 24  --  23 22  GLUCOSE 76 71 272* 161*  BUN 53* 50* 40* 33*  CREATININE 2.31* 2.50* 1.73* 1.78*  CALCIUM 9.6  --  9.1 9.6   GFR: Estimated Creatinine Clearance: 30.3 mL/min (A) (by C-G formula based on SCr of 1.78 mg/dL (H)). Liver Function Tests: Recent Labs  Lab 06/20/19 1612  AST 14*  ALT 16  ALKPHOS 83  BILITOT 0.5  PROT 7.2  ALBUMIN 3.5   No results for input(s): LIPASE, AMYLASE in the last 168 hours. No results for input(s): AMMONIA in the last 168 hours. Coagulation Profile: Recent Labs  Lab 06/20/19 1612  INR 1.0   Cardiac Enzymes: No results for input(s): CKTOTAL, CKMB, CKMBINDEX, TROPONINI in the last 168 hours. BNP (last 3 results) No results for input(s): PROBNP in the last 8760 hours. HbA1C: Recent Labs    06/21/19 1115  HGBA1C 9.1*   CBG: Recent Labs  Lab 06/22/19 1621 06/22/19 2143 06/23/19 0557 06/23/19 0838 06/23/19 1119  GLUCAP 229* 211* 207* 147* 209*   Lipid Profile: Recent Labs    06/21/19 1115  CHOL 279*  HDL 38*  LDLCALC 169*  TRIG 359*  CHOLHDL 7.3   Thyroid Function Tests: No results for input(s): TSH, T4TOTAL, FREET4, T3FREE, THYROIDAB in the last 72  hours. Anemia Panel: No results for input(s): VITAMINB12, FOLATE, FERRITIN, TIBC, IRON, RETICCTPCT in the last 72 hours. Sepsis Labs: No results for input(s): PROCALCITON, LATICACIDVEN in the last 168 hours.  Recent Results (from the past 240 hour(s))  SARS Coronavirus 2 by RT PCR (hospital order, performed in [facility] hospital lab) Nasopharyngeal Nasopharyngeal Swab     Status: None   Collection Time: 06/21/19  8:29 AM   Specimen: Nasopharyngeal Swab  Result Value Ref Range Status   SARS Coronavirus 2 NEGATIVE NEGATIVE Final    Comment: (NOTE) SARS-CoV-2 target nucleic acids are NOT DETECTED. The SARS-CoV-2 RNA is generally detectable in upper and lower respiratory specimens during the acute phase of infection. The lowest concentration of SARS-CoV-2 viral copies this assay can detect is 250 copies / mL. A negative result does not preclude SARS-CoV-2 infection and should not be used as the sole basis for treatment or other patient management decisions.  A negative result may occur with improper specimen collection / handling, submission of specimen other than nasopharyngeal swab, presence of viral mutation(s) within the areas targeted by this assay, and inadequate number of viral copies (<250 copies / mL). A negative result must be combined with clinical observations, patient history, and epidemiological information. Fact Sheet for Patients:   StrictlyIdeas.no Fact Sheet for Healthcare Providers: BankingDealers.co.za This test is not yet approved or cleared  by the [place] FDA and has been authorized for detection and/or diagnosis of SARS-CoV-2 by FDA under an Emergency Use Authorization (EUA).  This EUA will remain in effect (meaning this test can be used) for the duration of the COVID-19 declaration under Section 564(b)(1) of the Act, 21 U.S.C. section 360bbb-3(b)(1), unless the authorization is terminated or revoked  sooner. Performed at [facility], [place] [address], [place], [place] [address]       Radiology Studies: MR ANGIO HEAD WO CONTRAST  Result Date: 06/21/2019 CLINICAL DATA:  Stroke follow-up EXAM: MRA HEAD WITHOUT CONTRAST TECHNIQUE: Angiographic images of the Circle of Willis were obtained using MRA technique without intravenous contrast. COMPARISON:  Brain MRI 06/21/2019 FINDINGS: POSTERIOR CIRCULATION: --Vertebral arteries: Normal V4 segments. --Inferior cerebellar arteries: Normal. --Basilar artery:  Normal. --Superior cerebellar arteries: Normal. --Posterior cerebral arteries: Normal. ANTERIOR CIRCULATION: --Intracranial internal carotid arteries: Moderate-to-severe stenosis of the distal cavernous segment of the right ICA. --Anterior cerebral arteries (ACA): Normal. Both A1 segments are present. Patent anterior communicating artery (a-comm). --Middle cerebral arteries (MCA): Multifocal severe stenosis of the right MCA including the distal M1 segment and proximal M2. Normal left MCA. IMPRESSION: 1. No emergent large vessel occlusion. 2. Moderate-to-severe stenosis of the distal cavernous segment of the right ICA. 3. Multifocal severe stenosis of the right MCA including the distal M1 segment and proximal M2. Electronically Signed   By: Ulyses Jarred M.D.   On: 06/21/2019 22:37   ECHOCARDIOGRAM COMPLETE  Result Date: 06/22/2019    ECHOCARDIOGRAM REPORT   Patient Name:   Briana Garrison Date of Exam: 06/22/2019 Medical Rec #:  [id number]       Height:       61.0 in Accession #:    [id number]      Weight:       197.0 lb Date of Birth:  03/24/1950       BSA:          1.877 m Patient Age:    49 years        BP:           190/77 mmHg Patient Gender: F               HR:           64 bpm. Exam Location:  Inpatient Procedure: 2D Echo, Color Doppler and Cardiac Doppler Indications:    Stroke i163.9  History:        Patient has prior history of Echocardiogram examinations, most                 recent  10/21/2018. Risk Factors:Hypertension, Diabetes and                 Dyslipidemia.  Sonographer:    Raquel Sarna Senior RDCS Referring Phys: [phone] [place]  1. Left ventricular ejection fraction, by estimation, is 60 to 65%. The left ventricle has normal function. The left ventricle has no regional wall motion abnormalities. There is severe concentric left ventricular hypertrophy. Left ventricular diastolic  parameters are consistent with Grade II diastolic dysfunction (pseudonormalization). Elevated left atrial pressure.  2. Right ventricular systolic function is normal. The right ventricular size is normal. Tricuspid regurgitation signal is inadequate for assessing PA pressure.  3. The mitral valve is grossly normal. No evidence of mitral valve regurgitation. No evidence of mitral stenosis.  4. The aortic valve is tricuspid. Aortic valve regurgitation is not visualized. Mild aortic valve sclerosis is present, with no evidence of aortic valve stenosis.  5. The inferior vena cava is normal in size with <50% respiratory variability, suggesting right atrial pressure of 8 mmHg. Comparison(s): No significant change from prior study. Conclusion(s)/Recommendation(s): No intracardiac source of embolism detected on this transthoracic study. A transesophageal echocardiogram is recommended to exclude cardiac source of embolism if clinically indicated. FINDINGS  Left Ventricle: Left ventricular ejection fraction, by estimation, is 60 to 65%. The left ventricle has normal function. The left ventricle has no regional wall motion abnormalities. The left ventricular internal cavity size was normal in size. There is  severe concentric left ventricular hypertrophy. Left ventricular diastolic parameters are consistent with Grade II diastolic dysfunction (pseudonormalization). Elevated left atrial pressure. Right Ventricle: The right ventricular size is normal. No increase in right ventricular wall thickness. Right  ventricular systolic function is normal. Tricuspid regurgitation signal is inadequate for assessing PA pressure. Left Atrium: Left atrial size was normal in size. Right Atrium: Right atrial size was normal in size. Pericardium: Trivial pericardial effusion is present. The pericardial effusion is circumferential. Presence of pericardial fat pad. Mitral Valve: The mitral valve is grossly normal. No evidence of mitral valve regurgitation. No evidence of mitral valve stenosis. Tricuspid Valve: The tricuspid valve is grossly normal. Tricuspid valve regurgitation is not demonstrated. No evidence of tricuspid stenosis. Aortic Valve: The aortic valve is tricuspid. Aortic valve regurgitation is not visualized. Mild aortic valve sclerosis is present, with no evidence of aortic valve stenosis. Pulmonic Valve: The pulmonic valve was grossly normal. Pulmonic valve regurgitation is not visualized. No evidence of pulmonic stenosis. Aorta: The aortic root and ascending aorta are structurally normal, with no evidence of dilitation. Venous: The inferior vena cava is normal in size with less than 50% respiratory variability, suggesting right atrial pressure of 8 mmHg. IAS/Shunts: The atrial septum is grossly normal.  LEFT VENTRICLE PLAX 2D LVIDd:         3.20 cm  Diastology LVIDs:         2.00 cm  LV e' lateral:   3.81 cm/s LV PW:         1.70 cm  LV E/e' lateral: 26.5 LV IVS:        1.70 cm  LV e' medial:    4.68 cm/s LVOT diam:     1.80 cm  LV E/e' medial:  21.6 LV SV:         48 LV SV Index:   25 LVOT Area:     2.54 cm  RIGHT VENTRICLE RV S prime:     10.70 cm/s TAPSE (M-mode): 1.8 cm LEFT ATRIUM           Index       RIGHT ATRIUM          Index LA diam:      3.30 cm 1.76 cm/m  RA Area:     9.83 cm LA Vol (A2C): 40.5 ml 21.58 ml/m RA Volume:   17.60 ml 9.38 ml/m LA Vol (A4C): 46.1 ml 24.56 ml/m  AORTIC VALVE LVOT Vmax:   80.30 cm/s LVOT Vmean:  58.500 cm/s LVOT VTI:    0.187 m  AORTA Ao Root diam: 2.90 cm Ao Asc diam:  3.00  cm MITRAL VALVE MV Area (PHT): 2.39 cm     SHUNTS MV Decel Time: 317 msec     Systemic VTI:  0.19 m MV E velocity: 101.00 cm/s  Systemic Diam: 1.80 cm MV A velocity: 142.00 cm/s MV E/A ratio:  0.71 Eleonore Chiquito MD Electronically signed by Eleonore Chiquito MD Signature Date/Time: 06/22/2019/2:01:46 PM    Final    VAS US CAROTID  Result Date: 06/21/2019 Carotid Arterial Duplex Study Indications:       CVA. Risk Factors:      Hypertension, hyperlipidemia, Diabetes. Comparison Study:  no prior Performing Technologist: Abram Sander RVS  Examination Guidelines: A complete evaluation includes B-mode imaging, spectral Doppler, color Doppler, and power Doppler as needed of all accessible portions of each vessel. Bilateral testing is considered an integral part of a complete examination. Limited examinations for reoccurring indications may be performed as noted.  Right Carotid Findings: +----------+--------+--------+--------+------------------+--------+           PSV cm/sEDV cm/sStenosisPlaque DescriptionComments +----------+--------+--------+--------+------------------+--------+ CCA Prox  54      12  heterogenous               +----------+--------+--------+--------+------------------+--------+ CCA Distal44      12              heterogenous               +----------+--------+--------+--------+------------------+--------+ ICA Prox  69      11      1-39%   heterogenous               +----------+--------+--------+--------+------------------+--------+ ICA Distal63      23                                         +----------+--------+--------+--------+------------------+--------+ ECA       65                                                 +----------+--------+--------+--------+------------------+--------+ +----------+--------+-------+--------+-------------------+           PSV cm/sEDV cmsDescribeArm Pressure (mmHG)  +----------+--------+-------+--------+-------------------+ [id number]                                         +----------+--------+-------+--------+-------------------+ +---------+--------+--+--------+-+---------+ VertebralPSV cm/s25EDV cm/s6Antegrade +---------+--------+--+--------+-+---------+  Left Carotid Findings: +----------+--------+--------+--------+------------------+--------+           PSV cm/sEDV cm/sStenosisPlaque DescriptionComments +----------+--------+--------+--------+------------------+--------+ CCA Prox  82      10              heterogenous               +----------+--------+--------+--------+------------------+--------+ CCA Distal59      13              heterogenous               +----------+--------+--------+--------+------------------+--------+ ICA Prox  39      13      1-39%   heterogenous               +----------+--------+--------+--------+------------------+--------+ ICA Distal54      13                                         +----------+--------+--------+--------+------------------+--------+ ECA       49      7                                          +----------+--------+--------+--------+------------------+--------+ +----------+--------+--------+--------+-------------------+           PSV cm/sEDV cm/sDescribeArm Pressure (mmHG) +----------+--------+--------+--------+-------------------+ [id number]                                          +----------+--------+--------+--------+-------------------+ +---------+--------+--+--------+--+---------+ VertebralPSV cm/s41EDV cm/s12Antegrade +---------+--------+--+--------+--+---------+   Summary: Right Carotid: Velocities in the right ICA are consistent with a 1-39% stenosis. Left Carotid: Velocities in the left ICA are consistent with a 1-39% stenosis. Vertebrals: Bilateral vertebral arteries demonstrate antegrade flow. *See table(s) above for measurements and  observations.     Preliminary     Scheduled Meds: .  stroke: mapping our early stages of recovery book   Does not apply Once  . aspirin EC  81 mg Oral Daily  . atorvastatin  80 mg Oral Daily  . carvedilol  12.5 mg Oral BID WC  . cloNIDine  0.2 mg Oral BID  . clopidogrel  75 mg Oral Daily  . fluticasone  1 spray Each Nare Daily  . gabapentin  300 mg Oral QHS  . heparin  5,000 Units Subcutaneous Q8H  . insulin aspart  0-15 Units Subcutaneous TID WC  . insulin aspart  0-5 Units Subcutaneous QHS  . insulin aspart  4 Units Subcutaneous TID WC  . insulin glargine  30 Units Subcutaneous BID  . sodium chloride flush  3 mL Intravenous Once   Continuous Infusions: . sodium chloride 75 mL/hr at 06/22/19 1114     LOS: 2 days   Time spent: 30 minutes   Darliss Cheney, MD [facility]  06/23/2019, 2:12 PM   To contact the attending provider between 7A-7P or the covering provider during after hours 7P-7A, please log into the web site [email or web address].

## 2019-06-23 NOTE — Progress Notes (Signed)
Physical Therapy Treatment Patient Details Name: Briana Garrison MRN: [id number] DOB: [birth year] Today's Date: 06/23/2019    History of Present Illness Pt is a 69 y.o. female admitted 06/21/19 with 5 to 6-day h/o L face, UE and LE numbness; outside tPA window. MRI showed acute lacunar infarct in R thalamus; multiple old lacunar wounds. MRA showed mild bilateral cavernous carotid and right M2 middle cerebral artery atherosclerosis. Pt also c/o some R hand numbness. PMH includes uncontrolled DM, HTN.   PT Comments    Pt progressing well with mobility. Remains limited by L-side numbness, although strength WFL. Stability improved with use of RW, pt and daughter agree to have pt use this upon return home. Increased time discussing safety and fall risk reduction strategies for return home due to increased fall risk with LLE numbness. Pt remains motivated to participate and hopeful for return home soon. Family present and supportive.    Follow Up Recommendations  Home health PT;Supervision/Assistance - 24 hour     Equipment Recommendations  Rolling walker with 5" wheels;3in1 (PT)    Recommendations for Other Services       Precautions / Restrictions Precautions Precautions: Fall Required Braces or Orthoses: Other Brace Other Brace: R wrist splint overnight per OT Restrictions Weight Bearing Restrictions: No    Mobility  Bed Mobility Overal bed mobility: Needs Assistance Bed Mobility: Supine to Sit     Supine to sit: Supervision     General bed mobility comments: Use of bed rail  Transfers Overall transfer level: Needs assistance Equipment used: None;Rolling walker (2 wheeled) Transfers: Sit to/from Stand Sit to Stand: Min guard         General transfer comment: Able to stand from EOB without DME, min guard for balance, pt with increased time to stabilize self upon standing, reaching back to bed for UE support; trial from recliner to RW with min  guard  Ambulation/Gait Ambulation/Gait assistance: Min guard;Min assist Gait Distance (Feet): 250 Feet Assistive device: None;Rolling walker (2 wheeled) Gait Pattern/deviations: Step-through pattern;Decreased stride length;Staggering left Gait velocity: Decreased   General Gait Details: Amb ~8' without DME, close min guard for balance, pt with slow, mildly unsteady gait; stability much improved with RW, close min guard for balance, 1x self-correct LOB with turn   Stairs Stairs: Yes Stairs assistance: Min guard Stair Management: Two rails;One rail Right;Step to pattern;Forwards Number of Stairs: 4 General stair comments: Ascend 4 steps with BUE rail support, descended with R-side rail support; min guard for balance. Educ pt and daughter on HHA from family on steps into home for balance/safety, as pt does not have rails   Wheelchair Mobility    Modified Rankin (Stroke Patients Only) Modified Rankin (Stroke Patients Only) Pre-Morbid Rankin Score: No symptoms Modified Rankin: Moderately severe disability     Balance Overall balance assessment: Needs assistance   Sitting balance-Leahy Scale: Fair       Standing balance-Leahy Scale: Fair Standing balance comment: Can static stand and take steps without UE support; static and dynamic standing balance improved with RW                            Cognition Arousal/Alertness: Awake/alert Behavior During Therapy: WFL for tasks assessed/performed Overall Cognitive Status: Impaired/Different from baseline Area of Impairment: Attention;Memory;Following commands                   Current Attention Level: Selective Memory: Decreased short-term memory Following Commands: Follows multi-step commands  inconsistently       General Comments: Jun 25, 2019; reoriented to 27th. Pt reports feeling cognition back to baseline. Daughter and friend present, report pt still with intermittent confusion and repeating herself       Exercises      General Comments General comments (skin integrity, edema, etc.): Strength remains 5/5 in LUE/LLE, pt still reports diffuse numbness through L-side. Increased time discussing strategies for safety and fall risk reduction upon return home with pt and daughter, especially with L lower leg numbness increasing risk for falls      Pertinent Vitals/Pain Pain Assessment: Faces Faces Pain Scale: Hurts a little bit Pain Location: Head Pain Descriptors / Indicators: Tightness Pain Intervention(s): Monitored during session    Home Living                      Prior Function            PT Goals (current goals can now be found in the care plan section) Progress towards PT goals: Progressing toward goals    Frequency    Min 4X/week      PT Plan Current plan remains appropriate    Co-evaluation              AM-PAC PT "6 Clicks" Mobility   Outcome Measure  Help needed turning from your back to your side while in a flat bed without using bedrails?: None Help needed moving from lying on your back to sitting on the side of a flat bed without using bedrails?: None Help needed moving to and from a bed to a chair (including a wheelchair)?: A Little Help needed standing up from a chair using your arms (e.g., wheelchair or bedside chair)?: A Little Help needed to walk in hospital room?: A Little Help needed climbing 3-5 steps with a railing? : A Little 6 Click Score: 20    End of Session Equipment Utilized During Treatment: Gait belt Activity Tolerance: Patient tolerated treatment well Patient left: in chair;with call bell/phone within reach;with family/visitor present Nurse Communication: Mobility status PT Visit Diagnosis: Unsteadiness on feet (R26.81)     Time: 7680-8811 PT Time Calculation (min) (ACUTE ONLY): 24 min  Charges:  $Gait Training: 8-22 mins $Self Care/Home Management: 8-22                     Mabeline Caras, PT, DPT Acute  Rehabilitation Services  Pager [phone] Office [place] 06/23/2019, 10:56 AM

## 2019-06-23 NOTE — TOC Initial Note (Signed)
Transition of Care [facility]) - Initial/Assessment Note    Patient Details  Name: Briana Garrison MRN: [id number] Date of Birth: 08/01/1950  Transition of [facility] CM/SW Contact:    Marilu Favre, RN Phone Number: 06/23/2019, 11:37 AM  Clinical Narrative:                  Patient and daughter Briana Garrison accepted 3 in1 from [place]. Discussed home health PT. Provided Medicare.gov list. Patient and Briana Garrison wanted [facility] [phone], called they are not in network with patient's insurance, was told only agencies in network with patient's insurance in that area are [facility].   Patient and Briana Garrison chose [facility] awaiting call back.  Expected Discharge Plan: [place] Barriers to Discharge: Continued Medical Work up   Patient Goals and CMS Choice Patient states their goals for this hospitalization and ongoing recovery are:: to return to home CMS Medicare.gov Compare Post Acute Care list provided to:: Patient Choice offered to / list presented to : Patient, Adult Children(daughter)  Expected Discharge Plan and Services Expected Discharge Plan: [place] In-house Referral: Clinical Social Work Discharge Planning Services: CM Consult Post Acute Care Choice: Home Health, Durable Medical Equipment Living arrangements for the past 2 months: [place]                 DME Arranged: 3-N-1 DME Agency: [employer] Date DME Agency Contacted: 06/23/19 Time DME Agency Contacted: 0900   HH Arranged: PT, OT          Prior Living Arrangements/Services Living arrangements for the past 2 months: Single Family Home Lives with:: Adult Children, Spouse Patient language and need for interpreter reviewed:: Yes(no needs) Do you feel safe going back to the place where you live?: Yes      Need for Family Participation in Patient Care: Yes (Comment) Care giver support system in place?: Yes (comment) Current  home services: DME Criminal Activity/Legal Involvement Pertinent to Current Situation/Hospitalization: No - Comment as needed  Activities of Daily Living Home Assistive Devices/Equipment: Walker (specify type) ADL Screening (condition at time of admission) Patient's cognitive ability adequate to safely complete daily activities?: Yes Is the patient deaf or have difficulty hearing?: Yes Does the patient have difficulty seeing, even when wearing glasses/contacts?: No Does the patient have difficulty concentrating, remembering, or making decisions?: No Patient able to express need for assistance with ADLs?: Yes Does the patient have difficulty dressing or bathing?: No Independently performs ADLs?: Yes (appropriate for developmental age) Does the patient have difficulty walking or climbing stairs?: Yes Weakness of Legs: Both Weakness of Arms/Hands: Right  Permission Sought/Granted Permission sought to share information with : Family Supports Permission granted to share information with : Yes, Verbal Permission Granted  Share Information with NAME: Briana Garrison  daughter  Permission granted to share info w AGENCY: [facility] . Briana Garrison  Permission granted to share info w Relationship: spouse  Permission granted to share info w Contact Information: [phone]  Emotional Assessment Appearance:: Appears stated age Attitude/Demeanor/Rapport: Engaged Affect (typically observed): Accepting Orientation: : Oriented to Self, Oriented to Place, Oriented to  Time, Oriented to Situation Alcohol / Substance Use: Not Applicable Psych Involvement: No (comment)  Admission diagnosis:  Gait disturbance [R26.9] CVA (cerebral vascular accident) ([place]) [I63.9] Left-sided weakness [R53.1] Acute CVA (cerebrovascular accident) ([place]) [I63.9] History of renal cell cancer [Z85.528] Acute kidney injury superimposed on chronic kidney disease (  [place]) [N17.9, N18.9] Patient Active Problem List    Diagnosis Date Noted  . Acute CVA (cerebrovascular accident) ([place]) 06/21/2019  . History of renal cell carcinoma 06/21/2019  . Left ventricular hypertrophy 11/26/2018  . CKD stage G3b/A3, GFR 30-44 and albumin creatinine ratio >300 mg/g 11/26/2018  . Change in stool 08/03/2018  . Choledocholithiasis with obstruction 02/07/2017  . AKI (acute kidney injury) ([place]) 02/07/2017  . Acute kidney injury superimposed on CKD ([place]) 06/23/2011  . Hypoglycemia associated with diabetes ([place]) 06/23/2011  . Type 2 diabetes mellitus with hyperlipidemia ([place]) 06/23/2011  . Essential hypertension 06/23/2011  . Hyperlipidemia 06/23/2011  . Normocytic anemia 06/23/2011  . UTI (lower urinary tract infection) 06/23/2011   PCP:  [name], MD Pharmacy:   [facility] [address] [place] [address] [place] [address] Phone: [phone] Fax: [phone]  [place], [place] [address] [place] [address] Phone: [phone] Fax: [phone]     Social Determinants of Health (SDOH) Interventions    Readmission Risk Interventions Readmission Risk Prevention Plan 06/22/2019  Transportation Screening Complete  PCP or Specialist Appt within 5-7 Days Complete  Home Care Screening Complete  Medication Review (RN CM) Referral to Pharmacy  Some recent data might be hidden

## 2019-06-24 LAB — BASIC METABOLIC PANEL
Anion gap: 7 (ref 5–15)
BUN: 30 mg/dL — ABNORMAL HIGH (ref 8–23)
CO2: 23 mmol/L (ref 22–32)
Calcium: 9.3 mg/dL (ref 8.9–10.3)
Chloride: 106 mmol/L (ref 98–111)
Creatinine, Ser: 1.78 mg/dL — ABNORMAL HIGH (ref 0.44–1.00)
GFR calc Af Amer: 33 mL/min — ABNORMAL LOW (ref >60)
GFR calc non Af Amer: 29 mL/min — ABNORMAL LOW (ref >60)
Glucose, Bld: 189 mg/dL — ABNORMAL HIGH (ref 70–99)
Potassium: 4 mmol/L (ref 3.5–5.1)
Sodium: 136 mmol/L (ref 135–145)

## 2019-06-24 LAB — GLUCOSE, CAPILLARY
Glucose-Capillary: 87 mg/dL (ref 70–99)
Glucose-Capillary: 91 mg/dL (ref 70–99)

## 2019-06-24 MED ORDER — CLOPIDOGREL BISULFATE 75 MG PO TABS: 75.0000 mg | ORAL_TABLET | Freq: Every day | ORAL | 2 refills | Status: AC

## 2019-06-24 MED ORDER — ATORVASTATIN CALCIUM 80 MG PO TABS: 80.0000 mg | ORAL_TABLET | Freq: Every day | ORAL | 0 refills | Status: DC

## 2019-06-24 MED ORDER — INSULIN GLARGINE 100 UNIT/ML ~~LOC~~ SOLN: 35.0000 [IU] | Freq: Two times a day (BID) | SUBCUTANEOUS | 0 refills | Status: DC

## 2019-06-24 NOTE — Progress Notes (Signed)
STROKE TEAM PROGRESS NOTE   INTERVAL HISTORY Multiple family members are at the bedside.  She did tolerate CPAP last night and was randomized to the CPAP treatment arm in the sleep smart study and is very happy.  She is being discharged home today. Vitals:   06/24/19 0003 06/24/19 0436 06/24/19 0854 06/24/19 1231  BP: 115/60 130/61 (!) 143/70 (!) 145/71  Pulse: 65 67 63 63  Resp: 17 18  18   Temp: 98.7 F (37.1 C) 97.8 F (36.6 C)  (!) 97.5 F (36.4 C)  TempSrc: Oral Oral  Oral  SpO2: 98% 95%  97%  Weight:      Height:        CBC:  Recent Labs  Lab 06/20/19 1612 06/20/19 1642 06/22/19 0450 06/23/19 0816  WBC 8.6   < > 5.1 5.8  NEUTROABS 5.1  --   --  3.1  HGB 11.9*   < > 11.4* 11.2*  HCT 37.5   < > 35.2* 34.6*  MCV 89.3   < > 88.7 87.4  PLT 319   < > 293 292   < > = values in this interval not displayed.    Basic Metabolic Panel:  Recent Labs  Lab 06/23/19 0816 06/24/19 0833  NA 137 136  K 4.0 4.0  CL 107 106  CO2 22 23  GLUCOSE 161* 189*  BUN 33* 30*  CREATININE 1.78* 1.78*  CALCIUM 9.6 9.3   Lipid Panel:     Component Value Date/Time   CHOL 279 (H) 06/21/2019 1115   TRIG 359 (H) 06/21/2019 1115   HDL 38 (L) 06/21/2019 1115   CHOLHDL 7.3 06/21/2019 1115   VLDL 72 (H) 06/21/2019 1115   LDLCALC 169 (H) 06/21/2019 1115   HgbA1c:  Lab Results  Component Value Date   HGBA1C 9.1 (H) 06/21/2019   Urine Drug Screen: No results found for: LABOPIA, COCAINSCRNUR, LABBENZ, AMPHETMU, THCU, LABBARB  Alcohol Level No results found for: ETH  IMAGING past 24 hours No results found.  PHYSICAL EXAM Obese middle-aged African-American lady not in distress. . Afebrile. Head is nontraumatic. Neck is supple without bruit.    Cardiac exam no murmur or gallop. Lungs are clear to auscultation. Distal pulses are well felt. Neurological Exam ;  Awake  Alert oriented x 3.  Mildly dysarthric speech and language.eye movements full without nystagmus.fundi were not visualized.  Vision acuity and fields appear normal. Hearing is normal. Palatal movements are normal. Face asymmetric with left lower facial droop.. Tongue midline. Normal strength, tone, reflexes and coordination except mild left lower extremity drift..  Diminished left face and body sensation. Gait deferred.  NIH stroke scale 4.  Premorbid modified Rankin score 0 ASSESSMENT/PLAN Ms. PERSIA LINTNER is a 69 y.o. female with history of uncontrolled diabetes, hypertension, hypercholesterolemia presenting with 5-6d hx of L face, arm and leg decreased sensation.   Stroke:   R thalamic lacunar infarct secondary to small vessel disease source  MRI  R thalamic lacune. Small vessel disease. Multiple old lacunes.  MRA  No LVO. distal R ICA cavernous moderate to severe stenosis. R MCA distal M1, proximal M2 multifocal stenosis   Carotid Doppler  B ICA 1-39% stenosis, VAs antegrade   2D Echo pending yes  LDL 169  HgbA1c 9.1  Heparin 5000 units sq tid for VTE prophylaxis  aspirin 81 mg daily prior to admission, now on aspirin 325 mg daily and clopidogrel 75 mg daily. Decrease aspirin to 81 and Continue DAPT x 3  weeks then plavix alone.   Therapy recommendations:  HH PT, HH OT, R wrist splint for + Tinel's  Disposition:  Return home  Hypertension  Stable in the 180-190s . Permissive hypertension (OK if < 220/120) but gradually normalize in 5-7 days . Long-term BP goal normotensive  Hyperlipidemia  Home meds:  No statin  LDL 169, goal < 70  Added liipitor 80  Continue statin at discharge  Diabetes type II Uncontrolled  HgbA1c 9.1, goal < 7.0  Other Stroke Risk Factors  Advanced age  Obesity, Body mass index is 37.22 kg/m., recommend weight loss, diet and exercise as appropriate   Family hx stroke (mother, father)  Possible obstructive sleep apnea. Patient interested in participating in the Sleep Smart trial. [facility] will follow up for possible  enrollment. Please contact them at [phone] for any questions.    Other Active Problems  Normocytic anemia  Hx renal cell carcinoma   Hospital day # 3   Recommend    Aspirin and Plavix for 3 weeks followed by plavix alone.  Patient is randomized to the CPAP treatment arm of the sleep smart stroke prevention study.  Discussed with Dr. Einar Grad.  Greater than 50% time during this 25-minute visit was spent in counseling and coordination of care and discussion about stroke prevention and treatment and answering questions.  Follow-up as an outpatient in the sleep research clinic as per sleep smart study schedule.  Stroke team will sign off.  Kindly call for questions. Antony Contras, MD To contact Stroke Continuity provider, please refer to [email or web address] After hours, contact General Neurology

## 2019-06-24 NOTE — Progress Notes (Signed)
Nsg Discharge Note  Patient discharged home with [facility]. Daughter present for discharge instructions. Stroke education reviewed and handout provided. DME delivered to patient room.  Admit Date:  06/20/2019 Discharge date: 06/24/2019   AFTYN NOTT to be D/C'd Home per MD order.  AVS completed.  Copy for chart, and copy for patient signed, and dated. Patient/caregiver able to verbalize understanding.  Discharge Medication: Allergies as of 06/24/2019      Reactions   Amlodipine Swelling   Tramadol Diarrhea, Nausea And Vomiting      Medication List    TAKE these medications   albuterol 108 (90 Base) MCG/ACT inhaler Commonly known as: VENTOLIN HFA Inhale 2 puffs into the lungs every 4 (four) hours as needed for wheezing or shortness of breath (or coughing).   aspirin EC 81 MG tablet Take 81 mg by mouth daily.   atorvastatin 80 MG tablet Commonly known as: LIPITOR Take 1 tablet (80 mg total) by mouth daily.   carvedilol 12.5 MG tablet Commonly known as: COREG Take 12.5 mg by mouth 2 (two) times daily with a meal.   cloNIDine 0.1 MG tablet Commonly known as: CATAPRES Take 0.2 mg by mouth 2 (two) times daily.   clopidogrel 75 MG tablet Commonly known as: PLAVIX Take 1 tablet (75 mg total) by mouth daily.   diclofenac Sodium 1 % Gel Commonly known as: VOLTAREN Apply 1 application topically 4 (four) times daily as needed for pain.   fluticasone 50 MCG/ACT nasal spray Commonly known as: FLONASE Place 1 spray into both nostrils daily.   furosemide 20 MG tablet Commonly known as: LASIX Take 20 mg by mouth 2 (two) times daily.   gabapentin 300 MG capsule Commonly known as: NEURONTIN Take 300 mg by mouth at bedtime.   insulin glargine 100 UNIT/ML injection Commonly known as: LANTUS Inject 0.35 mLs (35 Units total) into the skin 2 (two) times daily. What changed:   how much to take  when to take this  Another medication with the same name was removed. Continue taking  this medication, and follow the directions you see here.   insulin lispro 100 UNIT/ML KiwkPen Commonly known as: HUMALOG Inject 10-22 Units into the skin 3 (three) times daily. Breakfast/Lunch  100-150=10units 151-200=15units 201-250=20 units  Evening 100-150=12units 151-200=18 units 201-250=22units   Linzess 145 MCG Caps capsule Generic drug: linaclotide Take 145 mcg by mouth as needed (Constipation).   senna-docusate 8.6-50 MG tablet Commonly known as: Senokot-S Take 1 tablet by mouth daily as needed for mild constipation.   Vitamin B-12 2500 MCG Subl Place 1 tablet under the tongue daily.   Vitamin D-3 25 MCG (1000 UT) Caps Take 2 capsules by mouth in the morning and at bedtime.            Durable Medical Equipment  (From admission, onward)         Start     Ordered   06/23/19 0853  For home use only DME 3 n 1  Once     06/23/19 0852          Discharge Assessment: Vitals:   06/24/19 0854 06/24/19 1231  BP: (!) 143/70 (!) 145/71  Pulse: 63 63  Resp:  18  Temp:  (!) 97.5 F (36.4 C)  SpO2:  97%   Skin clean, dry and intact without evidence of skin break down, no evidence of skin tears noted. IV catheter discontinued intact. Site without signs and symptoms of complications - no redness or edema noted  at insertion site, patient denies c/o pain - only slight tenderness at site.  Dressing with slight pressure applied.  D/c Instructions-Education: Discharge instructions given to patient/family with verbalized understanding. D/c education completed with patient/family including follow up instructions, medication list, d/c activities limitations if indicated, with other d/c instructions as indicated by MD - patient able to verbalize understanding, all questions fully answered. Patient instructed to return to ED, call 911, or call MD for any changes in condition.  Patient escorted via [place], and D/C home via private auto.  Erasmo Leventhal, RN 06/24/2019 3:06  PM

## 2019-06-24 NOTE — Progress Notes (Signed)
Occupational Therapy Treatment Patient Details Name: Briana Garrison MRN: [id number] DOB: [birth year] Today's Date: 06/24/2019    History of present illness Pt is a 69 y.o. female admitted 06/21/19 with 5 to 6-day h/o L face, UE and LE numbness; outside tPA window. MRI showed acute lacunar infarct in R thalamus; multiple old lacunar wounds. MRA showed mild bilateral cavernous carotid and right M2 middle cerebral artery atherosclerosis. Pt also c/o some R hand numbness. PMH includes uncontrolled DM, HTN.   OT comments  Pt. Seen for skilled OT treatment. Daughter present and engaged in session.  Provided theraputty and handouts for education and review of fine motor exercises.  Pt. Able to return demo with cues for technique.  Also provided falls prevention handouts for pt. And dtr. To review.  D/c likely later today.  Dtr. Will be with pt. Assisting as needed.    Follow Up Recommendations  Home health OT;Supervision/Assistance - 24 hour    Equipment Recommendations  3 in 1 bedside commode;Other (comment)    Recommendations for Other Services      Precautions / Restrictions Precautions Precautions: Fall Required Braces or Orthoses: Other Brace Other Brace: R wrist splint overnight per OT Restrictions Weight Bearing Restrictions: No       Mobility Bed Mobility Overal bed mobility: Needs Assistance Bed Mobility: Supine to Sit     Supine to sit: Supervision     General bed mobility comments: Use of bed rail  Transfers                      Balance                                           ADL either performed or assessed with clinical judgement   ADL                                               Vision       Perception     Praxis      Cognition Arousal/Alertness: Awake/alert Behavior During Therapy: WFL for tasks assessed/performed Overall Cognitive Status: Within Functional Limits for tasks assessed                                           Exercises Hand Exercises Digit Composite Flexion: AAROM;Both;10 reps;Seated Composite Extension: AAROM;Both;10 reps;Seated Digit Composite Abduction: AAROM;Both;Seated;10 reps Digit Composite Adduction: AAROM;Both;Seated;10 reps Digit Lifts: AAROM;10 reps;Seated Other Exercises Other Exercises: provided yellow theraputty and fine motor and theraputty handouts.  demonstrated and pt. able to return demo with cues for tech. Other Exercises: provided examples of other activities to facilitate with functional use of hands ie: turning pages, deck of cards, opening packages/containers, use of cell phone   Shoulder Instructions       General Comments      Pertinent Vitals/ Pain       Pain Assessment: No/denies pain  Home Living     Available Help at Discharge: Family;Available 24 hours/day Type of Home: House  Prior Functioning/Environment              Frequency  Min 2X/week        Progress Toward Goals  OT Goals(current goals can now be found in the care plan section)  Progress towards OT goals: Progressing toward goals     Plan Discharge plan remains appropriate    Co-evaluation                 AM-PAC OT "6 Clicks" Daily Activity     Outcome Measure   Help from another person eating meals?: A Little Help from another person taking care of personal grooming?: A Little Help from another person toileting, which includes using toliet, bedpan, or urinal?: A Little Help from another person bathing (including washing, rinsing, drying)?: A Little Help from another person to put on and taking off regular upper body clothing?: A Little Help from another person to put on and taking off regular lower body clothing?: A Little 6 Click Score: 18    End of Session    OT Visit Diagnosis: Unsteadiness on feet (R26.81);Other abnormalities of gait and mobility (R26.89);Other  symptoms and signs involving cognitive function;Pain Pain - Right/Left: Right Pain - part of body: Ankle and joints of foot   Activity Tolerance Patient tolerated treatment well   Patient Left in bed;with call bell/phone within reach;with family/visitor present   Nurse Communication          Time: 1610-9604 OT Time Calculation (min): 22 min  Charges: OT General Charges $OT Visit: 1 Visit OT Treatments $Therapeutic Exercise: 8-22 mins  Sonia Baller, [place]   Janice Coffin 06/24/2019, 11:40 AM

## 2019-06-24 NOTE — Discharge Instructions (Signed)
Ischemic Stroke  An ischemic stroke (cerebrovascular accident, or CVA) is the sudden death of brain tissue that occurs when an area of the brain does not get enough oxygen. It is a medical emergency that must be treated right away. An ischemic stroke can cause permanent loss of brain function. This can cause problems with how different parts of your body function. What are the causes? This condition is caused by a decrease of oxygen supply to an area of the brain, which may be the result of:  A small blood clot (embolus) or a buildup of plaque in the blood vessels (atherosclerosis) that blocks blood flow in the brain.  An abnormal heart rhythm (atrial fibrillation).  A blocked or damaged artery in the head or neck. Sometimes the cause of stroke is not known (cryptogenic). What increases the risk? Certain factors may make you more likely to develop this condition. Some of these factors are things that you can change, such as:  Obesity.  Smoking cigarettes.  Taking oral birth control, especially if you also use tobacco.  Physical inactivity.  Excessive alcohol use.  Use of illegal drugs, especially cocaine and methamphetamine. Other risk factors include:  High blood pressure (hypertension).  High cholesterol.  Diabetes mellitus.  Heart disease.  Being African American, Native American, Hispanic, or [place] Native.  Being over age 60.  Family history of stroke.  Previous history of blood clots, stroke, or transient ischemic attack (TIA).  Sickle cell disease.  Being a woman with a history of preeclampsia.  Migraine headache.  Sleep apnea.  Irregular heartbeats, such as atrial fibrillation.  Chronic inflammatory diseases, such as rheumatoid arthritis or lupus.  Blood clotting disorders (hypercoagulable state). What are the signs or symptoms? Symptoms of this condition usually develop suddenly, or you may notice them after waking up from sleep. Symptoms may include  sudden:  Weakness or numbness in your face, arm, or leg, especially on one side of your body.  Trouble walking or difficulty moving your arms or legs.  Loss of balance or coordination.  Confusion.  Slurred speech (dysarthria).  Trouble speaking, understanding speech, or both (aphasia).  Vision changes--such as double vision, blurred vision, or loss of vision--in one or both eyes.  Dizziness.  Nausea and vomiting.  Severe headache with no known cause. The headache is often described as the worst headache ever experienced. If possible, make note of the exact time that you last felt like your normal self and what time your symptoms started. Tell your health care provider. If symptoms come and go, this could be a sign of a warning stroke, or TIA. Get help right away, even if you feel better. How is this diagnosed? This condition may be diagnosed based on:  Your symptoms, your medical history, and a physical exam.  CT scan of the brain.  MRI.  CT angiogram. This test uses a computer to take X-rays of your arteries. A dye may be injected into your blood to show the inside of your blood vessels more clearly.  MRI angiogram. This is a type of MRI that is used to evaluate the blood vessels.  Cerebral angiogram. This test uses X-rays and a dye to show the blood vessels in the brain and neck. You may need to see a health care provider who specializes in stroke care. A stroke specialist can be seen in person or through communication using telephone or television technology (telemedicine). Other tests may also be done to find the cause of the stroke, such   as:  Electrocardiogram (ECG).  Continuous heart monitoring.  Echocardiogram.  Transesophageal echocardiogram (TEE).  Carotid ultrasound.  A scan of the brain circulation.  Blood tests.  Sleep study to check for sleep apnea. How is this treated? Treatment for this condition will depend on the duration, severity, and cause  of your symptoms and on the area of the brain affected. It is very important to get treatment at the first sign of stroke symptoms. Some treatments work better if they are done within 3-6 hours of the onset of stroke symptoms. These initial treatments may include:  Aspirin.  Medicines to control blood pressure.  Medicine given by injection to dissolve the blood clot (thrombolytic).  Treatments given directly to the affected artery to remove or dissolve the blood clot. Other treatment options may include:  Oxygen.  IV fluids.  Medicines to thin the blood (anticoagulants or antiplatelets).  Procedures to increase blood flow. Medicines and changes to your diet may be used to help treat and manage risk factors for stroke, such as diabetes, high cholesterol, and high blood pressure. After a stroke, you may work with physical, speech, mental health, or occupational therapists to help you recover. Follow these instructions at home: Medicines  Take over-the-counter and prescription medicines only as told by your health care provider.  If you were told to take a medicine to thin your blood, such as aspirin or an anticoagulant, take it exactly as told by your health care provider. ? Taking too much blood-thinning medicine can cause bleeding. ? If you do not take enough blood-thinning medicine, you will not have the protection that you need against another stroke and other problems.  Understand the side effects of taking anticoagulant medicine. When taking this type of medicine, make sure you: ? Hold pressure over any cuts for longer than usual. ? Tell your dentist and other health care providers that you are taking anticoagulants before you have any procedures that may cause bleeding. ? Avoid activities that may cause trauma or injury. Eating and drinking  Follow instructions from your health care provider about diet.  Eat healthy foods.  If your ability to swallow was affected by the  stroke, you may need to take steps to avoid choking, such as: ? Taking small bites when eating. ? Eating foods that are soft or pureed. Safety  Follow instructions from your health care team about physical activity.  Use a walker or cane as told by your health care provider.  Take steps to create a safe home environment in order to reduce the risk of falls. This may include: ? Having your home looked at by specialists. ? Installing grab bars in the bedroom and bathroom. ? Using safety equipment, such as raised toilets and a seat in the shower. General instructions  Do not use any tobacco products, such as cigarettes, chewing tobacco, and e-cigarettes. If you need help quitting, ask your health care provider.  Limit alcohol intake to no more than 1 drink a day for nonpregnant women and 2 drinks a day for men. One drink equals 12 oz of beer, 5 oz of wine, or 1 oz of hard liquor.  If you need help to stop using drugs or alcohol, ask your health care provider about a referral to a program or specialist.  Maintain an active and healthy lifestyle. Get regular exercise as told by your health care provider.  Keep all follow-up visits as told by your health care provider, including visits with all specialists on your   health care team. This is important. How is this prevented? Your risk of another stroke can be decreased by managing high blood pressure, high cholesterol, diabetes, heart disease, sleep apnea, and obesity. It can also be decreased by quitting smoking, limiting alcohol, and staying physically active. Your health care provider will continue to work with you on measures to prevent short-term and long-term complications of stroke. Get help right away if:   You have any symptoms of a stroke. "BE FAST" is an easy way to remember the main warning signs of a stroke: ? B - Balance. Signs are dizziness, sudden trouble walking, or loss of balance. ? E - Eyes. Signs are trouble seeing or a  sudden change in vision. ? F - Face. Signs are sudden weakness or numbness of the face, or the face or eyelid drooping on one side. ? A - Arms. Signs are weakness or numbness in an arm. This happens suddenly and usually on one side of the body. ? S - Speech. Signs are sudden trouble speaking, slurred speech, or trouble understanding what people say. ? T - Time. Time to call emergency services. Write down what time symptoms started.  You have other signs of a stroke, such as: ? A sudden, severe headache with no known cause. ? Nausea or vomiting. ? Seizure.  These symptoms may represent a serious problem that is an emergency. Do not wait to see if the symptoms will go away. Get medical help right away. Call your local emergency services (911 in the [place] Do not drive yourself to the hospital. Summary  An ischemic stroke (cerebrovascular accident, or CVA) is the sudden death of brain tissue that occurs when an area of the brain does not get enough oxygen.  Symptoms of this condition usually develop suddenly, or you may notice them after waking up from sleep.  It is very important to get treatment at the first sign of stroke symptoms. Stroke is a medical emergency that must be treated right away. This information is not intended to replace advice given to you by your health care provider. Make sure you discuss any questions you have with your health care provider. Document Revised: 10/02/2017 Document Reviewed: 04/11/2015 Elsevier Patient Education  2020 [employer]  

## 2019-06-24 NOTE — Care Management (Addendum)
Patient from home with spouse and daughter.  Briana Garrison accepted referral.  Briana Garrison

## 2019-06-24 NOTE — Evaluation (Signed)
Speech Language Pathology Evaluation Patient Details Name: Briana Garrison MRN: [id number] DOB: 02-03-1950 Today's Date: 06/24/2019 Time: 2992-4268 SLP Time Calculation (min) (ACUTE ONLY): 10 min  Problem List:  Patient Active Problem List   Diagnosis Date Noted  . Acute CVA (cerebrovascular accident) ([place]) 06/21/2019  . History of renal cell carcinoma 06/21/2019  . Left ventricular hypertrophy 11/26/2018  . CKD stage G3b/A3, GFR 30-44 and albumin creatinine ratio >300 mg/g 11/26/2018  . Change in stool 08/03/2018  . Choledocholithiasis with obstruction 02/07/2017  . AKI (acute kidney injury) ([place]) 02/07/2017  . Acute kidney injury superimposed on CKD ([place]) 06/23/2011  . Hypoglycemia associated with diabetes ([place]) 06/23/2011  . Type 2 diabetes mellitus with hyperlipidemia ([place]) 06/23/2011  . Essential hypertension 06/23/2011  . Hyperlipidemia 06/23/2011  . Normocytic anemia 06/23/2011  . UTI (lower urinary tract infection) 06/23/2011   Past Medical History:  Past Medical History:  Diagnosis Date  . Cancer of kidney ([place]) 05/14/2009  . Diabetes mellitus   . Endometrial mass   . GERD (gastroesophageal reflux disease)   . Hypercholesterolemia   . Hypertension   . Internal hemorrhoids    S/P colonoscopy 06/10/00 by Dr. Lajoyce Corners  . Shingles    Past Surgical History:  Past Surgical History:  Procedure Laterality Date  . BIOPSY  09/06/2018   Procedure: BIOPSY;  Surgeon: Rogene Houston, MD;  Location: [facility];  Service: Endoscopy;;  . CHOLECYSTECTOMY  11/13/08  . COLONOSCOPY N/A 09/06/2018   Procedure: COLONOSCOPY;  Surgeon: Rogene Houston, MD;  Location: [facility];  Service: Endoscopy;  Laterality: N/A;  730  . CYSTOSCOPY KIDNEY W/ URETERAL GUIDE WIRE  05/14/09  . Diagnostic hysteroscopy, D&C, endometrial polypectomy  07/01/2005  . LIPECTOMY    . POLYPECTOMY  09/06/2018   Procedure: POLYPECTOMY;  Surgeon: Rogene Houston, MD;  Location: [facility];  Service: Endoscopy;;    HPI:  Briana Garrison is a 69 y.o. female with history of uncontrolled diabetes, hypertension, hypercholesterolemia presenting with 5-6d hx of L face, arm and leg decreased sensation. R thalamic lacunar infarct secondary to small vessel disease source   Assessment / Plan / Recommendation Clinical Impression  Pt presents with a mild dysarthria with distortions and imprecise articulation. No focal lingual weakness identified. Provided compensatory strategies for mild dysarthria, modeled overarticualtion, increased volume and pauses to eliminate perception of slurred speech. Pt able to apply strategies to counting, repeated phrases and some conversation with increased cueing needed.  Provided simple strates to help patient prepare her listener. Daughter present for session. Agree with home health f/u. WIll sign off acutely.     SLP Assessment  SLP Recommendation/Assessment: All further Speech Lanaguage Pathology  needs can be addressed in the next venue of care SLP Visit Diagnosis: Dysarthria and anarthria (R47.1)    Follow Up Recommendations  Home health SLP    Frequency and Duration           SLP Evaluation Cognition  Overall Cognitive Status: Within Functional Limits for tasks assessed Orientation Level: Oriented X4       Comprehension  Auditory Comprehension Overall Auditory Comprehension: Appears within functional limits for tasks assessed Reading Comprehension Reading Status: Within funtional limits    Expression Verbal Expression Overall Verbal Expression: Appears within functional limits for tasks assessed   Oral / Motor  Oral Motor/Sensory Function Overall Oral Motor/Sensory Function: Within functional limits Motor Speech Overall Motor Speech: Impaired Respiration: Within functional limits Phonation: Normal Resonance: Within functional limits Articulation:  Impaired Level of Impairment: Phrase Intelligibility: Intelligible Motor Planning: Witnin functional  limits Motor Speech Errors: Consistent   GO                   Herbie Baltimore, MA CCC-SLP  Acute Rehabilitation Services Pager [phone] Office [phone]  Lynann Beaver 06/24/2019, 10:41 AM

## 2019-06-24 NOTE — Discharge Summary (Addendum)
Physician Discharge Summary  Briana Garrison YCX:[id number] DOB: Jun 03, 1950 DOA: 06/20/2019  PCP: [name], MD  Admit date: 06/20/2019 Discharge date: 06/24/2019  Admitted From: Home Disposition: Home  Recommendations for Outpatient Follow-up:  1. Follow up with PCP in 1-2 weeks 2. Follow with neurology in 1 month 3. Please take both aspirin and Plavix through 07/14/2019 and then discontinue aspirin and continue Plavix. 4. Please obtain BMP/CBC in one week 5. Please follow up on the following pending results:  Home Health: Yes Equipment/Devices: Walker  Discharge Condition: Stable CODE STATUS: Full code Diet recommendation: Cardiac  Subjective: Seen and examined.  Daughter at the bedside.  Patient feels much better.  Sensation is improving and speech has improved significantly.  No other complaint.  Brief/Interim Summary: Briana Garrison a 69 y.o.right-handedfemalewith medical history significant ofhypertension, hyperlipidemia, diabetes mellitus type 2,renal cell carcinomas/ppartial nephrectomy andGERDpresented with complaints of 5 days of left-sided numbness.She reported having numbness sensation that radiated from her face on the left side all the way down to her feet. Associated symptoms included complaints of a headache, some mild left sided weakness, numbness in the right hand, some slurred speech, intermittent difficulty swallowing liquids, nausea, and decreased memory reported to be repeating herself.Upon admission into the emergency department patient was seen to be afebrile with stable hemodynamics.  Patient was noted to be out of the window for TPA.MRI revealed acute lacunar infarct of the right thalamus.  Patient was admitted under hospital service and neurology consulted.  She was on aspirin which was continued.  Plavix was added. MR angiogram of head without contrast shows no emergent large vessel occlusion.  Moderate to severe stenosis of distal cavernous  segment of the right ICA.  Multifocal severe stenosis of the right MCA including the distal M1 segment and proximal M2 segment.  Neurology did not recommend any intervention for stenosis.  She was found to have mixed hyperlipidemia with elevated cholesterol and triglyceride level and low HDL.  She was started on atorvastatin 80 mg p.o. daily.  She was evaluated by PT OT and they recommended walker with home health PT OT.  She was suspected to have obstructive sleep apnea so neurology team recommended smart sleep study which she agreed to.  She was diagnosed with obstructive sleep apnea and CPAP mask was fitted and she has been provided with 1.  She also came in with AKI on CKD stage IIIb which has resolved and her renal function is back to her baseline.  Her blood pressure initially was elevated which is now under control.  Resume home medications.  Her diabetes was uncontrolled.  Hemoglobin A1c 9.1.  She was taking 30 units of Lantus in the morning and 30 5 at night.  She is being discharged at 35 units twice daily and continue sliding scale insulin.  Follow with PCP and neurology.  Per neurology recommendations, patient is being discharged on both aspirin 81 mg and Plavix for 3 weeks and then she will stop aspirin and continue Plavix starting 07/15/2019.  Patient and her daughter verbalized understanding.  Discharge Diagnoses:  Principal Problem:   Acute CVA (cerebrovascular accident) [facility]) Active Problems:   Acute kidney injury superimposed on CKD ([place])   Type 2 diabetes mellitus with hyperlipidemia ([place])   Essential hypertension   Hyperlipidemia   Normocytic anemia   History of renal cell carcinoma    Discharge Instructions   Allergies as of 06/24/2019      Reactions   Amlodipine Swelling   Tramadol Diarrhea, Nausea  And Vomiting      Medication List    TAKE these medications   albuterol 108 (90 Base) MCG/ACT inhaler Commonly known as: VENTOLIN HFA Inhale 2 puffs into the lungs every 4  (four) hours as needed for wheezing or shortness of breath (or coughing).   aspirin EC 81 MG tablet Take 81 mg by mouth daily.   atorvastatin 80 MG tablet Commonly known as: LIPITOR Take 1 tablet (80 mg total) by mouth daily.   carvedilol 12.5 MG tablet Commonly known as: COREG Take 12.5 mg by mouth 2 (two) times daily with a meal.   cloNIDine 0.1 MG tablet Commonly known as: CATAPRES Take 0.2 mg by mouth 2 (two) times daily.   clopidogrel 75 MG tablet Commonly known as: PLAVIX Take 1 tablet (75 mg total) by mouth daily.   diclofenac Sodium 1 % Gel Commonly known as: VOLTAREN Apply 1 application topically 4 (four) times daily as needed for pain.   fluticasone 50 MCG/ACT nasal spray Commonly known as: FLONASE Place 1 spray into both nostrils daily.   furosemide 20 MG tablet Commonly known as: LASIX Take 20 mg by mouth 2 (two) times daily.   gabapentin 300 MG capsule Commonly known as: NEURONTIN Take 300 mg by mouth at bedtime.   insulin glargine 100 UNIT/ML injection Commonly known as: LANTUS Inject 0.35 mLs (35 Units total) into the skin 2 (two) times daily. What changed:   how much to take  when to take this  Another medication with the same name was removed. Continue taking this medication, and follow the directions you see here.   insulin lispro 100 UNIT/ML KiwkPen Commonly known as: HUMALOG Inject 10-22 Units into the skin 3 (three) times daily. Breakfast/Lunch  100-150=10units 151-200=15units 201-250=20 units  Evening 100-150=12units 151-200=18 units 201-250=22units   Linzess 145 MCG Caps capsule Generic drug: linaclotide Take 145 mcg by mouth as needed (Constipation).   senna-docusate 8.6-50 MG tablet Commonly known as: Senokot-S Take 1 tablet by mouth daily as needed for mild constipation.   Vitamin B-12 2500 MCG Subl Place 1 tablet under the tongue daily.   Vitamin D-3 25 MCG (1000 UT) Caps Take 2 capsules by mouth in the morning and at  bedtime.            Durable Medical Equipment  (From admission, onward)         Start     Ordered   06/23/19 0853  For home use only DME 3 n 1  Once     06/23/19 6387         Follow-up Information    [name], MD Follow up in 1 week(s).   Specialty: Internal Medicine Contact information: [address] [place] [address] [phone]        Garvin Fila, MD Follow up in 1 month(s).   Specialties: Neurology, Radiology Contact information: [address] [place] [address] [phone]          Allergies  Allergen Reactions  . Amlodipine Swelling  . Tramadol Diarrhea and Nausea And Vomiting    Consultations: Neurology   Procedures/Studies: DG Chest 2 View  Result Date: 06/21/2019 CLINICAL DATA:  Left-sided numbness.  Left leg weakness. EXAM: CHEST - 2 VIEW COMPARISON:  06/07/2017 FINDINGS: Cardiac silhouette is normal in size. No mediastinal or hilar masses. No evidence of adenopathy. Clear lungs.  No pleural effusion or pneumothorax. Skeletal structures are intact. IMPRESSION: No active cardiopulmonary disease. Electronically Signed   By:  Lajean Manes M.D.   On: 06/21/2019 10:08   MR ANGIO HEAD WO CONTRAST  Result Date: 06/21/2019 CLINICAL DATA:  Stroke follow-up EXAM: MRA HEAD WITHOUT CONTRAST TECHNIQUE: Angiographic images of the Circle of Willis were obtained using MRA technique without intravenous contrast. COMPARISON:  Brain MRI 06/21/2019 FINDINGS: POSTERIOR CIRCULATION: --Vertebral arteries: Normal V4 segments. --Inferior cerebellar arteries: Normal. --Basilar artery: Normal. --Superior cerebellar arteries: Normal. --Posterior cerebral arteries: Normal. ANTERIOR CIRCULATION: --Intracranial internal carotid arteries: Moderate-to-severe stenosis of the distal cavernous segment of the right ICA. --Anterior cerebral arteries (ACA): Normal. Both A1 segments are present. Patent anterior communicating artery (a-comm).  --Middle cerebral arteries (MCA): Multifocal severe stenosis of the right MCA including the distal M1 segment and proximal M2. Normal left MCA. IMPRESSION: 1. No emergent large vessel occlusion. 2. Moderate-to-severe stenosis of the distal cavernous segment of the right ICA. 3. Multifocal severe stenosis of the right MCA including the distal M1 segment and proximal M2. Electronically Signed   By: Ulyses Jarred M.D.   On: 06/21/2019 22:37   MR BRAIN WO CONTRAST  Result Date: 06/21/2019 CLINICAL DATA:  Left-sided numbness and weakness EXAM: MRI HEAD WITHOUT CONTRAST TECHNIQUE: Multiplanar, multiecho pulse sequences of the brain and surrounding structures were obtained without intravenous contrast. COMPARISON:  None. FINDINGS: Brain: Ovoid acute infarct in the right thalamus measuring 1 cm. Remote bilateral thalamic and deep gray matter infarcts. Remote infarct in the pons with wallerian changes crossing the pons and extending into the middle cerebellar peduncles. Post ischemic wallerian changes also across the anterior corpus callosum body. No acute hemorrhage, hydrocephalus, collection, or masslike finding. Vascular: Normal flow voids. Skull and upper cervical spine: Normal marrow signal Sinuses/Orbits: Retention cysts in the maxillary and left sphenoid sinuses with mucosal thickening. Bilateral cataract resection. Partial right mastoid opacification with negative nasopharynx. IMPRESSION: 1. Acute lacunar infarct in the right thalamus. 2. Chronic small vessel ischemia including multiple remote lacunar infarcts. Electronically Signed   By: Monte Fantasia M.D.   On: 06/21/2019 07:34   US RENAL  Result Date: 06/21/2019 CLINICAL DATA:  Acute kidney failure. EXAM: RENAL / URINARY TRACT ULTRASOUND COMPLETE COMPARISON:  October 25, 2018. FINDINGS: Right Kidney: Renal measurements: 11.5 x 4.9 x 5.2 cm = volume: 154 mL . Echogenicity within normal limits. No mass or hydronephrosis visualized. Left Kidney: Renal  measurements: 10.5 x 6.6 x 6.1 cm = volume: 220 mL. Prominent column of Bertin is again noted which is normal variant. Echogenicity within normal limits. No mass or hydronephrosis visualized. Bladder: Appears normal for degree of bladder distention. Other: None. IMPRESSION: No significant renal abnormality is noted. Electronically Signed   By: Marijo Conception M.D.   On: 06/21/2019 12:39   ECHOCARDIOGRAM COMPLETE  Result Date: 06/22/2019    ECHOCARDIOGRAM REPORT   Patient Name:   Briana Garrison Date of Exam: 06/22/2019 Medical Rec #:  [id number]       Height:       61.0 in Accession #:    [id number]      Weight:       197.0 lb Date of Birth:  02/06/1950       BSA:          1.877 m Patient Age:    76 years        BP:           190/77 mmHg Patient Gender: F               HR:  64 bpm. Exam Location:  Inpatient Procedure: 2D Echo, Color Doppler and Cardiac Doppler Indications:    Stroke i163.9  History:        Patient has prior history of Echocardiogram examinations, most                 recent 10/21/2018. Risk Factors:Hypertension, Diabetes and                 Dyslipidemia.  Sonographer:    Raquel Sarna Senior RDCS Referring Phys: [phone] [place]  1. Left ventricular ejection fraction, by estimation, is 60 to 65%. The left ventricle has normal function. The left ventricle has no regional wall motion abnormalities. There is severe concentric left ventricular hypertrophy. Left ventricular diastolic  parameters are consistent with Grade II diastolic dysfunction (pseudonormalization). Elevated left atrial pressure.  2. Right ventricular systolic function is normal. The right ventricular size is normal. Tricuspid regurgitation signal is inadequate for assessing PA pressure.  3. The mitral valve is grossly normal. No evidence of mitral valve regurgitation. No evidence of mitral stenosis.  4. The aortic valve is tricuspid. Aortic valve regurgitation is not visualized. Mild aortic valve sclerosis is  present, with no evidence of aortic valve stenosis.  5. The inferior vena cava is normal in size with <50% respiratory variability, suggesting right atrial pressure of 8 mmHg. Comparison(s): No significant change from prior study. Conclusion(s)/Recommendation(s): No intracardiac source of embolism detected on this transthoracic study. A transesophageal echocardiogram is recommended to exclude cardiac source of embolism if clinically indicated. FINDINGS  Left Ventricle: Left ventricular ejection fraction, by estimation, is 60 to 65%. The left ventricle has normal function. The left ventricle has no regional wall motion abnormalities. The left ventricular internal cavity size was normal in size. There is  severe concentric left ventricular hypertrophy. Left ventricular diastolic parameters are consistent with Grade II diastolic dysfunction (pseudonormalization). Elevated left atrial pressure. Right Ventricle: The right ventricular size is normal. No increase in right ventricular wall thickness. Right ventricular systolic function is normal. Tricuspid regurgitation signal is inadequate for assessing PA pressure. Left Atrium: Left atrial size was normal in size. Right Atrium: Right atrial size was normal in size. Pericardium: Trivial pericardial effusion is present. The pericardial effusion is circumferential. Presence of pericardial fat pad. Mitral Valve: The mitral valve is grossly normal. No evidence of mitral valve regurgitation. No evidence of mitral valve stenosis. Tricuspid Valve: The tricuspid valve is grossly normal. Tricuspid valve regurgitation is not demonstrated. No evidence of tricuspid stenosis. Aortic Valve: The aortic valve is tricuspid. Aortic valve regurgitation is not visualized. Mild aortic valve sclerosis is present, with no evidence of aortic valve stenosis. Pulmonic Valve: The pulmonic valve was grossly normal. Pulmonic valve regurgitation is not visualized. No evidence of pulmonic stenosis.  Aorta: The aortic root and ascending aorta are structurally normal, with no evidence of dilitation. Venous: The inferior vena cava is normal in size with less than 50% respiratory variability, suggesting right atrial pressure of 8 mmHg. IAS/Shunts: The atrial septum is grossly normal.  LEFT VENTRICLE PLAX 2D LVIDd:         3.20 cm  Diastology LVIDs:         2.00 cm  LV e' lateral:   3.81 cm/s LV PW:         1.70 cm  LV E/e' lateral: 26.5 LV IVS:        1.70 cm  LV e' medial:    4.68 cm/s LVOT diam:  1.80 cm  LV E/e' medial:  21.6 LV SV:         48 LV SV Index:   25 LVOT Area:     2.54 cm  RIGHT VENTRICLE RV S prime:     10.70 cm/s TAPSE (M-mode): 1.8 cm LEFT ATRIUM           Index       RIGHT ATRIUM          Index LA diam:      3.30 cm 1.76 cm/m  RA Area:     9.83 cm LA Vol (A2C): 40.5 ml 21.58 ml/m RA Volume:   17.60 ml 9.38 ml/m LA Vol (A4C): 46.1 ml 24.56 ml/m  AORTIC VALVE LVOT Vmax:   80.30 cm/s LVOT Vmean:  58.500 cm/s LVOT VTI:    0.187 m  AORTA Ao Root diam: 2.90 cm Ao Asc diam:  3.00 cm MITRAL VALVE MV Area (PHT): 2.39 cm     SHUNTS MV Decel Time: 317 msec     Systemic VTI:  0.19 m MV E velocity: 101.00 cm/s  Systemic Diam: 1.80 cm MV A velocity: 142.00 cm/s MV E/A ratio:  0.71 Eleonore Chiquito MD Electronically signed by Eleonore Chiquito MD Signature Date/Time: 06/22/2019/2:01:46 PM    Final    VAS US CAROTID  Result Date: 06/23/2019 Carotid Arterial Duplex Study Indications:       CVA. Risk Factors:      Hypertension, hyperlipidemia, Diabetes. Comparison Study:  no prior Performing Technologist: Abram Sander RVS  Examination Guidelines: A complete evaluation includes B-mode imaging, spectral Doppler, color Doppler, and power Doppler as needed of all accessible portions of each vessel. Bilateral testing is considered an integral part of a complete examination. Limited examinations for reoccurring indications may be performed as noted.  Right Carotid Findings:  +----------+--------+--------+--------+------------------+--------+           PSV cm/sEDV cm/sStenosisPlaque DescriptionComments +----------+--------+--------+--------+------------------+--------+ CCA Prox  54      12              heterogenous               +----------+--------+--------+--------+------------------+--------+ CCA Distal44      12              heterogenous               +----------+--------+--------+--------+------------------+--------+ ICA Prox  69      11      1-39%   heterogenous               +----------+--------+--------+--------+------------------+--------+ ICA Distal63      23                                         +----------+--------+--------+--------+------------------+--------+ ECA       65                                                 +----------+--------+--------+--------+------------------+--------+ +----------+--------+-------+--------+-------------------+           PSV cm/sEDV cmsDescribeArm Pressure (mmHG) +----------+--------+-------+--------+-------------------+ [id number]                                         +----------+--------+-------+--------+-------------------+ +---------+--------+--+--------+-+---------+  VertebralPSV cm/s25EDV cm/s6Antegrade +---------+--------+--+--------+-+---------+  Left Carotid Findings: +----------+--------+--------+--------+------------------+--------+           PSV cm/sEDV cm/sStenosisPlaque DescriptionComments +----------+--------+--------+--------+------------------+--------+ CCA Prox  82      10              heterogenous               +----------+--------+--------+--------+------------------+--------+ CCA Distal59      13              heterogenous               +----------+--------+--------+--------+------------------+--------+ ICA Prox  39      13      1-39%   heterogenous               +----------+--------+--------+--------+------------------+--------+  ICA Distal54      13                                         +----------+--------+--------+--------+------------------+--------+ ECA       49      7                                          +----------+--------+--------+--------+------------------+--------+ +----------+--------+--------+--------+-------------------+           PSV cm/sEDV cm/sDescribeArm Pressure (mmHG) +----------+--------+--------+--------+-------------------+ [id number]                                          +----------+--------+--------+--------+-------------------+ +---------+--------+--+--------+--+---------+ VertebralPSV cm/s41EDV cm/s12Antegrade +---------+--------+--+--------+--+---------+   Summary: Right Carotid: Velocities in the right ICA are consistent with a 1-39% stenosis. Left Carotid: Velocities in the left ICA are consistent with a 1-39% stenosis. Vertebrals: Bilateral vertebral arteries demonstrate antegrade flow. *See table(s) above for measurements and observations.  Electronically signed by Antony Contras MD on 06/23/2019 at 2:37:26 PM.    Final       Discharge Exam: Vitals:   06/24/19 0003 06/24/19 0436  BP: 115/60 130/61  Pulse: 65 67  Resp: 17 18  Temp: 98.7 F (37.1 C) 97.8 F (36.6 C)  SpO2: 98% 95%   Vitals:   06/23/19 2012 06/23/19 2020 06/24/19 0003 06/24/19 0436  BP:  (!) 184/77 115/60 130/61  Pulse: 63  65 67  Resp: 17  17 18   Temp: 97.7 F (36.5 C)  98.7 F (37.1 C) 97.8 F (36.6 C)  TempSrc: Oral  Oral Oral  SpO2: 100%  98% 95%  Weight:      Height:        General: Pt is alert, awake, not in acute distress Cardiovascular: RRR, S1/S2 +, no rubs, no gallops Respiratory: CTA bilaterally, no wheezing, no rhonchi Abdominal: Soft, NT, ND, bowel sounds + Extremities: no edema, no cyanosis Neuro exam: Very mild dysarthria.  Slightly diminished sensation on the left lower extremity.    The results of significant diagnostics from this hospitalization  (including imaging, microbiology, ancillary and laboratory) are listed below for reference.     Microbiology: Recent Results (from the past 240 hour(s))  SARS Coronavirus 2 by RT PCR (hospital order, performed in [facility] hospital lab) Nasopharyngeal Nasopharyngeal Swab     Status: None   Collection Time: 06/21/19  8:29 AM   Specimen: Nasopharyngeal  Swab  Result Value Ref Range Status   SARS Coronavirus 2 NEGATIVE NEGATIVE Final    Comment: (NOTE) SARS-CoV-2 target nucleic acids are NOT DETECTED. The SARS-CoV-2 RNA is generally detectable in upper and lower respiratory specimens during the acute phase of infection. The lowest concentration of SARS-CoV-2 viral copies this assay can detect is 250 copies / mL. A negative result does not preclude SARS-CoV-2 infection and should not be used as the sole basis for treatment or other patient management decisions.  A negative result may occur with improper specimen collection / handling, submission of specimen other than nasopharyngeal swab, presence of viral mutation(s) within the areas targeted by this assay, and inadequate number of viral copies (<250 copies / mL). A negative result must be combined with clinical observations, patient history, and epidemiological information. Fact Sheet for Patients:   StrictlyIdeas.no Fact Sheet for Healthcare Providers: BankingDealers.co.za This test is not yet approved or cleared  by the [place] FDA and has been authorized for detection and/or diagnosis of SARS-CoV-2 by FDA under an Emergency Use Authorization (EUA).  This EUA will remain in effect (meaning this test can be used) for the duration of the COVID-19 declaration under Section 564(b)(1) of the Act, 21 U.S.C. section 360bbb-3(b)(1), unless the authorization is terminated or revoked sooner. Performed at [facility], [place] [address], [place], [place] [address]      Labs: BNP  (last 3 results) No results for input(s): BNP in the last 8760 hours. Basic Metabolic Panel: Recent Labs  Lab 06/20/19 1612 06/20/19 1642 06/22/19 0450 06/23/19 0816  NA 138 139 136 137  K 4.3 4.1 4.3 4.0  CL 101 105 105 107  CO2 24  --  23 22  GLUCOSE 76 71 272* 161*  BUN 53* 50* 40* 33*  CREATININE 2.31* 2.50* 1.73* 1.78*  CALCIUM 9.6  --  9.1 9.6   Liver Function Tests: Recent Labs  Lab 06/20/19 1612  AST 14*  ALT 16  ALKPHOS 83  BILITOT 0.5  PROT 7.2  ALBUMIN 3.5   No results for input(s): LIPASE, AMYLASE in the last 168 hours. No results for input(s): AMMONIA in the last 168 hours. CBC: Recent Labs  Lab 06/20/19 1612 06/20/19 1642 06/22/19 0450 06/23/19 0816  WBC 8.6  --  5.1 5.8  NEUTROABS 5.1  --   --  3.1  HGB 11.9* 11.9* 11.4* 11.2*  HCT 37.5 35.0* 35.2* 34.6*  MCV 89.3  --  88.7 87.4  PLT 319  --  293 292   Cardiac Enzymes: No results for input(s): CKTOTAL, CKMB, CKMBINDEX, TROPONINI in the last 168 hours. BNP: Invalid input(s): POCBNP CBG: Recent Labs  Lab 06/23/19 0838 06/23/19 1119 06/23/19 1625 06/23/19 2131 06/24/19 0609  GLUCAP 147* 209* 125* 82 87   D-Dimer No results for input(s): DDIMER in the last 72 hours. Hgb A1c Recent Labs    06/21/19 1115  HGBA1C 9.1*   Lipid Profile Recent Labs    06/21/19 1115  CHOL 279*  HDL 38*  LDLCALC 169*  TRIG 359*  CHOLHDL 7.3   Thyroid function studies No results for input(s): TSH, T4TOTAL, T3FREE, THYROIDAB in the last 72 hours.  Invalid input(s): FREET3 Anemia work up No results for input(s): VITAMINB12, FOLATE, FERRITIN, TIBC, IRON, RETICCTPCT in the last 72 hours. Urinalysis    Component Value Date/Time   COLORURINE YELLOW 06/21/2019 1559   APPEARANCEUR CLOUDY (A) 06/21/2019 1559   LABSPEC 1.015 06/21/2019 1559   PHURINE 5.0 06/21/2019 1559  GLUCOSEU NEGATIVE 06/21/2019 1559   HGBUR SMALL (A) 06/21/2019 1559   BILIRUBINUR NEGATIVE 06/21/2019 1559   KETONESUR NEGATIVE  06/21/2019 1559   PROTEINUR 100 (A) 06/21/2019 1559   UROBILINOGEN 0.2 06/23/2011 1100   NITRITE NEGATIVE 06/21/2019 1559   LEUKOCYTESUR LARGE (A) 06/21/2019 1559   Sepsis Labs Invalid input(s): PROCALCITONIN,  WBC,  LACTICIDVEN Microbiology Recent Results (from the past 240 hour(s))  SARS Coronavirus 2 by RT PCR (hospital order, performed in [facility] hospital lab) Nasopharyngeal Nasopharyngeal Swab     Status: None   Collection Time: 06/21/19  8:29 AM   Specimen: Nasopharyngeal Swab  Result Value Ref Range Status   SARS Coronavirus 2 NEGATIVE NEGATIVE Final    Comment: (NOTE) SARS-CoV-2 target nucleic acids are NOT DETECTED. The SARS-CoV-2 RNA is generally detectable in upper and lower respiratory specimens during the acute phase of infection. The lowest concentration of SARS-CoV-2 viral copies this assay can detect is 250 copies / mL. A negative result does not preclude SARS-CoV-2 infection and should not be used as the sole basis for treatment or other patient management decisions.  A negative result may occur with improper specimen collection / handling, submission of specimen other than nasopharyngeal swab, presence of viral mutation(s) within the areas targeted by this assay, and inadequate number of viral copies (<250 copies / mL). A negative result must be combined with clinical observations, patient history, and epidemiological information. Fact Sheet for Patients:   StrictlyIdeas.no Fact Sheet for Healthcare Providers: BankingDealers.co.za This test is not yet approved or cleared  by the [place] FDA and has been authorized for detection and/or diagnosis of SARS-CoV-2 by FDA under an Emergency Use Authorization (EUA).  This EUA will remain in effect (meaning this test can be used) for the duration of the COVID-19 declaration under Section 564(b)(1) of the Act, 21 U.S.C. section 360bbb-3(b)(1), unless the  authorization is terminated or revoked sooner. Performed at [facility], [place] [address] [place], [place] [address]      Time coordinating discharge: Over 30 minutes  SIGNED:   Darliss Cheney, MD  [facility] 06/24/2019, 8:49 AM  If 7PM-7AM, please contact night-coverage [email or web address]

## 2019-06-28 DIAGNOSIS — I1310 Hypertensive heart and chronic kidney disease without heart failure, with stage 1 through stage 4 chronic kidney disease, or unspecified chronic kidney disease: Secondary | ICD-10-CM | POA: Diagnosis not present

## 2019-06-28 DIAGNOSIS — N1832 Chronic kidney disease, stage 3b: Secondary | ICD-10-CM | POA: Diagnosis not present

## 2019-06-28 DIAGNOSIS — I69354 Hemiplegia and hemiparesis following cerebral infarction affecting left non-dominant side: Secondary | ICD-10-CM | POA: Diagnosis not present

## 2019-06-28 DIAGNOSIS — D631 Anemia in chronic kidney disease: Secondary | ICD-10-CM | POA: Diagnosis not present

## 2019-06-28 DIAGNOSIS — E1122 Type 2 diabetes mellitus with diabetic chronic kidney disease: Secondary | ICD-10-CM | POA: Diagnosis not present

## 2019-06-28 DIAGNOSIS — E1140 Type 2 diabetes mellitus with diabetic neuropathy, unspecified: Secondary | ICD-10-CM | POA: Diagnosis not present

## 2019-06-28 DIAGNOSIS — E1165 Type 2 diabetes mellitus with hyperglycemia: Secondary | ICD-10-CM | POA: Diagnosis not present

## 2019-06-28 DIAGNOSIS — G4733 Obstructive sleep apnea (adult) (pediatric): Secondary | ICD-10-CM | POA: Diagnosis not present

## 2019-06-28 DIAGNOSIS — E782 Mixed hyperlipidemia: Secondary | ICD-10-CM | POA: Diagnosis not present

## 2019-06-30 DIAGNOSIS — D631 Anemia in chronic kidney disease: Secondary | ICD-10-CM | POA: Diagnosis not present

## 2019-06-30 DIAGNOSIS — N1832 Chronic kidney disease, stage 3b: Secondary | ICD-10-CM | POA: Diagnosis not present

## 2019-06-30 DIAGNOSIS — E782 Mixed hyperlipidemia: Secondary | ICD-10-CM | POA: Diagnosis not present

## 2019-06-30 DIAGNOSIS — E1165 Type 2 diabetes mellitus with hyperglycemia: Secondary | ICD-10-CM | POA: Diagnosis not present

## 2019-06-30 DIAGNOSIS — I69354 Hemiplegia and hemiparesis following cerebral infarction affecting left non-dominant side: Secondary | ICD-10-CM | POA: Diagnosis not present

## 2019-06-30 DIAGNOSIS — E1140 Type 2 diabetes mellitus with diabetic neuropathy, unspecified: Secondary | ICD-10-CM | POA: Diagnosis not present

## 2019-06-30 DIAGNOSIS — I1310 Hypertensive heart and chronic kidney disease without heart failure, with stage 1 through stage 4 chronic kidney disease, or unspecified chronic kidney disease: Secondary | ICD-10-CM | POA: Diagnosis not present

## 2019-06-30 DIAGNOSIS — G4733 Obstructive sleep apnea (adult) (pediatric): Secondary | ICD-10-CM | POA: Diagnosis not present

## 2019-06-30 DIAGNOSIS — E1122 Type 2 diabetes mellitus with diabetic chronic kidney disease: Secondary | ICD-10-CM | POA: Diagnosis not present

## 2019-07-01 DIAGNOSIS — E1165 Type 2 diabetes mellitus with hyperglycemia: Secondary | ICD-10-CM | POA: Diagnosis not present

## 2019-07-01 DIAGNOSIS — G4733 Obstructive sleep apnea (adult) (pediatric): Secondary | ICD-10-CM | POA: Diagnosis not present

## 2019-07-01 DIAGNOSIS — E1140 Type 2 diabetes mellitus with diabetic neuropathy, unspecified: Secondary | ICD-10-CM | POA: Diagnosis not present

## 2019-07-01 DIAGNOSIS — D631 Anemia in chronic kidney disease: Secondary | ICD-10-CM | POA: Diagnosis not present

## 2019-07-01 DIAGNOSIS — I69354 Hemiplegia and hemiparesis following cerebral infarction affecting left non-dominant side: Secondary | ICD-10-CM | POA: Diagnosis not present

## 2019-07-01 DIAGNOSIS — E1122 Type 2 diabetes mellitus with diabetic chronic kidney disease: Secondary | ICD-10-CM | POA: Diagnosis not present

## 2019-07-01 DIAGNOSIS — I1310 Hypertensive heart and chronic kidney disease without heart failure, with stage 1 through stage 4 chronic kidney disease, or unspecified chronic kidney disease: Secondary | ICD-10-CM | POA: Diagnosis not present

## 2019-07-01 DIAGNOSIS — E782 Mixed hyperlipidemia: Secondary | ICD-10-CM | POA: Diagnosis not present

## 2019-07-01 DIAGNOSIS — N1832 Chronic kidney disease, stage 3b: Secondary | ICD-10-CM | POA: Diagnosis not present

## 2019-07-04 DIAGNOSIS — G4733 Obstructive sleep apnea (adult) (pediatric): Secondary | ICD-10-CM | POA: Diagnosis not present

## 2019-07-04 DIAGNOSIS — E1165 Type 2 diabetes mellitus with hyperglycemia: Secondary | ICD-10-CM | POA: Diagnosis not present

## 2019-07-04 DIAGNOSIS — E782 Mixed hyperlipidemia: Secondary | ICD-10-CM | POA: Diagnosis not present

## 2019-07-04 DIAGNOSIS — N1832 Chronic kidney disease, stage 3b: Secondary | ICD-10-CM | POA: Diagnosis not present

## 2019-07-04 DIAGNOSIS — E1140 Type 2 diabetes mellitus with diabetic neuropathy, unspecified: Secondary | ICD-10-CM | POA: Diagnosis not present

## 2019-07-04 DIAGNOSIS — I1310 Hypertensive heart and chronic kidney disease without heart failure, with stage 1 through stage 4 chronic kidney disease, or unspecified chronic kidney disease: Secondary | ICD-10-CM | POA: Diagnosis not present

## 2019-07-04 DIAGNOSIS — I69354 Hemiplegia and hemiparesis following cerebral infarction affecting left non-dominant side: Secondary | ICD-10-CM | POA: Diagnosis not present

## 2019-07-04 DIAGNOSIS — E1122 Type 2 diabetes mellitus with diabetic chronic kidney disease: Secondary | ICD-10-CM | POA: Diagnosis not present

## 2019-07-04 DIAGNOSIS — D631 Anemia in chronic kidney disease: Secondary | ICD-10-CM | POA: Diagnosis not present

## 2019-07-05 DIAGNOSIS — G4733 Obstructive sleep apnea (adult) (pediatric): Secondary | ICD-10-CM | POA: Diagnosis not present

## 2019-07-05 DIAGNOSIS — D631 Anemia in chronic kidney disease: Secondary | ICD-10-CM | POA: Diagnosis not present

## 2019-07-05 DIAGNOSIS — E1122 Type 2 diabetes mellitus with diabetic chronic kidney disease: Secondary | ICD-10-CM | POA: Diagnosis not present

## 2019-07-05 DIAGNOSIS — E782 Mixed hyperlipidemia: Secondary | ICD-10-CM | POA: Diagnosis not present

## 2019-07-05 DIAGNOSIS — E1165 Type 2 diabetes mellitus with hyperglycemia: Secondary | ICD-10-CM | POA: Diagnosis not present

## 2019-07-05 DIAGNOSIS — N1832 Chronic kidney disease, stage 3b: Secondary | ICD-10-CM | POA: Diagnosis not present

## 2019-07-05 DIAGNOSIS — I69354 Hemiplegia and hemiparesis following cerebral infarction affecting left non-dominant side: Secondary | ICD-10-CM | POA: Diagnosis not present

## 2019-07-05 DIAGNOSIS — E1140 Type 2 diabetes mellitus with diabetic neuropathy, unspecified: Secondary | ICD-10-CM | POA: Diagnosis not present

## 2019-07-05 DIAGNOSIS — I1310 Hypertensive heart and chronic kidney disease without heart failure, with stage 1 through stage 4 chronic kidney disease, or unspecified chronic kidney disease: Secondary | ICD-10-CM | POA: Diagnosis not present

## 2019-07-08 DIAGNOSIS — G4733 Obstructive sleep apnea (adult) (pediatric): Secondary | ICD-10-CM | POA: Diagnosis not present

## 2019-07-08 DIAGNOSIS — E1140 Type 2 diabetes mellitus with diabetic neuropathy, unspecified: Secondary | ICD-10-CM | POA: Diagnosis not present

## 2019-07-08 DIAGNOSIS — E1122 Type 2 diabetes mellitus with diabetic chronic kidney disease: Secondary | ICD-10-CM | POA: Diagnosis not present

## 2019-07-08 DIAGNOSIS — I1310 Hypertensive heart and chronic kidney disease without heart failure, with stage 1 through stage 4 chronic kidney disease, or unspecified chronic kidney disease: Secondary | ICD-10-CM | POA: Diagnosis not present

## 2019-07-08 DIAGNOSIS — I69354 Hemiplegia and hemiparesis following cerebral infarction affecting left non-dominant side: Secondary | ICD-10-CM | POA: Diagnosis not present

## 2019-07-08 DIAGNOSIS — E782 Mixed hyperlipidemia: Secondary | ICD-10-CM | POA: Diagnosis not present

## 2019-07-08 DIAGNOSIS — E1165 Type 2 diabetes mellitus with hyperglycemia: Secondary | ICD-10-CM | POA: Diagnosis not present

## 2019-07-08 DIAGNOSIS — N1832 Chronic kidney disease, stage 3b: Secondary | ICD-10-CM | POA: Diagnosis not present

## 2019-07-08 DIAGNOSIS — D631 Anemia in chronic kidney disease: Secondary | ICD-10-CM | POA: Diagnosis not present

## 2019-07-11 DIAGNOSIS — N1832 Chronic kidney disease, stage 3b: Secondary | ICD-10-CM | POA: Diagnosis not present

## 2019-07-11 DIAGNOSIS — E1122 Type 2 diabetes mellitus with diabetic chronic kidney disease: Secondary | ICD-10-CM | POA: Diagnosis not present

## 2019-07-11 DIAGNOSIS — E1140 Type 2 diabetes mellitus with diabetic neuropathy, unspecified: Secondary | ICD-10-CM | POA: Diagnosis not present

## 2019-07-11 DIAGNOSIS — D631 Anemia in chronic kidney disease: Secondary | ICD-10-CM | POA: Diagnosis not present

## 2019-07-11 DIAGNOSIS — I1310 Hypertensive heart and chronic kidney disease without heart failure, with stage 1 through stage 4 chronic kidney disease, or unspecified chronic kidney disease: Secondary | ICD-10-CM | POA: Diagnosis not present

## 2019-07-11 DIAGNOSIS — E1165 Type 2 diabetes mellitus with hyperglycemia: Secondary | ICD-10-CM | POA: Diagnosis not present

## 2019-07-11 DIAGNOSIS — E782 Mixed hyperlipidemia: Secondary | ICD-10-CM | POA: Diagnosis not present

## 2019-07-11 DIAGNOSIS — G4733 Obstructive sleep apnea (adult) (pediatric): Secondary | ICD-10-CM | POA: Diagnosis not present

## 2019-07-11 DIAGNOSIS — I69354 Hemiplegia and hemiparesis following cerebral infarction affecting left non-dominant side: Secondary | ICD-10-CM | POA: Diagnosis not present

## 2019-07-12 DIAGNOSIS — Z8673 Personal history of transient ischemic attack (TIA), and cerebral infarction without residual deficits: Secondary | ICD-10-CM | POA: Diagnosis not present

## 2019-07-12 DIAGNOSIS — Z85528 Personal history of other malignant neoplasm of kidney: Secondary | ICD-10-CM | POA: Diagnosis not present

## 2019-07-12 DIAGNOSIS — E1165 Type 2 diabetes mellitus with hyperglycemia: Secondary | ICD-10-CM | POA: Diagnosis not present

## 2019-07-12 DIAGNOSIS — E7800 Pure hypercholesterolemia, unspecified: Secondary | ICD-10-CM | POA: Diagnosis not present

## 2019-07-12 DIAGNOSIS — G4733 Obstructive sleep apnea (adult) (pediatric): Secondary | ICD-10-CM | POA: Diagnosis not present

## 2019-07-12 DIAGNOSIS — Z8639 Personal history of other endocrine, nutritional and metabolic disease: Secondary | ICD-10-CM | POA: Diagnosis not present

## 2019-07-12 DIAGNOSIS — I10 Essential (primary) hypertension: Secondary | ICD-10-CM | POA: Diagnosis not present

## 2019-07-12 DIAGNOSIS — Z6835 Body mass index (BMI) 35.0-35.9, adult: Secondary | ICD-10-CM | POA: Diagnosis not present

## 2019-07-12 DIAGNOSIS — N1832 Chronic kidney disease, stage 3b: Secondary | ICD-10-CM | POA: Diagnosis not present

## 2019-07-15 DIAGNOSIS — Z8639 Personal history of other endocrine, nutritional and metabolic disease: Secondary | ICD-10-CM | POA: Diagnosis not present

## 2019-07-15 DIAGNOSIS — Z6835 Body mass index (BMI) 35.0-35.9, adult: Secondary | ICD-10-CM | POA: Diagnosis not present

## 2019-07-15 DIAGNOSIS — Z8673 Personal history of transient ischemic attack (TIA), and cerebral infarction without residual deficits: Secondary | ICD-10-CM | POA: Diagnosis not present

## 2019-07-15 DIAGNOSIS — E7800 Pure hypercholesterolemia, unspecified: Secondary | ICD-10-CM | POA: Diagnosis not present

## 2019-07-15 DIAGNOSIS — N1832 Chronic kidney disease, stage 3b: Secondary | ICD-10-CM | POA: Diagnosis not present

## 2019-07-15 DIAGNOSIS — I10 Essential (primary) hypertension: Secondary | ICD-10-CM | POA: Diagnosis not present

## 2019-07-15 DIAGNOSIS — E1165 Type 2 diabetes mellitus with hyperglycemia: Secondary | ICD-10-CM | POA: Diagnosis not present

## 2019-07-15 DIAGNOSIS — Z85528 Personal history of other malignant neoplasm of kidney: Secondary | ICD-10-CM | POA: Diagnosis not present

## 2019-07-19 DIAGNOSIS — I69354 Hemiplegia and hemiparesis following cerebral infarction affecting left non-dominant side: Secondary | ICD-10-CM | POA: Diagnosis not present

## 2019-07-19 DIAGNOSIS — E782 Mixed hyperlipidemia: Secondary | ICD-10-CM | POA: Diagnosis not present

## 2019-07-19 DIAGNOSIS — E1140 Type 2 diabetes mellitus with diabetic neuropathy, unspecified: Secondary | ICD-10-CM | POA: Diagnosis not present

## 2019-07-19 DIAGNOSIS — D631 Anemia in chronic kidney disease: Secondary | ICD-10-CM | POA: Diagnosis not present

## 2019-07-19 DIAGNOSIS — E1122 Type 2 diabetes mellitus with diabetic chronic kidney disease: Secondary | ICD-10-CM | POA: Diagnosis not present

## 2019-07-19 DIAGNOSIS — E1165 Type 2 diabetes mellitus with hyperglycemia: Secondary | ICD-10-CM | POA: Diagnosis not present

## 2019-07-19 DIAGNOSIS — G4733 Obstructive sleep apnea (adult) (pediatric): Secondary | ICD-10-CM | POA: Diagnosis not present

## 2019-07-19 DIAGNOSIS — I1310 Hypertensive heart and chronic kidney disease without heart failure, with stage 1 through stage 4 chronic kidney disease, or unspecified chronic kidney disease: Secondary | ICD-10-CM | POA: Diagnosis not present

## 2019-07-19 DIAGNOSIS — N1832 Chronic kidney disease, stage 3b: Secondary | ICD-10-CM | POA: Diagnosis not present

## 2019-07-21 DIAGNOSIS — E1122 Type 2 diabetes mellitus with diabetic chronic kidney disease: Secondary | ICD-10-CM | POA: Diagnosis not present

## 2019-07-21 DIAGNOSIS — I10 Essential (primary) hypertension: Secondary | ICD-10-CM | POA: Diagnosis not present

## 2019-07-21 DIAGNOSIS — Z8673 Personal history of transient ischemic attack (TIA), and cerebral infarction without residual deficits: Secondary | ICD-10-CM | POA: Diagnosis not present

## 2019-07-21 DIAGNOSIS — N184 Chronic kidney disease, stage 4 (severe): Secondary | ICD-10-CM | POA: Diagnosis not present

## 2019-07-24 ENCOUNTER — Emergency Department (HOSPITAL_COMMUNITY)
Admission: EM | Admit: 2019-07-24 | Discharge: 2019-07-25 | Disposition: A | Discharge status: Home / Self Care | Payer: Medicare PPO | Attending: Emergency Medicine | Admitting: Emergency Medicine

## 2019-07-24 ENCOUNTER — Other Ambulatory Visit: Payer: Self-pay

## 2019-07-24 ENCOUNTER — Encounter (HOSPITAL_COMMUNITY): Payer: Self-pay | Admitting: Emergency Medicine

## 2019-07-24 ENCOUNTER — Emergency Department (HOSPITAL_COMMUNITY): Payer: Medicare PPO

## 2019-07-24 DIAGNOSIS — M79675 Pain in left toe(s): Secondary | ICD-10-CM | POA: Insufficient documentation

## 2019-07-24 DIAGNOSIS — M7989 Other specified soft tissue disorders: Secondary | ICD-10-CM | POA: Diagnosis not present

## 2019-07-24 DIAGNOSIS — N390 Urinary tract infection, site not specified: Secondary | ICD-10-CM | POA: Diagnosis not present

## 2019-07-24 DIAGNOSIS — E1165 Type 2 diabetes mellitus with hyperglycemia: Secondary | ICD-10-CM | POA: Insufficient documentation

## 2019-07-24 DIAGNOSIS — R600 Localized edema: Secondary | ICD-10-CM | POA: Diagnosis not present

## 2019-07-24 DIAGNOSIS — Z794 Long term (current) use of insulin: Secondary | ICD-10-CM | POA: Insufficient documentation

## 2019-07-24 DIAGNOSIS — R739 Hyperglycemia, unspecified: Secondary | ICD-10-CM

## 2019-07-24 DIAGNOSIS — S90122A Contusion of left lesser toe(s) without damage to nail, initial encounter: Secondary | ICD-10-CM | POA: Diagnosis not present

## 2019-07-24 DIAGNOSIS — I10 Essential (primary) hypertension: Secondary | ICD-10-CM | POA: Diagnosis not present

## 2019-07-24 DIAGNOSIS — Z79899 Other long term (current) drug therapy: Secondary | ICD-10-CM | POA: Diagnosis not present

## 2019-07-24 DIAGNOSIS — N179 Acute kidney failure, unspecified: Secondary | ICD-10-CM | POA: Diagnosis not present

## 2019-07-24 NOTE — ED Triage Notes (Signed)
Pt reports hypertension, hyperglycemia and "a broken toe."  Pt's family reports med compliance and believes the pain is related to torn rotator cuff on right arm and "broken toe."  Pts. continuious glucose meter indicated her CBG to be 211.

## 2019-07-25 LAB — BASIC METABOLIC PANEL
Anion gap: 12 (ref 5–15)
BUN: 35 mg/dL — ABNORMAL HIGH (ref 8–23)
CO2: 20 mmol/L — ABNORMAL LOW (ref 22–32)
Calcium: 9.6 mg/dL (ref 8.9–10.3)
Chloride: 103 mmol/L (ref 98–111)
Creatinine, Ser: 1.88 mg/dL — ABNORMAL HIGH (ref 0.44–1.00)
GFR calc Af Amer: 31 mL/min — ABNORMAL LOW (ref >60)
GFR calc non Af Amer: 27 mL/min — ABNORMAL LOW (ref >60)
Glucose, Bld: 256 mg/dL — ABNORMAL HIGH (ref 70–99)
Potassium: 4.7 mmol/L (ref 3.5–5.1)
Sodium: 135 mmol/L (ref 135–145)

## 2019-07-25 LAB — URINALYSIS, ROUTINE W REFLEX MICROSCOPIC
Bilirubin Urine: NEGATIVE
Glucose, UA: 50 mg/dL — AB
Ketones, ur: NEGATIVE mg/dL
Nitrite: NEGATIVE
Protein, ur: 30 mg/dL — AB
Specific Gravity, Urine: 1.009 (ref 1.005–1.030)
WBC, UA: >50 WBC/hpf — ABNORMAL HIGH (ref 0–5)
pH: 5 (ref 5.0–8.0)

## 2019-07-25 LAB — CBC
HCT: 38 % (ref 36.0–46.0)
Hemoglobin: 12.2 g/dL (ref 12.0–15.0)
MCH: 28.7 pg (ref 26.0–34.0)
MCHC: 32.1 g/dL (ref 30.0–36.0)
MCV: 89.4 fL (ref 80.0–100.0)
Platelets: 325 10*3/uL (ref 150–400)
RBC: 4.25 MIL/uL (ref 3.87–5.11)
RDW: 13.7 % (ref 11.5–15.5)
WBC: 8.8 10*3/uL (ref 4.0–10.5)
nRBC: 0 % (ref 0.0–0.2)

## 2019-07-25 LAB — I-STAT VENOUS BLOOD GAS, ED
Acid-base deficit: 3 mmol/L — ABNORMAL HIGH (ref 0.0–2.0)
Bicarbonate: 21.4 mmol/L (ref 20.0–28.0)
Calcium, Ion: 1.21 mmol/L (ref 1.15–1.40)
HCT: 33 % — ABNORMAL LOW (ref 36.0–46.0)
Hemoglobin: 11.2 g/dL — ABNORMAL LOW (ref 12.0–15.0)
O2 Saturation: 99 %
Potassium: 4.4 mmol/L (ref 3.5–5.1)
Sodium: 134 mmol/L — ABNORMAL LOW (ref 135–145)
TCO2: 23 mmol/L (ref 22–32)
pCO2, Ven: 36.7 mmHg — ABNORMAL LOW (ref 44.0–60.0)
pH, Ven: 7.375 (ref 7.250–7.430)
pO2, Ven: 120 mmHg — ABNORMAL HIGH (ref 32.0–45.0)

## 2019-07-25 LAB — CBG MONITORING, ED
Glucose-Capillary: 239 mg/dL — ABNORMAL HIGH (ref 70–99)
Glucose-Capillary: 254 mg/dL — ABNORMAL HIGH (ref 70–99)

## 2019-07-25 MED ORDER — CEPHALEXIN 500 MG PO CAPS
500.0000 mg | ORAL_CAPSULE | Freq: Two times a day (BID) | ORAL | 0 refills | Status: DC
Start: 2019-07-25 — End: 2019-10-06

## 2019-07-25 MED ORDER — INSULIN GLARGINE 100 UNIT/ML ~~LOC~~ SOLN
10.0000 [IU] | Freq: Once | SUBCUTANEOUS | Status: AC
  Administered 2019-07-25: 10 [IU] via SUBCUTANEOUS
  Filled 2019-07-25: qty 0.1

## 2019-07-25 MED ORDER — CLONIDINE HCL 0.2 MG PO TABS
0.2000 mg | ORAL_TABLET | Freq: Once | ORAL | Status: AC
  Administered 2019-07-25: 0.2 mg via ORAL
  Filled 2019-07-25: qty 1

## 2019-07-25 MED ORDER — CEPHALEXIN 250 MG PO CAPS
500.0000 mg | ORAL_CAPSULE | Freq: Once | ORAL | Status: AC
  Administered 2019-07-25: 500 mg via ORAL
  Filled 2019-07-25: qty 2

## 2019-07-25 MED ORDER — ACETAMINOPHEN 500 MG PO TABS: 1000.0000 mg | ORAL_TABLET | Freq: Once | ORAL | Status: DC

## 2019-07-25 MED ORDER — ACETAMINOPHEN 500 MG PO TABS
1000.0000 mg | ORAL_TABLET | Freq: Once | ORAL | Status: AC
  Administered 2019-07-25: 1000 mg via ORAL
  Filled 2019-07-25: qty 2

## 2019-07-25 NOTE — ED Provider Notes (Signed)
TIME SEEN: 2:37 AM  CHIEF COMPLAINT: Hypertension, hyperglycemia, left second toe pain  HPI: Patient is a 69 year old female with history of insulin-dependent diabetes, hypertension, hyperlipidemia who presents to the emergency department with multiple complaints.  She reports today her blood pressure and blood sugar have been elevated.  She has taken her Humalog but has not taken her nighttime Lantus.  She is on clonidine twice daily and reports medication compliance.  Family concerned that blood pressure and blood sugar elevated due to second left toe pain.  She denies any known injury but it is swollen and bruised.  No fever, redness or warmth.  Patient reports mild headache.  No vision changes.  No numbness or focal weakness.  No chest pain or shortness of breath.  ROS: See HPI Constitutional: no fever  Eyes: no drainage  ENT: no runny nose   Cardiovascular:  no chest pain  Resp: no SOB  GI: no vomiting GU: no dysuria Integumentary: no rash  Allergy: no hives  Musculoskeletal: no leg swelling  Neurological: no slurred speech ROS otherwise negative  PAST MEDICAL HISTORY/PAST SURGICAL HISTORY:  Past Medical History:  Diagnosis Date  . Cancer of kidney (Briana Garrison) 05/14/2009  . Diabetes mellitus   . Endometrial mass   . GERD (gastroesophageal reflux disease)   . Hypercholesterolemia   . Hypertension   . Internal hemorrhoids    S/P colonoscopy 06/10/00 by Dr. Lajoyce Garrison  . Shingles     MEDICATIONS:  Prior to Admission medications   Medication Sig Start Date End Date Taking? Authorizing Provider  acetaminophen (TYLENOL) 500 MG tablet Take 1,000 mg by mouth every 6 (six) hours as needed for mild pain or headache.   Yes [provider]  albuterol (PROVENTIL HFA;VENTOLIN HFA) 108 (90 Base) MCG/ACT inhaler Inhale 2 puffs into the lungs every 4 (four) hours as needed for wheezing or shortness of breath (or coughing). 5/40/08  Yes Delora Fuel, MD  aspirin EC 81 MG tablet Take 81 mg by  mouth daily.   Yes [provider]  carvedilol (COREG) 12.5 MG tablet Take 12.5 mg by mouth 2 (two) times daily with a meal.   Yes [provider]  Cholecalciferol (VITAMIN D-3) 25 MCG (1000 UT) CAPS Take 2 capsules by mouth in the morning and at bedtime.   Yes [provider]  cloNIDine (CATAPRES) 0.1 MG tablet Take 0.2 mg by mouth 2 (two) times daily.    Yes [provider]  clopidogrel (PLAVIX) 75 MG tablet Take 1 tablet (75 mg total) by mouth daily. 06/24/19 09/22/19 Yes Pahwani, Briana Grad, MD  Cyanocobalamin (VITAMIN B-12) 2500 MCG SUBL Place 1 tablet under the tongue daily.   Yes [provider]  diclofenac Sodium (VOLTAREN) 1 % GEL Apply 1 application topically 4 (four) times daily as needed for pain.   Yes [provider]  fluticasone (FLONASE) 50 MCG/ACT nasal spray Place 1 spray into both nostrils daily as needed for allergies or rhinitis.  05/24/19  Yes [provider]  gabapentin (NEURONTIN) 300 MG capsule Take 300 mg by mouth at bedtime.   Yes [provider]  insulin glargine (LANTUS) 100 UNIT/ML injection Inject 0.35 mLs (35 Units total) into the skin 2 (two) times daily. Patient taking differently: Inject 10-18 Units into the skin 2 (two) times daily. 18 in am 10 qhs 06/24/19  Yes Pahwani, Ravi, MD  insulin lispro (HUMALOG) 100 UNIT/ML KiwkPen Inject 10-22 Units into the skin 3 (three) times daily. Breakfast/Lunch  100-150=10units 151-200=15units 201-250=20 units  Evening 100-150=12units 151-200=18 units 201-250=22units   Yes [provider]  senna-docusate (SENOKOT-S) 8.6-50 MG tablet Take 1 tablet by mouth daily as needed for mild constipation.   Yes [provider]  TRULICITY 3.15 QM/0.8QP SOPN Inject 0.5 mLs into the skin once a week. 07/15/19  Yes [provider]  atorvastatin (LIPITOR) 80 MG tablet Take 1 tablet (80 mg total) by mouth daily. 06/24/19 07/24/19  Darliss Cheney, MD     ALLERGIES:  Allergies  Allergen Reactions  . Amlodipine Swelling  . Tramadol Diarrhea and Nausea And Vomiting    SOCIAL HISTORY:  Social History   Tobacco Use  . Smoking status: Never Smoker  . Smokeless tobacco: Never Used  Substance Use Topics  . Alcohol use: No    FAMILY HISTORY: Family History  Problem Relation Age of Onset  . Ovarian cancer Mother   . Stroke Mother   . Stroke Father     EXAM: BP (!) 188/63   Pulse 80   Temp 99.6 F (37.6 C) (Oral)   Resp (!) 29   Ht 5\' 1"  (1.549 m)   Wt 89.4 kg   SpO2 97%   BMI 37.22 kg/m  CONSTITUTIONAL: Alert and oriented and responds appropriately to questions. Well-appearing; well-nourished, in no distress HEAD: Normocephalic, atraumatic EYES: Conjunctivae clear, pupils appear equal, EOM appear intact ENT: normal nose; moist mucous membranes NECK: Supple, normal ROM CARD: RRR; S1 and S2 appreciated; no murmurs, no clicks, no rubs, no gallops RESP: Normal chest excursion without splinting or tachypnea; breath sounds clear and equal bilaterally; no wheezes, no rhonchi, no rales, no hypoxia or respiratory distress, speaking full sentences ABD/GI: Normal bowel sounds; non-distended; soft, non-tender, no rebound, no guarding, no peritoneal signs, no hepatosplenomegaly BACK:  The back appears normal EXT: Normal ROM in all joints; patient has ecchymosis and soft tissue swelling to the second left toe without redness, warmth, induration or fluctuance.  No open wounds.  She does have some soft tissue swelling of the dorsal left foot.  She is a 2+ left DP pulse and normal capillary refill.  No calf tenderness or calf swelling.  Compartments are soft. SKIN: Normal color for age and race; warm; no rash on exposed skin NEURO: Moves all extremities equally, normal sensation diffusely, cranial nerves II through XII intact, normal speech PSYCH: The patient's mood and manner are appropriate.   MEDICAL DECISION MAKING: Patient here with  complaints of left second toe pain.  It appears that it is swollen and bruised.  She appears to have good perfusion however to her foot and no sign of septic arthritis, gout, cellulitis, abscess or other wound on exam.  X-ray shows no acute bony abnormality.  Will provide with Tylenol for discomfort.  She is also concerned because her blood pressure and blood sugar are elevated.  Have given her a dose of her home clonidine and will reassess.  Blood sugar also elevated in the 200s with slightly decreased bicarb but normal anion gap.  Will check pH and check urine for ketones.  Low suspicion for DKA.  ED PROGRESS: No ketones in patient's urine.  Her urine does appear infected.  Will send urine culture.  No previous cultures in our system.  Will start on Keflex.  She reports she was starting have some dysuria.  She told her daughter earlier today that she was concerned she may begin to have a UTI.  Will recheck blood glucose and blood pressure.  Blood sugar has been stable.  VBG  reassuring with normal pH and bicarb.  Blood pressure has improved.  Have advised her to continue her medications as prescribed at home and follow-up with her PCP if blood sugar and blood pressure continue to be elevated.  She reports she has gabapentin at home for pain control and feels this will be enough to manage her pain along with Tylenol.  Will discharge with prescription of Keflex.  Discussed return precautions.  At this time, I do not feel there is any life-threatening condition present. I have reviewed, interpreted and discussed all results (EKG, imaging, lab, urine as appropriate) and exam findings with patient/family. I have reviewed nursing notes and appropriate previous records.  I feel the patient is safe to be discharged home without further emergent workup and can continue workup as an outpatient as needed. Discussed usual and customary return precautions. Patient/family verbalize understanding and are comfortable with  this plan.  Outpatient follow-up has been provided as needed. All questions have been answered.     EKG Interpretation  Date/Time:  Sunday July 24 2019 23:31:43 EDT Ventricular Rate:  76 PR Interval:  168 QRS Duration: 76 QT Interval:  408 QTC Calculation: 459 R Axis:   58 Text Interpretation: Normal sinus rhythm Normal ECG Confirmed by Pryor Curia [phone]) on 07/25/2019 5:43:10 AM          Briana Garrison was evaluated in Emergency Department on 07/25/2019 for the symptoms described in the history of present illness. She was evaluated in the context of the global COVID-19 pandemic, which necessitated consideration that the patient might be at risk for infection with the SARS-CoV-2 virus that causes COVID-19. Institutional protocols and algorithms that pertain to the evaluation of patients at risk for COVID-19 are in a state of rapid change based on information released by regulatory bodies including the CDC and federal and state organizations. These policies and algorithms were followed during the patient's care in the ED.      Evanie Buckle, Delice Bison, DO 07/25/19 [phone]

## 2019-07-25 NOTE — ED Notes (Signed)
Pt given water 

## 2019-07-25 NOTE — ED Notes (Signed)
Post up shoe applied for comfort.  Patient verbalized understanding of dc instructions, vss, ambulatory with nad.

## 2019-07-25 NOTE — Discharge Instructions (Addendum)
You may take Tylenol 1000 mg every 6 hours as needed for pain.  You may take this along with your gabapentin.  Please follow-up with your primary care physician to monitor your blood pressure and blood sugar.  Your labs were reassuring today.  Your x-ray showed no fracture of the left foot.  You do appear to have a urinary tract infection.  Please take your Keflex as prescribed.

## 2019-07-26 LAB — URINE CULTURE

## 2019-07-28 DIAGNOSIS — E782 Mixed hyperlipidemia: Secondary | ICD-10-CM | POA: Diagnosis not present

## 2019-07-28 DIAGNOSIS — D631 Anemia in chronic kidney disease: Secondary | ICD-10-CM | POA: Diagnosis not present

## 2019-07-28 DIAGNOSIS — E1165 Type 2 diabetes mellitus with hyperglycemia: Secondary | ICD-10-CM | POA: Diagnosis not present

## 2019-07-28 DIAGNOSIS — I69354 Hemiplegia and hemiparesis following cerebral infarction affecting left non-dominant side: Secondary | ICD-10-CM | POA: Diagnosis not present

## 2019-07-28 DIAGNOSIS — G4733 Obstructive sleep apnea (adult) (pediatric): Secondary | ICD-10-CM | POA: Diagnosis not present

## 2019-07-28 DIAGNOSIS — E1140 Type 2 diabetes mellitus with diabetic neuropathy, unspecified: Secondary | ICD-10-CM | POA: Diagnosis not present

## 2019-07-28 DIAGNOSIS — E1122 Type 2 diabetes mellitus with diabetic chronic kidney disease: Secondary | ICD-10-CM | POA: Diagnosis not present

## 2019-07-28 DIAGNOSIS — N1832 Chronic kidney disease, stage 3b: Secondary | ICD-10-CM | POA: Diagnosis not present

## 2019-07-28 DIAGNOSIS — S99922A Unspecified injury of left foot, initial encounter: Secondary | ICD-10-CM | POA: Diagnosis not present

## 2019-07-28 DIAGNOSIS — Z8673 Personal history of transient ischemic attack (TIA), and cerebral infarction without residual deficits: Secondary | ICD-10-CM | POA: Diagnosis not present

## 2019-07-28 DIAGNOSIS — I1310 Hypertensive heart and chronic kidney disease without heart failure, with stage 1 through stage 4 chronic kidney disease, or unspecified chronic kidney disease: Secondary | ICD-10-CM | POA: Diagnosis not present

## 2019-08-02 DIAGNOSIS — D631 Anemia in chronic kidney disease: Secondary | ICD-10-CM | POA: Diagnosis not present

## 2019-08-02 DIAGNOSIS — I1310 Hypertensive heart and chronic kidney disease without heart failure, with stage 1 through stage 4 chronic kidney disease, or unspecified chronic kidney disease: Secondary | ICD-10-CM | POA: Diagnosis not present

## 2019-08-02 DIAGNOSIS — E1165 Type 2 diabetes mellitus with hyperglycemia: Secondary | ICD-10-CM | POA: Diagnosis not present

## 2019-08-02 DIAGNOSIS — E1140 Type 2 diabetes mellitus with diabetic neuropathy, unspecified: Secondary | ICD-10-CM | POA: Diagnosis not present

## 2019-08-02 DIAGNOSIS — E782 Mixed hyperlipidemia: Secondary | ICD-10-CM | POA: Diagnosis not present

## 2019-08-02 DIAGNOSIS — I69354 Hemiplegia and hemiparesis following cerebral infarction affecting left non-dominant side: Secondary | ICD-10-CM | POA: Diagnosis not present

## 2019-08-02 DIAGNOSIS — E1122 Type 2 diabetes mellitus with diabetic chronic kidney disease: Secondary | ICD-10-CM | POA: Diagnosis not present

## 2019-08-02 DIAGNOSIS — N1832 Chronic kidney disease, stage 3b: Secondary | ICD-10-CM | POA: Diagnosis not present

## 2019-08-02 DIAGNOSIS — G4733 Obstructive sleep apnea (adult) (pediatric): Secondary | ICD-10-CM | POA: Diagnosis not present

## 2019-08-03 DIAGNOSIS — E1122 Type 2 diabetes mellitus with diabetic chronic kidney disease: Secondary | ICD-10-CM | POA: Diagnosis not present

## 2019-08-03 DIAGNOSIS — N1832 Chronic kidney disease, stage 3b: Secondary | ICD-10-CM | POA: Diagnosis not present

## 2019-08-03 DIAGNOSIS — E1140 Type 2 diabetes mellitus with diabetic neuropathy, unspecified: Secondary | ICD-10-CM | POA: Diagnosis not present

## 2019-08-03 DIAGNOSIS — E1165 Type 2 diabetes mellitus with hyperglycemia: Secondary | ICD-10-CM | POA: Diagnosis not present

## 2019-08-03 DIAGNOSIS — I69354 Hemiplegia and hemiparesis following cerebral infarction affecting left non-dominant side: Secondary | ICD-10-CM | POA: Diagnosis not present

## 2019-08-03 DIAGNOSIS — E782 Mixed hyperlipidemia: Secondary | ICD-10-CM | POA: Diagnosis not present

## 2019-08-03 DIAGNOSIS — D631 Anemia in chronic kidney disease: Secondary | ICD-10-CM | POA: Diagnosis not present

## 2019-08-03 DIAGNOSIS — G4733 Obstructive sleep apnea (adult) (pediatric): Secondary | ICD-10-CM | POA: Diagnosis not present

## 2019-08-03 DIAGNOSIS — I1310 Hypertensive heart and chronic kidney disease without heart failure, with stage 1 through stage 4 chronic kidney disease, or unspecified chronic kidney disease: Secondary | ICD-10-CM | POA: Diagnosis not present

## 2019-08-04 DIAGNOSIS — E1165 Type 2 diabetes mellitus with hyperglycemia: Secondary | ICD-10-CM | POA: Diagnosis not present

## 2019-08-16 DIAGNOSIS — E1122 Type 2 diabetes mellitus with diabetic chronic kidney disease: Secondary | ICD-10-CM | POA: Diagnosis not present

## 2019-08-16 DIAGNOSIS — I1310 Hypertensive heart and chronic kidney disease without heart failure, with stage 1 through stage 4 chronic kidney disease, or unspecified chronic kidney disease: Secondary | ICD-10-CM | POA: Diagnosis not present

## 2019-08-16 DIAGNOSIS — D631 Anemia in chronic kidney disease: Secondary | ICD-10-CM | POA: Diagnosis not present

## 2019-08-16 DIAGNOSIS — N1832 Chronic kidney disease, stage 3b: Secondary | ICD-10-CM | POA: Diagnosis not present

## 2019-08-16 DIAGNOSIS — G4733 Obstructive sleep apnea (adult) (pediatric): Secondary | ICD-10-CM | POA: Diagnosis not present

## 2019-08-16 DIAGNOSIS — E782 Mixed hyperlipidemia: Secondary | ICD-10-CM | POA: Diagnosis not present

## 2019-08-16 DIAGNOSIS — E1165 Type 2 diabetes mellitus with hyperglycemia: Secondary | ICD-10-CM | POA: Diagnosis not present

## 2019-08-16 DIAGNOSIS — I69354 Hemiplegia and hemiparesis following cerebral infarction affecting left non-dominant side: Secondary | ICD-10-CM | POA: Diagnosis not present

## 2019-08-16 DIAGNOSIS — E1140 Type 2 diabetes mellitus with diabetic neuropathy, unspecified: Secondary | ICD-10-CM | POA: Diagnosis not present

## 2019-08-17 DIAGNOSIS — I10 Essential (primary) hypertension: Secondary | ICD-10-CM | POA: Diagnosis not present

## 2019-08-17 DIAGNOSIS — E278 Other specified disorders of adrenal gland: Secondary | ICD-10-CM | POA: Diagnosis not present

## 2019-08-17 DIAGNOSIS — E1165 Type 2 diabetes mellitus with hyperglycemia: Secondary | ICD-10-CM | POA: Diagnosis not present

## 2019-08-17 DIAGNOSIS — E1140 Type 2 diabetes mellitus with diabetic neuropathy, unspecified: Secondary | ICD-10-CM | POA: Diagnosis not present

## 2019-08-24 DIAGNOSIS — I129 Hypertensive chronic kidney disease with stage 1 through stage 4 chronic kidney disease, or unspecified chronic kidney disease: Secondary | ICD-10-CM | POA: Diagnosis not present

## 2019-08-24 DIAGNOSIS — I639 Cerebral infarction, unspecified: Secondary | ICD-10-CM | POA: Diagnosis not present

## 2019-08-24 DIAGNOSIS — E1140 Type 2 diabetes mellitus with diabetic neuropathy, unspecified: Secondary | ICD-10-CM | POA: Diagnosis not present

## 2019-08-24 DIAGNOSIS — N189 Chronic kidney disease, unspecified: Secondary | ICD-10-CM | POA: Diagnosis not present

## 2019-08-24 DIAGNOSIS — I1310 Hypertensive heart and chronic kidney disease without heart failure, with stage 1 through stage 4 chronic kidney disease, or unspecified chronic kidney disease: Secondary | ICD-10-CM | POA: Diagnosis not present

## 2019-08-24 DIAGNOSIS — E1165 Type 2 diabetes mellitus with hyperglycemia: Secondary | ICD-10-CM | POA: Diagnosis not present

## 2019-08-24 DIAGNOSIS — I69354 Hemiplegia and hemiparesis following cerebral infarction affecting left non-dominant side: Secondary | ICD-10-CM | POA: Diagnosis not present

## 2019-08-24 DIAGNOSIS — E785 Hyperlipidemia, unspecified: Secondary | ICD-10-CM | POA: Diagnosis not present

## 2019-08-24 DIAGNOSIS — N2581 Secondary hyperparathyroidism of renal origin: Secondary | ICD-10-CM | POA: Diagnosis not present

## 2019-08-24 DIAGNOSIS — E1122 Type 2 diabetes mellitus with diabetic chronic kidney disease: Secondary | ICD-10-CM | POA: Diagnosis not present

## 2019-08-24 DIAGNOSIS — G4733 Obstructive sleep apnea (adult) (pediatric): Secondary | ICD-10-CM | POA: Diagnosis not present

## 2019-08-24 DIAGNOSIS — Z85528 Personal history of other malignant neoplasm of kidney: Secondary | ICD-10-CM | POA: Diagnosis not present

## 2019-08-24 DIAGNOSIS — E782 Mixed hyperlipidemia: Secondary | ICD-10-CM | POA: Diagnosis not present

## 2019-08-24 DIAGNOSIS — D631 Anemia in chronic kidney disease: Secondary | ICD-10-CM | POA: Diagnosis not present

## 2019-08-24 DIAGNOSIS — N1832 Chronic kidney disease, stage 3b: Secondary | ICD-10-CM | POA: Diagnosis not present

## 2019-08-30 DIAGNOSIS — Z8673 Personal history of transient ischemic attack (TIA), and cerebral infarction without residual deficits: Secondary | ICD-10-CM | POA: Diagnosis not present

## 2019-08-30 DIAGNOSIS — N184 Chronic kidney disease, stage 4 (severe): Secondary | ICD-10-CM | POA: Diagnosis not present

## 2019-08-30 DIAGNOSIS — E1165 Type 2 diabetes mellitus with hyperglycemia: Secondary | ICD-10-CM | POA: Diagnosis not present

## 2019-08-30 DIAGNOSIS — I10 Essential (primary) hypertension: Secondary | ICD-10-CM | POA: Diagnosis not present

## 2019-08-30 DIAGNOSIS — E7800 Pure hypercholesterolemia, unspecified: Secondary | ICD-10-CM | POA: Diagnosis not present

## 2019-09-05 DIAGNOSIS — E1165 Type 2 diabetes mellitus with hyperglycemia: Secondary | ICD-10-CM | POA: Diagnosis not present

## 2019-09-19 ENCOUNTER — Inpatient Hospital Stay: Payer: Medicare PPO | Admitting: Neurology

## 2019-09-19 ENCOUNTER — Encounter: Payer: Self-pay | Admitting: Neurology

## 2019-09-20 ENCOUNTER — Inpatient Hospital Stay: Payer: Medicare PPO | Admitting: Adult Health

## 2019-09-27 DIAGNOSIS — E278 Other specified disorders of adrenal gland: Secondary | ICD-10-CM | POA: Diagnosis not present

## 2019-09-27 DIAGNOSIS — I10 Essential (primary) hypertension: Secondary | ICD-10-CM | POA: Diagnosis not present

## 2019-09-27 DIAGNOSIS — E1165 Type 2 diabetes mellitus with hyperglycemia: Secondary | ICD-10-CM | POA: Diagnosis not present

## 2019-09-27 DIAGNOSIS — N2581 Secondary hyperparathyroidism of renal origin: Secondary | ICD-10-CM | POA: Diagnosis not present

## 2019-09-27 DIAGNOSIS — E1140 Type 2 diabetes mellitus with diabetic neuropathy, unspecified: Secondary | ICD-10-CM | POA: Diagnosis not present

## 2019-09-27 DIAGNOSIS — N184 Chronic kidney disease, stage 4 (severe): Secondary | ICD-10-CM | POA: Diagnosis not present

## 2019-09-28 DIAGNOSIS — I639 Cerebral infarction, unspecified: Secondary | ICD-10-CM

## 2019-09-28 HISTORY — DX: Cerebral infarction, unspecified: I63.9

## 2019-10-06 ENCOUNTER — Other Ambulatory Visit: Payer: Self-pay

## 2019-10-06 ENCOUNTER — Encounter: Payer: Self-pay | Admitting: Neurology

## 2019-10-06 ENCOUNTER — Ambulatory Visit: Payer: Medicare PPO | Admitting: Neurology

## 2019-10-06 VITALS — BP 190/95 | HR 84 | Ht 61.0 in | Wt 185.0 lb

## 2019-10-06 DIAGNOSIS — I6990 Unspecified sequelae of unspecified cerebrovascular disease: Secondary | ICD-10-CM

## 2019-10-06 DIAGNOSIS — R200 Anesthesia of skin: Secondary | ICD-10-CM

## 2019-10-06 MED ORDER — CLOPIDOGREL BISULFATE 75 MG PO TABS: 75.0000 mg | ORAL_TABLET | Freq: Every day | ORAL | 3 refills | Status: DC

## 2019-10-06 NOTE — Patient Instructions (Signed)
I had a long d/w patient about her remote stroke, risk for recurrent stroke/TIAs, personally independently reviewed imaging studies and stroke evaluation results and answered questions.Continue clopidogrel 75 mg daily  for secondary stroke prevention and maintain strict control of hypertension with blood pressure goal below 130/90, diabetes with hemoglobin A1c goal below 6.5% and lipids with LDL cholesterol goal below 70 mg/dL. I also advised the patient to eat a healthy diet with plenty of whole grains, cereals, fruits and vegetables, exercise regularly and maintain ideal body weight .she was counseled to be compliant with using her CPAP every night for sleep apnea and to keep her appointment for the sleep smart stroke prevention study.  She was given a 1 year refill for clopidogrel upon request.  Followup in the future with me in 1 year or call earlier if necessary.

## 2019-10-06 NOTE — Progress Notes (Signed)
[facility] [address] [place]. [place] [address] [phone]       OFFICE FOLLOW-UP NOTE  [name] Date of Birth:  Sep 03, 1950 Medical Record Number:  [id number]   HPI: Briana Garrison is a pleasant 69 year old African-American lady seen today for initial office follow-up visit following hospital admission for stroke in May 2021.  History is obtained from her, review of electronic medical records and I personally reviewed available imaging films in PACS.  She has past medical history of uncontrolled diabetes, hypertension, hyperlipidemia, obesity who presented on 06/21/2019 with 5 to 6-day history of sudden onset of numbness involving left face arm and leg.  She did not come to the hospital as she thought it would get better by its own with family she got help.  MRI scan of the brain was obtained which showed her acute right thalamic lacunar infarct.  MRI of the brain showed only mild to moderate distal right cavernous carotid stenosis which is unrelated to the stroke.  Carotid ultrasound showed no significant extracranial stenosis.  Transthoracic echo showed normal ejection fraction without cardiac source of embolism.  LDL cholesterol is elevated 169 and hemoglobin A1c of 9.1 mg percent.  Patient was started on aspirin and Plavix for 3 weeks followed by Plavix alone.  She also participated in the sleep smart stroke prevention study and tested positive for sleep apnea on the overnight Knox 3 monitor.  She was randomized to the CPAP treatment.  Patient states she is done well since discharge.  Left-sided numbness is improved except for mild residual left foot numbness which still persist but is not bothersome.  She has been ambulating with a cane and feels her balance is steady.  She has been using CPAP every night and feels she has more energy and is feeling better.  She is tolerating Plavix well but ran out of it a week ago and did not call for refill as she had today's  appointment.  She states her blood pressure is usually running in the 140s at home though today it is elevated at 190/95 in office and she blames this on having missed her afternoon dose of medicines.  She is on Lipitor which is tolerating well without muscle aches and pains.  She states her sugars have yet been high.  She does see endocrinologist Dr. Soyla Murphy who is working with her on her sugar control.  She states she has been trying to eat healthy and is in fact lost 12 pounds since her stroke.  She does walk a lot.  She did injure her left toe for which she underwent surgery seems to be healing well now and she plans to exercise more.  She has no new complaints.  She has not had any recurrent stroke or TIA symptoms.  ROS:   14 system review of systems is positive for numbness, foot pain, sleep apnea and all other systems negative  PMH:  Past Medical History:  Diagnosis Date  . Cancer of kidney (Gregory) 05/14/2009  . Diabetes mellitus   . Endometrial mass   . GERD (gastroesophageal reflux disease)   . Hypercholesterolemia   . Hypertension   . Internal hemorrhoids    S/P colonoscopy 06/10/00 by Dr. Lajoyce Corners  . Shingles     Social History:  Social History   Socioeconomic History  . Marital status: Married    Spouse name: [name]  . Number of children: 2  . Years of education: 26  . Highest education level: Not  on file  Occupational History  . 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 Smoking status: Never Smoker  . Smokeless tobacco: Never Used  Vaping Use  . Vaping Use: Never used  Substance and Sexual Activity  . Alcohol use: No  . Drug use: No  . Sexual activity: Not on file  Other Topics Concern  . Not on file  Social History Narrative   Married.  Lives with husband.  Normally ambulates without assistance.   Social Determinants of Health   Financial Resource Strain:   . Difficulty of Paying Living Expenses: Not on  file  Food Insecurity:   . Worried About Charity fundraiser in the Last Year: Not on file  . Ran Out of Food in the Last Year: Not on file  Transportation Needs:   . Lack of Transportation (Medical): Not on file  . Lack of Transportation (Non-Medical): Not on file  Physical Activity:   . Days of Exercise per Week: Not on file  . Minutes of Exercise per Session: Not on file  Stress:   . Feeling of Stress : Not on file  Social Connections:   . Frequency of Communication with Friends and Family: Not on file  . Frequency of Social Gatherings with Friends and Family: Not on file  . Attends Religious Services: Not on file  . Active Member of Clubs or Organizations: Not on file  . Attends Archivist Meetings: Not on file  . Marital Status: Not on file  Intimate Partner Violence:   . Fear of Current or Ex-Partner: Not on file  . Emotionally Abused: Not on file  . Physically Abused: Not on file  . Sexually Abused: Not on file    Medications:   Current Outpatient Medications on File Prior to Visit  Medication Sig Dispense Refill  . acetaminophen (TYLENOL) 500 MG tablet Take 1,000 mg by mouth every 6 (six) hours as needed for mild pain or headache.    . albuterol (PROVENTIL HFA;VENTOLIN HFA) 108 (90 Base) MCG/ACT inhaler Inhale 2 puffs into the lungs every 4 (four) hours as needed for wheezing or shortness of breath (or coughing). 1 Inhaler 0  . carvedilol (COREG) 12.5 MG tablet Take 12.5 mg by mouth 2 (two) times daily with a meal.    . Cholecalciferol (VITAMIN D-3) 25 MCG (1000 UT) CAPS Take 2 capsules by mouth in the morning and at bedtime.    . cloNIDine (CATAPRES) 0.1 MG tablet Take 0.2 mg by mouth 2 (two) times daily.     . Cyanocobalamin (VITAMIN B-12) 2500 MCG SUBL Place 1 tablet under the tongue daily.    Marland Kitchen gabapentin (NEURONTIN) 300 MG capsule Take 300 mg by mouth at bedtime.    . insulin glargine (LANTUS) 100 UNIT/ML injection Inject 0.35 mLs (35 Units total) into the  skin 2 (two) times daily. (Patient taking differently: Inject 10-18 Units into the skin 2 (two) times daily. 18 in am 10 qhs) 10 mL 0  . NOVOLOG FLEXPEN 100 UNIT/ML FlexPen     . senna-docusate (SENOKOT-S) 8.6-50 MG tablet Take 1 tablet by mouth daily as needed for mild constipation.    Marland Kitchen telmisartan (MICARDIS) 20 MG tablet     . TRULICITY 8.11 BJ/4.7WG SOPN Inject 0.5 mLs into the skin once a week.    Marland Kitchen atorvastatin (LIPITOR) 80 MG tablet Take 1 tablet (80 mg total) by mouth daily. 30 tablet 0  .  furosemide (LASIX) 20 MG tablet      No current facility-administered medications on file prior to visit.    Allergies:   Allergies  Allergen Reactions  . Amlodipine Swelling  . Tramadol Diarrhea and Nausea And Vomiting    Physical Exam General: Obese middle-aged African-American lady, seated, in no evident distress Head: head normocephalic and atraumatic.  Neck: supple with no carotid or supraclavicular bruits Cardiovascular: regular rate and rhythm, no murmurs Musculoskeletal: no deformity Skin:  no rash/petichiae Vascular:  Normal pulses all extremities Vitals:   10/06/19 1318 10/06/19 1330  BP: (!) 198/93 (!) 190/95  Pulse: 82 84   Neurologic Exam Mental Status: Awake and fully alert. Oriented to place and time. Recent and remote memory intact. Attention span, concentration and fund of knowledge appropriate. Mood and affect appropriate.  Cranial Nerves: Fundoscopic exam reveals sharp disc margins. Pupils equal, briskly reactive to light. Extraocular movements full without nystagmus. Visual fields full to confrontation. Hearing intact. Facial sensation intact. Face, tongue, palate moves normally and symmetrically.  Motor: Normal bulk and tone. Normal strength in all tested extremity muscles. Sensory.: intact to touch ,pinprick .position and vibratory sensation except mild subjective diminished sensation in the left leg lateral aspect only..  Coordination: Rapid alternating movements  normal in all extremities. Finger-to-nose and heel-to-shin performed accurately bilaterally. Gait and Station: Arises from chair without difficulty. Stance is normal. Gait demonstrates normal stride length and balance and uses a cane..  Unable to heel, toe and tandem walk. Reflexes: 1+ and symmetric. Toes downgoing.   NIHSS  1 Modified Rankin  1   ASSESSMENT: 69 year old African-American lady with right thalamic lacunar infarct in May 2021 from small vessel disease.  Vascular risk factors of hypertension, hyperlipidemia, diabetes, obesity and sleep apnea.  Patient is participating in the sleep smart stroke prevention study and randomized to the CPAP treatment     PLAN: I had a long d/w patient about her recent lacunar stroke, risk for recurrent stroke/TIAs, personally independently reviewed imaging studies and stroke evaluation results and answered questions.Continue clopidogrel 75 mg daily  for secondary stroke prevention and maintain strict control of hypertension with blood pressure goal below 130/90, diabetes with hemoglobin A1c goal below 6.5% and lipids with LDL cholesterol goal below 70 mg/dL. I also advised the patient to eat a healthy diet with plenty of whole grains, cereals, fruits and vegetables, exercise regularly and maintain ideal body weight .she was counseled to be compliant with using her CPAP every night for sleep apnea and to keep her appointment for the sleep smart stroke prevention study.  She was given a 1 year refill for clopidogrel upon request.  Followup in the future with me in 1 year or call earlier if necessary. Greater than 50% of time during this 25 minute visit was spent on counseling,explanation of diagnosis of lacunar stroke and sleep apnea, planning of further management, discussion with patient and family and coordination of care Antony Contras, MD Note: This document was prepared with digital dictation and possible smart phrase technology. Any transcriptional  errors that result from this process are unintentional

## 2019-10-11 DIAGNOSIS — N1832 Chronic kidney disease, stage 3b: Secondary | ICD-10-CM | POA: Diagnosis not present

## 2019-10-11 DIAGNOSIS — I129 Hypertensive chronic kidney disease with stage 1 through stage 4 chronic kidney disease, or unspecified chronic kidney disease: Secondary | ICD-10-CM | POA: Diagnosis not present

## 2019-10-11 DIAGNOSIS — N189 Chronic kidney disease, unspecified: Secondary | ICD-10-CM | POA: Diagnosis not present

## 2019-10-13 DIAGNOSIS — E1165 Type 2 diabetes mellitus with hyperglycemia: Secondary | ICD-10-CM | POA: Diagnosis not present

## 2019-10-21 DIAGNOSIS — M7731 Calcaneal spur, right foot: Secondary | ICD-10-CM | POA: Diagnosis not present

## 2019-10-21 DIAGNOSIS — M792 Neuralgia and neuritis, unspecified: Secondary | ICD-10-CM | POA: Diagnosis not present

## 2019-10-21 DIAGNOSIS — I739 Peripheral vascular disease, unspecified: Secondary | ICD-10-CM | POA: Diagnosis not present

## 2019-10-21 DIAGNOSIS — M79672 Pain in left foot: Secondary | ICD-10-CM | POA: Diagnosis not present

## 2019-10-21 DIAGNOSIS — M7732 Calcaneal spur, left foot: Secondary | ICD-10-CM | POA: Diagnosis not present

## 2019-10-21 DIAGNOSIS — S90122A Contusion of left lesser toe(s) without damage to nail, initial encounter: Secondary | ICD-10-CM | POA: Diagnosis not present

## 2019-11-10 DIAGNOSIS — I70203 Unspecified atherosclerosis of native arteries of extremities, bilateral legs: Secondary | ICD-10-CM | POA: Diagnosis not present

## 2019-11-10 DIAGNOSIS — I70223 Atherosclerosis of native arteries of extremities with rest pain, bilateral legs: Secondary | ICD-10-CM | POA: Diagnosis not present

## 2019-11-10 DIAGNOSIS — I739 Peripheral vascular disease, unspecified: Secondary | ICD-10-CM | POA: Diagnosis not present

## 2019-11-14 DIAGNOSIS — E1165 Type 2 diabetes mellitus with hyperglycemia: Secondary | ICD-10-CM | POA: Diagnosis not present

## 2019-11-30 DIAGNOSIS — I10 Essential (primary) hypertension: Secondary | ICD-10-CM | POA: Diagnosis not present

## 2019-11-30 DIAGNOSIS — Z23 Encounter for immunization: Secondary | ICD-10-CM | POA: Diagnosis not present

## 2019-11-30 DIAGNOSIS — E1165 Type 2 diabetes mellitus with hyperglycemia: Secondary | ICD-10-CM | POA: Diagnosis not present

## 2019-11-30 DIAGNOSIS — Z8673 Personal history of transient ischemic attack (TIA), and cerebral infarction without residual deficits: Secondary | ICD-10-CM | POA: Diagnosis not present

## 2019-11-30 DIAGNOSIS — E7800 Pure hypercholesterolemia, unspecified: Secondary | ICD-10-CM | POA: Diagnosis not present

## 2019-11-30 DIAGNOSIS — E1122 Type 2 diabetes mellitus with diabetic chronic kidney disease: Secondary | ICD-10-CM | POA: Diagnosis not present

## 2019-12-05 ENCOUNTER — Emergency Department (HOSPITAL_COMMUNITY): Payer: Medicare PPO

## 2019-12-05 ENCOUNTER — Observation Stay (HOSPITAL_COMMUNITY): Payer: Medicare PPO

## 2019-12-05 ENCOUNTER — Observation Stay (HOSPITAL_COMMUNITY)
Admission: EM | Admit: 2019-12-05 | Discharge: 2019-12-07 | Disposition: A | Discharge status: Home / Self Care | Payer: Medicare PPO | Attending: Internal Medicine | Admitting: Internal Medicine

## 2019-12-05 ENCOUNTER — Other Ambulatory Visit: Payer: Self-pay

## 2019-12-05 ENCOUNTER — Encounter (HOSPITAL_COMMUNITY): Payer: Self-pay | Admitting: Emergency Medicine

## 2019-12-05 DIAGNOSIS — Z794 Long term (current) use of insulin: Secondary | ICD-10-CM | POA: Diagnosis not present

## 2019-12-05 DIAGNOSIS — I129 Hypertensive chronic kidney disease with stage 1 through stage 4 chronic kidney disease, or unspecified chronic kidney disease: Secondary | ICD-10-CM | POA: Insufficient documentation

## 2019-12-05 DIAGNOSIS — Z20822 Contact with and (suspected) exposure to covid-19: Secondary | ICD-10-CM | POA: Insufficient documentation

## 2019-12-05 DIAGNOSIS — E785 Hyperlipidemia, unspecified: Secondary | ICD-10-CM

## 2019-12-05 DIAGNOSIS — D649 Anemia, unspecified: Secondary | ICD-10-CM | POA: Diagnosis present

## 2019-12-05 DIAGNOSIS — I10 Essential (primary) hypertension: Secondary | ICD-10-CM | POA: Diagnosis not present

## 2019-12-05 DIAGNOSIS — N1832 Chronic kidney disease, stage 3b: Secondary | ICD-10-CM | POA: Insufficient documentation

## 2019-12-05 DIAGNOSIS — E119 Type 2 diabetes mellitus without complications: Secondary | ICD-10-CM | POA: Insufficient documentation

## 2019-12-05 DIAGNOSIS — G459 Transient cerebral ischemic attack, unspecified: Secondary | ICD-10-CM | POA: Diagnosis not present

## 2019-12-05 DIAGNOSIS — I639 Cerebral infarction, unspecified: Secondary | ICD-10-CM | POA: Diagnosis not present

## 2019-12-05 DIAGNOSIS — R29818 Other symptoms and signs involving the nervous system: Secondary | ICD-10-CM | POA: Diagnosis not present

## 2019-12-05 DIAGNOSIS — E1169 Type 2 diabetes mellitus with other specified complication: Secondary | ICD-10-CM

## 2019-12-05 DIAGNOSIS — Z79899 Other long term (current) drug therapy: Secondary | ICD-10-CM | POA: Insufficient documentation

## 2019-12-05 DIAGNOSIS — N179 Acute kidney failure, unspecified: Secondary | ICD-10-CM | POA: Diagnosis not present

## 2019-12-05 DIAGNOSIS — Z85528 Personal history of other malignant neoplasm of kidney: Secondary | ICD-10-CM | POA: Diagnosis not present

## 2019-12-05 DIAGNOSIS — R4781 Slurred speech: Secondary | ICD-10-CM | POA: Diagnosis present

## 2019-12-05 DIAGNOSIS — R200 Anesthesia of skin: Secondary | ICD-10-CM | POA: Diagnosis not present

## 2019-12-05 DIAGNOSIS — I6381 Other cerebral infarction due to occlusion or stenosis of small artery: Secondary | ICD-10-CM | POA: Diagnosis not present

## 2019-12-05 DIAGNOSIS — J320 Chronic maxillary sinusitis: Secondary | ICD-10-CM | POA: Diagnosis not present

## 2019-12-05 LAB — I-STAT CHEM 8, ED
BUN: 46 mg/dL — ABNORMAL HIGH (ref 8–23)
Calcium, Ion: 1.25 mmol/L (ref 1.15–1.40)
Chloride: 105 mmol/L (ref 98–111)
Creatinine, Ser: 2.7 mg/dL — ABNORMAL HIGH (ref 0.44–1.00)
Glucose, Bld: 199 mg/dL — ABNORMAL HIGH (ref 70–99)
HCT: 32 % — ABNORMAL LOW (ref 36.0–46.0)
Hemoglobin: 10.9 g/dL — ABNORMAL LOW (ref 12.0–15.0)
Potassium: 4.7 mmol/L (ref 3.5–5.1)
Sodium: 138 mmol/L (ref 135–145)
TCO2: 25 mmol/L (ref 22–32)

## 2019-12-05 LAB — COMPREHENSIVE METABOLIC PANEL
ALT: 18 U/L (ref 0–44)
AST: 19 U/L (ref 15–41)
Albumin: 3.5 g/dL (ref 3.5–5.0)
Alkaline Phosphatase: 82 U/L (ref 38–126)
Anion gap: 7 (ref 5–15)
BUN: 48 mg/dL — ABNORMAL HIGH (ref 8–23)
CO2: 21 mmol/L — ABNORMAL LOW (ref 22–32)
Calcium: 8.9 mg/dL (ref 8.9–10.3)
Chloride: 105 mmol/L (ref 98–111)
Creatinine, Ser: 2.29 mg/dL — ABNORMAL HIGH (ref 0.44–1.00)
GFR, Estimated: 23 mL/min — ABNORMAL LOW (ref >60)
Glucose, Bld: 204 mg/dL — ABNORMAL HIGH (ref 70–99)
Potassium: 4.8 mmol/L (ref 3.5–5.1)
Sodium: 133 mmol/L — ABNORMAL LOW (ref 135–145)
Total Bilirubin: 0.6 mg/dL (ref 0.3–1.2)
Total Protein: 6.4 g/dL — ABNORMAL LOW (ref 6.5–8.1)

## 2019-12-05 LAB — DIFFERENTIAL
Abs Immature Granulocytes: 0.03 10*3/uL (ref 0.00–0.07)
Basophils Absolute: 0.1 10*3/uL (ref 0.0–0.1)
Basophils Relative: 1 %
Eosinophils Absolute: 0.3 10*3/uL (ref 0.0–0.5)
Eosinophils Relative: 4 %
Immature Granulocytes: 1 %
Lymphocytes Relative: 30 %
Lymphs Abs: 2 10*3/uL (ref 0.7–4.0)
Monocytes Absolute: 0.7 10*3/uL (ref 0.1–1.0)
Monocytes Relative: 10 %
Neutro Abs: 3.6 10*3/uL (ref 1.7–7.7)
Neutrophils Relative %: 54 %

## 2019-12-05 LAB — CBC
HCT: 34.4 % — ABNORMAL LOW (ref 36.0–46.0)
Hemoglobin: 10.9 g/dL — ABNORMAL LOW (ref 12.0–15.0)
MCH: 28.8 pg (ref 26.0–34.0)
MCHC: 31.7 g/dL (ref 30.0–36.0)
MCV: 90.8 fL (ref 80.0–100.0)
Platelets: 270 10*3/uL (ref 150–400)
RBC: 3.79 MIL/uL — ABNORMAL LOW (ref 3.87–5.11)
RDW: 14.3 % (ref 11.5–15.5)
WBC: 6.6 10*3/uL (ref 4.0–10.5)
nRBC: 0 % (ref 0.0–0.2)

## 2019-12-05 LAB — APTT: aPTT: 29 seconds (ref 24–36)

## 2019-12-05 LAB — PROTIME-INR
INR: 1 (ref 0.8–1.2)
Prothrombin Time: 12.9 seconds (ref 11.4–15.2)

## 2019-12-05 LAB — CBG MONITORING, ED
Glucose-Capillary: 188 mg/dL — ABNORMAL HIGH (ref 70–99)
Glucose-Capillary: 212 mg/dL — ABNORMAL HIGH (ref 70–99)

## 2019-12-05 LAB — RESPIRATORY PANEL BY RT PCR (FLU A&B, COVID)
Influenza A by PCR: NEGATIVE
Influenza B by PCR: NEGATIVE
SARS Coronavirus 2 by RT PCR: NEGATIVE

## 2019-12-05 MED ORDER — CARVEDILOL 12.5 MG PO TABS
12.5000 mg | ORAL_TABLET | Freq: Two times a day (BID) | ORAL | Status: DC
  Administered 2019-12-06 – 2019-12-07 (×3): 12.5 mg via ORAL
  Filled 2019-12-05 (×2): qty 1
  Filled 2019-12-05: qty 4

## 2019-12-05 MED ORDER — ROSUVASTATIN CALCIUM 20 MG PO TABS
40.0000 mg | ORAL_TABLET | Freq: Every day | ORAL | Status: DC
  Administered 2019-12-06 – 2019-12-07 (×2): 40 mg via ORAL
  Filled 2019-12-05 (×2): qty 2

## 2019-12-05 MED ORDER — ASPIRIN 81 MG PO CHEW
324.0000 mg | CHEWABLE_TABLET | Freq: Once | ORAL | Status: AC
  Administered 2019-12-05: 324 mg via ORAL
  Filled 2019-12-05: qty 4

## 2019-12-05 MED ORDER — CLONIDINE HCL 0.1 MG PO TABS
0.2000 mg | ORAL_TABLET | Freq: Two times a day (BID) | ORAL | Status: DC
  Administered 2019-12-06 – 2019-12-07 (×3): 0.2 mg via ORAL
  Filled 2019-12-05 (×2): qty 2
  Filled 2019-12-05: qty 1

## 2019-12-05 MED ORDER — VITAMIN B-12 1000 MCG PO TABS
1000.0000 ug | ORAL_TABLET | Freq: Every day | ORAL | Status: DC
  Administered 2019-12-06 – 2019-12-07 (×2): 1000 ug via ORAL
  Filled 2019-12-05 (×2): qty 1

## 2019-12-05 MED ORDER — ATORVASTATIN CALCIUM 40 MG PO TABS: 80.0000 mg | ORAL_TABLET | Freq: Every day | ORAL | Status: DC

## 2019-12-05 MED ORDER — ACETAMINOPHEN 325 MG PO TABS
650.0000 mg | ORAL_TABLET | Freq: Once | ORAL | Status: AC
  Administered 2019-12-05: 650 mg via ORAL
  Filled 2019-12-05: qty 2

## 2019-12-05 MED ORDER — ACETAMINOPHEN 160 MG/5ML PO SOLN: 650.0000 mg | ORAL | Status: DC | PRN

## 2019-12-05 MED ORDER — STROKE: EARLY STAGES OF RECOVERY BOOK: Freq: Once | Status: DC

## 2019-12-05 MED ORDER — LACTATED RINGERS IV BOLUS
500.0000 mL | Freq: Once | INTRAVENOUS | Status: AC
  Administered 2019-12-05: 500 mL via INTRAVENOUS

## 2019-12-05 MED ORDER — INSULIN ASPART 100 UNIT/ML ~~LOC~~ SOLN
0.0000 [IU] | Freq: Three times a day (TID) | SUBCUTANEOUS | Status: DC
  Administered 2019-12-06: 2 [IU] via SUBCUTANEOUS
  Administered 2019-12-06: 1 [IU] via SUBCUTANEOUS
  Administered 2019-12-06 (×2): 3 [IU] via SUBCUTANEOUS
  Administered 2019-12-07 (×2): 2 [IU] via SUBCUTANEOUS

## 2019-12-05 MED ORDER — CLOPIDOGREL BISULFATE 75 MG PO TABS
75.0000 mg | ORAL_TABLET | Freq: Every day | ORAL | Status: DC
  Administered 2019-12-06 – 2019-12-07 (×2): 75 mg via ORAL
  Filled 2019-12-05 (×3): qty 1

## 2019-12-05 MED ORDER — ACETAMINOPHEN 650 MG RE SUPP: 650.0000 mg | RECTAL | Status: DC | PRN

## 2019-12-05 MED ORDER — ASPIRIN EC 81 MG PO TBEC
81.0000 mg | DELAYED_RELEASE_TABLET | Freq: Every day | ORAL | Status: DC
  Administered 2019-12-06 – 2019-12-07 (×2): 81 mg via ORAL
  Filled 2019-12-05 (×2): qty 1

## 2019-12-05 MED ORDER — SODIUM CHLORIDE 0.9% FLUSH
3.0000 mL | Freq: Once | INTRAVENOUS | Status: AC
Start: 2019-12-05 — End: 2019-12-06
  Administered 2019-12-06: 3 mL via INTRAVENOUS

## 2019-12-05 MED ORDER — ALBUTEROL SULFATE (2.5 MG/3ML) 0.083% IN NEBU: 3.0000 mL | INHALATION_SOLUTION | Freq: Four times a day (QID) | RESPIRATORY_TRACT | Status: DC | PRN

## 2019-12-05 MED ORDER — ENOXAPARIN SODIUM 30 MG/0.3ML ~~LOC~~ SOLN
30.0000 mg | Freq: Every day | SUBCUTANEOUS | Status: DC
  Administered 2019-12-06 (×2): 30 mg via SUBCUTANEOUS
  Filled 2019-12-05 (×2): qty 0.3

## 2019-12-05 MED ORDER — ACETAMINOPHEN 325 MG PO TABS
650.0000 mg | ORAL_TABLET | ORAL | Status: DC | PRN
  Administered 2019-12-05 – 2019-12-06 (×2): 650 mg via ORAL
  Filled 2019-12-05 (×2): qty 2

## 2019-12-05 MED ORDER — INSULIN GLARGINE 100 UNIT/ML ~~LOC~~ SOLN
18.0000 [IU] | Freq: Every day | SUBCUTANEOUS | Status: DC
  Administered 2019-12-05 – 2019-12-06 (×2): 18 [IU] via SUBCUTANEOUS
  Filled 2019-12-05 (×3): qty 0.18

## 2019-12-05 NOTE — Consult Note (Signed)
NEUROLOGY CONSULTATION NOTE   Date of service: December 05, 2019 Patient Name: Briana Garrison MRN:  [id number] DOB:  06/30/1950 Reason for consult: "Stroke code"  History of Present Illness  Briana Garrison is a 69 y.o. female with PMH significant for GERD, DM2, HTN, HLD, shingles, prior stroke in June 2021 with residual mild left-sided numbness who presents today with 2 episodes of slurred speech and questionable facial droop.  Patient went to bed at 9 PM on 12/04/2019 she woke up at 4 AM in the morning and her husband reported that her speech was slurred and her face looked a little weird.  She spoke to her daughter at 5 AM in the morning and daughter to commented on her speech being slurred.  The symptoms resolved around 7 to 7:30 AM on 12/05/2019.  At 1630 on 12/05/2019, patient had recurrence of her symptoms with slurred speech and this was noted by the daughter and she immediately came to the ED.  Patient is on Plavix at home and took her Plavix 75 mg this morning.  She does not smoke, no alcohol, no recreational substances.  She does have a family history of stroke.  She denies any fever, chills, symptoms of upper respiratory infection or a urinary tract infection.  She does endorse having flu vaccine last week.  She has diabetes, hypertension, hypercholesterolemia and reports that her diabetes is not well controlled but she is been working with her caretakers to get it under better control.  LK W: 1630 on 12/05/2019. MR S: 0 NIH stroke scale: 1 for mild left-sided sensory deficit which she has at baseline. tPA given: No, resolution of symptoms with residual left-sided sensory deficit which is mild and is old. Thrombectomy: Not pursued due to low NIH stroke scale.    ROS   Constitutional Denies weight loss, fever and chills.   HEENT Denies changes in vision and hearing.   Respiratory Denies SOB and cough.   CV Denies palpitations and CP   GI Denies abdominal pain, nausea, vomiting  and diarrhea.   GU Denies dysuria and urinary frequency.   MSK Denies myalgia and joint pain.   Skin Denies rash and pruritus.   Neurological Denies headache and syncope.   Psychiatric Denies recent changes in mood. Denies anxiety and depression.    Past History   Past Medical History:  Diagnosis Date  . Cancer of kidney ([place]) 05/14/2009  . Diabetes mellitus   . Endometrial mass   . GERD (gastroesophageal reflux disease)   . Hypercholesterolemia   . Hypertension   . Internal hemorrhoids    S/P colonoscopy 06/10/00 by Dr. Lajoyce Corners  . Shingles    Past Surgical History:  Procedure Laterality Date  . BIOPSY  09/06/2018   Procedure: BIOPSY;  Surgeon: Rogene Houston, MD;  Location: [facility];  Service: Endoscopy;;  . CHOLECYSTECTOMY  11/13/08  . COLONOSCOPY N/A 09/06/2018   Procedure: COLONOSCOPY;  Surgeon: Rogene Houston, MD;  Location: [facility];  Service: Endoscopy;  Laterality: N/A;  730  . CYSTOSCOPY KIDNEY W/ URETERAL GUIDE WIRE  05/14/09  . Diagnostic hysteroscopy, D&C, endometrial polypectomy  07/01/2005  . LIPECTOMY    . POLYPECTOMY  09/06/2018   Procedure: POLYPECTOMY;  Surgeon: Rogene Houston, MD;  Location: [facility];  Service: Endoscopy;;   Family History  Problem Relation Age of Onset  . Ovarian cancer Mother   . Stroke Mother   . Stroke Father    Social History  Socioeconomic History  . Marital status: Married    Spouse name: [name]  . Number of children: 2  . Years of education: 65  . Highest education level: Not on file  Occupational History  . 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 Smoking status: Never Smoker  . Smokeless tobacco: Never Used  Vaping Use  . Vaping Use: Never used  Substance and Sexual Activity  . Alcohol use: No  . Drug use: No  . Sexual activity: Not on file  Other Topics Concern  . Not on file  Social History Narrative   Married.  Lives with husband.   Normally ambulates without assistance.   Social Determinants of Health   Financial Resource Strain:   . Difficulty of Paying Living Expenses: Not on file  Food Insecurity:   . Worried About Charity fundraiser in the Last Year: Not on file  . Ran Out of Food in the Last Year: Not on file  Transportation Needs:   . Lack of Transportation (Medical): Not on file  . Lack of Transportation (Non-Medical): Not on file  Physical Activity:   . Days of Exercise per Week: Not on file  . Minutes of Exercise per Session: Not on file  Stress:   . Feeling of Stress : Not on file  Social Connections:   . Frequency of Communication with Friends and Family: Not on file  . Frequency of Social Gatherings with Friends and Family: Not on file  . Attends Religious Services: Not on file  . Active Member of Clubs or Organizations: Not on file  . Attends Archivist Meetings: Not on file  . Marital Status: Not on file   Allergies  Allergen Reactions  . Amlodipine Swelling  . Tramadol Diarrhea and Nausea And Vomiting    Medications  (Not in a hospital admission)    Vitals   There were no vitals filed for this visit.   There is no height or weight on file to calculate BMI.  Physical Exam   General: Laying comfortably in bed; in no acute distress.  HENT: Normal oropharynx and mucosa. Normal external appearance of ears and nose.  Neck: Supple, no pain or tenderness  CV: No JVD. No peripheral edema.  Pulmonary: Symmetric Chest rise. Normal respiratory effort.  Abdomen: Soft to touch, non-tender.  Ext: No cyanosis, edema, or deformity Skin: No rash. Normal palpation of skin.  Musculoskeletal: Normal digits and nails by inspection. No clubbing.   Neurologic Examination  Mental status/Cognition: Alert, oriented to self, place, month and year, good attention.  Speech/language: Fluent, comprehension intact, object naming intact, repetition intact.  Cranial nerves:   CN II Pupils equal  and reactive to light, no VF deficits    CN III,IV,VI EOM intact, no gaze preference or deviation, no nystagmus    CN V normal sensation in V1, V2, and V3 segments bilaterally    CN VII no asymmetry, no nasolabial fold flattening    CN VIII normal hearing to speech    CN IX & X normal palatal elevation, no uvular deviation    CN XI 5/5 head turn and 5/5 shoulder shrug bilaterally    CN XII midline tongue protrusion    Motor:  Muscle bulk: normal, tone normal, pronator drift none tremor none Mvmt Root Nerve  Muscle Right Left Comments  SA C5/6 Ax Deltoid 5 5   EF C5/6 Mc Biceps 5 5  EE C6/7/8 Rad Triceps 5 5   WF C6/7 Med FCR 5 5   WE C7/8 PIN ECU 5 5   F Ab C8/T1 U ADM/FDI 5 5   HF L1/2/3 Fem Illopsoas 5 5   KE L2/3/4 Fem Quad 5 5   DF L4/5 D Peron Tib Ant 5 5   PF S1/2 Tibial Grc/Sol 5 5    Reflexes:  Right Left Comments  Pectoralis      Biceps (C5/6) 1 1   Brachioradialis (C5/6) 1 1    Triceps (C6/7) 1 1    Patellar (L3/4) 1 1    Achilles (S1) 1 1    Hoffman      Plantar     Jaw jerk    Sensation:  Light touch  decreased by 25% in left upper extremity and left lower extremity   Pin prick  decreased by 25% in left upper extremity and left lower extremity.   Temperature    Vibration   Proprioception    Coordination/Complex Motor:  - Finger to Nose intact BL - Heel to shin intact BL - Rapid alternating movement intact BL - Gait: deferred.  Labs   CBC:  Recent Labs  Lab 12/05/19 1937 12/05/19 1953  WBC 6.6  --   NEUTROABS 3.6  --   HGB 10.9* 10.9*  HCT 34.4* 32.0*  MCV 90.8  --   PLT 270  --     Basic Metabolic Panel:  Lab Results  Component Value Date   NA 138 12/05/2019   K 4.7 12/05/2019   CO2 20 (L) 07/24/2019   GLUCOSE 199 (H) 12/05/2019   BUN 46 (H) 12/05/2019   CREATININE 2.70 (H) 12/05/2019   CALCIUM 9.6 07/24/2019   GFRNONAA 27 (L) 07/24/2019   GFRAA 31 (L) 07/24/2019   Lipid Panel:  Lab Results  Component Value Date   LDLCALC 169  (H) 06/21/2019   HgbA1c:  Lab Results  Component Value Date   HGBA1C 9.1 (H) 06/21/2019   Urine Drug Screen: No results found for: LABOPIA, COCAINSCRNUR, LABBENZ, AMPHETMU, THCU, LABBARB  Alcohol Level No results found for: [facility]   Results for orders placed during the hospital encounter of 12/05/19  CT HEAD CODE STROKE WO CONTRAST  Narrative CLINICAL DATA:  Code stroke. 69 year old female with abnormal speech and left arm numbness.  EXAM: CT HEAD WITHOUT CONTRAST  TECHNIQUE: Contiguous axial images were obtained from the base of the skull through the vertex without intravenous contrast.  COMPARISON:  Brain MRI 06/21/2019.  FINDINGS: Brain: Numerous chronic lacunar infarcts in the bilateral deep gray matter nuclei. Chronic lacunar infarcts in the brainstem. Associated Patchy and confluent bilateral cerebral white matter hypodensity.  No midline shift, ventriculomegaly, mass effect, evidence of mass lesion, intracranial hemorrhage or evidence of cortically based acute infarction. No definite cortical encephalomalacia.  Vascular: Calcified atherosclerosis at the skull base. No suspicious intracranial vascular hyperdensity.  Skull: No acute osseous abnormality identified.  Sinuses/Orbits: Chronic right maxillary sinusitis. Stable left sphenoid sinus retention cysts. Tympanic cavities and mastoids remain well aerated.  Other: No acute orbit or scalp soft tissue finding.  ASPECTS [facility] Stroke [facility] Early CT Score)  Total score (0-10 with 10 being normal): 10  IMPRESSION: 1. Severe chronic small-vessel ischemia. No acute cortically based infarct or acute intracranial hemorrhage identified. ASPECTS 10. 2. These results were communicated to Dr. Bartholome Bill at 7:48 pm on 12/05/2019 by text page via the [facility] messaging system. 3. Chronic right maxillary sinusitis.   Electronically  Signed By: Genevie Ann M.D. On: 12/05/2019 19:49    Impression   CAMBREE HENDRIX  is a 69 y.o. female with PMH significant for  GERD, DM2, HTN, HLD, shingles, prior stroke in June 2021 with residual mild left-sided numbness who presents today with 2 episodes of slurred speech and questionable facial droop.  Symptoms resolved by the time she came to the ED. Her neurologic examination is notable for mild left-sided sensory deficit which she reports is residual from her prior stroke in June 2021.  Her symptoms are suggestive of TIA.  Recommendations  - Frequent Neuro checks per stroke unit protocol - Recommend brain imaging with MRI Brain without contrast - Recommend Vascular imaging with MRA Angio Head without contrast and US Carotid doppler - Recommend obtaining TTE - Recommend obtaining Lipid panel with LDL - Please start statin if LDL > 70 - Recommend HbA1c - Antithrombotic - Aspirin 325mg  once now. Recommend Aspirin 81mg  and Plavix 75 daily x 3 weeks, then Plavix 75mg  only. - Recommend DVT ppx - SBP goal - permissive hypertension first 24 h < 220/110. Held home meds.  - Recommend Telemetry monitoring for arrythmia - Recommend bedside swallow screen prior to PO intake. - Stroke education booklet - Recommend PT/OT/SLP consult  ______________________________________________________________________   Thank you for the opportunity to take part in the care of this patient. If you have any further questions, please contact the neurology consultation attending.  Signed,  [place] Pager Number [phone]

## 2019-12-05 NOTE — ED Notes (Signed)
Pt activated as code stroke per Dr. Tyrone Nine.

## 2019-12-05 NOTE — ED Provider Notes (Signed)
I was asked to come and evaluate the patient in triage.  Patient is a 69 year old female with a history of stroke comes in with a chief complaints of slurred speech facial droop and unsteadiness of gait.  Reportedly this had initially started 5 this morning resolved and then reoccurred at 4 PM.  On my exam the patient has an unsteady gait that the family states is not typical.  I do not appreciate any facial droop.  I do not appreciate any slurred speech.  No obvious weakness.  Cranial nerves II through XII appear to be intact for me.  Due to acute onset symptoms and speech different than her baseline and instability in gait activated a code stroke.   Deno Etienne, DO 12/05/19 1936

## 2019-12-05 NOTE — ED Notes (Signed)
CBG 212 

## 2019-12-05 NOTE — ED Notes (Signed)
Neurologist at bedside. 

## 2019-12-05 NOTE — ED Notes (Signed)
[facility] called to activate Code Stroke at 1934.

## 2019-12-05 NOTE — H&P (Signed)
History and Physical    Briana Garrison DGL:[id number] DOB: Oct 27, 1950 DOA: 12/05/2019  PCP: [name], MD  Patient coming from: Home.  Chief Complaint: Slurred speech and left facial droop.  HPI: Briana Garrison is a 69 y.o. female with history of stroke with left-sided weakness in May 2021 was noticed to have some slurred speech around the morning of December 04, 2019 noticed by patient's daughter which lasted for around 10 minutes and resolved.  Patient's daughter again noticed this slurred speech this evening around 4:30 PM along with some left facial droop at this point decided to come to the ER.  Has mild weakness of the left upper and lower extremity from the previous stroke.  No visual symptoms or any difficulty swallowing.  Symptoms largely resolved by the time patient reached the ER.  ED Course: In the ER MRI brain shows acute CVA involving the right parieto-occipital and right insular region.  EKG shows normal sinus rhythm.  MRA of the brain and neck does not show any large vessel obstruction.  Neurology on-call was consulted and since patient is out of the window and also related to symptoms resolved patient was not felt to be a candidate for TPA.  Patient did pass swallow.  Labs are remarkable for mildly elevated creatinine from baseline of 1.8 it is around 2.2.  Hemoglobin is at baseline and Covid test was negative.  Review of Systems: As per HPI, rest all negative.   Past Medical History:  Diagnosis Date  . Cancer of kidney ([place]) 05/14/2009  . Diabetes mellitus   . Endometrial mass   . GERD (gastroesophageal reflux disease)   . Hypercholesterolemia   . Hypertension   . Internal hemorrhoids    S/P colonoscopy 06/10/00 by Dr. Lajoyce Corners  . Shingles     Past Surgical History:  Procedure Laterality Date  . BIOPSY  09/06/2018   Procedure: BIOPSY;  Surgeon: Rogene Houston, MD;  Location: [facility];  Service: Endoscopy;;  . CHOLECYSTECTOMY  11/13/08  . COLONOSCOPY N/A  09/06/2018   Procedure: COLONOSCOPY;  Surgeon: Rogene Houston, MD;  Location: [facility];  Service: Endoscopy;  Laterality: N/A;  730  . CYSTOSCOPY KIDNEY W/ URETERAL GUIDE WIRE  05/14/09  . Diagnostic hysteroscopy, D&C, endometrial polypectomy  07/01/2005  . LIPECTOMY    . POLYPECTOMY  09/06/2018   Procedure: POLYPECTOMY;  Surgeon: Rogene Houston, MD;  Location: [facility];  Service: Endoscopy;;     reports that she has never smoked. She has never used smokeless tobacco. She reports that she does not drink alcohol and does not use drugs.  Allergies  Allergen Reactions  . Amlodipine Swelling  . Tramadol Diarrhea and Nausea And Vomiting    Family History  Problem Relation Age of Onset  . Ovarian cancer Mother   . Stroke Mother   . Stroke Father     Prior to Admission medications   Medication Sig Start Date End Date Taking? Authorizing Provider  acetaminophen (TYLENOL) 500 MG tablet Take 500 mg by mouth every 6 (six) hours as needed for mild pain or headache.    Yes [provider]  albuterol (PROAIR HFA) 108 (90 Base) MCG/ACT inhaler Inhale 2 puffs into the lungs every 6 (six) hours as needed for wheezing or shortness of breath.   Yes [provider]  carvedilol (COREG) 12.5 MG tablet Take 12.5 mg by mouth 2 (two) times daily with a meal.   Yes [provider]  cetirizine (ZYRTEC) 10 MG tablet Take 10 mg by mouth daily as needed for allergies.   Yes [provider]  Cholecalciferol (VITAMIN D-3) 25 MCG (1000 UT) CAPS Take 1,000 Units by mouth daily.    Yes [provider]  cloNIDine (CATAPRES) 0.1 MG tablet Take 0.2 mg by mouth 2 (two) times daily.    Yes [provider]  clopidogrel (PLAVIX) 75 MG tablet Take 1 tablet (75 mg total) by mouth daily. 10/06/19  Yes Garvin Fila, MD  Dulaglutide (TRULICITY) 1.5 JO/8.7OM SOPN Inject 1.5 mg into the skin every Tuesday.    Yes [provider]  furosemide (LASIX) 20 MG  tablet Take 40 mg by mouth 2 (two) times daily.  09/04/19  Yes [provider]  gabapentin (NEURONTIN) 100 MG capsule Take 100 mg by mouth daily as needed (shoulder pain).   Yes [provider]  glucose 4 GM chewable tablet Chew 1 tablet by mouth once as needed for low blood sugar.   Yes [provider]  insulin aspart (NOVOLOG FLEXPEN) 100 UNIT/ML FlexPen Inject into the skin 3 (three) times daily with meals. Per sliding scale - based on CBG   Yes [provider]  insulin glargine (LANTUS) 100 unit/mL SOPN Inject 18 Units into the skin at bedtime.   Yes [provider]  rosuvastatin (CRESTOR) 40 MG tablet Take 40 mg by mouth daily. 08/30/19  Yes [provider]  senna-docusate (SENOKOT-S) 8.6-50 MG tablet Take 1 tablet by mouth daily as needed for mild constipation.   Yes [provider]  telmisartan (MICARDIS) 20 MG tablet Take 20 mg by mouth daily.  08/30/19  Yes [provider]  vitamin B-12 (CYANOCOBALAMIN) 1000 MCG tablet Take 1,000 mcg by mouth daily.   Yes [provider]  atorvastatin (LIPITOR) 80 MG tablet Take 1 tablet (80 mg total) by mouth daily. 06/24/19 07/24/19  Darliss Cheney, MD  carvedilol (COREG) 25 MG tablet Take 25 mg by mouth 2 (two) times daily. 12/01/19   [provider]  Dulaglutide (TRULICITY) 3 VE/7.2CN SOPN Inject 3 mg into the skin every Tuesday.    [provider]    Physical Exam: Constitutional: Moderately built and nourished. Vitals:   12/05/19 2015 12/05/19 2126 12/05/19 2145  BP: (!) 170/73 (!) 178/71 (!) 197/83  Pulse: 77 69 70  Resp: 19 17 15   Temp:  98.1 F (36.7 C)   TempSrc:  Oral   SpO2: 100% 99% 98%  Weight: 84.4 kg    Height: 5\' 1"  (1.549 m)     Eyes: Anicteric no pallor. ENMT: No discharge from the ears eyes nose or mouth. Neck: No mass felt.  No neck rigidity. Respiratory: No rhonchi or crepitations. Cardiovascular: S1-S2 heard. Abdomen: Soft  nontender bowel sounds present. Musculoskeletal: No edema. Skin: No rash. Neurologic: Alert awake oriented to time place and person.  Left upper and lower committees 4 x 5.  Right upper and lower extremities 5 x 5.  No facial asymmetry tongue is midline pupils equal reactive to light. Psychiatric: Appears normal.  Normal affect.   Labs on Admission: I have personally reviewed following labs and imaging studies  CBC: Recent Labs  Lab 12/05/19 1937 12/05/19 1953  WBC 6.6  --   NEUTROABS 3.6  --   HGB 10.9* 10.9*  HCT 34.4* 32.0*  MCV 90.8  --   PLT 270  --    Basic Metabolic Panel: Recent Labs  Lab 12/05/19 1937 12/05/19 1953  NA  133* 138  K 4.8 4.7  CL 105 105  CO2 21*  --   GLUCOSE 204* 199*  BUN 48* 46*  CREATININE 2.29* 2.70*  CALCIUM 8.9  --    GFR: Estimated Creatinine Clearance: 19.4 mL/min (A) (by C-G formula based on SCr of 2.7 mg/dL (H)). Liver Function Tests: Recent Labs  Lab 12/05/19 1937  AST 19  ALT 18  ALKPHOS 82  BILITOT 0.6  PROT 6.4*  ALBUMIN 3.5   No results for input(s): LIPASE, AMYLASE in the last 168 hours. No results for input(s): AMMONIA in the last 168 hours. Coagulation Profile: Recent Labs  Lab 12/05/19 1937  INR 1.0   Cardiac Enzymes: No results for input(s): CKTOTAL, CKMB, CKMBINDEX, TROPONINI in the last 168 hours. BNP (last 3 results) No results for input(s): PROBNP in the last 8760 hours. HbA1C: No results for input(s): HGBA1C in the last 72 hours. CBG: Recent Labs  Lab 12/05/19 1938  GLUCAP 188*   Lipid Profile: No results for input(s): CHOL, HDL, LDLCALC, TRIG, CHOLHDL, LDLDIRECT in the last 72 hours. Thyroid Function Tests: No results for input(s): TSH, T4TOTAL, FREET4, T3FREE, THYROIDAB in the last 72 hours. Anemia Panel: No results for input(s): VITAMINB12, FOLATE, FERRITIN, TIBC, IRON, RETICCTPCT in the last 72 hours. Urine analysis:    Component Value Date/Time   COLORURINE YELLOW 07/25/2019 0508    APPEARANCEUR HAZY (A) 07/25/2019 0508   LABSPEC 1.009 07/25/2019 0508   PHURINE 5.0 07/25/2019 0508   GLUCOSEU 50 (A) 07/25/2019 0508   HGBUR MODERATE (A) 07/25/2019 0508   BILIRUBINUR NEGATIVE 07/25/2019 0508   KETONESUR NEGATIVE 07/25/2019 0508   PROTEINUR 30 (A) 07/25/2019 0508   UROBILINOGEN 0.2 06/23/2011 1100   NITRITE NEGATIVE 07/25/2019 0508   LEUKOCYTESUR MODERATE (A) 07/25/2019 0508   Sepsis Labs: @LABRCNTIP (procalcitonin:4,lacticidven:4) ) Recent Results (from the past 240 hour(s))  Respiratory Panel by RT PCR (Flu A&B, Covid) - Nasopharyngeal Swab     Status: None   Collection Time: 12/05/19  8:02 PM   Specimen: Nasopharyngeal Swab  Result Value Ref Range Status   SARS Coronavirus 2 by RT PCR NEGATIVE NEGATIVE Final    Comment: (NOTE) SARS-CoV-2 target nucleic acids are NOT DETECTED.  The SARS-CoV-2 RNA is generally detectable in upper respiratoy specimens during the acute phase of infection. The lowest concentration of SARS-CoV-2 viral copies this assay can detect is 131 copies/mL. A negative result does not preclude SARS-Cov-2 infection and should not be used as the sole basis for treatment or other patient management decisions. A negative result may occur with  improper specimen collection/handling, submission of specimen other than nasopharyngeal swab, presence of viral mutation(s) within the areas targeted by this assay, and inadequate number of viral copies (<131 copies/mL). A negative result must be combined with clinical observations, patient history, and epidemiological information. The expected result is Negative.  Fact Sheet for Patients:  PinkCheek.be  Fact Sheet for Healthcare Providers:  GravelBags.it  This test is no t yet approved or cleared by the [place] FDA and  has been authorized for detection and/or diagnosis of SARS-CoV-2 by FDA under an Emergency Use Authorization (EUA).  This EUA will remain  in effect (meaning this test can be used) for the duration of the COVID-19 declaration under Section 564(b)(1) of the Act, 21 U.S.C. section 360bbb-3(b)(1), unless the authorization is terminated or revoked sooner.     Influenza A by PCR NEGATIVE NEGATIVE Final   Influenza B by PCR NEGATIVE NEGATIVE Final    Comment: (  NOTE) The Xpert Xpress SARS-CoV-2/FLU/RSV assay is intended as an aid in  the diagnosis of influenza from Nasopharyngeal swab specimens and  should not be used as a sole basis for treatment. Nasal washings and  aspirates are unacceptable for Xpert Xpress SARS-CoV-2/FLU/RSV  testing.  Fact Sheet for Patients: PinkCheek.be  Fact Sheet for Healthcare Providers: GravelBags.it  This test is not yet approved or cleared by the [place] FDA and  has been authorized for detection and/or diagnosis of SARS-CoV-2 by  FDA under an Emergency Use Authorization (EUA). This EUA will remain  in effect (meaning this test can be used) for the duration of the  Covid-19 declaration under Section 564(b)(1) of the Act, 21  U.S.C. section 360bbb-3(b)(1), unless the authorization is  terminated or revoked. Performed at [facility], [place] [address], [place], [place] [address]      Radiological Exams on Admission: MR ANGIO HEAD WO CONTRAST  Result Date: 12/05/2019 CLINICAL DATA:  Initial evaluation for acute TIA. Speech abnormality with left arm numbness. EXAM: MRI HEAD WITHOUT CONTRAST MRA HEAD WITHOUT CONTRAST MRA NECK WITHOUT CONTRAST TECHNIQUE: Multiplanar, multiecho pulse sequences of the brain and surrounding structures were obtained without intravenous contrast. Angiographic images of the Circle of Willis were obtained using MRA technique without intravenous contrast. Angiographic images of the neck were obtained using MRA technique without intravenous contrast. Carotid stenosis measurements (when  applicable) are obtained utilizing NASCET criteria, using the distal internal carotid diameter as the denominator. COMPARISON:  Prior CT from earlier the same day. FINDINGS: MRI HEAD FINDINGS Brain: Examination technically limited by motion artifact and patient's inability to tolerate the full length of the exam. Generalized age-related cerebral atrophy. Patchy and confluent T2/FLAIR hyperintensity within the periventricular deep white matter both cerebral hemispheres most consistent with chronic small vessel ischemic disease, moderate to severe nature. Multiple remote lacunar infarcts present about the bilateral basal ganglia, thalami, and pons. Few remote lacunar infarcts noted involving the body of the corpus callosum and hemispheric cerebral white matter as well. Patchy multifocal ischemic infarcts seen involving the cortical and subcortical aspect of the right parieto-occipital region (series 5, image 84). Additional 7 mm acute ischemic infarcts seen involving the right insula/subinsular white matter (series 5, image 77). No associated hemorrhage or mass effect. No other diffusion abnormality to suggest acute or subacute ischemia. Gray-white matter differentiation otherwise maintained. No other areas of encephalomalacia to suggest chronic cortical infarction. No evidence for acute intracranial hemorrhage. No mass lesion, midline shift or mass effect. No hydrocephalus or extra-axial fluid collection. Pituitary gland suprasellar region within normal limits. Midline structures intact. Vascular: Major intracranial vascular flow voids are maintained. Skull and upper cervical spine: Craniocervical junction within normal limits. Bone marrow signal intensity normal. No scalp soft tissue abnormality. Sinuses/Orbits: Patient status post bilateral ocular lens replacement. Moderate mucosal thickening noted within the right greater than left maxillary sinuses, chronic in appearance. Left sphenoid sinus retention cyst noted  as well. Additional scattered mucosal thickening noted within the ethmoidal air cells. Trace right mastoid effusion noted, of doubtful significance. Other: None. MRA HEAD FINDINGS ANTERIOR CIRCULATION: Examination degraded by motion artifact. Visualized distal cervical segments of the internal carotid arteries are patent with antegrade flow. Petrous, cavernous, and supraclinoid ICAs patent without appreciable flow-limiting stenosis or other abnormality. There is an apparent 3 mm focal outpouching extending superiorly from the right ICA terminus at the origin of the right A1, which could reflect focal vessel tortuosity or possibly small aneurysm, difficult to be certain on  this motion degraded exam (series 5, image 111). A1 otherwise segments patent bilaterally. Grossly normal anterior communicating artery complex. Suspected multifocal moderate left A2 and A3 stenoses (series 1057, image 17). Right ACA patent to its distal aspect without appreciable stenosis. Right M1 widely patent proximally. There is a focal severe distal right M1 stenosis just prior to the bifurcation (series 1057, image 9). Right MCA branches perfused distally, although demonstrate diffuse small vessel atheromatous irregularity. Left M1 patent without stenosis. Left MCA branches well perfused, although demonstrate diffuse small vessel atheromatous irregularity. POSTERIOR CIRCULATION: Left vertebral artery dominant and patent to the vertebrobasilar junction without stenosis. Right vertebral artery somewhat diminutive and irregular but is grossly patent as well. Neither PICA well visualized. Basilar patent to its distal aspect without stenosis. Superior cerebral arteries patent bilaterally. Both PCAs primarily supplied via the basilar. Moderate to severe stenoses noting the mid left P2 segment (series 1045, image 6). Probable short-segment moderate distal right P2 stenosis (series 1045, image 17). Both PCAs are perfused distally, although  demonstrate distal small vessel atheromatous irregularity. MRA NECK FINDINGS Examination severely limited by extensive motion artifact, lack of IV contrast, and patient's inability to tolerate the full length of the exam. Visualized aortic arch grossly within normal limits for caliber. Normal branching pattern seen about the arch. No obvious flow-limiting stenosis about the origin the great vessels, although evaluation fairly limited. Right CCA grossly patent to the bifurcation without stenosis. Atheromatous irregularity with suspected at least mild approximate 30% stenosis at the origin of the right ICA (series 7, image 9). Right ICA otherwise patent distally to the skull base without stenosis or occlusion. Partially visualized left CCA patent to the bifurcation. No obvious flow-limiting stenosis about the left bifurcation (series 7, image 19). Left ICA patent distally to the skull base without stenosis or occlusion. Neither origin of either vertebral artery is well visualized. The dominant left vertebral artery appears grossly patent within the neck (series 7, image 17). The diminutive right vertebral artery is not well visualized, and could be possibly occluded, although it appears to be grossly patent at the skull base on corresponding MRA of the head. Evaluation for possible vertebral stenosis or other pathology limited on this exam. IMPRESSION: MRI HEAD IMPRESSION: 1. Technically limited exam due to motion artifact and patient's inability to tolerate the full length of the exam. 2. Patchy multifocal small volume acute ischemic infarcts involving the cortical and subcortical right parieto-occipital region and right insula. No associated hemorrhage or mass effect. 3. Underlying age-related cerebral atrophy with moderate to severe chronic microvascular ischemic disease, with multiple remote lacunar infarcts involving the bilateral basal ganglia, thalami, and pons. MRA HEAD IMPRESSION: 1. Technically limited exam  due to extensive motion artifact. 2. No large vessel occlusion. 3. Severe distal right M1 stenosis, with additional moderate to severe atheromatous stenoses involving the left ACA and PCAs as above. Distal small vessel atheromatous irregularity. 4. Question 3 mm focal outpouching extending superiorly from the right ICA terminus at the origin of the right A1, which could reflect focal vessel tortuosity or possibly small aneurysm. Difficult to be certain on this motion degraded exam. Attention at follow-up recommended. MRA NECK IMPRESSION: 1. Severely limited exam due to extensive motion artifact, lack of IV contrast, and patient's inability to tolerate the full length of the exam. 2. Approximate 30% atheromatous stenosis at the right bifurcation/proximal right ICA. 3. Gross patency of the left carotid artery system, with no appreciable hemodynamically significant stenosis. 4. Gross patency of the dominant left  vertebral artery. Right vertebral artery not visualized, and may be occluded, although appears grossly patent at the skull base on corresponding MRA of the head. Electronically Signed   By: Jeannine Boga M.D.   On: 12/05/2019 21:43   MR MRA NECK WO CONTRAST  Result Date: 12/05/2019 CLINICAL DATA:  Initial evaluation for acute TIA. Speech abnormality with left arm numbness. EXAM: MRI HEAD WITHOUT CONTRAST MRA HEAD WITHOUT CONTRAST MRA NECK WITHOUT CONTRAST TECHNIQUE: Multiplanar, multiecho pulse sequences of the brain and surrounding structures were obtained without intravenous contrast. Angiographic images of the Circle of Willis were obtained using MRA technique without intravenous contrast. Angiographic images of the neck were obtained using MRA technique without intravenous contrast. Carotid stenosis measurements (when applicable) are obtained utilizing NASCET criteria, using the distal internal carotid diameter as the denominator. COMPARISON:  Prior CT from earlier the same day. FINDINGS: MRI  HEAD FINDINGS Brain: Examination technically limited by motion artifact and patient's inability to tolerate the full length of the exam. Generalized age-related cerebral atrophy. Patchy and confluent T2/FLAIR hyperintensity within the periventricular deep white matter both cerebral hemispheres most consistent with chronic small vessel ischemic disease, moderate to severe nature. Multiple remote lacunar infarcts present about the bilateral basal ganglia, thalami, and pons. Few remote lacunar infarcts noted involving the body of the corpus callosum and hemispheric cerebral white matter as well. Patchy multifocal ischemic infarcts seen involving the cortical and subcortical aspect of the right parieto-occipital region (series 5, image 84). Additional 7 mm acute ischemic infarcts seen involving the right insula/subinsular white matter (series 5, image 77). No associated hemorrhage or mass effect. No other diffusion abnormality to suggest acute or subacute ischemia. Gray-white matter differentiation otherwise maintained. No other areas of encephalomalacia to suggest chronic cortical infarction. No evidence for acute intracranial hemorrhage. No mass lesion, midline shift or mass effect. No hydrocephalus or extra-axial fluid collection. Pituitary gland suprasellar region within normal limits. Midline structures intact. Vascular: Major intracranial vascular flow voids are maintained. Skull and upper cervical spine: Craniocervical junction within normal limits. Bone marrow signal intensity normal. No scalp soft tissue abnormality. Sinuses/Orbits: Patient status post bilateral ocular lens replacement. Moderate mucosal thickening noted within the right greater than left maxillary sinuses, chronic in appearance. Left sphenoid sinus retention cyst noted as well. Additional scattered mucosal thickening noted within the ethmoidal air cells. Trace right mastoid effusion noted, of doubtful significance. Other: None. MRA HEAD  FINDINGS ANTERIOR CIRCULATION: Examination degraded by motion artifact. Visualized distal cervical segments of the internal carotid arteries are patent with antegrade flow. Petrous, cavernous, and supraclinoid ICAs patent without appreciable flow-limiting stenosis or other abnormality. There is an apparent 3 mm focal outpouching extending superiorly from the right ICA terminus at the origin of the right A1, which could reflect focal vessel tortuosity or possibly small aneurysm, difficult to be certain on this motion degraded exam (series 5, image 111). A1 otherwise segments patent bilaterally. Grossly normal anterior communicating artery complex. Suspected multifocal moderate left A2 and A3 stenoses (series 1057, image 17). Right ACA patent to its distal aspect without appreciable stenosis. Right M1 widely patent proximally. There is a focal severe distal right M1 stenosis just prior to the bifurcation (series 1057, image 9). Right MCA branches perfused distally, although demonstrate diffuse small vessel atheromatous irregularity. Left M1 patent without stenosis. Left MCA branches well perfused, although demonstrate diffuse small vessel atheromatous irregularity. POSTERIOR CIRCULATION: Left vertebral artery dominant and patent to the vertebrobasilar junction without stenosis. Right vertebral artery somewhat diminutive and irregular  but is grossly patent as well. Neither PICA well visualized. Basilar patent to its distal aspect without stenosis. Superior cerebral arteries patent bilaterally. Both PCAs primarily supplied via the basilar. Moderate to severe stenoses noting the mid left P2 segment (series 1045, image 6). Probable short-segment moderate distal right P2 stenosis (series 1045, image 17). Both PCAs are perfused distally, although demonstrate distal small vessel atheromatous irregularity. MRA NECK FINDINGS Examination severely limited by extensive motion artifact, lack of IV contrast, and patient's inability  to tolerate the full length of the exam. Visualized aortic arch grossly within normal limits for caliber. Normal branching pattern seen about the arch. No obvious flow-limiting stenosis about the origin the great vessels, although evaluation fairly limited. Right CCA grossly patent to the bifurcation without stenosis. Atheromatous irregularity with suspected at least mild approximate 30% stenosis at the origin of the right ICA (series 7, image 9). Right ICA otherwise patent distally to the skull base without stenosis or occlusion. Partially visualized left CCA patent to the bifurcation. No obvious flow-limiting stenosis about the left bifurcation (series 7, image 19). Left ICA patent distally to the skull base without stenosis or occlusion. Neither origin of either vertebral artery is well visualized. The dominant left vertebral artery appears grossly patent within the neck (series 7, image 17). The diminutive right vertebral artery is not well visualized, and could be possibly occluded, although it appears to be grossly patent at the skull base on corresponding MRA of the head. Evaluation for possible vertebral stenosis or other pathology limited on this exam. IMPRESSION: MRI HEAD IMPRESSION: 1. Technically limited exam due to motion artifact and patient's inability to tolerate the full length of the exam. 2. Patchy multifocal small volume acute ischemic infarcts involving the cortical and subcortical right parieto-occipital region and right insula. No associated hemorrhage or mass effect. 3. Underlying age-related cerebral atrophy with moderate to severe chronic microvascular ischemic disease, with multiple remote lacunar infarcts involving the bilateral basal ganglia, thalami, and pons. MRA HEAD IMPRESSION: 1. Technically limited exam due to extensive motion artifact. 2. No large vessel occlusion. 3. Severe distal right M1 stenosis, with additional moderate to severe atheromatous stenoses involving the left ACA  and PCAs as above. Distal small vessel atheromatous irregularity. 4. Question 3 mm focal outpouching extending superiorly from the right ICA terminus at the origin of the right A1, which could reflect focal vessel tortuosity or possibly small aneurysm. Difficult to be certain on this motion degraded exam. Attention at follow-up recommended. MRA NECK IMPRESSION: 1. Severely limited exam due to extensive motion artifact, lack of IV contrast, and patient's inability to tolerate the full length of the exam. 2. Approximate 30% atheromatous stenosis at the right bifurcation/proximal right ICA. 3. Gross patency of the left carotid artery system, with no appreciable hemodynamically significant stenosis. 4. Gross patency of the dominant left vertebral artery. Right vertebral artery not visualized, and may be occluded, although appears grossly patent at the skull base on corresponding MRA of the head. Electronically Signed   By: Jeannine Boga M.D.   On: 12/05/2019 21:43   MR BRAIN WO CONTRAST  Result Date: 12/05/2019 CLINICAL DATA:  Initial evaluation for acute TIA. Speech abnormality with left arm numbness. EXAM: MRI HEAD WITHOUT CONTRAST MRA HEAD WITHOUT CONTRAST MRA NECK WITHOUT CONTRAST TECHNIQUE: Multiplanar, multiecho pulse sequences of the brain and surrounding structures were obtained without intravenous contrast. Angiographic images of the Circle of Willis were obtained using MRA technique without intravenous contrast. Angiographic images of the neck were obtained  using MRA technique without intravenous contrast. Carotid stenosis measurements (when applicable) are obtained utilizing NASCET criteria, using the distal internal carotid diameter as the denominator. COMPARISON:  Prior CT from earlier the same day. FINDINGS: MRI HEAD FINDINGS Brain: Examination technically limited by motion artifact and patient's inability to tolerate the full length of the exam. Generalized age-related cerebral atrophy. Patchy  and confluent T2/FLAIR hyperintensity within the periventricular deep white matter both cerebral hemispheres most consistent with chronic small vessel ischemic disease, moderate to severe nature. Multiple remote lacunar infarcts present about the bilateral basal ganglia, thalami, and pons. Few remote lacunar infarcts noted involving the body of the corpus callosum and hemispheric cerebral white matter as well. Patchy multifocal ischemic infarcts seen involving the cortical and subcortical aspect of the right parieto-occipital region (series 5, image 84). Additional 7 mm acute ischemic infarcts seen involving the right insula/subinsular white matter (series 5, image 77). No associated hemorrhage or mass effect. No other diffusion abnormality to suggest acute or subacute ischemia. Gray-white matter differentiation otherwise maintained. No other areas of encephalomalacia to suggest chronic cortical infarction. No evidence for acute intracranial hemorrhage. No mass lesion, midline shift or mass effect. No hydrocephalus or extra-axial fluid collection. Pituitary gland suprasellar region within normal limits. Midline structures intact. Vascular: Major intracranial vascular flow voids are maintained. Skull and upper cervical spine: Craniocervical junction within normal limits. Bone marrow signal intensity normal. No scalp soft tissue abnormality. Sinuses/Orbits: Patient status post bilateral ocular lens replacement. Moderate mucosal thickening noted within the right greater than left maxillary sinuses, chronic in appearance. Left sphenoid sinus retention cyst noted as well. Additional scattered mucosal thickening noted within the ethmoidal air cells. Trace right mastoid effusion noted, of doubtful significance. Other: None. MRA HEAD FINDINGS ANTERIOR CIRCULATION: Examination degraded by motion artifact. Visualized distal cervical segments of the internal carotid arteries are patent with antegrade flow. Petrous, cavernous,  and supraclinoid ICAs patent without appreciable flow-limiting stenosis or other abnormality. There is an apparent 3 mm focal outpouching extending superiorly from the right ICA terminus at the origin of the right A1, which could reflect focal vessel tortuosity or possibly small aneurysm, difficult to be certain on this motion degraded exam (series 5, image 111). A1 otherwise segments patent bilaterally. Grossly normal anterior communicating artery complex. Suspected multifocal moderate left A2 and A3 stenoses (series 1057, image 17). Right ACA patent to its distal aspect without appreciable stenosis. Right M1 widely patent proximally. There is a focal severe distal right M1 stenosis just prior to the bifurcation (series 1057, image 9). Right MCA branches perfused distally, although demonstrate diffuse small vessel atheromatous irregularity. Left M1 patent without stenosis. Left MCA branches well perfused, although demonstrate diffuse small vessel atheromatous irregularity. POSTERIOR CIRCULATION: Left vertebral artery dominant and patent to the vertebrobasilar junction without stenosis. Right vertebral artery somewhat diminutive and irregular but is grossly patent as well. Neither PICA well visualized. Basilar patent to its distal aspect without stenosis. Superior cerebral arteries patent bilaterally. Both PCAs primarily supplied via the basilar. Moderate to severe stenoses noting the mid left P2 segment (series 1045, image 6). Probable short-segment moderate distal right P2 stenosis (series 1045, image 17). Both PCAs are perfused distally, although demonstrate distal small vessel atheromatous irregularity. MRA NECK FINDINGS Examination severely limited by extensive motion artifact, lack of IV contrast, and patient's inability to tolerate the full length of the exam. Visualized aortic arch grossly within normal limits for caliber. Normal branching pattern seen about the arch. No obvious flow-limiting stenosis about  the  origin the great vessels, although evaluation fairly limited. Right CCA grossly patent to the bifurcation without stenosis. Atheromatous irregularity with suspected at least mild approximate 30% stenosis at the origin of the right ICA (series 7, image 9). Right ICA otherwise patent distally to the skull base without stenosis or occlusion. Partially visualized left CCA patent to the bifurcation. No obvious flow-limiting stenosis about the left bifurcation (series 7, image 19). Left ICA patent distally to the skull base without stenosis or occlusion. Neither origin of either vertebral artery is well visualized. The dominant left vertebral artery appears grossly patent within the neck (series 7, image 17). The diminutive right vertebral artery is not well visualized, and could be possibly occluded, although it appears to be grossly patent at the skull base on corresponding MRA of the head. Evaluation for possible vertebral stenosis or other pathology limited on this exam. IMPRESSION: MRI HEAD IMPRESSION: 1. Technically limited exam due to motion artifact and patient's inability to tolerate the full length of the exam. 2. Patchy multifocal small volume acute ischemic infarcts involving the cortical and subcortical right parieto-occipital region and right insula. No associated hemorrhage or mass effect. 3. Underlying age-related cerebral atrophy with moderate to severe chronic microvascular ischemic disease, with multiple remote lacunar infarcts involving the bilateral basal ganglia, thalami, and pons. MRA HEAD IMPRESSION: 1. Technically limited exam due to extensive motion artifact. 2. No large vessel occlusion. 3. Severe distal right M1 stenosis, with additional moderate to severe atheromatous stenoses involving the left ACA and PCAs as above. Distal small vessel atheromatous irregularity. 4. Question 3 mm focal outpouching extending superiorly from the right ICA terminus at the origin of the right A1, which could  reflect focal vessel tortuosity or possibly small aneurysm. Difficult to be certain on this motion degraded exam. Attention at follow-up recommended. MRA NECK IMPRESSION: 1. Severely limited exam due to extensive motion artifact, lack of IV contrast, and patient's inability to tolerate the full length of the exam. 2. Approximate 30% atheromatous stenosis at the right bifurcation/proximal right ICA. 3. Gross patency of the left carotid artery system, with no appreciable hemodynamically significant stenosis. 4. Gross patency of the dominant left vertebral artery. Right vertebral artery not visualized, and may be occluded, although appears grossly patent at the skull base on corresponding MRA of the head. Electronically Signed   By: Jeannine Boga M.D.   On: 12/05/2019 21:43   CT HEAD CODE STROKE WO CONTRAST  Result Date: 12/05/2019 CLINICAL DATA:  Code stroke. 69 year old female with abnormal speech and left arm numbness. EXAM: CT HEAD WITHOUT CONTRAST TECHNIQUE: Contiguous axial images were obtained from the base of the skull through the vertex without intravenous contrast. COMPARISON:  Brain MRI 06/21/2019. FINDINGS: Brain: Numerous chronic lacunar infarcts in the bilateral deep gray matter nuclei. Chronic lacunar infarcts in the brainstem. Associated Patchy and confluent bilateral cerebral white matter hypodensity. No midline shift, ventriculomegaly, mass effect, evidence of mass lesion, intracranial hemorrhage or evidence of cortically based acute infarction. No definite cortical encephalomalacia. Vascular: Calcified atherosclerosis at the skull base. No suspicious intracranial vascular hyperdensity. Skull: No acute osseous abnormality identified. Sinuses/Orbits: Chronic right maxillary sinusitis. Stable left sphenoid sinus retention cysts. Tympanic cavities and mastoids remain well aerated. Other: No acute orbit or scalp soft tissue finding. ASPECTS [facility] Early CT Score) Total score  (0-10 with 10 being normal): 10 IMPRESSION: 1. Severe chronic small-vessel ischemia. No acute cortically based infarct or acute intracranial hemorrhage identified. ASPECTS 10. 2. These results were communicated to  Dr. Bartholome Bill at 7:48 pm on 12/05/2019 by text page via the [facility] messaging system. 3. Chronic right maxillary sinusitis. Electronically Signed   By: Genevie Ann M.D.   On: 12/05/2019 19:49    EKG: Independently reviewed.  Normal sinus rhythm.  Assessment/Plan Principal Problem:   TIA (transient ischemic attack) Active Problems:   Type 2 diabetes mellitus with hyperlipidemia (HCC)   Essential hypertension   Normocytic anemia   History of renal cell carcinoma    1. Acute CVA -discussed with neurologist.  Patient is on aspirin Plavix statins check hemoglobin A1c lipid panel neurochecks patient passed swallow.  Physical therapy consult.  Check 2D echo. 2. Acute on chronic kidney disease stage III creatinine mildly elevated from baseline was receiving fluid bolus.  Follow metabolic panel. 3. Hypertension uncontrolled follow-up for pulmonary hypertension.  Per patient's daughter patient antihypertensives were recently increased but medications were not changed yet. 4. Diabetes mellitus type 2 on Lantus 80 units at bedtime.  Follows with endocrinologist.  Follow hemoglobin A1c. 5. Anemia likely from renal disease appears to be chronic follow CBC.   DVT prophylaxis: Lovenox. Code Status: Full code. Family Communication: Patient's daughter. Disposition Plan: Home. Consults called: Neurology. Admission status: Observation.   Rise Patience MD [facility] Pager [phone].  If 7PM-7AM, please contact night-coverage [email or web address] Password [email or web address]  12/05/2019, 10:02 PM

## 2019-12-05 NOTE — ED Triage Notes (Signed)
Pt BIB daughter who reports pt has hx of stroke. Daughter states pt developed slurred speech at 0500 this am, but resolved, daughter states slurred speech returned approx. 2-3 hours ago. Pt also c/o left arm numbness. A&O x 4, no drift noted.

## 2019-12-05 NOTE — Code Documentation (Signed)
Responded to Code Stroke called at 1936 for slurred speech on pt already in triage, [id number]. Pt arrived to ED at [place]. CBG-188, NIH-1 for L arm and L leg sensory deficit(per family this is baseline for pt), CT head negative. Pt placed on TIA alert. Plan for ASA, MRI, and admit for workup.

## 2019-12-05 NOTE — ED Notes (Signed)
CBG- 188 

## 2019-12-05 NOTE — ED Provider Notes (Addendum)
[facility] EMERGENCY DEPARTMENT Provider Note   CSN: [id number] Arrival date & time: 12/05/19  1914  An emergency department physician performed an initial assessment on this suspected stroke patient at 1930 (In Triage).  History Chief Complaint  Patient presents with  . Aphasia    Briana Garrison is a 69 y.o. female.  Per report from family, patient had sudden onset slurred speech this morning at around 5 AM that resolved.  At around 4 PM, slurred speech, facial droop, and unstable gait developed.  In triage, unstable gait the family insists is not baseline was noted, so code stroke was activated with last known well around 4 PM.  The history is provided by the patient and a relative.  Neurologic Problem This is a new problem. The current episode started 3 to 5 hours ago. The problem occurs constantly. The problem has not changed since onset.Pertinent negatives include no chest pain, no abdominal pain and no shortness of breath. Nothing aggravates the symptoms. Nothing relieves the symptoms. She has tried nothing for the symptoms.       Past Medical History:  Diagnosis Date  . Cancer of kidney ([place]) 05/14/2009  . Diabetes mellitus   . Endometrial mass   . GERD (gastroesophageal reflux disease)   . Hypercholesterolemia   . Hypertension   . Internal hemorrhoids    S/P colonoscopy 06/10/00 by Dr. Lajoyce Corners  . Shingles     Patient Active Problem List   Diagnosis Date Noted  . TIA (transient ischemic attack) 12/05/2019  . Acute CVA (cerebrovascular accident) ([place]) 06/21/2019  . History of renal cell carcinoma 06/21/2019  . Left ventricular hypertrophy 11/26/2018  . CKD stage G3b/A3, GFR 30-44 and albumin creatinine ratio >300 mg/g ([place]) 11/26/2018  . Change in stool 08/03/2018  . Choledocholithiasis with obstruction 02/07/2017  . AKI (acute kidney injury) ([place]) 02/07/2017  . Acute kidney injury superimposed on CKD ([place]) 06/23/2011  . Hypoglycemia associated  with diabetes ([place]) 06/23/2011  . Type 2 diabetes mellitus with hyperlipidemia ([place]) 06/23/2011  . Essential hypertension 06/23/2011  . Hyperlipidemia 06/23/2011  . Normocytic anemia 06/23/2011  . UTI (lower urinary tract infection) 06/23/2011    Past Surgical History:  Procedure Laterality Date  . BIOPSY  09/06/2018   Procedure: BIOPSY;  Surgeon: Rogene Houston, MD;  Location: [facility];  Service: Endoscopy;;  . CHOLECYSTECTOMY  11/13/08  . COLONOSCOPY N/A 09/06/2018   Procedure: COLONOSCOPY;  Surgeon: Rogene Houston, MD;  Location: [facility];  Service: Endoscopy;  Laterality: N/A;  730  . CYSTOSCOPY KIDNEY W/ URETERAL GUIDE WIRE  05/14/09  . Diagnostic hysteroscopy, D&C, endometrial polypectomy  07/01/2005  . LIPECTOMY    . POLYPECTOMY  09/06/2018   Procedure: POLYPECTOMY;  Surgeon: Rogene Houston, MD;  Location: [facility];  Service: Endoscopy;;     OB History   No obstetric history on file.     Family History  Problem Relation Age of Onset  . Ovarian cancer Mother   . Stroke Mother   . Stroke Father     Social History   Tobacco Use  . Smoking status: Never Smoker  . Smokeless tobacco: Never Used  Vaping Use  . Vaping Use: Never used  Substance Use Topics  . Alcohol use: No  . Drug use: No    Home Medications Prior to Admission medications   Medication Sig Start Date End Date Taking? Authorizing Provider  acetaminophen (TYLENOL) 500 MG tablet Take 500 mg by mouth  every 6 (six) hours as needed for mild pain or headache.    Yes [provider]  albuterol (PROAIR HFA) 108 (90 Base) MCG/ACT inhaler Inhale 2 puffs into the lungs every 6 (six) hours as needed for wheezing or shortness of breath.   Yes [provider]  carvedilol (COREG) 12.5 MG tablet Take 12.5 mg by mouth 2 (two) times daily with a meal.   Yes [provider]  cetirizine (ZYRTEC) 10 MG tablet Take 10 mg by mouth daily as needed for allergies.   Yes [provider]  Cholecalciferol (VITAMIN D-3) 25 MCG (1000 UT) CAPS Take 1,000 Units by mouth daily.    Yes [provider]  cloNIDine (CATAPRES) 0.1 MG tablet Take 0.2 mg by mouth 2 (two) times daily.    Yes [provider]  clopidogrel (PLAVIX) 75 MG tablet Take 1 tablet (75 mg total) by mouth daily. 10/06/19  Yes Garvin Fila, MD  Dulaglutide (TRULICITY) 1.5 ZO/1.0RU SOPN Inject 1.5 mg into the skin every Tuesday.    Yes [provider]  furosemide (LASIX) 20 MG tablet Take 40 mg by mouth 2 (two) times daily.  09/04/19  Yes [provider]  gabapentin (NEURONTIN) 100 MG capsule Take 100 mg by mouth daily as needed (shoulder pain).   Yes [provider]  glucose 4 GM chewable tablet Chew 1 tablet by mouth once as needed for low blood sugar.   Yes [provider]  insulin aspart (NOVOLOG FLEXPEN) 100 UNIT/ML FlexPen Inject into the skin 3 (three) times daily with meals. Per sliding scale - based on CBG   Yes [provider]  insulin glargine (LANTUS) 100 unit/mL SOPN Inject 18 Units into the skin at bedtime.   Yes [provider]  rosuvastatin (CRESTOR) 40 MG tablet Take 40 mg by mouth daily. 08/30/19  Yes [provider]  senna-docusate (SENOKOT-S) 8.6-50 MG tablet Take 1 tablet by mouth daily as needed for mild constipation.   Yes [provider]  telmisartan (MICARDIS) 20 MG tablet Take 20 mg by mouth daily.  08/30/19  Yes [provider]  vitamin B-12 (CYANOCOBALAMIN) 1000 MCG tablet Take 1,000 mcg by mouth daily.   Yes [provider]  atorvastatin (LIPITOR) 80 MG tablet Take 1 tablet (80 mg total) by mouth daily. 06/24/19 07/24/19  Darliss Cheney, MD  carvedilol (COREG) 25 MG tablet Take 25 mg by mouth 2 (two) times daily. 12/01/19   [provider]  Dulaglutide (TRULICITY) 3 EA/5.4UJ SOPN Inject 3 mg into the skin every Tuesday.    [provider]    Allergies    Amlodipine  and Tramadol  Review of Systems   Review of Systems  Constitutional: Positive for fatigue. Negative for chills and fever.  HENT: Negative for ear pain and sore throat.   Eyes: Negative for pain and visual disturbance.  Respiratory: Negative for cough and shortness of breath.   Cardiovascular: Negative for chest pain and palpitations.  Gastrointestinal: Negative for abdominal pain and vomiting.  Genitourinary: Negative for dysuria and hematuria.  Musculoskeletal: Positive for gait problem. Negative for arthralgias and back pain.  Skin: Negative for color change and rash.  Neurological: Positive for speech difficulty. Negative for seizures and syncope.  All other systems reviewed and are negative.   Physical Exam Updated Vital Signs BP (!) 170/73 (BP Location: Left Arm)   Pulse 77   Resp 19   Ht 5\' 1"  (1.549 m)   Wt 84.4 kg  SpO2 100%   BMI 35.14 kg/m   Physical Exam Vitals and nursing note reviewed.  Constitutional:      Appearance: She is well-developed. She is obese. She is not ill-appearing, toxic-appearing or diaphoretic.  HENT:     Head: Normocephalic and atraumatic.     Mouth/Throat:     Mouth: Mucous membranes are moist.     Pharynx: Oropharynx is clear.  Eyes:     Conjunctiva/sclera: Conjunctivae normal.     Pupils: Pupils are equal, round, and reactive to light.  Cardiovascular:     Rate and Rhythm: Normal rate and regular rhythm.     Heart sounds: No murmur heard.  No gallop.   Pulmonary:     Effort: Pulmonary effort is normal. No respiratory distress.     Breath sounds: Normal breath sounds. No wheezing or rhonchi.  Abdominal:     Palpations: Abdomen is soft.     Tenderness: There is no abdominal tenderness.  Musculoskeletal:     Cervical back: Neck supple.  Skin:    General: Skin is warm and dry.  Neurological:     Mental Status: She is alert.     Cranial Nerves: Cranial nerves are intact.     Sensory: Sensory deficit present.     Comments: Mental  status: alert and oriented to person, place, time, situation. Speech: Speech is clear and language is not aphasic Fund of knowledge: Intact  Cranial Nerves:  II: Intact to confrontation bilaterally III, IV, VI: EOMI, no nystagmus V: face sensation intact, good masseter strength VII: no facial droop or weakness VIII: gross hearing intact bilaterally IX/XI: palate elevates symmetrically XII: tongue protrudes symmetrically, no deviation  Strength: 5/5 and symmetric in BUE and BLE. No pronation or drift. Tone: normal tone, no tremors Coordination: Intact finger to nose and heel to shin. Sensation: decreased in L arm and leg.  Romberg not assessed due to acuity.  Gait: Routine gait not assessed due to acuity     ED Results / Procedures / Treatments   Labs (all labs ordered are listed, but only abnormal results are displayed) Labs Reviewed  CBC - Abnormal; Notable for the following components:      Result Value   RBC 3.79 (*)    Hemoglobin 10.9 (*)    HCT 34.4 (*)    All other components within normal limits  COMPREHENSIVE METABOLIC PANEL - Abnormal; Notable for the following components:   Sodium 133 (*)    CO2 21 (*)    Glucose, Bld 204 (*)    BUN 48 (*)    Creatinine, Ser 2.29 (*)    Total Protein 6.4 (*)    GFR, Estimated 23 (*)    All other components within normal limits  I-STAT CHEM 8, ED - Abnormal; Notable for the following components:   BUN 46 (*)    Creatinine, Ser 2.70 (*)    Glucose, Bld 199 (*)    Hemoglobin 10.9 (*)    HCT 32.0 (*)    All other components within normal limits  CBG MONITORING, ED - Abnormal; Notable for the following components:   Glucose-Capillary 188 (*)    All other components within normal limits  RESPIRATORY PANEL BY RT PCR (FLU A&B, COVID)  PROTIME-INR  APTT  DIFFERENTIAL  RAPID URINE DRUG SCREEN, HOSP PERFORMED  CBG MONITORING, ED    EKG None  Radiology CT HEAD CODE STROKE WO CONTRAST  Result Date: 12/05/2019 CLINICAL  DATA:  Code stroke. 69 year old female with abnormal  speech and left arm numbness. EXAM: CT HEAD WITHOUT CONTRAST TECHNIQUE: Contiguous axial images were obtained from the base of the skull through the vertex without intravenous contrast. COMPARISON:  Brain MRI 06/21/2019. FINDINGS: Brain: Numerous chronic lacunar infarcts in the bilateral deep gray matter nuclei. Chronic lacunar infarcts in the brainstem. Associated Patchy and confluent bilateral cerebral white matter hypodensity. No midline shift, ventriculomegaly, mass effect, evidence of mass lesion, intracranial hemorrhage or evidence of cortically based acute infarction. No definite cortical encephalomalacia. Vascular: Calcified atherosclerosis at the skull base. No suspicious intracranial vascular hyperdensity. Skull: No acute osseous abnormality identified. Sinuses/Orbits: Chronic right maxillary sinusitis. Stable left sphenoid sinus retention cysts. Tympanic cavities and mastoids remain well aerated. Other: No acute orbit or scalp soft tissue finding. ASPECTS [facility] Stroke Program Early CT Score) Total score (0-10 with 10 being normal): 10 IMPRESSION: 1. Severe chronic small-vessel ischemia. No acute cortically based infarct or acute intracranial hemorrhage identified. ASPECTS 10. 2. These results were communicated to Dr. Bartholome Bill at 7:48 pm on 12/05/2019 by text page via the [facility] messaging system. 3. Chronic right maxillary sinusitis. Electronically Signed   By: Genevie Ann M.D.   On: 12/05/2019 19:49    Procedures Procedures (including critical care time)  Medications Ordered in ED Medications  sodium chloride flush (NS) 0.9 % injection 3 mL (has no administration in time range)  lactated ringers bolus 500 mL (has no administration in time range)  acetaminophen (TYLENOL) tablet 650 mg (has no administration in time range)  aspirin chewable tablet 324 mg (324 mg Oral Given 12/05/19 2013)    ED Course  I have reviewed the triage vital signs  and the nursing notes.  Pertinent labs & imaging results that were available during my care of the patient were reviewed by me and considered in my medical decision making (see chart for details).    MDM Rules/Calculators/A&P                          The patient is a 69yo female, PMH prior CVA, renal CA who presents to the ED via POV for concern for stroke. Code stroke called in triage by Dr. Tyrone Nine  On my initial evaluation, the patient is hemodynamically stable, afebrile, nontoxic-appearing. Physical exam remarkable for decreased sensation on L, other symptoms seem to have resolved per patient and daughter.  Differentials considered include CVA, TIA. CT head showed no acute stroke.  Neurology was at bedside and recommended aspirin 325 mg and MRIs for further assessment of possible TIA.  Preliminary labs remarkable for AKI with creatinine 2.7.  Spoke again with patient and daughter, and patient had flu shot about 1 week ago and has felt fatigued and generalized weak and unwell since then.  She reports she still been tolerating p.o. intake and denies fevers or other symptoms.  However, patient will require admission to medicine for further TIA evaluation and AKI evaluation.   Neurology requesting admission for TIA evaluation.  Consulted hospitalist for admission for TIA and AKI evaluation.  Currently unclear etiology of patient's AKI.  Patient accepted to hospitalist service.  No further acute events while under my care.  The care of this patient was overseen by Dr. Laverta Baltimore, who agreed with evaluation and plan of care.   Final Clinical Impression(s) / ED Diagnoses Final diagnoses:  TIA (transient ischemic attack)  AKI (acute kidney injury) ([place])    Rx / DC Orders ED Discharge Orders    None  Launa Flight, MD 12/05/19 2123    Margette Fast, MD 12/08/19 [phone]

## 2019-12-06 ENCOUNTER — Observation Stay (HOSPITAL_BASED_OUTPATIENT_CLINIC_OR_DEPARTMENT_OTHER): Payer: Medicare PPO

## 2019-12-06 ENCOUNTER — Encounter (HOSPITAL_COMMUNITY): Payer: Self-pay | Admitting: Internal Medicine

## 2019-12-06 DIAGNOSIS — D649 Anemia, unspecified: Secondary | ICD-10-CM

## 2019-12-06 DIAGNOSIS — I639 Cerebral infarction, unspecified: Secondary | ICD-10-CM

## 2019-12-06 DIAGNOSIS — Z794 Long term (current) use of insulin: Secondary | ICD-10-CM | POA: Diagnosis not present

## 2019-12-06 DIAGNOSIS — N183 Chronic kidney disease, stage 3 unspecified: Secondary | ICD-10-CM | POA: Diagnosis not present

## 2019-12-06 DIAGNOSIS — I10 Essential (primary) hypertension: Secondary | ICD-10-CM

## 2019-12-06 DIAGNOSIS — G459 Transient cerebral ischemic attack, unspecified: Secondary | ICD-10-CM | POA: Diagnosis not present

## 2019-12-06 DIAGNOSIS — N179 Acute kidney failure, unspecified: Secondary | ICD-10-CM | POA: Diagnosis not present

## 2019-12-06 DIAGNOSIS — E119 Type 2 diabetes mellitus without complications: Secondary | ICD-10-CM | POA: Diagnosis not present

## 2019-12-06 DIAGNOSIS — I6389 Other cerebral infarction: Secondary | ICD-10-CM

## 2019-12-06 DIAGNOSIS — I6300 Cerebral infarction due to thrombosis of unspecified precerebral artery: Secondary | ICD-10-CM | POA: Diagnosis not present

## 2019-12-06 DIAGNOSIS — E1165 Type 2 diabetes mellitus with hyperglycemia: Secondary | ICD-10-CM

## 2019-12-06 LAB — HEMOGLOBIN A1C
Hgb A1c MFr Bld: 8.4 % — ABNORMAL HIGH (ref 4.8–5.6)
Hgb A1c MFr Bld: 8.4 % — ABNORMAL HIGH (ref 4.8–5.6)
Mean Plasma Glucose: 194.38 mg/dL
Mean Plasma Glucose: 194.38 mg/dL

## 2019-12-06 LAB — LIPID PANEL
Cholesterol: 119 mg/dL (ref 0–200)
HDL: 40 mg/dL — ABNORMAL LOW (ref >40)
LDL Cholesterol: 49 mg/dL (ref 0–99)
Total CHOL/HDL Ratio: 3 RATIO
Triglycerides: 150 mg/dL — ABNORMAL HIGH (ref <150)
VLDL: 30 mg/dL (ref 0–40)

## 2019-12-06 LAB — CBC
HCT: 32.4 % — ABNORMAL LOW (ref 36.0–46.0)
Hemoglobin: 10.2 g/dL — ABNORMAL LOW (ref 12.0–15.0)
MCH: 28.7 pg (ref 26.0–34.0)
MCHC: 31.5 g/dL (ref 30.0–36.0)
MCV: 91 fL (ref 80.0–100.0)
Platelets: 240 10*3/uL (ref 150–400)
RBC: 3.56 MIL/uL — ABNORMAL LOW (ref 3.87–5.11)
RDW: 14.3 % (ref 11.5–15.5)
WBC: 6.3 10*3/uL (ref 4.0–10.5)
nRBC: 0 % (ref 0.0–0.2)

## 2019-12-06 LAB — ECHOCARDIOGRAM COMPLETE
Area-P 1/2: 3.17 cm2
Height: 61 in
S' Lateral: 2.4 cm
Weight: 2976 oz

## 2019-12-06 LAB — GLUCOSE, CAPILLARY: Glucose-Capillary: 187 mg/dL — ABNORMAL HIGH (ref 70–99)

## 2019-12-06 LAB — CREATININE, SERUM
Creatinine, Ser: 2.12 mg/dL — ABNORMAL HIGH (ref 0.44–1.00)
GFR, Estimated: 25 mL/min — ABNORMAL LOW (ref >60)

## 2019-12-06 LAB — CBG MONITORING, ED
Glucose-Capillary: 159 mg/dL — ABNORMAL HIGH (ref 70–99)
Glucose-Capillary: 127 mg/dL — ABNORMAL HIGH (ref 70–99)
Glucose-Capillary: 212 mg/dL — ABNORMAL HIGH (ref 70–99)

## 2019-12-06 MED ORDER — OXYCODONE HCL 5 MG PO TABS
2.5000 mg | ORAL_TABLET | Freq: Once | ORAL | Status: AC
  Administered 2019-12-06: 2.5 mg via ORAL
  Filled 2019-12-06: qty 1

## 2019-12-06 NOTE — Progress Notes (Signed)
  Echocardiogram 2D Echocardiogram has been performed.  Jennette Dubin 12/06/2019, 11:53 AM

## 2019-12-06 NOTE — Progress Notes (Signed)
PROGRESS NOTE    Briana Garrison  VOZ:[id number] DOB: November 01, 1950 DOA: 12/05/2019 PCP: [name], MD    Brief Narrative: This 69 years old female with history of  stroke with left-sided weakness in May 2021 presented to the emergency department with slurring of speech which resolved.  Patient was admitted for CVA, TIA work-up.  CT shows no acute abnormality.  MRI brain shows acute CVA involving the right parieto-occipital and right insular region.  MRA of the brain and neck does not show any large vessel obstruction.  Neurology was consulted patient seemed not a candidate for TPA.  Patient is admitted for a stroke work-up.  Assessment & Plan:   Principal Problem:   TIA (transient ischemic attack) Active Problems:   Type 2 diabetes mellitus with hyperlipidemia (HCC)   Essential hypertension   Normocytic anemia   History of renal cell carcinoma   1. Acute CVA - Patient is found to have a stroke.  Neurology consulted. Patient is on aspirin, Plavix statins. Hb A1c 8.4,  Lipid profile LDL 49   2D Echo : EF 60-65%.  Neurology recommended dual antiplatelet therapy for 3 months followed by Plavix alone.  Aggressive risk factor modification.  PT recommended outpatient PT.   2. Acute on chronic kidney disease stage III: creatinine mildly elevated from baseline.  Continue gentle IV fluids.  Avoid nephrotoxic medications Follow metabolic panel.  3. Hypertension: Per patient's daughter patient antihypertensives were recently increased but medications were not changed yet.      Permissive hypertension.  Gradually normalize in 5 to 7 days.  4. Diabetes mellitus type 2 : Continue Lantus 80 units at bedtime.  Follows with endocrinologist.  Hemoglobin A1c 8.6  5. Anemia likely from renal disease appears to be chronic follow CBC.    DVT prophylaxis: Lovenox Code Status: Full code Family Communication: Daughter was at bedside Disposition Plan:  Status is: Observation  The patient remains OBS  appropriate and will d/c before 2 midnights.  Dispo: The patient is from: Home              Anticipated d/c is to: Home              Anticipated d/c date is: 1 day              Patient currently is not medically stable to d/c.    Consultants:   Neurology  Procedures: CT head, MRI, MRA head and neck, echo, carotid duplex Antimicrobials: ( Anti-infectives (From admission, onward)   None     Subjective: Patient was seen and examined at bedside.  Overnight events noted.  Patient reports feeling better,  she has baseline left-sided weakness from prior stroke.  Objective: Vitals:   12/06/19 1430 12/06/19 1445 12/06/19 1500 12/06/19 1700  BP: 126/60 (!) 116/57 (!) 116/56 (!) 164/71  Pulse: 73 74  77  Resp: 16 (!) 24 15 20   Temp:      TempSrc:      SpO2: 100% 98%  93%  Weight:      Height:        Intake/Output Summary (Last 24 hours) at 12/06/2019 1716 Last data filed at 12/06/2019 1329 Gross per 24 hour  Intake --  Output 1201 ml  Net -1201 ml   Filed Weights   12/05/19 2015  Weight: 84.4 kg    Examination:  General exam: Appears calm and comfortable. Respiratory system: Clear to auscultation. Respiratory effort normal. Cardiovascular system: S1 & S2 heard, RRR. No JVD, murmurs,  rubs, gallops or clicks. No pedal edema. Gastrointestinal system: Abdomen is nondistended, soft and nontender. No organomegaly or masses felt. Normal bowel sounds heard. Central nervous system: Alert and oriented. No focal neurological deficits. Extremities: Left sided weakness  Skin: No rashes, lesions or ulcers Psychiatry: Judgement and insight appear normal. Mood & affect appropriate.     Data Reviewed: I have personally reviewed following labs and imaging studies  CBC: Recent Labs  Lab 12/05/19 1937 12/05/19 1953 12/06/19 0022  WBC 6.6  --  6.3  NEUTROABS 3.6  --   --   HGB 10.9* 10.9* 10.2*  HCT 34.4* 32.0* 32.4*  MCV 90.8  --  91.0  PLT 270  --  735   Basic Metabolic  Panel: Recent Labs  Lab 12/05/19 1937 12/05/19 1953 12/06/19 0022  NA 133* 138  --   K 4.8 4.7  --   CL 105 105  --   CO2 21*  --   --   GLUCOSE 204* 199*  --   BUN 48* 46*  --   CREATININE 2.29* 2.70* 2.12*  CALCIUM 8.9  --   --    GFR: Estimated Creatinine Clearance: 24.7 mL/min (A) (by C-G formula based on SCr of 2.12 mg/dL (H)). Liver Function Tests: Recent Labs  Lab 12/05/19 1937  AST 19  ALT 18  ALKPHOS 82  BILITOT 0.6  PROT 6.4*  ALBUMIN 3.5   No results for input(s): LIPASE, AMYLASE in the last 168 hours. No results for input(s): AMMONIA in the last 168 hours. Coagulation Profile: Recent Labs  Lab 12/05/19 1937  INR 1.0   Cardiac Enzymes: No results for input(s): CKTOTAL, CKMB, CKMBINDEX, TROPONINI in the last 168 hours. BNP (last 3 results) No results for input(s): PROBNP in the last 8760 hours. HbA1C: Recent Labs    12/06/19 0022 12/06/19 0246  HGBA1C 8.4* 8.4*   CBG: Recent Labs  Lab 12/05/19 1938 12/05/19 2352 12/06/19 0720 12/06/19 1218  GLUCAP 188* 212* 159* 127*   Lipid Profile: Recent Labs    12/06/19 0246  CHOL 119  HDL 40*  LDLCALC 49  TRIG 150*  CHOLHDL 3.0   Thyroid Function Tests: No results for input(s): TSH, T4TOTAL, FREET4, T3FREE, THYROIDAB in the last 72 hours. Anemia Panel: No results for input(s): VITAMINB12, FOLATE, FERRITIN, TIBC, IRON, RETICCTPCT in the last 72 hours. Sepsis Labs: No results for input(s): PROCALCITON, LATICACIDVEN in the last 168 hours.  Recent Results (from the past 240 hour(s))  Respiratory Panel by RT PCR (Flu A&B, Covid) - Nasopharyngeal Swab     Status: None   Collection Time: 12/05/19  8:02 PM   Specimen: Nasopharyngeal Swab  Result Value Ref Range Status   SARS Coronavirus 2 by RT PCR NEGATIVE NEGATIVE Final    Comment: (NOTE) SARS-CoV-2 target nucleic acids are NOT DETECTED.  The SARS-CoV-2 RNA is generally detectable in upper respiratoy specimens during the acute phase of  infection. The lowest concentration of SARS-CoV-2 viral copies this assay can detect is 131 copies/mL. A negative result does not preclude SARS-Cov-2 infection and should not be used as the sole basis for treatment or other patient management decisions. A negative result may occur with  improper specimen collection/handling, submission of specimen other than nasopharyngeal swab, presence of viral mutation(s) within the areas targeted by this assay, and inadequate number of viral copies (<131 copies/mL). A negative result must be combined with clinical observations, patient history, and epidemiological information. The expected result is Negative.  Fact Sheet for  Patients:  PinkCheek.be  Fact Sheet for Healthcare Providers:  GravelBags.it  This test is no t yet approved or cleared by the [place] FDA and  has been authorized for detection and/or diagnosis of SARS-CoV-2 by FDA under an Emergency Use Authorization (EUA). This EUA will remain  in effect (meaning this test can be used) for the duration of the COVID-19 declaration under Section 564(b)(1) of the Act, 21 U.S.C. section 360bbb-3(b)(1), unless the authorization is terminated or revoked sooner.     Influenza A by PCR NEGATIVE NEGATIVE Final   Influenza B by PCR NEGATIVE NEGATIVE Final    Comment: (NOTE) The Xpert Xpress SARS-CoV-2/FLU/RSV assay is intended as an aid in  the diagnosis of influenza from Nasopharyngeal swab specimens and  should not be used as a sole basis for treatment. Nasal washings and  aspirates are unacceptable for Xpert Xpress SARS-CoV-2/FLU/RSV  testing.  Fact Sheet for Patients: PinkCheek.be  Fact Sheet for Healthcare Providers: GravelBags.it  This test is not yet approved or cleared by the [place] FDA and  has been authorized for detection and/or diagnosis of SARS-CoV-2  by  FDA under an Emergency Use Authorization (EUA). This EUA will remain  in effect (meaning this test can be used) for the duration of the  Covid-19 declaration under Section 564(b)(1) of the Act, 21  U.S.C. section 360bbb-3(b)(1), unless the authorization is  terminated or revoked. Performed at [facility], [place] [address] [place], [place] [address]      Radiology Studies: MR ANGIO HEAD WO CONTRAST  Result Date: 12/05/2019 CLINICAL DATA:  Initial evaluation for acute TIA. Speech abnormality with left arm numbness. EXAM: MRI HEAD WITHOUT CONTRAST MRA HEAD WITHOUT CONTRAST MRA NECK WITHOUT CONTRAST TECHNIQUE: Multiplanar, multiecho pulse sequences of the brain and surrounding structures were obtained without intravenous contrast. Angiographic images of the Circle of Willis were obtained using MRA technique without intravenous contrast. Angiographic images of the neck were obtained using MRA technique without intravenous contrast. Carotid stenosis measurements (when applicable) are obtained utilizing NASCET criteria, using the distal internal carotid diameter as the denominator. COMPARISON:  Prior CT from earlier the same day. FINDINGS: MRI HEAD FINDINGS Brain: Examination technically limited by motion artifact and patient's inability to tolerate the full length of the exam. Generalized age-related cerebral atrophy. Patchy and confluent T2/FLAIR hyperintensity within the periventricular deep white matter both cerebral hemispheres most consistent with chronic small vessel ischemic disease, moderate to severe nature. Multiple remote lacunar infarcts present about the bilateral basal ganglia, thalami, and pons. Few remote lacunar infarcts noted involving the body of the corpus callosum and hemispheric cerebral white matter as well. Patchy multifocal ischemic infarcts seen involving the cortical and subcortical aspect of the right parieto-occipital region (series 5, image 84). Additional 7 mm acute  ischemic infarcts seen involving the right insula/subinsular white matter (series 5, image 77). No associated hemorrhage or mass effect. No other diffusion abnormality to suggest acute or subacute ischemia. Gray-white matter differentiation otherwise maintained. No other areas of encephalomalacia to suggest chronic cortical infarction. No evidence for acute intracranial hemorrhage. No mass lesion, midline shift or mass effect. No hydrocephalus or extra-axial fluid collection. Pituitary gland suprasellar region within normal limits. Midline structures intact. Vascular: Major intracranial vascular flow voids are maintained. Skull and upper cervical spine: Craniocervical junction within normal limits. Bone marrow signal intensity normal. No scalp soft tissue abnormality. Sinuses/Orbits: Patient status post bilateral ocular lens replacement. Moderate mucosal thickening noted within the right greater than left maxillary sinuses,  chronic in appearance. Left sphenoid sinus retention cyst noted as well. Additional scattered mucosal thickening noted within the ethmoidal air cells. Trace right mastoid effusion noted, of doubtful significance. Other: None. MRA HEAD FINDINGS ANTERIOR CIRCULATION: Examination degraded by motion artifact. Visualized distal cervical segments of the internal carotid arteries are patent with antegrade flow. Petrous, cavernous, and supraclinoid ICAs patent without appreciable flow-limiting stenosis or other abnormality. There is an apparent 3 mm focal outpouching extending superiorly from the right ICA terminus at the origin of the right A1, which could reflect focal vessel tortuosity or possibly small aneurysm, difficult to be certain on this motion degraded exam (series 5, image 111). A1 otherwise segments patent bilaterally. Grossly normal anterior communicating artery complex. Suspected multifocal moderate left A2 and A3 stenoses (series 1057, image 17). Right ACA patent to its distal aspect  without appreciable stenosis. Right M1 widely patent proximally. There is a focal severe distal right M1 stenosis just prior to the bifurcation (series 1057, image 9). Right MCA branches perfused distally, although demonstrate diffuse small vessel atheromatous irregularity. Left M1 patent without stenosis. Left MCA branches well perfused, although demonstrate diffuse small vessel atheromatous irregularity. POSTERIOR CIRCULATION: Left vertebral artery dominant and patent to the vertebrobasilar junction without stenosis. Right vertebral artery somewhat diminutive and irregular but is grossly patent as well. Neither PICA well visualized. Basilar patent to its distal aspect without stenosis. Superior cerebral arteries patent bilaterally. Both PCAs primarily supplied via the basilar. Moderate to severe stenoses noting the mid left P2 segment (series 1045, image 6). Probable short-segment moderate distal right P2 stenosis (series 1045, image 17). Both PCAs are perfused distally, although demonstrate distal small vessel atheromatous irregularity. MRA NECK FINDINGS Examination severely limited by extensive motion artifact, lack of IV contrast, and patient's inability to tolerate the full length of the exam. Visualized aortic arch grossly within normal limits for caliber. Normal branching pattern seen about the arch. No obvious flow-limiting stenosis about the origin the great vessels, although evaluation fairly limited. Right CCA grossly patent to the bifurcation without stenosis. Atheromatous irregularity with suspected at least mild approximate 30% stenosis at the origin of the right ICA (series 7, image 9). Right ICA otherwise patent distally to the skull base without stenosis or occlusion. Partially visualized left CCA patent to the bifurcation. No obvious flow-limiting stenosis about the left bifurcation (series 7, image 19). Left ICA patent distally to the skull base without stenosis or occlusion. Neither origin of  either vertebral artery is well visualized. The dominant left vertebral artery appears grossly patent within the neck (series 7, image 17). The diminutive right vertebral artery is not well visualized, and could be possibly occluded, although it appears to be grossly patent at the skull base on corresponding MRA of the head. Evaluation for possible vertebral stenosis or other pathology limited on this exam. IMPRESSION: MRI HEAD IMPRESSION: 1. Technically limited exam due to motion artifact and patient's inability to tolerate the full length of the exam. 2. Patchy multifocal small volume acute ischemic infarcts involving the cortical and subcortical right parieto-occipital region and right insula. No associated hemorrhage or mass effect. 3. Underlying age-related cerebral atrophy with moderate to severe chronic microvascular ischemic disease, with multiple remote lacunar infarcts involving the bilateral basal ganglia, thalami, and pons. MRA HEAD IMPRESSION: 1. Technically limited exam due to extensive motion artifact. 2. No large vessel occlusion. 3. Severe distal right M1 stenosis, with additional moderate to severe atheromatous stenoses involving the left ACA and PCAs as above. Distal small vessel  atheromatous irregularity. 4. Question 3 mm focal outpouching extending superiorly from the right ICA terminus at the origin of the right A1, which could reflect focal vessel tortuosity or possibly small aneurysm. Difficult to be certain on this motion degraded exam. Attention at follow-up recommended. MRA NECK IMPRESSION: 1. Severely limited exam due to extensive motion artifact, lack of IV contrast, and patient's inability to tolerate the full length of the exam. 2. Approximate 30% atheromatous stenosis at the right bifurcation/proximal right ICA. 3. Gross patency of the left carotid artery system, with no appreciable hemodynamically significant stenosis. 4. Gross patency of the dominant left vertebral artery. Right  vertebral artery not visualized, and may be occluded, although appears grossly patent at the skull base on corresponding MRA of the head. Electronically Signed   By: Jeannine Boga M.D.   On: 12/05/2019 21:43   MR MRA NECK WO CONTRAST  Result Date: 12/05/2019 CLINICAL DATA:  Initial evaluation for acute TIA. Speech abnormality with left arm numbness. EXAM: MRI HEAD WITHOUT CONTRAST MRA HEAD WITHOUT CONTRAST MRA NECK WITHOUT CONTRAST TECHNIQUE: Multiplanar, multiecho pulse sequences of the brain and surrounding structures were obtained without intravenous contrast. Angiographic images of the Circle of Willis were obtained using MRA technique without intravenous contrast. Angiographic images of the neck were obtained using MRA technique without intravenous contrast. Carotid stenosis measurements (when applicable) are obtained utilizing NASCET criteria, using the distal internal carotid diameter as the denominator. COMPARISON:  Prior CT from earlier the same day. FINDINGS: MRI HEAD FINDINGS Brain: Examination technically limited by motion artifact and patient's inability to tolerate the full length of the exam. Generalized age-related cerebral atrophy. Patchy and confluent T2/FLAIR hyperintensity within the periventricular deep white matter both cerebral hemispheres most consistent with chronic small vessel ischemic disease, moderate to severe nature. Multiple remote lacunar infarcts present about the bilateral basal ganglia, thalami, and pons. Few remote lacunar infarcts noted involving the body of the corpus callosum and hemispheric cerebral white matter as well. Patchy multifocal ischemic infarcts seen involving the cortical and subcortical aspect of the right parieto-occipital region (series 5, image 84). Additional 7 mm acute ischemic infarcts seen involving the right insula/subinsular white matter (series 5, image 77). No associated hemorrhage or mass effect. No other diffusion abnormality to suggest  acute or subacute ischemia. Gray-white matter differentiation otherwise maintained. No other areas of encephalomalacia to suggest chronic cortical infarction. No evidence for acute intracranial hemorrhage. No mass lesion, midline shift or mass effect. No hydrocephalus or extra-axial fluid collection. Pituitary gland suprasellar region within normal limits. Midline structures intact. Vascular: Major intracranial vascular flow voids are maintained. Skull and upper cervical spine: Craniocervical junction within normal limits. Bone marrow signal intensity normal. No scalp soft tissue abnormality. Sinuses/Orbits: Patient status post bilateral ocular lens replacement. Moderate mucosal thickening noted within the right greater than left maxillary sinuses, chronic in appearance. Left sphenoid sinus retention cyst noted as well. Additional scattered mucosal thickening noted within the ethmoidal air cells. Trace right mastoid effusion noted, of doubtful significance. Other: None. MRA HEAD FINDINGS ANTERIOR CIRCULATION: Examination degraded by motion artifact. Visualized distal cervical segments of the internal carotid arteries are patent with antegrade flow. Petrous, cavernous, and supraclinoid ICAs patent without appreciable flow-limiting stenosis or other abnormality. There is an apparent 3 mm focal outpouching extending superiorly from the right ICA terminus at the origin of the right A1, which could reflect focal vessel tortuosity or possibly small aneurysm, difficult to be certain on this motion degraded exam (series 5, image 111). A1  otherwise segments patent bilaterally. Grossly normal anterior communicating artery complex. Suspected multifocal moderate left A2 and A3 stenoses (series 1057, image 17). Right ACA patent to its distal aspect without appreciable stenosis. Right M1 widely patent proximally. There is a focal severe distal right M1 stenosis just prior to the bifurcation (series 1057, image 9). Right MCA  branches perfused distally, although demonstrate diffuse small vessel atheromatous irregularity. Left M1 patent without stenosis. Left MCA branches well perfused, although demonstrate diffuse small vessel atheromatous irregularity. POSTERIOR CIRCULATION: Left vertebral artery dominant and patent to the vertebrobasilar junction without stenosis. Right vertebral artery somewhat diminutive and irregular but is grossly patent as well. Neither PICA well visualized. Basilar patent to its distal aspect without stenosis. Superior cerebral arteries patent bilaterally. Both PCAs primarily supplied via the basilar. Moderate to severe stenoses noting the mid left P2 segment (series 1045, image 6). Probable short-segment moderate distal right P2 stenosis (series 1045, image 17). Both PCAs are perfused distally, although demonstrate distal small vessel atheromatous irregularity. MRA NECK FINDINGS Examination severely limited by extensive motion artifact, lack of IV contrast, and patient's inability to tolerate the full length of the exam. Visualized aortic arch grossly within normal limits for caliber. Normal branching pattern seen about the arch. No obvious flow-limiting stenosis about the origin the great vessels, although evaluation fairly limited. Right CCA grossly patent to the bifurcation without stenosis. Atheromatous irregularity with suspected at least mild approximate 30% stenosis at the origin of the right ICA (series 7, image 9). Right ICA otherwise patent distally to the skull base without stenosis or occlusion. Partially visualized left CCA patent to the bifurcation. No obvious flow-limiting stenosis about the left bifurcation (series 7, image 19). Left ICA patent distally to the skull base without stenosis or occlusion. Neither origin of either vertebral artery is well visualized. The dominant left vertebral artery appears grossly patent within the neck (series 7, image 17). The diminutive right vertebral artery is  not well visualized, and could be possibly occluded, although it appears to be grossly patent at the skull base on corresponding MRA of the head. Evaluation for possible vertebral stenosis or other pathology limited on this exam. IMPRESSION: MRI HEAD IMPRESSION: 1. Technically limited exam due to motion artifact and patient's inability to tolerate the full length of the exam. 2. Patchy multifocal small volume acute ischemic infarcts involving the cortical and subcortical right parieto-occipital region and right insula. No associated hemorrhage or mass effect. 3. Underlying age-related cerebral atrophy with moderate to severe chronic microvascular ischemic disease, with multiple remote lacunar infarcts involving the bilateral basal ganglia, thalami, and pons. MRA HEAD IMPRESSION: 1. Technically limited exam due to extensive motion artifact. 2. No large vessel occlusion. 3. Severe distal right M1 stenosis, with additional moderate to severe atheromatous stenoses involving the left ACA and PCAs as above. Distal small vessel atheromatous irregularity. 4. Question 3 mm focal outpouching extending superiorly from the right ICA terminus at the origin of the right A1, which could reflect focal vessel tortuosity or possibly small aneurysm. Difficult to be certain on this motion degraded exam. Attention at follow-up recommended. MRA NECK IMPRESSION: 1. Severely limited exam due to extensive motion artifact, lack of IV contrast, and patient's inability to tolerate the full length of the exam. 2. Approximate 30% atheromatous stenosis at the right bifurcation/proximal right ICA. 3. Gross patency of the left carotid artery system, with no appreciable hemodynamically significant stenosis. 4. Gross patency of the dominant left vertebral artery. Right vertebral artery not visualized, and may  be occluded, although appears grossly patent at the skull base on corresponding MRA of the head. Electronically Signed   By: Jeannine Boga M.D.   On: 12/05/2019 21:43   MR BRAIN WO CONTRAST  Result Date: 12/05/2019 CLINICAL DATA:  Initial evaluation for acute TIA. Speech abnormality with left arm numbness. EXAM: MRI HEAD WITHOUT CONTRAST MRA HEAD WITHOUT CONTRAST MRA NECK WITHOUT CONTRAST TECHNIQUE: Multiplanar, multiecho pulse sequences of the brain and surrounding structures were obtained without intravenous contrast. Angiographic images of the Circle of Willis were obtained using MRA technique without intravenous contrast. Angiographic images of the neck were obtained using MRA technique without intravenous contrast. Carotid stenosis measurements (when applicable) are obtained utilizing NASCET criteria, using the distal internal carotid diameter as the denominator. COMPARISON:  Prior CT from earlier the same day. FINDINGS: MRI HEAD FINDINGS Brain: Examination technically limited by motion artifact and patient's inability to tolerate the full length of the exam. Generalized age-related cerebral atrophy. Patchy and confluent T2/FLAIR hyperintensity within the periventricular deep white matter both cerebral hemispheres most consistent with chronic small vessel ischemic disease, moderate to severe nature. Multiple remote lacunar infarcts present about the bilateral basal ganglia, thalami, and pons. Few remote lacunar infarcts noted involving the body of the corpus callosum and hemispheric cerebral white matter as well. Patchy multifocal ischemic infarcts seen involving the cortical and subcortical aspect of the right parieto-occipital region (series 5, image 84). Additional 7 mm acute ischemic infarcts seen involving the right insula/subinsular white matter (series 5, image 77). No associated hemorrhage or mass effect. No other diffusion abnormality to suggest acute or subacute ischemia. Gray-white matter differentiation otherwise maintained. No other areas of encephalomalacia to suggest chronic cortical infarction. No evidence for acute  intracranial hemorrhage. No mass lesion, midline shift or mass effect. No hydrocephalus or extra-axial fluid collection. Pituitary gland suprasellar region within normal limits. Midline structures intact. Vascular: Major intracranial vascular flow voids are maintained. Skull and upper cervical spine: Craniocervical junction within normal limits. Bone marrow signal intensity normal. No scalp soft tissue abnormality. Sinuses/Orbits: Patient status post bilateral ocular lens replacement. Moderate mucosal thickening noted within the right greater than left maxillary sinuses, chronic in appearance. Left sphenoid sinus retention cyst noted as well. Additional scattered mucosal thickening noted within the ethmoidal air cells. Trace right mastoid effusion noted, of doubtful significance. Other: None. MRA HEAD FINDINGS ANTERIOR CIRCULATION: Examination degraded by motion artifact. Visualized distal cervical segments of the internal carotid arteries are patent with antegrade flow. Petrous, cavernous, and supraclinoid ICAs patent without appreciable flow-limiting stenosis or other abnormality. There is an apparent 3 mm focal outpouching extending superiorly from the right ICA terminus at the origin of the right A1, which could reflect focal vessel tortuosity or possibly small aneurysm, difficult to be certain on this motion degraded exam (series 5, image 111). A1 otherwise segments patent bilaterally. Grossly normal anterior communicating artery complex. Suspected multifocal moderate left A2 and A3 stenoses (series 1057, image 17). Right ACA patent to its distal aspect without appreciable stenosis. Right M1 widely patent proximally. There is a focal severe distal right M1 stenosis just prior to the bifurcation (series 1057, image 9). Right MCA branches perfused distally, although demonstrate diffuse small vessel atheromatous irregularity. Left M1 patent without stenosis. Left MCA branches well perfused, although demonstrate  diffuse small vessel atheromatous irregularity. POSTERIOR CIRCULATION: Left vertebral artery dominant and patent to the vertebrobasilar junction without stenosis. Right vertebral artery somewhat diminutive and irregular but is grossly patent as well. Neither PICA well  visualized. Basilar patent to its distal aspect without stenosis. Superior cerebral arteries patent bilaterally. Both PCAs primarily supplied via the basilar. Moderate to severe stenoses noting the mid left P2 segment (series 1045, image 6). Probable short-segment moderate distal right P2 stenosis (series 1045, image 17). Both PCAs are perfused distally, although demonstrate distal small vessel atheromatous irregularity. MRA NECK FINDINGS Examination severely limited by extensive motion artifact, lack of IV contrast, and patient's inability to tolerate the full length of the exam. Visualized aortic arch grossly within normal limits for caliber. Normal branching pattern seen about the arch. No obvious flow-limiting stenosis about the origin the great vessels, although evaluation fairly limited. Right CCA grossly patent to the bifurcation without stenosis. Atheromatous irregularity with suspected at least mild approximate 30% stenosis at the origin of the right ICA (series 7, image 9). Right ICA otherwise patent distally to the skull base without stenosis or occlusion. Partially visualized left CCA patent to the bifurcation. No obvious flow-limiting stenosis about the left bifurcation (series 7, image 19). Left ICA patent distally to the skull base without stenosis or occlusion. Neither origin of either vertebral artery is well visualized. The dominant left vertebral artery appears grossly patent within the neck (series 7, image 17). The diminutive right vertebral artery is not well visualized, and could be possibly occluded, although it appears to be grossly patent at the skull base on corresponding MRA of the head. Evaluation for possible vertebral  stenosis or other pathology limited on this exam. IMPRESSION: MRI HEAD IMPRESSION: 1. Technically limited exam due to motion artifact and patient's inability to tolerate the full length of the exam. 2. Patchy multifocal small volume acute ischemic infarcts involving the cortical and subcortical right parieto-occipital region and right insula. No associated hemorrhage or mass effect. 3. Underlying age-related cerebral atrophy with moderate to severe chronic microvascular ischemic disease, with multiple remote lacunar infarcts involving the bilateral basal ganglia, thalami, and pons. MRA HEAD IMPRESSION: 1. Technically limited exam due to extensive motion artifact. 2. No large vessel occlusion. 3. Severe distal right M1 stenosis, with additional moderate to severe atheromatous stenoses involving the left ACA and PCAs as above. Distal small vessel atheromatous irregularity. 4. Question 3 mm focal outpouching extending superiorly from the right ICA terminus at the origin of the right A1, which could reflect focal vessel tortuosity or possibly small aneurysm. Difficult to be certain on this motion degraded exam. Attention at follow-up recommended. MRA NECK IMPRESSION: 1. Severely limited exam due to extensive motion artifact, lack of IV contrast, and patient's inability to tolerate the full length of the exam. 2. Approximate 30% atheromatous stenosis at the right bifurcation/proximal right ICA. 3. Gross patency of the left carotid artery system, with no appreciable hemodynamically significant stenosis. 4. Gross patency of the dominant left vertebral artery. Right vertebral artery not visualized, and may be occluded, although appears grossly patent at the skull base on corresponding MRA of the head. Electronically Signed   By: Jeannine Boga M.D.   On: 12/05/2019 21:43   ECHOCARDIOGRAM COMPLETE  Result Date: 12/06/2019    ECHOCARDIOGRAM REPORT   Patient Name:   Briana Garrison Date of Exam: 12/06/2019 Medical Rec  #:  [id number]       Height:       61.0 in Accession #:    [id number]      Weight:       186.0 lb Date of Birth:  Sep 01, 1950       BSA:  1.831 m Patient Age:    75 years        BP:           143/61 mmHg Patient Gender: F               HR:           69 bpm. Exam Location:  Inpatient Procedure: 2D Echo Indications:    Stroke I163.9  History:        Patient has prior history of Echocardiogram examinations, most                 recent 06/22/2019. Risk Factors:Hypertension and Diabetes.  Sonographer:    Mikki Santee RDCS (AE) Referring Phys: [place]  1. Left ventricular ejection fraction, by estimation, is 60 to 65%. The left ventricle has normal function. The left ventricle has no regional wall motion abnormalities. There is moderate concentric left ventricular hypertrophy. Left ventricular diastolic parameters are consistent with Grade I diastolic dysfunction (impaired relaxation). Elevated left atrial pressure.  2. Right ventricular systolic function is normal. The right ventricular size is normal. Tricuspid regurgitation signal is inadequate for assessing PA pressure.  3. The mitral valve is normal in structure. No evidence of mitral valve regurgitation. No evidence of mitral stenosis.  4. The aortic valve is normal in structure. Aortic valve regurgitation is not visualized. No aortic stenosis is present.  5. The inferior vena cava is normal in size with greater than 50% respiratory variability, suggesting right atrial pressure of 3 mmHg. Comparison(s): No significant change from prior study. Prior images reviewed side by side. FINDINGS  Left Ventricle: Left ventricular ejection fraction, by estimation, is 60 to 65%. The left ventricle has normal function. The left ventricle has no regional wall motion abnormalities. The left ventricular internal cavity size was normal in size. There is  moderate concentric left ventricular hypertrophy. Left ventricular diastolic parameters are  consistent with Grade I diastolic dysfunction (impaired relaxation). Elevated left atrial pressure. Right Ventricle: The right ventricular size is normal. No increase in right ventricular wall thickness. Right ventricular systolic function is normal. Tricuspid regurgitation signal is inadequate for assessing PA pressure. Left Atrium: Left atrial size was normal in size. Right Atrium: Right atrial size was normal in size. Pericardium: There is no evidence of pericardial effusion. Mitral Valve: The mitral valve is normal in structure. No evidence of mitral valve regurgitation. No evidence of mitral valve stenosis. Tricuspid Valve: The tricuspid valve is normal in structure. Tricuspid valve regurgitation is not demonstrated. No evidence of tricuspid stenosis. Aortic Valve: The aortic valve is normal in structure. Aortic valve regurgitation is not visualized. No aortic stenosis is present. Pulmonic Valve: The pulmonic valve was normal in structure. Pulmonic valve regurgitation is not visualized. No evidence of pulmonic stenosis. Aorta: The aortic root is normal in size and structure. Venous: The inferior vena cava is normal in size with greater than 50% respiratory variability, suggesting right atrial pressure of 3 mmHg. IAS/Shunts: No atrial level shunt detected by color flow Doppler.  LEFT VENTRICLE PLAX 2D LVIDd:         3.80 cm  Diastology LVIDs:         2.40 cm  LV e' medial:    3.75 cm/s LV PW:         1.60 cm  LV E/e' medial:  24.7 LV IVS:        1.50 cm  LV e' lateral:   3.68 cm/s LVOT diam:  2.10 cm  LV E/e' lateral: 25.2 LV SV:         66 LV SV Index:   36 LVOT Area:     3.46 cm  RIGHT VENTRICLE RV S prime:     10.30 cm/s TAPSE (M-mode): 1.7 cm LEFT ATRIUM             Index       RIGHT ATRIUM          Index LA diam:        3.50 cm 1.91 cm/m  RA Area:     8.51 cm LA Vol (A2C):   40.9 ml 22.33 ml/m RA Volume:   15.70 ml 8.57 ml/m LA Vol (A4C):   41.9 ml 22.88 ml/m LA Biplane Vol: 42.6 ml 23.26 ml/m   AORTIC VALVE LVOT Vmax:   65.90 cm/s LVOT Vmean:  52.400 cm/s LVOT VTI:    0.191 m  AORTA Ao Root diam: 3.00 cm MITRAL VALVE MV Area (PHT): 3.17 cm     SHUNTS MV Decel Time: 239 msec     Systemic VTI:  0.19 m MV E velocity: 92.70 cm/s   Systemic Diam: 2.10 cm MV A velocity: 142.00 cm/s MV E/A ratio:  0.65 Mihai Croitoru MD Electronically signed by Sanda Klein MD Signature Date/Time: 12/06/2019/1:02:09 PM    Final    CT HEAD CODE STROKE WO CONTRAST  Result Date: 12/05/2019 CLINICAL DATA:  Code stroke. 69 year old female with abnormal speech and left arm numbness. EXAM: CT HEAD WITHOUT CONTRAST TECHNIQUE: Contiguous axial images were obtained from the base of the skull through the vertex without intravenous contrast. COMPARISON:  Brain MRI 06/21/2019. FINDINGS: Brain: Numerous chronic lacunar infarcts in the bilateral deep gray matter nuclei. Chronic lacunar infarcts in the brainstem. Associated Patchy and confluent bilateral cerebral white matter hypodensity. No midline shift, ventriculomegaly, mass effect, evidence of mass lesion, intracranial hemorrhage or evidence of cortically based acute infarction. No definite cortical encephalomalacia. Vascular: Calcified atherosclerosis at the skull base. No suspicious intracranial vascular hyperdensity. Skull: No acute osseous abnormality identified. Sinuses/Orbits: Chronic right maxillary sinusitis. Stable left sphenoid sinus retention cysts. Tympanic cavities and mastoids remain well aerated. Other: No acute orbit or scalp soft tissue finding. ASPECTS [facility] Stroke Program Early CT Score) Total score (0-10 with 10 being normal): 10 IMPRESSION: 1. Severe chronic small-vessel ischemia. No acute cortically based infarct or acute intracranial hemorrhage identified. ASPECTS 10. 2. These results were communicated to Dr. Bartholome Bill at 7:48 pm on 12/05/2019 by text page via the [facility] messaging system. 3. Chronic right maxillary sinusitis. Electronically Signed   By: Genevie Ann M.D.   On: 12/05/2019 19:49   VAS US CAROTID (at [facility] and [facility] only)  Result Date: 12/06/2019 Carotid Arterial Duplex Study Indications:       TIA. Risk Factors:      Hypertension, Diabetes. Comparison Study:  06/21/2019 - 1-39% ICA stenosis bilaterally. Performing Technologist: Oliver Hum RVT  Examination Guidelines: A complete evaluation includes B-mode imaging, spectral Doppler, color Doppler, and power Doppler as needed of all accessible portions of each vessel. Bilateral testing is considered an integral part of a complete examination. Limited examinations for reoccurring indications may be performed as noted.  Right Carotid Findings: +----------+--------+--------+--------+-----------------------+--------+           PSV cm/sEDV cm/sStenosisPlaque Description     Comments +----------+--------+--------+--------+-----------------------+--------+ CCA Prox  82      15              smooth and  heterogenoustortuous +----------+--------+--------+--------+-----------------------+--------+ CCA Distal52      13              smooth and heterogenous         +----------+--------+--------+--------+-----------------------+--------+ ICA Prox  62      17              smooth and heterogenous         +----------+--------+--------+--------+-----------------------+--------+ ICA Distal66      24                                     tortuous +----------+--------+--------+--------+-----------------------+--------+ ECA       57      6                                               +----------+--------+--------+--------+-----------------------+--------+ +----------+--------+-------+--------+-------------------+           PSV cm/sEDV cmsDescribeArm Pressure (mmHG) +----------+--------+-------+--------+-------------------+ Subclavian185                                        +----------+--------+-------+--------+-------------------+ +---------+--------+--+--------+-+---------+  VertebralPSV cm/s13EDV cm/s5Antegrade +---------+--------+--+--------+-+---------+  Left Carotid Findings: +----------+--------+--------+--------+-----------------------+--------+           PSV cm/sEDV cm/sStenosisPlaque Description     Comments +----------+--------+--------+--------+-----------------------+--------+ CCA Prox  87      14              smooth and heterogenoustortuous +----------+--------+--------+--------+-----------------------+--------+ CCA Distal62      18              smooth and heterogenous         +----------+--------+--------+--------+-----------------------+--------+ ICA Prox  47      16              smooth and heterogenous         +----------+--------+--------+--------+-----------------------+--------+ ICA Distal59      23                                     tortuous +----------+--------+--------+--------+-----------------------+--------+ ECA       55      6                                               +----------+--------+--------+--------+-----------------------+--------+ +----------+--------+--------+--------+-------------------+           PSV cm/sEDV cm/sDescribeArm Pressure (mmHG) +----------+--------+--------+--------+-------------------+ [id number]                                         +----------+--------+--------+--------+-------------------+ +---------+--------+--+--------+--+---------+ VertebralPSV cm/s60EDV cm/s12Antegrade +---------+--------+--+--------+--+---------+   Summary: Right Carotid: Velocities in the right ICA are consistent with a 1-39% stenosis. Left Carotid: Velocities in the left ICA are consistent with a 1-39% stenosis. Vertebrals: Bilateral vertebral arteries demonstrate antegrade flow. *See table(s) above for measurements and observations.  Electronically signed by Antony Contras MD on 12/06/2019 at 1:17:52 PM.    Final    Scheduled Meds: .  stroke: mapping  our early stages of recovery book    Does not apply Once  . aspirin EC  81 mg Oral Daily  . carvedilol  12.5 mg Oral BID WC  . cloNIDine  0.2 mg Oral BID  . clopidogrel  75 mg Oral Daily  . enoxaparin (LOVENOX) injection  30 mg Subcutaneous QHS  . insulin aspart  0-9 Units Subcutaneous TID WC  . insulin glargine  18 Units Subcutaneous QHS  . oxyCODONE  2.5 mg Oral Once  . rosuvastatin  40 mg Oral Daily  . sodium chloride flush  3 mL Intravenous Once  . vitamin B-12  1,000 mcg Oral Daily   Continuous Infusions:   LOS: 0 days    Time spent: 25 mins    Shawna Clamp, MD [facility]   If 7PM-7AM, please contact night-coverage

## 2019-12-06 NOTE — ED Notes (Signed)
OT and PT at bedside

## 2019-12-06 NOTE — ED Notes (Signed)
MD Renetta Chalk notified of 568 CC urine retention. Nurse told to I&O and rescan in 4 hour.

## 2019-12-06 NOTE — ED Notes (Signed)
Lunch Tray Ordered @ 1033. 

## 2019-12-06 NOTE — ED Notes (Signed)
MD at bedside. 

## 2019-12-06 NOTE — ED Notes (Signed)
OTF in CT

## 2019-12-06 NOTE — Progress Notes (Signed)
Carotid artery duplex has been completed. Preliminary results can be found in CV Proc through chart review.   12/06/19 9:57 AM Briana Garrison RVT

## 2019-12-06 NOTE — Evaluation (Addendum)
Occupational Therapy Evaluation Patient Details Name: Briana Garrison MRN: [id number] DOB: Apr 15, 1950 Today's Date: 12/06/2019    History of Present Illness Pt is a 69 y/o female with PMHx of previous stroke in May 2021 with residual L side weakness/sesnation changes, hx of DM, HTN, kidney CA. Pt now presenting with slurred speech and L facial droop noticed by pt's daughter who brought her to the ER. MRI revealed acute CVA involving the right parieto-occipital and right insular region.    Clinical Impression   This 69 y/o female presents with the above, seen in ED today for OT eval. PTA pt reports being mod independent with ADL and functional mobility using quad cane. Pt currently presenting with the above and below listed deficits including R shoulder pain (reports rotator cuff tear), decreased sensation bil (L side > R) and gross L side weakness. Pt performing mobility tasks using quad cane with minguard-minA throughout; overall performing ADL tasks at minA level. Pt reports living with daughter who is able to provide supervision/assist PRN after discharge. She will benefit from continued acute OT services and currently recommend outpt OT services to further address R shoulder deficits at time of discharge. Will follow.   Follow Up Recommendations  Outpatient OT;Supervision - Intermittent (for R shoulder )    Equipment Recommendations  None recommended by OT           Precautions / Restrictions Precautions Precautions: Fall Restrictions Weight Bearing Restrictions: No      Mobility Bed Mobility Overal bed mobility: Needs Assistance Bed Mobility: Supine to Sit;Sit to Supine     Supine to sit: Min assist Sit to supine: Mod assist   General bed mobility comments: assist for trunk elevation given RUE pain; assist for LEs onto elevated height of stretcher     Transfers Overall transfer level: Needs assistance Equipment used: 1 person hand held assist;Quad cane Transfers: Sit  to/from Stand Sit to Stand: Min assist;+2 safety/equipment         General transfer comment: assist to steady upon rise to standing, +2 for safety given height of ED stretcher    Balance Overall balance assessment: Needs assistance Sitting-balance support: Feet unsupported Sitting balance-Leahy Scale: Good     Standing balance support: Single extremity supported Standing balance-Leahy Scale: Fair Standing balance comment: able to maintain static standing with minguard assist                            ADL either performed or assessed with clinical judgement   ADL Overall ADL's : Needs assistance/impaired Eating/Feeding: Modified independent;Sitting   Grooming: Min guard;Standing   Upper Body Bathing: Min guard;Sitting   Lower Body Bathing: Minimal assistance;Sit to/from stand   Upper Body Dressing : Min guard;Sitting   Lower Body Dressing: Minimal assistance;+2 for safety/equipment;Sit to/from stand   Toilet Transfer: Minimal assistance;Ambulation Toilet Transfer Details (indicate cue type and reason): via quad cane Toileting- Clothing Manipulation and Hygiene: Minimal assistance;Sit to/from Nurse, children's Details (indicate cue type and reason): discussed having dtr provide supervision for tub transfers initially; educated to use shower seat initially for added safety, pt in agreement  Functional mobility during ADLs: Minimal assistance;+2 for safety/equipment;Cane (quad cane ) General ADL Comments: pt reports increased "tenderness" in bil LEs, reduced gait speed compared to baseline      Vision Baseline Vision/History: Wears glasses Wears Glasses: Reading only Patient Visual Report: No change from baseline Vision Assessment?: No apparent visual  deficits;Yes Eye Alignment: Within Functional Limits Ocular Range of Motion: Within Functional Limits Alignment/Gaze Preference: Within Defined Limits Tracking/Visual Pursuits: Able to track  stimulus in all quads without difficulty;Unable to hold eye position out of midline;Decreased smoothness of horizontal tracking Visual Fields: No apparent deficits     Perception     Praxis      Pertinent Vitals/Pain Pain Assessment: 0-10 Pain Score: 7  Pain Location: R shoulder Pain Descriptors / Indicators: Discomfort Pain Intervention(s): Limited activity within patient's tolerance;Monitored during session;Repositioned     Hand Dominance Right   Extremity/Trunk Assessment Upper Extremity Assessment Upper Extremity Assessment: RUE deficits/detail;LUE deficits/detail RUE Deficits / Details: limited shoulder ROM (grossly 0-90*) given baseline rorator cuff tear; pt endorses decreased grip strength in bil hands and has found that she drops items on occasions RUE Sensation: decreased light touch (mild sensation changes in R hand ) RUE Coordination: decreased gross motor LUE Deficits / Details: gross weakness throughout (shoulder 4-/5, elbow 4/5).  pt endorses decreased grip strength in bil hands and has found that she drops items on occasions. fair grip strength and fine motor appears WFL LUE Sensation: decreased light touch LUE Coordination:  (gross weakness )   Lower Extremity Assessment Lower Extremity Assessment: Defer to PT evaluation       Communication Communication Communication: No difficulties   Cognition Arousal/Alertness: Awake/alert Behavior During Therapy: WFL for tasks assessed/performed Overall Cognitive Status: Within Functional Limits for tasks assessed                                     General Comments  VSS, daughter present during session     Exercises     Shoulder Instructions      Home Living Family/patient expects to be discharged to:: Private residence Living Arrangements: Spouse/significant other;Children Available Help at Discharge: Family;Available 24 hours/day Type of Home: House Home Access: Stairs to enter [employer] of Steps: 2 Entrance Stairs-Rails: None Home Layout: One level     Bathroom Shower/Tub: Teacher, early years/pre: Handicapped height     Home Equipment: Cane - quad;Shower seat   Additional Comments: lives with daughter who has MS, dtr does not work       Prior Functioning/Environment Level of Independence: Independent with assistive device(s)  Gait / Transfers Assistance Needed: ambulates with quad cane; can ambulate community; ADL's / Homemaking Assistance Needed: Does shower without chair; normally independent with ADLs; drives, assists with iADL such as grocery shopping   Comments: Report R rotator cuff tear and is waiting on surgery for about a year - manages with gabapentin at home.  Reports was going to start outpt PT for general strengthening/prior CVA recovery        OT Problem List: Decreased strength;Decreased range of motion;Decreased activity tolerance;Impaired balance (sitting and/or standing);Decreased coordination;Pain;Impaired UE functional use      OT Treatment/Interventions: Self-care/ADL training;Therapeutic exercise;Energy conservation;DME and/or AE instruction;Therapeutic activities;Cognitive remediation/compensation;Patient/family education;Balance training    OT Goals(Current goals can be found in the care plan section) Acute Rehab OT Goals Patient Stated Goal: home, start outpt therapies OT Goal Formulation: With patient Time For Goal Achievement: 12/20/19 Potential to Achieve Goals: Good  OT Frequency: Min 2X/week   Barriers to D/C:            Co-evaluation PT/OT/SLP Co-Evaluation/Treatment: Yes Reason for Co-Treatment: For patient/therapist safety;To address functional/ADL transfers (seen in ED)   OT goals addressed during  session: ADL's and self-care;Strengthening/ROM      AM-PAC OT "6 Clicks" Daily Activity     Outcome Measure Help from another person eating meals?: None Help from another person taking care of  personal grooming?: A Little Help from another person toileting, which includes using toliet, bedpan, or urinal?: A Little Help from another person bathing (including washing, rinsing, drying)?: A Little Help from another person to put on and taking off regular upper body clothing?: A Little Help from another person to put on and taking off regular lower body clothing?: A Little 6 Click Score: 19   End of Session Equipment Utilized During Treatment: Gait belt Nurse Communication: Mobility status  Activity Tolerance: Patient tolerated treatment well Patient left: in bed;with call bell/phone within reach;with family/visitor present  OT Visit Diagnosis: Muscle weakness (generalized) (M62.81);Other symptoms and signs involving the nervous system (R29.898);Pain Pain - Right/Left: Right Pain - part of body: Shoulder                Time: 4970-2637 OT Time Calculation (min): 34 min Charges:  OT General Charges $OT Visit: 1 Visit OT Evaluation $OT Eval Moderate Complexity: Clarke, OT Acute Rehabilitation Services Pager [phone] Office [phone]   Raymondo Band 12/06/2019, 11:03 AM

## 2019-12-06 NOTE — ED Notes (Signed)
Vascular at bedside

## 2019-12-06 NOTE — Evaluation (Signed)
Physical Therapy Evaluation Patient Details Name: Briana Garrison MRN: [id number] DOB: 03-Dec-1950 Today's Date: 12/06/2019   History of Present Illness  Pt is a 69 y/o female with PMHx of previous stroke in May 2021 with residual L side weakness/sesnation changes, hx of DM, HTN, kidney CA. Pt now presenting with slurred speech and L facial droop noticed by pt's daughter who brought her to the ER. MRI revealed acute CVA involving the right parieto-occipital and right insular region.   Clinical Impression   Pt admitted with above diagnosis.  Pt with recent CVA with residual L sided weakness/sensation changes that she reports are mildly increased at this time.  She does report new R leg decreased sensation.  She was able to transfer with min A and ambulated with min guard level today using her quad cane.  She ambulated at decreased speeds and demonstrated decreased dynamic gait task and balance.  Pt has necessary DME and support at home.  She was planning on attending outpt PT (was not happy with HHPT) to further her strength and balance post last CVA - recommend pt to continue this. Pt currently with functional limitations due to the deficits listed below (see PT Problem List). Pt will benefit from skilled PT to increase their independence and safety with mobility to allow discharge to the venue listed below.       Follow Up Recommendations Outpatient PT;Supervision - Intermittent    Equipment Recommendations  None recommended by PT    Recommendations for Other Services       Precautions / Restrictions Precautions Precautions: Fall Restrictions Weight Bearing Restrictions: No      Mobility  Bed Mobility Overal bed mobility: Needs Assistance Bed Mobility: Supine to Sit;Sit to Supine     Supine to sit: Min assist Sit to supine: Mod assist   General bed mobility comments: assist for trunk elevation given RUE pain; assist for LEs onto elevated height of stretcher      Transfers Overall transfer level: Needs assistance Equipment used: 1 person hand held assist;Quad cane Transfers: Sit to/from Stand Sit to Stand: Min assist;+2 safety/equipment         General transfer comment: assist to steady upon rise to standing, +2 for safety given height of ED stretcher  Ambulation/Gait Ambulation/Gait assistance: Min guard Gait Distance (Feet): 200 Feet Assistive device: Quad cane Gait Pattern/deviations: Step-to pattern;Decreased stride length Gait velocity: decreased   General Gait Details: Pt demonstrated slow but steady gait; reports some mild fatigue  Stairs            Wheelchair Mobility    Modified Rankin (Stroke Patients Only)       Balance Overall balance assessment: Needs assistance Sitting-balance support: Feet unsupported Sitting balance-Leahy Scale: Good     Standing balance support: Single extremity supported Standing balance-Leahy Scale: Fair Standing balance comment: Able to maintain static standing; used quad cane for ambulation               High Level Balance Comments: Pt was able to look up/down/L/R ,turn, stop, and navigate around obstacles without LOB.  She did have slow gait speed with little change when asked to change speeds and unable to step over object in floor.             Pertinent Vitals/Pain Pain Assessment: 0-10 Pain Score: 7  Pain Location: R shoulder Pain Descriptors / Indicators: Discomfort Pain Intervention(s): Limited activity within patient's tolerance;Monitored during session;Repositioned    Home Living Family/patient expects to be discharged  to:: Private residence Living Arrangements: Spouse/significant other;Children Available Help at Discharge: Family;Available 24 hours/day Type of Home: House Home Access: Stairs to enter Entrance Stairs-Rails: None Entrance Stairs-Number of Steps: 2 Home Layout: One level Home Equipment: Cane - quad;Shower seat Additional Comments: lives  with daughter and spouse who has MS, dtr does not work    Prior Function Level of Independence: Independent with assistive device(s)   Gait / Transfers Assistance Needed: ambulates with quad cane; can ambulate community  ADL's / Homemaking Assistance Needed: Does shower without chair; normally independent with ADLs; drives, assists with iADL such as grocery shopping  Comments: Report R rotator cuff tear and is waiting on surgery for about a year - manages with gabapentin at home.  Reports was going to start outpt PT for general strengthening/prior CVA recovery     Hand Dominance   Dominant Hand: Right    Extremity/Trunk Assessment   Upper Extremity Assessment Upper Extremity Assessment: Defer to OT evaluation RUE Deficits / Details: limited shoulder ROM (grossly 0-90*) given baseline rorator cuff tear; pt endorses decreased grip strength in bil hands and has found that she drops items on occasions RUE Sensation: decreased light touch (mild sensation changes in R hand ) RUE Coordination: decreased gross motor LUE Deficits / Details: gross weakness throughout (shoulder 4-/5, elbow 4/5).  pt endorses decreased grip strength in bil hands and has found that she drops items on occasions. fair grip strength and fine motor appears WFL LUE Sensation: decreased light touch LUE Coordination:  (gross weakness )    Lower Extremity Assessment Lower Extremity Assessment: LLE deficits/detail;RLE deficits/detail RLE Deficits / Details: ROM WFL; MMT 5/5; Reports new decreased sensation throughout lower leg LLE Deficits / Details: ROM WFL; MMT 5/5; Had some decreased sensation in leg but reports "may" be worse    Cervical / Trunk Assessment Cervical / Trunk Assessment: Normal  Communication   Communication: No difficulties  Cognition Arousal/Alertness: Awake/alert Behavior During Therapy: WFL for tasks assessed/performed Overall Cognitive Status: Within Functional Limits for tasks assessed                                         General Comments General comments (skin integrity, edema, etc.): VSS; daughter present.  Educated on PT role and plan of care.  Discussed recommendation to continue with her outpt PT appt.    Exercises     Assessment/Plan    PT Assessment Patient needs continued PT services  PT Problem List Decreased strength;Decreased mobility;Decreased coordination;Decreased activity tolerance;Decreased balance;Decreased knowledge of use of DME       PT Treatment Interventions DME instruction;Therapeutic exercise;Gait training;Balance training;Stair training;Neuromuscular re-education;Functional mobility training;Therapeutic activities;Patient/family education    PT Goals (Current goals can be found in the Care Plan section)  Acute Rehab PT Goals Patient Stated Goal: home, start outpt therapies PT Goal Formulation: With patient/family Time For Goal Achievement: 12/20/19 Potential to Achieve Goals: Good    Frequency Min 4X/week   Barriers to discharge        Co-evaluation PT/OT/SLP Co-Evaluation/Treatment: Yes Reason for Co-Treatment: For patient/therapist safety (see in ED) PT goals addressed during session: Mobility/safety with mobility;Balance OT goals addressed during session: ADL's and self-care;Strengthening/ROM       AM-PAC PT "6 Clicks" Mobility  Outcome Measure Help needed turning from your back to your side while in a flat bed without using bedrails?: A Little Help needed moving from lying  on your back to sitting on the side of a flat bed without using bedrails?: A Little Help needed moving to and from a bed to a chair (including a wheelchair)?: A Little Help needed standing up from a chair using your arms (e.g., wheelchair or bedside chair)?: A Little Help needed to walk in hospital room?: A Little Help needed climbing 3-5 steps with a railing? : A Little 6 Click Score: 18    End of Session Equipment Utilized During  Treatment: Gait belt Activity Tolerance: Patient tolerated treatment well Patient left: in bed;with call bell/phone within reach;with family/visitor present Nurse Communication: Mobility status PT Visit Diagnosis: Other abnormalities of gait and mobility (R26.89)    Time: 3149-7026 PT Time Calculation (min) (ACUTE ONLY): 34 min   Charges:   PT Evaluation $PT Eval Low Complexity: 1 Low          Kameren Pargas, PT Acute Rehab Services Pager [phone] [facility] 12/06/2019, 2:29 PM

## 2019-12-06 NOTE — ED Notes (Signed)
Vital signs stable. 

## 2019-12-06 NOTE — Progress Notes (Signed)
STROKE TEAM PROGRESS NOTE   INTERVAL HISTORY Her daughter is at the bedside. I have personally reviewed history of presenting illness, electronic medical records and available imaging films in PACS. She presented with sudden onset of slurred speech and left-sided weakness upon arising from sleep yesterday. She has previous history of right thalamic stroke in June and was enrolled in the sleep smart stroke prevention trial for sleep apnea and was randomized to CPAP. MRI scan of the brain shows patchy right MCA infarcts and MRA shows right M1 stenosis. MRA of the neck shows no significant extracranial stenosis with motion degraded. Hemoglobin A1c is elevated 8.4 and LDL cholesterol is 49 mg percent.  Vitals:   12/06/19 0900 12/06/19 0915 12/06/19 0930 12/06/19 0945  BP: (!) 142/116 (!) 152/77 140/77 138/73  Pulse: 72 73 66 70  Resp: (!) 29 15 17 15   Temp:      TempSrc:      SpO2: 91% 95% 93% 95%  Weight:      Height:       CBC:  Recent Labs  Lab 12/05/19 1937 12/05/19 1937 12/05/19 1953 12/06/19 0022  WBC 6.6  --   --  6.3  NEUTROABS 3.6  --   --   --   HGB 10.9*   < > 10.9* 10.2*  HCT 34.4*   < > 32.0* 32.4*  MCV 90.8  --   --  91.0  PLT 270  --   --  240   < > = values in this interval not displayed.   Basic Metabolic Panel:  Recent Labs  Lab 12/05/19 1937 12/05/19 1937 12/05/19 1953 12/06/19 0022  NA 133*  --  138  --   K 4.8  --  4.7  --   CL 105  --  105  --   CO2 21*  --   --   --   GLUCOSE 204*  --  199*  --   BUN 48*  --  46*  --   CREATININE 2.29*   < > 2.70* 2.12*  CALCIUM 8.9  --   --   --    < > = values in this interval not displayed.   Lipid Panel:  Recent Labs  Lab 12/06/19 0246  CHOL 119  TRIG 150*  HDL 40*  CHOLHDL 3.0  VLDL 30  LDLCALC 49   HgbA1c:  Recent Labs  Lab 12/06/19 0246  HGBA1C 8.4*   Urine Drug Screen: No results for input(s): LABOPIA, COCAINSCRNUR, LABBENZ, AMPHETMU, THCU, LABBARB in the last 168 hours.  Alcohol Level No  results for input(s): ETH in the last 168 hours.  IMAGING past 24 hours MR ANGIO HEAD WO CONTRAST  Result Date: 12/05/2019 CLINICAL DATA:  Initial evaluation for acute TIA. Speech abnormality with left arm numbness. EXAM: MRI HEAD WITHOUT CONTRAST MRA HEAD WITHOUT CONTRAST MRA NECK WITHOUT CONTRAST TECHNIQUE: Multiplanar, multiecho pulse sequences of the brain and surrounding structures were obtained without intravenous contrast. Angiographic images of the Circle of Willis were obtained using MRA technique without intravenous contrast. Angiographic images of the neck were obtained using MRA technique without intravenous contrast. Carotid stenosis measurements (when applicable) are obtained utilizing NASCET criteria, using the distal internal carotid diameter as the denominator. COMPARISON:  Prior CT from earlier the same day. FINDINGS: MRI HEAD FINDINGS Brain: Examination technically limited by motion artifact and patient's inability to tolerate the full length of the exam. Generalized age-related cerebral atrophy. Patchy and confluent T2/FLAIR hyperintensity within the periventricular  deep white matter both cerebral hemispheres most consistent with chronic small vessel ischemic disease, moderate to severe nature. Multiple remote lacunar infarcts present about the bilateral basal ganglia, thalami, and pons. Few remote lacunar infarcts noted involving the body of the corpus callosum and hemispheric cerebral white matter as well. Patchy multifocal ischemic infarcts seen involving the cortical and subcortical aspect of the right parieto-occipital region (series 5, image 84). Additional 7 mm acute ischemic infarcts seen involving the right insula/subinsular white matter (series 5, image 77). No associated hemorrhage or mass effect. No other diffusion abnormality to suggest acute or subacute ischemia. Gray-white matter differentiation otherwise maintained. No other areas of encephalomalacia to suggest chronic  cortical infarction. No evidence for acute intracranial hemorrhage. No mass lesion, midline shift or mass effect. No hydrocephalus or extra-axial fluid collection. Pituitary gland suprasellar region within normal limits. Midline structures intact. Vascular: Major intracranial vascular flow voids are maintained. Skull and upper cervical spine: Craniocervical junction within normal limits. Bone marrow signal intensity normal. No scalp soft tissue abnormality. Sinuses/Orbits: Patient status post bilateral ocular lens replacement. Moderate mucosal thickening noted within the right greater than left maxillary sinuses, chronic in appearance. Left sphenoid sinus retention cyst noted as well. Additional scattered mucosal thickening noted within the ethmoidal air cells. Trace right mastoid effusion noted, of doubtful significance. Other: None. MRA HEAD FINDINGS ANTERIOR CIRCULATION: Examination degraded by motion artifact. Visualized distal cervical segments of the internal carotid arteries are patent with antegrade flow. Petrous, cavernous, and supraclinoid ICAs patent without appreciable flow-limiting stenosis or other abnormality. There is an apparent 3 mm focal outpouching extending superiorly from the right ICA terminus at the origin of the right A1, which could reflect focal vessel tortuosity or possibly small aneurysm, difficult to be certain on this motion degraded exam (series 5, image 111). A1 otherwise segments patent bilaterally. Grossly normal anterior communicating artery complex. Suspected multifocal moderate left A2 and A3 stenoses (series 1057, image 17). Right ACA patent to its distal aspect without appreciable stenosis. Right M1 widely patent proximally. There is a focal severe distal right M1 stenosis just prior to the bifurcation (series 1057, image 9). Right MCA branches perfused distally, although demonstrate diffuse small vessel atheromatous irregularity. Left M1 patent without stenosis. Left MCA  branches well perfused, although demonstrate diffuse small vessel atheromatous irregularity. POSTERIOR CIRCULATION: Left vertebral artery dominant and patent to the vertebrobasilar junction without stenosis. Right vertebral artery somewhat diminutive and irregular but is grossly patent as well. Neither PICA well visualized. Basilar patent to its distal aspect without stenosis. Superior cerebral arteries patent bilaterally. Both PCAs primarily supplied via the basilar. Moderate to severe stenoses noting the mid left P2 segment (series 1045, image 6). Probable short-segment moderate distal right P2 stenosis (series 1045, image 17). Both PCAs are perfused distally, although demonstrate distal small vessel atheromatous irregularity. MRA NECK FINDINGS Examination severely limited by extensive motion artifact, lack of IV contrast, and patient's inability to tolerate the full length of the exam. Visualized aortic arch grossly within normal limits for caliber. Normal branching pattern seen about the arch. No obvious flow-limiting stenosis about the origin the great vessels, although evaluation fairly limited. Right CCA grossly patent to the bifurcation without stenosis. Atheromatous irregularity with suspected at least mild approximate 30% stenosis at the origin of the right ICA (series 7, image 9). Right ICA otherwise patent distally to the skull base without stenosis or occlusion. Partially visualized left CCA patent to the bifurcation. No obvious flow-limiting stenosis about the left bifurcation (series 7,  image 19). Left ICA patent distally to the skull base without stenosis or occlusion. Neither origin of either vertebral artery is well visualized. The dominant left vertebral artery appears grossly patent within the neck (series 7, image 17). The diminutive right vertebral artery is not well visualized, and could be possibly occluded, although it appears to be grossly patent at the skull base on corresponding MRA of  the head. Evaluation for possible vertebral stenosis or other pathology limited on this exam. IMPRESSION: MRI HEAD IMPRESSION: 1. Technically limited exam due to motion artifact and patient's inability to tolerate the full length of the exam. 2. Patchy multifocal small volume acute ischemic infarcts involving the cortical and subcortical right parieto-occipital region and right insula. No associated hemorrhage or mass effect. 3. Underlying age-related cerebral atrophy with moderate to severe chronic microvascular ischemic disease, with multiple remote lacunar infarcts involving the bilateral basal ganglia, thalami, and pons. MRA HEAD IMPRESSION: 1. Technically limited exam due to extensive motion artifact. 2. No large vessel occlusion. 3. Severe distal right M1 stenosis, with additional moderate to severe atheromatous stenoses involving the left ACA and PCAs as above. Distal small vessel atheromatous irregularity. 4. Question 3 mm focal outpouching extending superiorly from the right ICA terminus at the origin of the right A1, which could reflect focal vessel tortuosity or possibly small aneurysm. Difficult to be certain on this motion degraded exam. Attention at follow-up recommended. MRA NECK IMPRESSION: 1. Severely limited exam due to extensive motion artifact, lack of IV contrast, and patient's inability to tolerate the full length of the exam. 2. Approximate 30% atheromatous stenosis at the right bifurcation/proximal right ICA. 3. Gross patency of the left carotid artery system, with no appreciable hemodynamically significant stenosis. 4. Gross patency of the dominant left vertebral artery. Right vertebral artery not visualized, and may be occluded, although appears grossly patent at the skull base on corresponding MRA of the head. Electronically Signed   By: Jeannine Boga M.D.   On: 12/05/2019 21:43   MR MRA NECK WO CONTRAST  Result Date: 12/05/2019 CLINICAL DATA:  Initial evaluation for acute TIA.  Speech abnormality with left arm numbness. EXAM: MRI HEAD WITHOUT CONTRAST MRA HEAD WITHOUT CONTRAST MRA NECK WITHOUT CONTRAST TECHNIQUE: Multiplanar, multiecho pulse sequences of the brain and surrounding structures were obtained without intravenous contrast. Angiographic images of the Circle of Willis were obtained using MRA technique without intravenous contrast. Angiographic images of the neck were obtained using MRA technique without intravenous contrast. Carotid stenosis measurements (when applicable) are obtained utilizing NASCET criteria, using the distal internal carotid diameter as the denominator. COMPARISON:  Prior CT from earlier the same day. FINDINGS: MRI HEAD FINDINGS Brain: Examination technically limited by motion artifact and patient's inability to tolerate the full length of the exam. Generalized age-related cerebral atrophy. Patchy and confluent T2/FLAIR hyperintensity within the periventricular deep white matter both cerebral hemispheres most consistent with chronic small vessel ischemic disease, moderate to severe nature. Multiple remote lacunar infarcts present about the bilateral basal ganglia, thalami, and pons. Few remote lacunar infarcts noted involving the body of the corpus callosum and hemispheric cerebral white matter as well. Patchy multifocal ischemic infarcts seen involving the cortical and subcortical aspect of the right parieto-occipital region (series 5, image 84). Additional 7 mm acute ischemic infarcts seen involving the right insula/subinsular white matter (series 5, image 77). No associated hemorrhage or mass effect. No other diffusion abnormality to suggest acute or subacute ischemia. Gray-white matter differentiation otherwise maintained. No other areas of encephalomalacia to  suggest chronic cortical infarction. No evidence for acute intracranial hemorrhage. No mass lesion, midline shift or mass effect. No hydrocephalus or extra-axial fluid collection. Pituitary gland  suprasellar region within normal limits. Midline structures intact. Vascular: Major intracranial vascular flow voids are maintained. Skull and upper cervical spine: Craniocervical junction within normal limits. Bone marrow signal intensity normal. No scalp soft tissue abnormality. Sinuses/Orbits: Patient status post bilateral ocular lens replacement. Moderate mucosal thickening noted within the right greater than left maxillary sinuses, chronic in appearance. Left sphenoid sinus retention cyst noted as well. Additional scattered mucosal thickening noted within the ethmoidal air cells. Trace right mastoid effusion noted, of doubtful significance. Other: None. MRA HEAD FINDINGS ANTERIOR CIRCULATION: Examination degraded by motion artifact. Visualized distal cervical segments of the internal carotid arteries are patent with antegrade flow. Petrous, cavernous, and supraclinoid ICAs patent without appreciable flow-limiting stenosis or other abnormality. There is an apparent 3 mm focal outpouching extending superiorly from the right ICA terminus at the origin of the right A1, which could reflect focal vessel tortuosity or possibly small aneurysm, difficult to be certain on this motion degraded exam (series 5, image 111). A1 otherwise segments patent bilaterally. Grossly normal anterior communicating artery complex. Suspected multifocal moderate left A2 and A3 stenoses (series 1057, image 17). Right ACA patent to its distal aspect without appreciable stenosis. Right M1 widely patent proximally. There is a focal severe distal right M1 stenosis just prior to the bifurcation (series 1057, image 9). Right MCA branches perfused distally, although demonstrate diffuse small vessel atheromatous irregularity. Left M1 patent without stenosis. Left MCA branches well perfused, although demonstrate diffuse small vessel atheromatous irregularity. POSTERIOR CIRCULATION: Left vertebral artery dominant and patent to the vertebrobasilar  junction without stenosis. Right vertebral artery somewhat diminutive and irregular but is grossly patent as well. Neither PICA well visualized. Basilar patent to its distal aspect without stenosis. Superior cerebral arteries patent bilaterally. Both PCAs primarily supplied via the basilar. Moderate to severe stenoses noting the mid left P2 segment (series 1045, image 6). Probable short-segment moderate distal right P2 stenosis (series 1045, image 17). Both PCAs are perfused distally, although demonstrate distal small vessel atheromatous irregularity. MRA NECK FINDINGS Examination severely limited by extensive motion artifact, lack of IV contrast, and patient's inability to tolerate the full length of the exam. Visualized aortic arch grossly within normal limits for caliber. Normal branching pattern seen about the arch. No obvious flow-limiting stenosis about the origin the great vessels, although evaluation fairly limited. Right CCA grossly patent to the bifurcation without stenosis. Atheromatous irregularity with suspected at least mild approximate 30% stenosis at the origin of the right ICA (series 7, image 9). Right ICA otherwise patent distally to the skull base without stenosis or occlusion. Partially visualized left CCA patent to the bifurcation. No obvious flow-limiting stenosis about the left bifurcation (series 7, image 19). Left ICA patent distally to the skull base without stenosis or occlusion. Neither origin of either vertebral artery is well visualized. The dominant left vertebral artery appears grossly patent within the neck (series 7, image 17). The diminutive right vertebral artery is not well visualized, and could be possibly occluded, although it appears to be grossly patent at the skull base on corresponding MRA of the head. Evaluation for possible vertebral stenosis or other pathology limited on this exam. IMPRESSION: MRI HEAD IMPRESSION: 1. Technically limited exam due to motion artifact and  patient's inability to tolerate the full length of the exam. 2. Patchy multifocal small volume acute ischemic infarcts involving  the cortical and subcortical right parieto-occipital region and right insula. No associated hemorrhage or mass effect. 3. Underlying age-related cerebral atrophy with moderate to severe chronic microvascular ischemic disease, with multiple remote lacunar infarcts involving the bilateral basal ganglia, thalami, and pons. MRA HEAD IMPRESSION: 1. Technically limited exam due to extensive motion artifact. 2. No large vessel occlusion. 3. Severe distal right M1 stenosis, with additional moderate to severe atheromatous stenoses involving the left ACA and PCAs as above. Distal small vessel atheromatous irregularity. 4. Question 3 mm focal outpouching extending superiorly from the right ICA terminus at the origin of the right A1, which could reflect focal vessel tortuosity or possibly small aneurysm. Difficult to be certain on this motion degraded exam. Attention at follow-up recommended. MRA NECK IMPRESSION: 1. Severely limited exam due to extensive motion artifact, lack of IV contrast, and patient's inability to tolerate the full length of the exam. 2. Approximate 30% atheromatous stenosis at the right bifurcation/proximal right ICA. 3. Gross patency of the left carotid artery system, with no appreciable hemodynamically significant stenosis. 4. Gross patency of the dominant left vertebral artery. Right vertebral artery not visualized, and may be occluded, although appears grossly patent at the skull base on corresponding MRA of the head. Electronically Signed   By: Jeannine Boga M.D.   On: 12/05/2019 21:43   MR BRAIN WO CONTRAST  Result Date: 12/05/2019 CLINICAL DATA:  Initial evaluation for acute TIA. Speech abnormality with left arm numbness. EXAM: MRI HEAD WITHOUT CONTRAST MRA HEAD WITHOUT CONTRAST MRA NECK WITHOUT CONTRAST TECHNIQUE: Multiplanar, multiecho pulse sequences of the  brain and surrounding structures were obtained without intravenous contrast. Angiographic images of the Circle of Willis were obtained using MRA technique without intravenous contrast. Angiographic images of the neck were obtained using MRA technique without intravenous contrast. Carotid stenosis measurements (when applicable) are obtained utilizing NASCET criteria, using the distal internal carotid diameter as the denominator. COMPARISON:  Prior CT from earlier the same day. FINDINGS: MRI HEAD FINDINGS Brain: Examination technically limited by motion artifact and patient's inability to tolerate the full length of the exam. Generalized age-related cerebral atrophy. Patchy and confluent T2/FLAIR hyperintensity within the periventricular deep white matter both cerebral hemispheres most consistent with chronic small vessel ischemic disease, moderate to severe nature. Multiple remote lacunar infarcts present about the bilateral basal ganglia, thalami, and pons. Few remote lacunar infarcts noted involving the body of the corpus callosum and hemispheric cerebral white matter as well. Patchy multifocal ischemic infarcts seen involving the cortical and subcortical aspect of the right parieto-occipital region (series 5, image 84). Additional 7 mm acute ischemic infarcts seen involving the right insula/subinsular white matter (series 5, image 77). No associated hemorrhage or mass effect. No other diffusion abnormality to suggest acute or subacute ischemia. Gray-white matter differentiation otherwise maintained. No other areas of encephalomalacia to suggest chronic cortical infarction. No evidence for acute intracranial hemorrhage. No mass lesion, midline shift or mass effect. No hydrocephalus or extra-axial fluid collection. Pituitary gland suprasellar region within normal limits. Midline structures intact. Vascular: Major intracranial vascular flow voids are maintained. Skull and upper cervical spine: Craniocervical junction  within normal limits. Bone marrow signal intensity normal. No scalp soft tissue abnormality. Sinuses/Orbits: Patient status post bilateral ocular lens replacement. Moderate mucosal thickening noted within the right greater than left maxillary sinuses, chronic in appearance. Left sphenoid sinus retention cyst noted as well. Additional scattered mucosal thickening noted within the ethmoidal air cells. Trace right mastoid effusion noted, of doubtful significance. Other: None. MRA  HEAD FINDINGS ANTERIOR CIRCULATION: Examination degraded by motion artifact. Visualized distal cervical segments of the internal carotid arteries are patent with antegrade flow. Petrous, cavernous, and supraclinoid ICAs patent without appreciable flow-limiting stenosis or other abnormality. There is an apparent 3 mm focal outpouching extending superiorly from the right ICA terminus at the origin of the right A1, which could reflect focal vessel tortuosity or possibly small aneurysm, difficult to be certain on this motion degraded exam (series 5, image 111). A1 otherwise segments patent bilaterally. Grossly normal anterior communicating artery complex. Suspected multifocal moderate left A2 and A3 stenoses (series 1057, image 17). Right ACA patent to its distal aspect without appreciable stenosis. Right M1 widely patent proximally. There is a focal severe distal right M1 stenosis just prior to the bifurcation (series 1057, image 9). Right MCA branches perfused distally, although demonstrate diffuse small vessel atheromatous irregularity. Left M1 patent without stenosis. Left MCA branches well perfused, although demonstrate diffuse small vessel atheromatous irregularity. POSTERIOR CIRCULATION: Left vertebral artery dominant and patent to the vertebrobasilar junction without stenosis. Right vertebral artery somewhat diminutive and irregular but is grossly patent as well. Neither PICA well visualized. Basilar patent to its distal aspect without  stenosis. Superior cerebral arteries patent bilaterally. Both PCAs primarily supplied via the basilar. Moderate to severe stenoses noting the mid left P2 segment (series 1045, image 6). Probable short-segment moderate distal right P2 stenosis (series 1045, image 17). Both PCAs are perfused distally, although demonstrate distal small vessel atheromatous irregularity. MRA NECK FINDINGS Examination severely limited by extensive motion artifact, lack of IV contrast, and patient's inability to tolerate the full length of the exam. Visualized aortic arch grossly within normal limits for caliber. Normal branching pattern seen about the arch. No obvious flow-limiting stenosis about the origin the great vessels, although evaluation fairly limited. Right CCA grossly patent to the bifurcation without stenosis. Atheromatous irregularity with suspected at least mild approximate 30% stenosis at the origin of the right ICA (series 7, image 9). Right ICA otherwise patent distally to the skull base without stenosis or occlusion. Partially visualized left CCA patent to the bifurcation. No obvious flow-limiting stenosis about the left bifurcation (series 7, image 19). Left ICA patent distally to the skull base without stenosis or occlusion. Neither origin of either vertebral artery is well visualized. The dominant left vertebral artery appears grossly patent within the neck (series 7, image 17). The diminutive right vertebral artery is not well visualized, and could be possibly occluded, although it appears to be grossly patent at the skull base on corresponding MRA of the head. Evaluation for possible vertebral stenosis or other pathology limited on this exam. IMPRESSION: MRI HEAD IMPRESSION: 1. Technically limited exam due to motion artifact and patient's inability to tolerate the full length of the exam. 2. Patchy multifocal small volume acute ischemic infarcts involving the cortical and subcortical right parieto-occipital region  and right insula. No associated hemorrhage or mass effect. 3. Underlying age-related cerebral atrophy with moderate to severe chronic microvascular ischemic disease, with multiple remote lacunar infarcts involving the bilateral basal ganglia, thalami, and pons. MRA HEAD IMPRESSION: 1. Technically limited exam due to extensive motion artifact. 2. No large vessel occlusion. 3. Severe distal right M1 stenosis, with additional moderate to severe atheromatous stenoses involving the left ACA and PCAs as above. Distal small vessel atheromatous irregularity. 4. Question 3 mm focal outpouching extending superiorly from the right ICA terminus at the origin of the right A1, which could reflect focal vessel tortuosity or possibly small aneurysm.  Difficult to be certain on this motion degraded exam. Attention at follow-up recommended. MRA NECK IMPRESSION: 1. Severely limited exam due to extensive motion artifact, lack of IV contrast, and patient's inability to tolerate the full length of the exam. 2. Approximate 30% atheromatous stenosis at the right bifurcation/proximal right ICA. 3. Gross patency of the left carotid artery system, with no appreciable hemodynamically significant stenosis. 4. Gross patency of the dominant left vertebral artery. Right vertebral artery not visualized, and may be occluded, although appears grossly patent at the skull base on corresponding MRA of the head. Electronically Signed   By: Jeannine Boga M.D.   On: 12/05/2019 21:43   CT HEAD CODE STROKE WO CONTRAST  Result Date: 12/05/2019 CLINICAL DATA:  Code stroke. 69 year old female with abnormal speech and left arm numbness. EXAM: CT HEAD WITHOUT CONTRAST TECHNIQUE: Contiguous axial images were obtained from the base of the skull through the vertex without intravenous contrast. COMPARISON:  Brain MRI 06/21/2019. FINDINGS: Brain: Numerous chronic lacunar infarcts in the bilateral deep gray matter nuclei. Chronic lacunar infarcts in the  brainstem. Associated Patchy and confluent bilateral cerebral white matter hypodensity. No midline shift, ventriculomegaly, mass effect, evidence of mass lesion, intracranial hemorrhage or evidence of cortically based acute infarction. No definite cortical encephalomalacia. Vascular: Calcified atherosclerosis at the skull base. No suspicious intracranial vascular hyperdensity. Skull: No acute osseous abnormality identified. Sinuses/Orbits: Chronic right maxillary sinusitis. Stable left sphenoid sinus retention cysts. Tympanic cavities and mastoids remain well aerated. Other: No acute orbit or scalp soft tissue finding. ASPECTS [facility] Stroke Program Early CT Score) Total score (0-10 with 10 being normal): 10 IMPRESSION: 1. Severe chronic small-vessel ischemia. No acute cortically based infarct or acute intracranial hemorrhage identified. ASPECTS 10. 2. These results were communicated to Dr. Bartholome Bill at 7:48 pm on 12/05/2019 by text page via the [facility] messaging system. 3. Chronic right maxillary sinusitis. Electronically Signed   By: Genevie Ann M.D.   On: 12/05/2019 19:49    PHYSICAL EXAM Pleasant mildly obese elderly African-American lady not in distress.  . Afebrile. Head is nontraumatic. Neck is supple without bruit.    Cardiac exam no murmur or gallop. Lungs are clear to auscultation. Distal pulses are well felt. Neurological Exam :  She is awake alert oriented time place and person. Speech appears quite clear without obvious dysarthria or aphasia. She follows commands well. Extraocular movements are full range without nystagmus. Trace left lower facial asymmetry when she smiles. Tongue midline. Motor system exam no upper or lower extremity drift but weakness of left grip and intrinsic hand muscles and orbits right over left upper extremity. Symmetric lower extremity strength. Mild diminished touch pinprick sensation in the left leg only. Gait not tested. ASSESSMENT/PLAN [name] is a 69  y.o. female with history of GERD, DM2, HTN, HLD, shingles, prior stroke in June 2021 with residual mild left-sided numbness and memory deficits presenting with 2 episodes of lsurred speech and possible facial droop..   Stroke:   Patchy R parieto-occipital infarcts most likely due to intracranial atherosclerosis  Code Stroke CT head No acute abnormality. Small vessel disease. ASPECTS 10. Sinus dz  MRI  Patchy R parieto-occipital and R insula cortical / subcortical infarcts. Small vessel disease. Atrophy. Multiple old B basal ganglia, thalamic and pontine infarcts.   MRA head no LVO. Severe distal R M1, moderate to severe L ACA + PCA stenoses. Small vessel disease. ?34mm R ICA origin R A1 aneurysm   MRA neck R ICA bifurcation/proximal  30% atherosclerosis.   Carotid Doppler  B ICA 1-39% stenosis, VAs antegrade   2D Echo EF 60-65%. No source of embolus   LDL 49  HgbA1c 8.4  VTE prophylaxis - Lovenox 30 mg sq daily   clopidogrel 75 mg daily prior to admission, now on aspirin 81 mg daily and clopidogrel 75 mg daily. Continue DAPT x 3 mos then plavix alone.   Therapy recommendations:  OP PT & OT  Disposition:  pending   Hypertension  Stable . Permissive hypertension (OK if < 220/120) but gradually normalize in 5-7 days . Long-term BP goal normotensive  Hyperlipidemia  Home meds:  crestor 40, resumed in hospital  LDL 49, goal < 70  Continue statin at discharge  Diabetes type II Uncontrolled  HgbA1c 8.4, goal < 7.0  CBGs Recent Labs    12/05/19 1938 12/05/19 2352 12/06/19 0720  GLUCAP 188* 212* 159*      SSI  Other Stroke Risk Factors  Advanced Age >/= 65   Obesity, Body mass index is 35.14 kg/m., BMI >/= 30 associated with increased stroke risk, recommend weight loss, diet and exercise as   Hx stroke/TIA  05/2019 - R thalamic lacunar infarct secondary to small vessel disease source. Put pn DAPT x 3 weeks then plavix alone.    Family hx stroke (mother,  father, brother TIAs, sister)  Obstructive sleep apnea, enrolled in [place], on CPAP at home  Other Active Problems  Hx kidney cancer  Hospital day # 0 She presented with patchy right MCA infarct secondary to progressive right MCA intracranial atherosclerosis.. Recommend dual antiplatelet therapy of aspirin Plavix for 3 months followed by Plavix alone and aggressive risk factor modification. Patient counseled to be compliant with using her CPAP for her sleep apnea. Continue ongoing therapy and stroke work-up. Long discussion with patient and daughter at the bedside and answered questions. Greater than 50% time during this 35-minute visit was spent on counseling and coordination of care about stroke and answered questions. Antony Contras, MD To contact Stroke Continuity provider, please refer to [email or web address] After hours, contact General Neurology

## 2019-12-07 DIAGNOSIS — G459 Transient cerebral ischemic attack, unspecified: Secondary | ICD-10-CM | POA: Diagnosis not present

## 2019-12-07 DIAGNOSIS — I6300 Cerebral infarction due to thrombosis of unspecified precerebral artery: Secondary | ICD-10-CM | POA: Diagnosis not present

## 2019-12-07 LAB — GLUCOSE, CAPILLARY
Glucose-Capillary: 160 mg/dL — ABNORMAL HIGH (ref 70–99)
Glucose-Capillary: 193 mg/dL — ABNORMAL HIGH (ref 70–99)

## 2019-12-07 LAB — BASIC METABOLIC PANEL
Anion gap: 9 (ref 5–15)
BUN: 44 mg/dL — ABNORMAL HIGH (ref 8–23)
CO2: 21 mmol/L — ABNORMAL LOW (ref 22–32)
Calcium: 9.5 mg/dL (ref 8.9–10.3)
Chloride: 108 mmol/L (ref 98–111)
Creatinine, Ser: 1.94 mg/dL — ABNORMAL HIGH (ref 0.44–1.00)
GFR, Estimated: 28 mL/min — ABNORMAL LOW (ref >60)
Glucose, Bld: 171 mg/dL — ABNORMAL HIGH (ref 70–99)
Potassium: 4.9 mmol/L (ref 3.5–5.1)
Sodium: 138 mmol/L (ref 135–145)

## 2019-12-07 LAB — CBC
HCT: 33.9 % — ABNORMAL LOW (ref 36.0–46.0)
Hemoglobin: 10.8 g/dL — ABNORMAL LOW (ref 12.0–15.0)
MCH: 28.6 pg (ref 26.0–34.0)
MCHC: 31.9 g/dL (ref 30.0–36.0)
MCV: 89.7 fL (ref 80.0–100.0)
Platelets: 269 10*3/uL (ref 150–400)
RBC: 3.78 MIL/uL — ABNORMAL LOW (ref 3.87–5.11)
RDW: 14.2 % (ref 11.5–15.5)
WBC: 6.5 10*3/uL (ref 4.0–10.5)
nRBC: 0 % (ref 0.0–0.2)

## 2019-12-07 LAB — PHOSPHORUS: Phosphorus: 3.6 mg/dL (ref 2.5–4.6)

## 2019-12-07 LAB — MAGNESIUM: Magnesium: 2.2 mg/dL (ref 1.7–2.4)

## 2019-12-07 MED ORDER — ASPIRIN 81 MG PO TBEC: 81.0000 mg | DELAYED_RELEASE_TABLET | Freq: Every day | ORAL | 0 refills | Status: AC

## 2019-12-07 NOTE — Progress Notes (Signed)
Physical Therapy Treatment Patient Details Name: Briana Garrison MRN: [id number] DOB: [birth year] Today's Date: 12/07/2019    History of Present Illness Pt is a 69 y/o female with PMHx of previous stroke in May 2021 with residual L side weakness/sesnation changes, hx of DM, HTN, kidney CA. Pt now presenting with slurred speech and L facial droop noticed by pt's daughter who brought her to the ER. MRI revealed acute CVA involving the right parieto-occipital and right insular region.     PT Comments    Pt tolerates treatment well, ambulating for household distances and negotiating stairs without physical assistance. Pt is able to perform dynamics balance activities while dressing, toileting, and washing hands without assistance or UE support. Pt will benefit from further dynamic balance training with acute PT services, as well as outpatient PT services at the time of discharge.   Follow Up Recommendations  Outpatient PT;Supervision - Intermittent     Equipment Recommendations  None recommended by PT    Recommendations for Other Services       Precautions / Restrictions Precautions Precautions: Fall Restrictions Weight Bearing Restrictions: No    Mobility  Bed Mobility Overal bed mobility: Modified Independent Bed Mobility: Supine to Sit     Supine to sit: Modified independent (Device/Increase time);HOB elevated     General bed mobility comments: use of rails  Transfers Overall transfer level: Needs assistance Equipment used: Quad cane Transfers: Sit to/from Stand Sit to Stand: Supervision            Ambulation/Gait Ambulation/Gait assistance: Supervision Gait Distance (Feet): 200 Feet Assistive device: Quad cane Gait Pattern/deviations: Step-to pattern Gait velocity: reduced Gait velocity interpretation: <1.8 ft/sec, indicate of risk for recurrent falls General Gait Details: pt with short step to gait, no LOB noted   Stairs Stairs: Yes Stairs assistance:  Supervision Stair Management: No rails;Step to pattern Number of Stairs: 2     Wheelchair Mobility    Modified Rankin (Stroke Patients Only) Modified Rankin (Stroke Patients Only) Pre-Morbid Rankin Score: Moderate disability Modified Rankin: Moderately severe disability     Balance Overall balance assessment: Needs assistance Sitting-balance support: No upper extremity supported;Feet supported Sitting balance-Leahy Scale: Good     Standing balance support: No upper extremity supported;During functional activity Standing balance-Leahy Scale: Good Standing balance comment: supervision                            Cognition Arousal/Alertness: Awake/alert Behavior During Therapy: WFL for tasks assessed/performed Overall Cognitive Status: Within Functional Limits for tasks assessed                                        Exercises      General Comments General comments (skin integrity, edema, etc.): VSS on RA      Pertinent Vitals/Pain Pain Assessment: No/denies pain    Home Living                      Prior Function            PT Goals (current goals can now be found in the care plan section) Acute Rehab PT Goals Patient Stated Goal: home, start outpt therapies Progress towards PT goals: Progressing toward goals    Frequency    Min 4X/week      PT Plan Current plan remains appropriate  Co-evaluation              AM-PAC PT "6 Clicks" Mobility   Outcome Measure  Help needed turning from your back to your side while in a flat bed without using bedrails?: None Help needed moving from lying on your back to sitting on the side of a flat bed without using bedrails?: None Help needed moving to and from a bed to a chair (including a wheelchair)?: None Help needed standing up from a chair using your arms (e.g., wheelchair or bedside chair)?: None Help needed to walk in hospital room?: None Help needed climbing 3-5  steps with a railing? : None 6 Click Score: 24    End of Session   Activity Tolerance: Patient tolerated treatment well Patient left: in chair;with call bell/phone within reach;with chair alarm set Nurse Communication: Mobility status PT Visit Diagnosis: Other abnormalities of gait and mobility (R26.89)     Time: 0623-7628 PT Time Calculation (min) (ACUTE ONLY): 26 min  Charges:  $Gait Training: 8-22 mins $Therapeutic Activity: 8-22 mins                     Zenaida Niece, PT, DPT Acute Rehabilitation Pager: [phone]    Zenaida Niece 12/07/2019, 9:17 AM

## 2019-12-07 NOTE — TOC Transition Note (Signed)
Transition of Care [facility]) - CM/SW Discharge Note   Patient Details  Name: MILEYDI MILSAP MRN: [id number] Date of Birth: 1950/07/13  [facility] CM/SW Contact:  Pollie Friar, RN Phone Number: 12/07/2019, 2:42 PM   Clinical Narrative:    Pt is discharging home with outpatient therapy. CM met with the patient and she states she is scheduled to start PT/OT at [facility] in [place] on the 15th. CM attempted to reach [facility] but they are closed today. CM updated the patient and CM will reach out to [facility] tomorrow and fax them the orders.  DME at home: cane, walker, shower chair, 3 in 1 Pt has supervision at home and transportation to home.   Final next level of care: OP Rehab Barriers to Discharge: No Barriers Identified   Patient Goals and CMS Choice     Choice offered to / list presented to : Patient  Discharge Placement                       Discharge Plan and Services                                     Social Determinants of Health (SDOH) Interventions     Readmission Risk Interventions Readmission Risk Prevention Plan 06/22/2019  Transportation Screening Complete  PCP or Specialist Appt within 5-7 Days Complete  Home Care Screening Complete  Medication Review (RN CM) Referral to Pharmacy  Some recent data might be hidden

## 2019-12-07 NOTE — Progress Notes (Signed)
STROKE TEAM PROGRESS NOTE   INTERVAL HISTORY Her daughter is at the bedside.  Patient states she is doing well.  She is wanting to go home.Marland Kitchen  Neurological exam is unchanged.  Vital signs are stable.  Vitals:   12/06/19 2324 12/07/19 0313 12/07/19 0717 12/07/19 1112  BP: (!) 150/64 (!) 141/72 137/72 (!) 163/76  Pulse: 74 75 72 66  Resp: 17 18 18 18   Temp: 98.2 F (36.8 C) 97.6 F (36.4 C) 97.9 F (36.6 C) (!) 97.5 F (36.4 C)  TempSrc: Oral Oral Oral Oral  SpO2: 98% 99% 99% 100%  Weight:      Height:       CBC:  Recent Labs  Lab 12/05/19 1937 12/05/19 1953 12/06/19 0022 12/07/19 0253  WBC 6.6   < > 6.3 6.5  NEUTROABS 3.6  --   --   --   HGB 10.9*   < > 10.2* 10.8*  HCT 34.4*   < > 32.4* 33.9*  MCV 90.8   < > 91.0 89.7  PLT 270   < > 240 269   < > = values in this interval not displayed.   Basic Metabolic Panel:  Recent Labs  Lab 12/05/19 1937 12/05/19 1937 12/05/19 1953 12/05/19 1953 12/06/19 0022 12/07/19 0253  NA 133*   < > 138  --   --  138  K 4.8   < > 4.7  --   --  4.9  CL 105   < > 105  --   --  108  CO2 21*  --   --   --   --  21*  GLUCOSE 204*   < > 199*  --   --  171*  BUN 48*   < > 46*  --   --  44*  CREATININE 2.29*   < > 2.70*   < > 2.12* 1.94*  CALCIUM 8.9  --   --   --   --  9.5  MG  --   --   --   --   --  2.2  PHOS  --   --   --   --   --  3.6   < > = values in this interval not displayed.   Lipid Panel:  Recent Labs  Lab 12/06/19 0246  CHOL 119  TRIG 150*  HDL 40*  CHOLHDL 3.0  VLDL 30  LDLCALC 49   HgbA1c:  Recent Labs  Lab 12/06/19 0246  HGBA1C 8.4*   Urine Drug Screen: No results for input(s): LABOPIA, COCAINSCRNUR, LABBENZ, AMPHETMU, THCU, LABBARB in the last 168 hours.  Alcohol Level No results for input(s): ETH in the last 168 hours.  IMAGING past 24 hours No results found.  PHYSICAL EXAM Pleasant mildly obese elderly African-American lady not in distress.  . Afebrile. Head is nontraumatic. Neck is supple without  bruit.    Cardiac exam no murmur or gallop. Lungs are clear to auscultation. Distal pulses are well felt. Neurological Exam :  She is awake alert oriented time place and person. Speech appears quite clear without obvious dysarthria or aphasia. She follows commands well. Extraocular movements are full range without nystagmus. Trace left lower facial asymmetry when she smiles. Tongue midline. Motor system exam no upper or lower extremity drift but weakness of left grip and intrinsic hand muscles and orbits right over left upper extremity. Symmetric lower extremity strength. Mild diminished touch pinprick sensation in the left leg only. Gait not tested. ASSESSMENT/PLAN  [name] is a 69 y.o. female with history of GERD, DM2, HTN, HLD, shingles, prior stroke in June 2021 with residual mild left-sided numbness and memory deficits presenting with 2 episodes of lsurred speech and possible facial droop..   Stroke:   Patchy R parieto-occipital infarcts most likely due to intracranial atherosclerosis  Code Stroke CT head No acute abnormality. Small vessel disease. ASPECTS 10. Sinus dz  MRI  Patchy R parieto-occipital and R insula cortical / subcortical infarcts. Small vessel disease. Atrophy. Multiple old B basal ganglia, thalamic and pontine infarcts.   MRA head no LVO. Severe distal R M1, moderate to severe L ACA + PCA stenoses. Small vessel disease. ?84mm R ICA origin R A1 aneurysm   MRA neck R ICA bifurcation/proximal 30% atherosclerosis.   Carotid Doppler  B ICA 1-39% stenosis, VAs antegrade   2D Echo EF 60-65%. No source of embolus   LDL 49  HgbA1c 8.4  VTE prophylaxis - Lovenox 30 mg sq daily   clopidogrel 75 mg daily prior to admission, now on aspirin 81 mg daily and clopidogrel 75 mg daily. Continue DAPT x 3 mos then plavix alone.   Therapy recommendations:  OP PT & OT  Disposition:  pending   Hypertension  Stable . Permissive hypertension (OK if < 220/120) but gradually  normalize in 5-7 days . Long-term BP goal normotensive  Hyperlipidemia  Home meds:  crestor 40, resumed in hospital  LDL 49, goal < 70  Continue statin at discharge  Diabetes type II Uncontrolled  HgbA1c 8.4, goal < 7.0  CBGs Recent Labs    12/06/19 2120 12/07/19 0616 12/07/19 1112  GLUCAP 187* 160* 193*      SSI  Other Stroke Risk Factors  Advanced Age >/= 65   Obesity, Body mass index is 34.99 kg/m., BMI >/= 30 associated with increased stroke risk, recommend weight loss, diet and exercise as   Hx stroke/TIA  05/2019 - R thalamic lacunar infarct secondary to small vessel disease source. Put pn DAPT x 3 weeks then plavix alone.    Family hx stroke (mother, father, brother TIAs, sister)  Obstructive sleep apnea, enrolled in [place], on CPAP at home  Other Active Problems  Hx kidney cancer  Hospital day # 0 She presented with patchy right MCA infarct secondary to progressive right MCA intracranial atherosclerosis.. Recommend dual antiplatelet therapy of aspirin Plavix for 3 months followed by Plavix alone and aggressive risk factor modification. Patient counseled to be compliant with using her CPAP for her sleep apnea. Long discussion with patient and daughter at the bedside and answered questions.  Follow-up as an outpatient in the sleep smart study research visit as scheduled.  Stroke team will sign off.  Kindly call for questions Antony Contras, MD To contact Stroke Continuity provider, please refer to [email or web address] After hours, contact General Neurology

## 2019-12-08 NOTE — Discharge Summary (Signed)
Physician Discharge Summary   Briana Garrison VQQ:[id number] DOB: 28-Jun-1950 DOA: 12/05/2019  PCP: [name], MD  Admit date: 12/05/2019 Discharge date: 12/08/2019  Admitted From: home Disposition:  Home  Discharging physician: Dwyane Dee, MD  Recommendations for Outpatient Follow-up:  1. Patient called on 11/11 to clarify the DAPT at discharge (see course below), she understands asa/plavix for 3 months then plavix only after 2. Follow up with neurology 3. Continue PT  Patient discharged to home in Discharge Condition: stable CODE STATUS: Full  Diet recommendation:  Diet Orders (From admission, onward)    Start     Ordered   12/07/19 0000  Diet - low sodium heart healthy        12/07/19 1301          Hospital Course: Briana Garrison is a 69 year old female with history of  stroke with left-sided weakness in May 2021 presented to the emergency department with slurring of speech which resolved.   Patient was admitted for CVA, TIA work-up.  CT shows no acute abnormality.  MRI brain shows acute CVA involving the right parieto-occipital and right insular region.  MRA of the brain and neck does not show any large vessel obstruction.  Neurology was consulted patient seemed not a candidate for TPA.  Patient is admitted for a stroke work-up. She was recommended to continue on dual antiplatelet therapy with aspirin and Plavix for 3 months then followed by Plavix alone. She was evaluated by physical therapy during hospitalization and recommended for outpatient PT at discharge. Of note, her d/c paperwork stated asa for 21 days and long term Plavix. I called and spoke with the patient at 4:45 pm on 11/11 and informed her the recommendation was actually for asa and plavix for 3 months then to continue on plavix long term after that. She read back the recommendations properly and correctly to me. She also states she has appts made with her PCP, PT, and plans to call neurology for a follow up as  well.    No new Assessment & Plan notes have been filed under this hospital service since the last note was generated. Service: Hospitalist   The patient's chronic medical conditions were treated accordingly per the patient's home medication regimen except as noted.  On day of discharge, patient was felt deemed stable for discharge. Patient/family member advised to call PCP or come back to ER if needed.   Principal Diagnosis: CVA (cerebral vascular accident) [facility])  Discharge Diagnoses: Active Hospital Problems   Diagnosis Date Noted  . CVA (cerebral vascular accident) ([place]) 12/05/2019  . History of renal cell carcinoma 06/21/2019  . Type 2 diabetes mellitus with hyperlipidemia ([place]) 06/23/2011  . Essential hypertension 06/23/2011  . Normocytic anemia 06/23/2011    Resolved Hospital Problems  No resolved problems to display.    Discharge Instructions    Ambulatory referral to Occupational Therapy   Complete by: As directed    Ambulatory referral to Physical Therapy   Complete by: As directed    Diet - low sodium heart healthy   Complete by: As directed    Increase activity slowly   Complete by: As directed      Allergies as of 12/07/2019      Reactions   Amlodipine Swelling   Tramadol Diarrhea, Nausea And Vomiting      Medication List    STOP taking these medications   rosuvastatin 40 MG tablet Commonly known as: CRESTOR     TAKE these medications  acetaminophen 500 MG tablet Commonly known as: TYLENOL Take 500 mg by mouth every 6 (six) hours as needed for mild pain or headache.   aspirin 81 MG EC tablet Take 1 tablet (81 mg total) by mouth daily for 21 days. Swallow whole.   atorvastatin 80 MG tablet Commonly known as: LIPITOR Take 1 tablet (80 mg total) by mouth daily.   carvedilol 25 MG tablet Commonly known as: COREG Take 25 mg by mouth 2 (two) times daily. What changed: Another medication with the same name was removed. Continue taking this  medication, and follow the directions you see here.   cetirizine 10 MG tablet Commonly known as: ZYRTEC Take 10 mg by mouth daily as needed for allergies.   cloNIDine 0.1 MG tablet Commonly known as: CATAPRES Take 0.2 mg by mouth 2 (two) times daily.   clopidogrel 75 MG tablet Commonly known as: PLAVIX Take 1 tablet (75 mg total) by mouth daily.   furosemide 20 MG tablet Commonly known as: LASIX Take 40 mg by mouth 2 (two) times daily.   gabapentin 100 MG capsule Commonly known as: NEURONTIN Take 100 mg by mouth daily as needed (shoulder pain).   glucose 4 GM chewable tablet Chew 1 tablet by mouth once as needed for low blood sugar.   insulin glargine 100 unit/mL Sopn Commonly known as: LANTUS Inject 18 Units into the skin at bedtime.   NovoLOG FlexPen 100 UNIT/ML FlexPen Generic drug: insulin aspart Inject into the skin 3 (three) times daily with meals. Per sliding scale - based on CBG   ProAir HFA 108 (90 Base) MCG/ACT inhaler Generic drug: albuterol Inhale 2 puffs into the lungs every 6 (six) hours as needed for wheezing or shortness of breath.   senna-docusate 8.6-50 MG tablet Commonly known as: Senokot-S Take 1 tablet by mouth daily as needed for mild constipation.   telmisartan 20 MG tablet Commonly known as: MICARDIS Take 20 mg by mouth daily.   Trulicity 1.5 EX/5.1ZG Sopn Generic drug: Dulaglutide Inject 1.5 mg into the skin every Tuesday.   Trulicity 3 YF/7.4BS Sopn Generic drug: Dulaglutide Inject 3 mg into the skin every Tuesday.   vitamin B-12 1000 MCG tablet Commonly known as: CYANOCOBALAMIN Take 1,000 mcg by mouth daily.   Vitamin D-3 25 MCG (1000 UT) Caps Take 1,000 Units by mouth daily.       Follow-up Information    [facility] therapy Follow up.   Why: Call [facility] for next appointment Contact information: [place], [place] [address] [phone]             Allergies  Allergen Reactions  . Amlodipine Swelling  . Tramadol  Diarrhea and Nausea And Vomiting    Consultations: Neuro  Discharge Exam: BP (!) 163/76 (BP Location: Right Arm)   Pulse 66   Temp (!) 97.5 F (36.4 C) (Oral)   Resp 18   Ht 5\' 1"  (1.549 m)   Wt 84 kg   SpO2 100%   BMI 34.99 kg/m  General appearance: alert, cooperative and no distress Head: Normocephalic, without obvious abnormality, atraumatic Eyes: EOMI Lungs: clear to auscultation bilaterally Heart: regular rate and rhythm and S1, S2 normal Abdomen: soft, NT, ND, BS present Extremities: no edema Skin: mobility and turgor normal Neurologic: subtle LUE weakness 4+/5, but otherwise normal throughout; no obvious paresthesias  The results of significant diagnostics from this hospitalization (including imaging, microbiology, ancillary and laboratory) are listed below for reference.   Microbiology: Recent Results (from the past 240  hour(s))  Respiratory Panel by RT PCR (Flu A&B, Covid) - Nasopharyngeal Swab     Status: None   Collection Time: 12/05/19  8:02 PM   Specimen: Nasopharyngeal Swab  Result Value Ref Range Status   SARS Coronavirus 2 by RT PCR NEGATIVE NEGATIVE Final    Comment: (NOTE) SARS-CoV-2 target nucleic acids are NOT DETECTED.  The SARS-CoV-2 RNA is generally detectable in upper respiratoy specimens during the acute phase of infection. The lowest concentration of SARS-CoV-2 viral copies this assay can detect is 131 copies/mL. A negative result does not preclude SARS-Cov-2 infection and should not be used as the sole basis for treatment or other patient management decisions. A negative result may occur with  improper specimen collection/handling, submission of specimen other than nasopharyngeal swab, presence of viral mutation(s) within the areas targeted by this assay, and inadequate number of viral copies (<131 copies/mL). A negative result must be combined with clinical observations, patient history, and epidemiological information. The expected result  is Negative.  Fact Sheet for Patients:  PinkCheek.be  Fact Sheet for Healthcare Providers:  GravelBags.it  This test is no t yet approved or cleared by the [place] FDA and  has been authorized for detection and/or diagnosis of SARS-CoV-2 by FDA under an Emergency Use Authorization (EUA). This EUA will remain  in effect (meaning this test can be used) for the duration of the COVID-19 declaration under Section 564(b)(1) of the Act, 21 U.S.C. section 360bbb-3(b)(1), unless the authorization is terminated or revoked sooner.     Influenza A by PCR NEGATIVE NEGATIVE Final   Influenza B by PCR NEGATIVE NEGATIVE Final    Comment: (NOTE) The Xpert Xpress SARS-CoV-2/FLU/RSV assay is intended as an aid in  the diagnosis of influenza from Nasopharyngeal swab specimens and  should not be used as a sole basis for treatment. Nasal washings and  aspirates are unacceptable for Xpert Xpress SARS-CoV-2/FLU/RSV  testing.  Fact Sheet for Patients: PinkCheek.be  Fact Sheet for Healthcare Providers: GravelBags.it  This test is not yet approved or cleared by the [place] FDA and  has been authorized for detection and/or diagnosis of SARS-CoV-2 by  FDA under an Emergency Use Authorization (EUA). This EUA will remain  in effect (meaning this test can be used) for the duration of the  Covid-19 declaration under Section 564(b)(1) of the Act, 21  U.S.C. section 360bbb-3(b)(1), unless the authorization is  terminated or revoked. Performed at [facility], [place] [address] [place], [place] [address]      Labs: BNP (last 3 results) No results for input(s): BNP in the last 8760 hours. Basic Metabolic Panel: Recent Labs  Lab 12/05/19 1937 12/05/19 1953 12/06/19 0022 12/07/19 0253  NA 133* 138  --  138  K 4.8 4.7  --  4.9  CL 105 105  --  108  CO2 21*  --   --  21*   GLUCOSE 204* 199*  --  171*  BUN 48* 46*  --  44*  CREATININE 2.29* 2.70* 2.12* 1.94*  CALCIUM 8.9  --   --  9.5  MG  --   --   --  2.2  PHOS  --   --   --  3.6   Liver Function Tests: Recent Labs  Lab 12/05/19 1937  AST 19  ALT 18  ALKPHOS 82  BILITOT 0.6  PROT 6.4*  ALBUMIN 3.5   No results for input(s): LIPASE, AMYLASE in the last 168 hours. No results for  input(s): AMMONIA in the last 168 hours. CBC: Recent Labs  Lab 12/05/19 1937 12/05/19 1953 12/06/19 0022 12/07/19 0253  WBC 6.6  --  6.3 6.5  NEUTROABS 3.6  --   --   --   HGB 10.9* 10.9* 10.2* 10.8*  HCT 34.4* 32.0* 32.4* 33.9*  MCV 90.8  --  91.0 89.7  PLT 270  --  240 269   Cardiac Enzymes: No results for input(s): CKTOTAL, CKMB, CKMBINDEX, TROPONINI in the last 168 hours. BNP: Invalid input(s): POCBNP CBG: Recent Labs  Lab 12/06/19 1218 12/06/19 1715 12/06/19 2120 12/07/19 0616 12/07/19 1112  GLUCAP 127* 212* 187* 160* 193*   D-Dimer No results for input(s): DDIMER in the last 72 hours. Hgb A1c Recent Labs    12/06/19 0022 12/06/19 0246  HGBA1C 8.4* 8.4*   Lipid Profile Recent Labs    12/06/19 0246  CHOL 119  HDL 40*  LDLCALC 49  TRIG 150*  CHOLHDL 3.0   Thyroid function studies No results for input(s): TSH, T4TOTAL, T3FREE, THYROIDAB in the last 72 hours.  Invalid input(s): FREET3 Anemia work up No results for input(s): VITAMINB12, FOLATE, FERRITIN, TIBC, IRON, RETICCTPCT in the last 72 hours. Urinalysis    Component Value Date/Time   COLORURINE YELLOW 07/25/2019 0508   APPEARANCEUR HAZY (A) 07/25/2019 0508   LABSPEC 1.009 07/25/2019 0508   PHURINE 5.0 07/25/2019 0508   GLUCOSEU 50 (A) 07/25/2019 0508   HGBUR MODERATE (A) 07/25/2019 0508   BILIRUBINUR NEGATIVE 07/25/2019 0508   KETONESUR NEGATIVE 07/25/2019 0508   PROTEINUR 30 (A) 07/25/2019 0508   UROBILINOGEN 0.2 06/23/2011 1100   NITRITE NEGATIVE 07/25/2019 0508   LEUKOCYTESUR MODERATE (A) 07/25/2019 0508   Sepsis  Labs Invalid input(s): PROCALCITONIN,  WBC,  LACTICIDVEN Microbiology Recent Results (from the past 240 hour(s))  Respiratory Panel by RT PCR (Flu A&B, Covid) - Nasopharyngeal Swab     Status: None   Collection Time: 12/05/19  8:02 PM   Specimen: Nasopharyngeal Swab  Result Value Ref Range Status   SARS Coronavirus 2 by RT PCR NEGATIVE NEGATIVE Final    Comment: (NOTE) SARS-CoV-2 target nucleic acids are NOT DETECTED.  The SARS-CoV-2 RNA is generally detectable in upper respiratoy specimens during the acute phase of infection. The lowest concentration of SARS-CoV-2 viral copies this assay can detect is 131 copies/mL. A negative result does not preclude SARS-Cov-2 infection and should not be used as the sole basis for treatment or other patient management decisions. A negative result may occur with  improper specimen collection/handling, submission of specimen other than nasopharyngeal swab, presence of viral mutation(s) within the areas targeted by this assay, and inadequate number of viral copies (<131 copies/mL). A negative result must be combined with clinical observations, patient history, and epidemiological information. The expected result is Negative.  Fact Sheet for Patients:  PinkCheek.be  Fact Sheet for Healthcare Providers:  GravelBags.it  This test is no t yet approved or cleared by the [place] FDA and  has been authorized for detection and/or diagnosis of SARS-CoV-2 by FDA under an Emergency Use Authorization (EUA). This EUA will remain  in effect (meaning this test can be used) for the duration of the COVID-19 declaration under Section 564(b)(1) of the Act, 21 U.S.C. section 360bbb-3(b)(1), unless the authorization is terminated or revoked sooner.     Influenza A by PCR NEGATIVE NEGATIVE Final   Influenza B by PCR NEGATIVE NEGATIVE Final    Comment: (NOTE) The Xpert Xpress SARS-CoV-2/FLU/RSV assay  is intended as  an aid in  the diagnosis of influenza from Nasopharyngeal swab specimens and  should not be used as a sole basis for treatment. Nasal washings and  aspirates are unacceptable for Xpert Xpress SARS-CoV-2/FLU/RSV  testing.  Fact Sheet for Patients: PinkCheek.be  Fact Sheet for Healthcare Providers: GravelBags.it  This test is not yet approved or cleared by the [place] FDA and  has been authorized for detection and/or diagnosis of SARS-CoV-2 by  FDA under an Emergency Use Authorization (EUA). This EUA will remain  in effect (meaning this test can be used) for the duration of the  Covid-19 declaration under Section 564(b)(1) of the Act, 21  U.S.C. section 360bbb-3(b)(1), unless the authorization is  terminated or revoked. Performed at [facility], [place] [address], [place], [place] [address]     Procedures/Studies: MR ANGIO HEAD WO CONTRAST  Result Date: 12/05/2019 CLINICAL DATA:  Initial evaluation for acute TIA. Speech abnormality with left arm numbness. EXAM: MRI HEAD WITHOUT CONTRAST MRA HEAD WITHOUT CONTRAST MRA NECK WITHOUT CONTRAST TECHNIQUE: Multiplanar, multiecho pulse sequences of the brain and surrounding structures were obtained without intravenous contrast. Angiographic images of the Circle of Willis were obtained using MRA technique without intravenous contrast. Angiographic images of the neck were obtained using MRA technique without intravenous contrast. Carotid stenosis measurements (when applicable) are obtained utilizing NASCET criteria, using the distal internal carotid diameter as the denominator. COMPARISON:  Prior CT from earlier the same day. FINDINGS: MRI HEAD FINDINGS Brain: Examination technically limited by motion artifact and patient's inability to tolerate the full length of the exam. Generalized age-related cerebral atrophy. Patchy and confluent T2/FLAIR hyperintensity within the  periventricular deep white matter both cerebral hemispheres most consistent with chronic small vessel ischemic disease, moderate to severe nature. Multiple remote lacunar infarcts present about the bilateral basal ganglia, thalami, and pons. Few remote lacunar infarcts noted involving the body of the corpus callosum and hemispheric cerebral white matter as well. Patchy multifocal ischemic infarcts seen involving the cortical and subcortical aspect of the right parieto-occipital region (series 5, image 84). Additional 7 mm acute ischemic infarcts seen involving the right insula/subinsular white matter (series 5, image 77). No associated hemorrhage or mass effect. No other diffusion abnormality to suggest acute or subacute ischemia. Gray-white matter differentiation otherwise maintained. No other areas of encephalomalacia to suggest chronic cortical infarction. No evidence for acute intracranial hemorrhage. No mass lesion, midline shift or mass effect. No hydrocephalus or extra-axial fluid collection. Pituitary gland suprasellar region within normal limits. Midline structures intact. Vascular: Major intracranial vascular flow voids are maintained. Skull and upper cervical spine: Craniocervical junction within normal limits. Bone marrow signal intensity normal. No scalp soft tissue abnormality. Sinuses/Orbits: Patient status post bilateral ocular lens replacement. Moderate mucosal thickening noted within the right greater than left maxillary sinuses, chronic in appearance. Left sphenoid sinus retention cyst noted as well. Additional scattered mucosal thickening noted within the ethmoidal air cells. Trace right mastoid effusion noted, of doubtful significance. Other: None. MRA HEAD FINDINGS ANTERIOR CIRCULATION: Examination degraded by motion artifact. Visualized distal cervical segments of the internal carotid arteries are patent with antegrade flow. Petrous, cavernous, and supraclinoid ICAs patent without appreciable  flow-limiting stenosis or other abnormality. There is an apparent 3 mm focal outpouching extending superiorly from the right ICA terminus at the origin of the right A1, which could reflect focal vessel tortuosity or possibly small aneurysm, difficult to be certain on this motion degraded exam (series 5, image 111). A1 otherwise segments patent bilaterally.  Grossly normal anterior communicating artery complex. Suspected multifocal moderate left A2 and A3 stenoses (series 1057, image 17). Right ACA patent to its distal aspect without appreciable stenosis. Right M1 widely patent proximally. There is a focal severe distal right M1 stenosis just prior to the bifurcation (series 1057, image 9). Right MCA branches perfused distally, although demonstrate diffuse small vessel atheromatous irregularity. Left M1 patent without stenosis. Left MCA branches well perfused, although demonstrate diffuse small vessel atheromatous irregularity. POSTERIOR CIRCULATION: Left vertebral artery dominant and patent to the vertebrobasilar junction without stenosis. Right vertebral artery somewhat diminutive and irregular but is grossly patent as well. Neither PICA well visualized. Basilar patent to its distal aspect without stenosis. Superior cerebral arteries patent bilaterally. Both PCAs primarily supplied via the basilar. Moderate to severe stenoses noting the mid left P2 segment (series 1045, image 6). Probable short-segment moderate distal right P2 stenosis (series 1045, image 17). Both PCAs are perfused distally, although demonstrate distal small vessel atheromatous irregularity. MRA NECK FINDINGS Examination severely limited by extensive motion artifact, lack of IV contrast, and patient's inability to tolerate the full length of the exam. Visualized aortic arch grossly within normal limits for caliber. Normal branching pattern seen about the arch. No obvious flow-limiting stenosis about the origin the great vessels, although evaluation  fairly limited. Right CCA grossly patent to the bifurcation without stenosis. Atheromatous irregularity with suspected at least mild approximate 30% stenosis at the origin of the right ICA (series 7, image 9). Right ICA otherwise patent distally to the skull base without stenosis or occlusion. Partially visualized left CCA patent to the bifurcation. No obvious flow-limiting stenosis about the left bifurcation (series 7, image 19). Left ICA patent distally to the skull base without stenosis or occlusion. Neither origin of either vertebral artery is well visualized. The dominant left vertebral artery appears grossly patent within the neck (series 7, image 17). The diminutive right vertebral artery is not well visualized, and could be possibly occluded, although it appears to be grossly patent at the skull base on corresponding MRA of the head. Evaluation for possible vertebral stenosis or other pathology limited on this exam. IMPRESSION: MRI HEAD IMPRESSION: 1. Technically limited exam due to motion artifact and patient's inability to tolerate the full length of the exam. 2. Patchy multifocal small volume acute ischemic infarcts involving the cortical and subcortical right parieto-occipital region and right insula. No associated hemorrhage or mass effect. 3. Underlying age-related cerebral atrophy with moderate to severe chronic microvascular ischemic disease, with multiple remote lacunar infarcts involving the bilateral basal ganglia, thalami, and pons. MRA HEAD IMPRESSION: 1. Technically limited exam due to extensive motion artifact. 2. No large vessel occlusion. 3. Severe distal right M1 stenosis, with additional moderate to severe atheromatous stenoses involving the left ACA and PCAs as above. Distal small vessel atheromatous irregularity. 4. Question 3 mm focal outpouching extending superiorly from the right ICA terminus at the origin of the right A1, which could reflect focal vessel tortuosity or possibly small  aneurysm. Difficult to be certain on this motion degraded exam. Attention at follow-up recommended. MRA NECK IMPRESSION: 1. Severely limited exam due to extensive motion artifact, lack of IV contrast, and patient's inability to tolerate the full length of the exam. 2. Approximate 30% atheromatous stenosis at the right bifurcation/proximal right ICA. 3. Gross patency of the left carotid artery system, with no appreciable hemodynamically significant stenosis. 4. Gross patency of the dominant left vertebral artery. Right vertebral artery not visualized, and may be occluded, although appears  grossly patent at the skull base on corresponding MRA of the head. Electronically Signed   By: Jeannine Boga M.D.   On: 12/05/2019 21:43   MR MRA NECK WO CONTRAST  Result Date: 12/05/2019 CLINICAL DATA:  Initial evaluation for acute TIA. Speech abnormality with left arm numbness. EXAM: MRI HEAD WITHOUT CONTRAST MRA HEAD WITHOUT CONTRAST MRA NECK WITHOUT CONTRAST TECHNIQUE: Multiplanar, multiecho pulse sequences of the brain and surrounding structures were obtained without intravenous contrast. Angiographic images of the Circle of Willis were obtained using MRA technique without intravenous contrast. Angiographic images of the neck were obtained using MRA technique without intravenous contrast. Carotid stenosis measurements (when applicable) are obtained utilizing NASCET criteria, using the distal internal carotid diameter as the denominator. COMPARISON:  Prior CT from earlier the same day. FINDINGS: MRI HEAD FINDINGS Brain: Examination technically limited by motion artifact and patient's inability to tolerate the full length of the exam. Generalized age-related cerebral atrophy. Patchy and confluent T2/FLAIR hyperintensity within the periventricular deep white matter both cerebral hemispheres most consistent with chronic small vessel ischemic disease, moderate to severe nature. Multiple remote lacunar infarcts present  about the bilateral basal ganglia, thalami, and pons. Few remote lacunar infarcts noted involving the body of the corpus callosum and hemispheric cerebral white matter as well. Patchy multifocal ischemic infarcts seen involving the cortical and subcortical aspect of the right parieto-occipital region (series 5, image 84). Additional 7 mm acute ischemic infarcts seen involving the right insula/subinsular white matter (series 5, image 77). No associated hemorrhage or mass effect. No other diffusion abnormality to suggest acute or subacute ischemia. Gray-white matter differentiation otherwise maintained. No other areas of encephalomalacia to suggest chronic cortical infarction. No evidence for acute intracranial hemorrhage. No mass lesion, midline shift or mass effect. No hydrocephalus or extra-axial fluid collection. Pituitary gland suprasellar region within normal limits. Midline structures intact. Vascular: Major intracranial vascular flow voids are maintained. Skull and upper cervical spine: Craniocervical junction within normal limits. Bone marrow signal intensity normal. No scalp soft tissue abnormality. Sinuses/Orbits: Patient status post bilateral ocular lens replacement. Moderate mucosal thickening noted within the right greater than left maxillary sinuses, chronic in appearance. Left sphenoid sinus retention cyst noted as well. Additional scattered mucosal thickening noted within the ethmoidal air cells. Trace right mastoid effusion noted, of doubtful significance. Other: None. MRA HEAD FINDINGS ANTERIOR CIRCULATION: Examination degraded by motion artifact. Visualized distal cervical segments of the internal carotid arteries are patent with antegrade flow. Petrous, cavernous, and supraclinoid ICAs patent without appreciable flow-limiting stenosis or other abnormality. There is an apparent 3 mm focal outpouching extending superiorly from the right ICA terminus at the origin of the right A1, which could reflect  focal vessel tortuosity or possibly small aneurysm, difficult to be certain on this motion degraded exam (series 5, image 111). A1 otherwise segments patent bilaterally. Grossly normal anterior communicating artery complex. Suspected multifocal moderate left A2 and A3 stenoses (series 1057, image 17). Right ACA patent to its distal aspect without appreciable stenosis. Right M1 widely patent proximally. There is a focal severe distal right M1 stenosis just prior to the bifurcation (series 1057, image 9). Right MCA branches perfused distally, although demonstrate diffuse small vessel atheromatous irregularity. Left M1 patent without stenosis. Left MCA branches well perfused, although demonstrate diffuse small vessel atheromatous irregularity. POSTERIOR CIRCULATION: Left vertebral artery dominant and patent to the vertebrobasilar junction without stenosis. Right vertebral artery somewhat diminutive and irregular but is grossly patent as well. Neither PICA well visualized. Basilar patent to  its distal aspect without stenosis. Superior cerebral arteries patent bilaterally. Both PCAs primarily supplied via the basilar. Moderate to severe stenoses noting the mid left P2 segment (series 1045, image 6). Probable short-segment moderate distal right P2 stenosis (series 1045, image 17). Both PCAs are perfused distally, although demonstrate distal small vessel atheromatous irregularity. MRA NECK FINDINGS Examination severely limited by extensive motion artifact, lack of IV contrast, and patient's inability to tolerate the full length of the exam. Visualized aortic arch grossly within normal limits for caliber. Normal branching pattern seen about the arch. No obvious flow-limiting stenosis about the origin the great vessels, although evaluation fairly limited. Right CCA grossly patent to the bifurcation without stenosis. Atheromatous irregularity with suspected at least mild approximate 30% stenosis at the origin of the right ICA  (series 7, image 9). Right ICA otherwise patent distally to the skull base without stenosis or occlusion. Partially visualized left CCA patent to the bifurcation. No obvious flow-limiting stenosis about the left bifurcation (series 7, image 19). Left ICA patent distally to the skull base without stenosis or occlusion. Neither origin of either vertebral artery is well visualized. The dominant left vertebral artery appears grossly patent within the neck (series 7, image 17). The diminutive right vertebral artery is not well visualized, and could be possibly occluded, although it appears to be grossly patent at the skull base on corresponding MRA of the head. Evaluation for possible vertebral stenosis or other pathology limited on this exam. IMPRESSION: MRI HEAD IMPRESSION: 1. Technically limited exam due to motion artifact and patient's inability to tolerate the full length of the exam. 2. Patchy multifocal small volume acute ischemic infarcts involving the cortical and subcortical right parieto-occipital region and right insula. No associated hemorrhage or mass effect. 3. Underlying age-related cerebral atrophy with moderate to severe chronic microvascular ischemic disease, with multiple remote lacunar infarcts involving the bilateral basal ganglia, thalami, and pons. MRA HEAD IMPRESSION: 1. Technically limited exam due to extensive motion artifact. 2. No large vessel occlusion. 3. Severe distal right M1 stenosis, with additional moderate to severe atheromatous stenoses involving the left ACA and PCAs as above. Distal small vessel atheromatous irregularity. 4. Question 3 mm focal outpouching extending superiorly from the right ICA terminus at the origin of the right A1, which could reflect focal vessel tortuosity or possibly small aneurysm. Difficult to be certain on this motion degraded exam. Attention at follow-up recommended. MRA NECK IMPRESSION: 1. Severely limited exam due to extensive motion artifact, lack of  IV contrast, and patient's inability to tolerate the full length of the exam. 2. Approximate 30% atheromatous stenosis at the right bifurcation/proximal right ICA. 3. Gross patency of the left carotid artery system, with no appreciable hemodynamically significant stenosis. 4. Gross patency of the dominant left vertebral artery. Right vertebral artery not visualized, and may be occluded, although appears grossly patent at the skull base on corresponding MRA of the head. Electronically Signed   By: Jeannine Boga M.D.   On: 12/05/2019 21:43   MR BRAIN WO CONTRAST  Result Date: 12/05/2019 CLINICAL DATA:  Initial evaluation for acute TIA. Speech abnormality with left arm numbness. EXAM: MRI HEAD WITHOUT CONTRAST MRA HEAD WITHOUT CONTRAST MRA NECK WITHOUT CONTRAST TECHNIQUE: Multiplanar, multiecho pulse sequences of the brain and surrounding structures were obtained without intravenous contrast. Angiographic images of the Circle of Willis were obtained using MRA technique without intravenous contrast. Angiographic images of the neck were obtained using MRA technique without intravenous contrast. Carotid stenosis measurements (when applicable) are obtained  utilizing NASCET criteria, using the distal internal carotid diameter as the denominator. COMPARISON:  Prior CT from earlier the same day. FINDINGS: MRI HEAD FINDINGS Brain: Examination technically limited by motion artifact and patient's inability to tolerate the full length of the exam. Generalized age-related cerebral atrophy. Patchy and confluent T2/FLAIR hyperintensity within the periventricular deep white matter both cerebral hemispheres most consistent with chronic small vessel ischemic disease, moderate to severe nature. Multiple remote lacunar infarcts present about the bilateral basal ganglia, thalami, and pons. Few remote lacunar infarcts noted involving the body of the corpus callosum and hemispheric cerebral white matter as well. Patchy multifocal  ischemic infarcts seen involving the cortical and subcortical aspect of the right parieto-occipital region (series 5, image 84). Additional 7 mm acute ischemic infarcts seen involving the right insula/subinsular white matter (series 5, image 77). No associated hemorrhage or mass effect. No other diffusion abnormality to suggest acute or subacute ischemia. Gray-white matter differentiation otherwise maintained. No other areas of encephalomalacia to suggest chronic cortical infarction. No evidence for acute intracranial hemorrhage. No mass lesion, midline shift or mass effect. No hydrocephalus or extra-axial fluid collection. Pituitary gland suprasellar region within normal limits. Midline structures intact. Vascular: Major intracranial vascular flow voids are maintained. Skull and upper cervical spine: Craniocervical junction within normal limits. Bone marrow signal intensity normal. No scalp soft tissue abnormality. Sinuses/Orbits: Patient status post bilateral ocular lens replacement. Moderate mucosal thickening noted within the right greater than left maxillary sinuses, chronic in appearance. Left sphenoid sinus retention cyst noted as well. Additional scattered mucosal thickening noted within the ethmoidal air cells. Trace right mastoid effusion noted, of doubtful significance. Other: None. MRA HEAD FINDINGS ANTERIOR CIRCULATION: Examination degraded by motion artifact. Visualized distal cervical segments of the internal carotid arteries are patent with antegrade flow. Petrous, cavernous, and supraclinoid ICAs patent without appreciable flow-limiting stenosis or other abnormality. There is an apparent 3 mm focal outpouching extending superiorly from the right ICA terminus at the origin of the right A1, which could reflect focal vessel tortuosity or possibly small aneurysm, difficult to be certain on this motion degraded exam (series 5, image 111). A1 otherwise segments patent bilaterally. Grossly normal anterior  communicating artery complex. Suspected multifocal moderate left A2 and A3 stenoses (series 1057, image 17). Right ACA patent to its distal aspect without appreciable stenosis. Right M1 widely patent proximally. There is a focal severe distal right M1 stenosis just prior to the bifurcation (series 1057, image 9). Right MCA branches perfused distally, although demonstrate diffuse small vessel atheromatous irregularity. Left M1 patent without stenosis. Left MCA branches well perfused, although demonstrate diffuse small vessel atheromatous irregularity. POSTERIOR CIRCULATION: Left vertebral artery dominant and patent to the vertebrobasilar junction without stenosis. Right vertebral artery somewhat diminutive and irregular but is grossly patent as well. Neither PICA well visualized. Basilar patent to its distal aspect without stenosis. Superior cerebral arteries patent bilaterally. Both PCAs primarily supplied via the basilar. Moderate to severe stenoses noting the mid left P2 segment (series 1045, image 6). Probable short-segment moderate distal right P2 stenosis (series 1045, image 17). Both PCAs are perfused distally, although demonstrate distal small vessel atheromatous irregularity. MRA NECK FINDINGS Examination severely limited by extensive motion artifact, lack of IV contrast, and patient's inability to tolerate the full length of the exam. Visualized aortic arch grossly within normal limits for caliber. Normal branching pattern seen about the arch. No obvious flow-limiting stenosis about the origin the great vessels, although evaluation fairly limited. Right CCA grossly patent to the  bifurcation without stenosis. Atheromatous irregularity with suspected at least mild approximate 30% stenosis at the origin of the right ICA (series 7, image 9). Right ICA otherwise patent distally to the skull base without stenosis or occlusion. Partially visualized left CCA patent to the bifurcation. No obvious flow-limiting  stenosis about the left bifurcation (series 7, image 19). Left ICA patent distally to the skull base without stenosis or occlusion. Neither origin of either vertebral artery is well visualized. The dominant left vertebral artery appears grossly patent within the neck (series 7, image 17). The diminutive right vertebral artery is not well visualized, and could be possibly occluded, although it appears to be grossly patent at the skull base on corresponding MRA of the head. Evaluation for possible vertebral stenosis or other pathology limited on this exam. IMPRESSION: MRI HEAD IMPRESSION: 1. Technically limited exam due to motion artifact and patient's inability to tolerate the full length of the exam. 2. Patchy multifocal small volume acute ischemic infarcts involving the cortical and subcortical right parieto-occipital region and right insula. No associated hemorrhage or mass effect. 3. Underlying age-related cerebral atrophy with moderate to severe chronic microvascular ischemic disease, with multiple remote lacunar infarcts involving the bilateral basal ganglia, thalami, and pons. MRA HEAD IMPRESSION: 1. Technically limited exam due to extensive motion artifact. 2. No large vessel occlusion. 3. Severe distal right M1 stenosis, with additional moderate to severe atheromatous stenoses involving the left ACA and PCAs as above. Distal small vessel atheromatous irregularity. 4. Question 3 mm focal outpouching extending superiorly from the right ICA terminus at the origin of the right A1, which could reflect focal vessel tortuosity or possibly small aneurysm. Difficult to be certain on this motion degraded exam. Attention at follow-up recommended. MRA NECK IMPRESSION: 1. Severely limited exam due to extensive motion artifact, lack of IV contrast, and patient's inability to tolerate the full length of the exam. 2. Approximate 30% atheromatous stenosis at the right bifurcation/proximal right ICA. 3. Gross patency of the  left carotid artery system, with no appreciable hemodynamically significant stenosis. 4. Gross patency of the dominant left vertebral artery. Right vertebral artery not visualized, and may be occluded, although appears grossly patent at the skull base on corresponding MRA of the head. Electronically Signed   By: Jeannine Boga M.D.   On: 12/05/2019 21:43   ECHOCARDIOGRAM COMPLETE  Result Date: 12/06/2019    ECHOCARDIOGRAM REPORT   Patient Name:   Briana Garrison Date of Exam: 12/06/2019 Medical Rec #:  [id number]       Height:       61.0 in Accession #:    [id number]      Weight:       186.0 lb Date of Birth:  1950-11-20       BSA:          1.831 m Patient Age:    64 years        BP:           143/61 mmHg Patient Gender: F               HR:           69 bpm. Exam Location:  Inpatient Procedure: 2D Echo Indications:    Stroke I163.9  History:        Patient has prior history of Echocardiogram examinations, most                 recent 06/22/2019. Risk Factors:Hypertension and Diabetes.  Sonographer:  Mikki Santee RDCS (AE) Referring Phys: Dante  1. Left ventricular ejection fraction, by estimation, is 60 to 65%. The left ventricle has normal function. The left ventricle has no regional wall motion abnormalities. There is moderate concentric left ventricular hypertrophy. Left ventricular diastolic parameters are consistent with Grade I diastolic dysfunction (impaired relaxation). Elevated left atrial pressure.  2. Right ventricular systolic function is normal. The right ventricular size is normal. Tricuspid regurgitation signal is inadequate for assessing PA pressure.  3. The mitral valve is normal in structure. No evidence of mitral valve regurgitation. No evidence of mitral stenosis.  4. The aortic valve is normal in structure. Aortic valve regurgitation is not visualized. No aortic stenosis is present.  5. The inferior vena cava is normal in size with greater than 50%  respiratory variability, suggesting right atrial pressure of 3 mmHg. Comparison(s): No significant change from prior study. Prior images reviewed side by side. FINDINGS  Left Ventricle: Left ventricular ejection fraction, by estimation, is 60 to 65%. The left ventricle has normal function. The left ventricle has no regional wall motion abnormalities. The left ventricular internal cavity size was normal in size. There is  moderate concentric left ventricular hypertrophy. Left ventricular diastolic parameters are consistent with Grade I diastolic dysfunction (impaired relaxation). Elevated left atrial pressure. Right Ventricle: The right ventricular size is normal. No increase in right ventricular wall thickness. Right ventricular systolic function is normal. Tricuspid regurgitation signal is inadequate for assessing PA pressure. Left Atrium: Left atrial size was normal in size. Right Atrium: Right atrial size was normal in size. Pericardium: There is no evidence of pericardial effusion. Mitral Valve: The mitral valve is normal in structure. No evidence of mitral valve regurgitation. No evidence of mitral valve stenosis. Tricuspid Valve: The tricuspid valve is normal in structure. Tricuspid valve regurgitation is not demonstrated. No evidence of tricuspid stenosis. Aortic Valve: The aortic valve is normal in structure. Aortic valve regurgitation is not visualized. No aortic stenosis is present. Pulmonic Valve: The pulmonic valve was normal in structure. Pulmonic valve regurgitation is not visualized. No evidence of pulmonic stenosis. Aorta: The aortic root is normal in size and structure. Venous: The inferior vena cava is normal in size with greater than 50% respiratory variability, suggesting right atrial pressure of 3 mmHg. IAS/Shunts: No atrial level shunt detected by color flow Doppler.  LEFT VENTRICLE PLAX 2D LVIDd:         3.80 cm  Diastology LVIDs:         2.40 cm  LV e' medial:    3.75 cm/s LV PW:         1.60  cm  LV E/e' medial:  24.7 LV IVS:        1.50 cm  LV e' lateral:   3.68 cm/s LVOT diam:     2.10 cm  LV E/e' lateral: 25.2 LV SV:         66 LV SV Index:   36 LVOT Area:     3.46 cm  RIGHT VENTRICLE RV S prime:     10.30 cm/s TAPSE (M-mode): 1.7 cm LEFT ATRIUM             Index       RIGHT ATRIUM          Index LA diam:        3.50 cm 1.91 cm/m  RA Area:     8.51 cm LA Vol (A2C):   40.9 ml 22.33 ml/m RA  Volume:   15.70 ml 8.57 ml/m LA Vol (A4C):   41.9 ml 22.88 ml/m LA Biplane Vol: 42.6 ml 23.26 ml/m  AORTIC VALVE LVOT Vmax:   65.90 cm/s LVOT Vmean:  52.400 cm/s LVOT VTI:    0.191 m  AORTA Ao Root diam: 3.00 cm MITRAL VALVE MV Area (PHT): 3.17 cm     SHUNTS MV Decel Time: 239 msec     Systemic VTI:  0.19 m MV E velocity: 92.70 cm/s   Systemic Diam: 2.10 cm MV A velocity: 142.00 cm/s MV E/A ratio:  0.65 Mihai Croitoru MD Electronically signed by Sanda Klein MD Signature Date/Time: 12/06/2019/1:02:09 PM    Final    CT HEAD CODE STROKE WO CONTRAST  Result Date: 12/05/2019 CLINICAL DATA:  Code stroke. 69 year old female with abnormal speech and left arm numbness. EXAM: CT HEAD WITHOUT CONTRAST TECHNIQUE: Contiguous axial images were obtained from the base of the skull through the vertex without intravenous contrast. COMPARISON:  Brain MRI 06/21/2019. FINDINGS: Brain: Numerous chronic lacunar infarcts in the bilateral deep gray matter nuclei. Chronic lacunar infarcts in the brainstem. Associated Patchy and confluent bilateral cerebral white matter hypodensity. No midline shift, ventriculomegaly, mass effect, evidence of mass lesion, intracranial hemorrhage or evidence of cortically based acute infarction. No definite cortical encephalomalacia. Vascular: Calcified atherosclerosis at the skull base. No suspicious intracranial vascular hyperdensity. Skull: No acute osseous abnormality identified. Sinuses/Orbits: Chronic right maxillary sinusitis. Stable left sphenoid sinus retention cysts. Tympanic cavities  and mastoids remain well aerated. Other: No acute orbit or scalp soft tissue finding. ASPECTS [facility] Stroke Program Early CT Score) Total score (0-10 with 10 being normal): 10 IMPRESSION: 1. Severe chronic small-vessel ischemia. No acute cortically based infarct or acute intracranial hemorrhage identified. ASPECTS 10. 2. These results were communicated to Dr. Bartholome Bill at 7:48 pm on 12/05/2019 by text page via the [facility] messaging system. 3. Chronic right maxillary sinusitis. Electronically Signed   By: Genevie Ann M.D.   On: 12/05/2019 19:49   VAS US CAROTID (at [facility] and [facility] only)  Result Date: 12/06/2019 Carotid Arterial Duplex Study Indications:       TIA. Risk Factors:      Hypertension, Diabetes. Comparison Study:  06/21/2019 - 1-39% ICA stenosis bilaterally. Performing Technologist: Oliver Hum RVT  Examination Guidelines: A complete evaluation includes B-mode imaging, spectral Doppler, color Doppler, and power Doppler as needed of all accessible portions of each vessel. Bilateral testing is considered an integral part of a complete examination. Limited examinations for reoccurring indications may be performed as noted.  Right Carotid Findings: +----------+--------+--------+--------+-----------------------+--------+           PSV cm/sEDV cm/sStenosisPlaque Description     Comments +----------+--------+--------+--------+-----------------------+--------+ CCA Prox  82      15              smooth and heterogenoustortuous +----------+--------+--------+--------+-----------------------+--------+ CCA Distal52      13              smooth and heterogenous         +----------+--------+--------+--------+-----------------------+--------+ ICA Prox  62      17              smooth and heterogenous         +----------+--------+--------+--------+-----------------------+--------+ ICA Distal66      24  tortuous  +----------+--------+--------+--------+-----------------------+--------+ ECA       57      6                                               +----------+--------+--------+--------+-----------------------+--------+ +----------+--------+-------+--------+-------------------+           PSV cm/sEDV cmsDescribeArm Pressure (mmHG) +----------+--------+-------+--------+-------------------+ Subclavian185                                        +----------+--------+-------+--------+-------------------+ +---------+--------+--+--------+-+---------+ VertebralPSV cm/s13EDV cm/s5Antegrade +---------+--------+--+--------+-+---------+  Left Carotid Findings: +----------+--------+--------+--------+-----------------------+--------+           PSV cm/sEDV cm/sStenosisPlaque Description     Comments +----------+--------+--------+--------+-----------------------+--------+ CCA Prox  87      14              smooth and heterogenoustortuous +----------+--------+--------+--------+-----------------------+--------+ CCA Distal62      18              smooth and heterogenous         +----------+--------+--------+--------+-----------------------+--------+ ICA Prox  47      16              smooth and heterogenous         +----------+--------+--------+--------+-----------------------+--------+ ICA Distal59      23                                     tortuous +----------+--------+--------+--------+-----------------------+--------+ ECA       55      6                                               +----------+--------+--------+--------+-----------------------+--------+ +----------+--------+--------+--------+-------------------+           PSV cm/sEDV cm/sDescribeArm Pressure (mmHG) +----------+--------+--------+--------+-------------------+ [id number]                                         +----------+--------+--------+--------+-------------------+  +---------+--------+--+--------+--+---------+ VertebralPSV cm/s60EDV cm/s12Antegrade +---------+--------+--+--------+--+---------+   Summary: Right Carotid: Velocities in the right ICA are consistent with a 1-39% stenosis. Left Carotid: Velocities in the left ICA are consistent with a 1-39% stenosis. Vertebrals: Bilateral vertebral arteries demonstrate antegrade flow. *See table(s) above for measurements and observations.  Electronically signed by Antony Contras MD on 12/06/2019 at 1:17:52 PM.    Final      Time coordinating discharge: Over 24 minutes    Dwyane Dee, MD  [facility] 12/08/2019, 4:49 PM

## 2019-12-08 NOTE — Hospital Course (Addendum)
Briana Garrison is a 69 year old female with history of  stroke with left-sided weakness in May 2021 presented to the emergency department with slurring of speech which resolved.   Patient was admitted for CVA, TIA work-up.  CT shows no acute abnormality.  MRI brain shows acute CVA involving the right parieto-occipital and right insular region.  MRA of the brain and neck does not show any large vessel obstruction.  Neurology was consulted patient seemed not a candidate for TPA.  Patient is admitted for a stroke work-up. She was recommended to continue on dual antiplatelet therapy with aspirin and Plavix for 3 months then followed by Plavix alone. She was evaluated by physical therapy during hospitalization and recommended for outpatient PT at discharge. Of note, her d/c paperwork stated asa for 21 days and long term Plavix. I called and spoke with the patient at 4:45 pm on 11/11 and informed her the recommendation was actually for asa and plavix for 3 months then to continue on plavix long term after that. She read back the recommendations properly and correctly to me. She also states she has appts made with her PCP, PT, and plans to call neurology for a follow up as well.

## 2019-12-14 DIAGNOSIS — E1165 Type 2 diabetes mellitus with hyperglycemia: Secondary | ICD-10-CM | POA: Diagnosis not present

## 2019-12-14 DIAGNOSIS — I10 Essential (primary) hypertension: Secondary | ICD-10-CM | POA: Diagnosis not present

## 2019-12-14 DIAGNOSIS — Z8673 Personal history of transient ischemic attack (TIA), and cerebral infarction without residual deficits: Secondary | ICD-10-CM | POA: Diagnosis not present

## 2019-12-14 DIAGNOSIS — E7800 Pure hypercholesterolemia, unspecified: Secondary | ICD-10-CM | POA: Diagnosis not present

## 2019-12-14 DIAGNOSIS — Z09 Encounter for follow-up examination after completed treatment for conditions other than malignant neoplasm: Secondary | ICD-10-CM | POA: Diagnosis not present

## 2019-12-14 DIAGNOSIS — N184 Chronic kidney disease, stage 4 (severe): Secondary | ICD-10-CM | POA: Diagnosis not present

## 2019-12-15 DIAGNOSIS — E1165 Type 2 diabetes mellitus with hyperglycemia: Secondary | ICD-10-CM | POA: Diagnosis not present

## 2019-12-15 DIAGNOSIS — E1140 Type 2 diabetes mellitus with diabetic neuropathy, unspecified: Secondary | ICD-10-CM | POA: Diagnosis not present

## 2019-12-16 DIAGNOSIS — I10 Essential (primary) hypertension: Secondary | ICD-10-CM | POA: Diagnosis not present

## 2019-12-16 DIAGNOSIS — E1140 Type 2 diabetes mellitus with diabetic neuropathy, unspecified: Secondary | ICD-10-CM | POA: Diagnosis not present

## 2019-12-16 DIAGNOSIS — Z79899 Other long term (current) drug therapy: Secondary | ICD-10-CM | POA: Diagnosis not present

## 2019-12-16 DIAGNOSIS — Z8673 Personal history of transient ischemic attack (TIA), and cerebral infarction without residual deficits: Secondary | ICD-10-CM | POA: Diagnosis not present

## 2019-12-19 ENCOUNTER — Telehealth: Payer: Self-pay | Admitting: Neurology

## 2019-12-19 NOTE — Telephone Encounter (Signed)
Pt called, have had another stroke. Wanted to discuss if Dr. Leonie Man has any days in December I could be worked in?

## 2019-12-27 DIAGNOSIS — Z8639 Personal history of other endocrine, nutritional and metabolic disease: Secondary | ICD-10-CM | POA: Diagnosis not present

## 2019-12-27 DIAGNOSIS — E1165 Type 2 diabetes mellitus with hyperglycemia: Secondary | ICD-10-CM | POA: Diagnosis not present

## 2019-12-27 DIAGNOSIS — N184 Chronic kidney disease, stage 4 (severe): Secondary | ICD-10-CM | POA: Diagnosis not present

## 2019-12-27 DIAGNOSIS — E1140 Type 2 diabetes mellitus with diabetic neuropathy, unspecified: Secondary | ICD-10-CM | POA: Diagnosis not present

## 2019-12-27 DIAGNOSIS — E7800 Pure hypercholesterolemia, unspecified: Secondary | ICD-10-CM | POA: Diagnosis not present

## 2019-12-27 DIAGNOSIS — I10 Essential (primary) hypertension: Secondary | ICD-10-CM | POA: Diagnosis not present

## 2019-12-29 DIAGNOSIS — R531 Weakness: Secondary | ICD-10-CM | POA: Diagnosis not present

## 2019-12-29 DIAGNOSIS — Z723 Lack of physical exercise: Secondary | ICD-10-CM | POA: Diagnosis not present

## 2020-01-11 DIAGNOSIS — I888 Other nonspecific lymphadenitis: Secondary | ICD-10-CM | POA: Diagnosis not present

## 2020-01-16 ENCOUNTER — Emergency Department (HOSPITAL_COMMUNITY): Payer: Medicare PPO

## 2020-01-16 ENCOUNTER — Other Ambulatory Visit: Payer: Self-pay

## 2020-01-16 ENCOUNTER — Observation Stay (HOSPITAL_COMMUNITY)
Admission: EM | Admit: 2020-01-16 | Discharge: 2020-01-17 | Disposition: A | Discharge status: Home / Self Care | Payer: Medicare PPO | Attending: Internal Medicine | Admitting: Internal Medicine

## 2020-01-16 ENCOUNTER — Observation Stay (HOSPITAL_COMMUNITY): Payer: Medicare PPO

## 2020-01-16 ENCOUNTER — Encounter (HOSPITAL_COMMUNITY): Payer: Self-pay | Admitting: Internal Medicine

## 2020-01-16 DIAGNOSIS — I639 Cerebral infarction, unspecified: Secondary | ICD-10-CM | POA: Diagnosis not present

## 2020-01-16 DIAGNOSIS — I6782 Cerebral ischemia: Secondary | ICD-10-CM | POA: Diagnosis not present

## 2020-01-16 DIAGNOSIS — E1122 Type 2 diabetes mellitus with diabetic chronic kidney disease: Secondary | ICD-10-CM | POA: Diagnosis not present

## 2020-01-16 DIAGNOSIS — Z7902 Long term (current) use of antithrombotics/antiplatelets: Secondary | ICD-10-CM | POA: Insufficient documentation

## 2020-01-16 DIAGNOSIS — R29810 Facial weakness: Secondary | ICD-10-CM

## 2020-01-16 DIAGNOSIS — Z79899 Other long term (current) drug therapy: Secondary | ICD-10-CM | POA: Diagnosis not present

## 2020-01-16 DIAGNOSIS — E1165 Type 2 diabetes mellitus with hyperglycemia: Secondary | ICD-10-CM | POA: Diagnosis not present

## 2020-01-16 DIAGNOSIS — Z8673 Personal history of transient ischemic attack (TIA), and cerebral infarction without residual deficits: Secondary | ICD-10-CM | POA: Insufficient documentation

## 2020-01-16 DIAGNOSIS — R4781 Slurred speech: Principal | ICD-10-CM | POA: Insufficient documentation

## 2020-01-16 DIAGNOSIS — I6623 Occlusion and stenosis of bilateral posterior cerebral arteries: Secondary | ICD-10-CM | POA: Diagnosis not present

## 2020-01-16 DIAGNOSIS — I129 Hypertensive chronic kidney disease with stage 1 through stage 4 chronic kidney disease, or unspecified chronic kidney disease: Secondary | ICD-10-CM | POA: Insufficient documentation

## 2020-01-16 DIAGNOSIS — I63513 Cerebral infarction due to unspecified occlusion or stenosis of bilateral middle cerebral arteries: Secondary | ICD-10-CM | POA: Diagnosis not present

## 2020-01-16 DIAGNOSIS — I6612 Occlusion and stenosis of left anterior cerebral artery: Secondary | ICD-10-CM | POA: Diagnosis not present

## 2020-01-16 DIAGNOSIS — N184 Chronic kidney disease, stage 4 (severe): Secondary | ICD-10-CM | POA: Insufficient documentation

## 2020-01-16 DIAGNOSIS — E1140 Type 2 diabetes mellitus with diabetic neuropathy, unspecified: Secondary | ICD-10-CM | POA: Diagnosis not present

## 2020-01-16 DIAGNOSIS — R471 Dysarthria and anarthria: Secondary | ICD-10-CM | POA: Diagnosis not present

## 2020-01-16 DIAGNOSIS — I10 Essential (primary) hypertension: Secondary | ICD-10-CM | POA: Diagnosis not present

## 2020-01-16 DIAGNOSIS — E1142 Type 2 diabetes mellitus with diabetic polyneuropathy: Secondary | ICD-10-CM | POA: Insufficient documentation

## 2020-01-16 DIAGNOSIS — Z794 Long term (current) use of insulin: Secondary | ICD-10-CM | POA: Diagnosis not present

## 2020-01-16 DIAGNOSIS — Z20822 Contact with and (suspected) exposure to covid-19: Secondary | ICD-10-CM | POA: Insufficient documentation

## 2020-01-16 DIAGNOSIS — G459 Transient cerebral ischemic attack, unspecified: Secondary | ICD-10-CM | POA: Diagnosis not present

## 2020-01-16 LAB — COMPREHENSIVE METABOLIC PANEL
ALT: 38 U/L (ref 0–44)
AST: 24 U/L (ref 15–41)
Albumin: 3.9 g/dL (ref 3.5–5.0)
Alkaline Phosphatase: 82 U/L (ref 38–126)
Anion gap: 8 (ref 5–15)
BUN: 39 mg/dL — ABNORMAL HIGH (ref 8–23)
CO2: 23 mmol/L (ref 22–32)
Calcium: 9.6 mg/dL (ref 8.9–10.3)
Chloride: 106 mmol/L (ref 98–111)
Creatinine, Ser: 2.1 mg/dL — ABNORMAL HIGH (ref 0.44–1.00)
GFR, Estimated: 25 mL/min — ABNORMAL LOW (ref >60)
Glucose, Bld: 210 mg/dL — ABNORMAL HIGH (ref 70–99)
Potassium: 4.2 mmol/L (ref 3.5–5.1)
Sodium: 137 mmol/L (ref 135–145)
Total Bilirubin: 0.2 mg/dL — ABNORMAL LOW (ref 0.3–1.2)
Total Protein: 7.4 g/dL (ref 6.5–8.1)

## 2020-01-16 LAB — DIFFERENTIAL
Abs Immature Granulocytes: 0.02 10*3/uL (ref 0.00–0.07)
Basophils Absolute: 0 10*3/uL (ref 0.0–0.1)
Basophils Relative: 1 %
Eosinophils Absolute: 0.2 10*3/uL (ref 0.0–0.5)
Eosinophils Relative: 3 %
Immature Granulocytes: 0 %
Lymphocytes Relative: 26 %
Lymphs Abs: 1.7 10*3/uL (ref 0.7–4.0)
Monocytes Absolute: 0.7 10*3/uL (ref 0.1–1.0)
Monocytes Relative: 11 %
Neutro Abs: 3.8 10*3/uL (ref 1.7–7.7)
Neutrophils Relative %: 59 %

## 2020-01-16 LAB — PROTIME-INR
INR: 1 (ref 0.8–1.2)
Prothrombin Time: 12.8 seconds (ref 11.4–15.2)

## 2020-01-16 LAB — CBC
HCT: 35.1 % — ABNORMAL LOW (ref 36.0–46.0)
Hemoglobin: 11.4 g/dL — ABNORMAL LOW (ref 12.0–15.0)
MCH: 28.9 pg (ref 26.0–34.0)
MCHC: 32.5 g/dL (ref 30.0–36.0)
MCV: 88.9 fL (ref 80.0–100.0)
Platelets: 261 10*3/uL (ref 150–400)
RBC: 3.95 MIL/uL (ref 3.87–5.11)
RDW: 13.4 % (ref 11.5–15.5)
WBC: 6.4 10*3/uL (ref 4.0–10.5)
nRBC: 0 % (ref 0.0–0.2)

## 2020-01-16 LAB — RESP PANEL BY RT-PCR (FLU A&B, COVID) ARPGX2
Influenza A by PCR: NEGATIVE
Influenza B by PCR: NEGATIVE
SARS Coronavirus 2 by RT PCR: NEGATIVE

## 2020-01-16 LAB — APTT: aPTT: 34 seconds (ref 24–36)

## 2020-01-16 LAB — GLUCOSE, CAPILLARY
Glucose-Capillary: 133 mg/dL — ABNORMAL HIGH (ref 70–99)
Glucose-Capillary: 225 mg/dL — ABNORMAL HIGH (ref 70–99)

## 2020-01-16 LAB — CBG MONITORING, ED: Glucose-Capillary: 175 mg/dL — ABNORMAL HIGH (ref 70–99)

## 2020-01-16 LAB — ETHANOL: Alcohol, Ethyl (B): <10 mg/dL (ref <10)

## 2020-01-16 MED ORDER — FUROSEMIDE 40 MG PO TABS
40.0000 mg | ORAL_TABLET | Freq: Two times a day (BID) | ORAL | Status: DC
  Administered 2020-01-16 – 2020-01-17 (×2): 40 mg via ORAL
  Filled 2020-01-16 (×2): qty 1

## 2020-01-16 MED ORDER — VITAMIN D 25 MCG (1000 UNIT) PO TABS
1000.0000 [IU] | ORAL_TABLET | Freq: Every day | ORAL | Status: DC
  Administered 2020-01-17: 10:00:00 1000 [IU] via ORAL
  Filled 2020-01-16: qty 1

## 2020-01-16 MED ORDER — LORATADINE 10 MG PO TABS
10.0000 mg | ORAL_TABLET | Freq: Every day | ORAL | Status: DC
  Administered 2020-01-17: 10:00:00 10 mg via ORAL
  Filled 2020-01-16: qty 1

## 2020-01-16 MED ORDER — ENOXAPARIN SODIUM 30 MG/0.3ML ~~LOC~~ SOLN
30.0000 mg | SUBCUTANEOUS | Status: DC
  Administered 2020-01-16: 21:00:00 30 mg via SUBCUTANEOUS
  Filled 2020-01-16: qty 0.3

## 2020-01-16 MED ORDER — ACETAMINOPHEN 160 MG/5ML PO SOLN: 650.0000 mg | ORAL | Status: DC | PRN

## 2020-01-16 MED ORDER — SODIUM CHLORIDE 0.9 % IV SOLN: INTRAVENOUS | Status: DC

## 2020-01-16 MED ORDER — HYDRALAZINE HCL 20 MG/ML IJ SOLN: 10.0000 mg | Freq: Four times a day (QID) | INTRAMUSCULAR | Status: DC | PRN

## 2020-01-16 MED ORDER — ACETAMINOPHEN 650 MG RE SUPP: 650.0000 mg | RECTAL | Status: DC | PRN

## 2020-01-16 MED ORDER — INSULIN GLARGINE 100 UNIT/ML ~~LOC~~ SOLN
10.0000 [IU] | Freq: Every day | SUBCUTANEOUS | Status: DC
  Administered 2020-01-16: 21:00:00 10 [IU] via SUBCUTANEOUS
  Filled 2020-01-16 (×2): qty 0.1

## 2020-01-16 MED ORDER — ACETAMINOPHEN 325 MG PO TABS: 650.0000 mg | ORAL_TABLET | ORAL | Status: DC | PRN

## 2020-01-16 MED ORDER — ASPIRIN EC 81 MG PO TBEC: 81.0000 mg | DELAYED_RELEASE_TABLET | Freq: Every day | ORAL | Status: DC

## 2020-01-16 MED ORDER — VITAMIN B-12 1000 MCG PO TABS
1000.0000 ug | ORAL_TABLET | Freq: Every day | ORAL | Status: DC
  Administered 2020-01-17: 10:00:00 1000 ug via ORAL
  Filled 2020-01-16: qty 1

## 2020-01-16 MED ORDER — CLOPIDOGREL BISULFATE 75 MG PO TABS
75.0000 mg | ORAL_TABLET | Freq: Every day | ORAL | Status: DC
Start: 2020-01-17 — End: 2020-01-17
  Administered 2020-01-17: 10:00:00 75 mg via ORAL
  Filled 2020-01-16: qty 1

## 2020-01-16 MED ORDER — INSULIN ASPART 100 UNIT/ML ~~LOC~~ SOLN
0.0000 [IU] | Freq: Three times a day (TID) | SUBCUTANEOUS | Status: DC
  Administered 2020-01-16: 17:00:00 2 [IU] via SUBCUTANEOUS
  Administered 2020-01-17: 08:00:00 3 [IU] via SUBCUTANEOUS

## 2020-01-16 MED ORDER — SENNOSIDES-DOCUSATE SODIUM 8.6-50 MG PO TABS
1.0000 | ORAL_TABLET | Freq: Every evening | ORAL | Status: DC | PRN
  Administered 2020-01-16: 17:00:00 1 via ORAL
  Filled 2020-01-16: qty 1

## 2020-01-16 MED ORDER — INSULIN ASPART 100 UNIT/ML ~~LOC~~ SOLN
0.0000 [IU] | Freq: Every day | SUBCUTANEOUS | Status: DC
  Administered 2020-01-16: 22:00:00 2 [IU] via SUBCUTANEOUS

## 2020-01-16 MED ORDER — GABAPENTIN 100 MG PO CAPS
100.0000 mg | ORAL_CAPSULE | Freq: Every day | ORAL | Status: DC
  Administered 2020-01-16: 21:00:00 100 mg via ORAL
  Filled 2020-01-16: qty 1

## 2020-01-16 MED ORDER — ASPIRIN EC 325 MG PO TBEC
325.0000 mg | DELAYED_RELEASE_TABLET | Freq: Every day | ORAL | Status: DC
  Administered 2020-01-17: 10:00:00 325 mg via ORAL
  Filled 2020-01-16: qty 1

## 2020-01-16 MED ORDER — STROKE: EARLY STAGES OF RECOVERY BOOK
Freq: Once | Status: AC
  Administered 2020-01-16: 17:00:00 1

## 2020-01-16 NOTE — ED Provider Notes (Signed)
Emergency Department Provider Note   I have reviewed the triage vital signs and the nursing notes.   HISTORY  Chief Complaint Facial Droop   HPI Briana Garrison is a 69 y.o. female with past medical history reviewed below presents to the emergency department with acute onset right facial droop.  Patient was last normal at 10 PM yesterday evening prior to going to bed.  Upon waking this morning at around 6:30AM she states that shortly afterwards her daughter noticed that the right side of her face seemed to be drooping in comparison to the left.  She has had prior strokes x 2 but no lingering facial asymmetry.  She has been working with her physical therapist to improve strength.  She states that she woke up feeling fine with no vision deficits, speech difference, word finding difficulty, or difficulty swallowing.  Per EMS the patient ambulated to the stretcher and has had no complaints in route.  They did note elevated blood pressure in route but allowed her to be permissively hypertensive with stroke on the differential.   Past Medical History:  Diagnosis Date  . Cancer of kidney ([place]) 05/14/2009  . Diabetes mellitus   . Endometrial mass   . GERD (gastroesophageal reflux disease)   . Hypercholesterolemia   . Hypertension   . Internal hemorrhoids    S/P colonoscopy 06/10/00 by Dr. Lajoyce Corners  . Shingles     Patient Active Problem List   Diagnosis Date Noted  . Slurred speech 01/16/2020  . CKD (chronic kidney disease) stage 4, GFR 15-29 ml/min (HCC) 01/16/2020  . Uncontrolled type 2 diabetes mellitus with hyperglycemia ([place]) 01/16/2020  . CVA (cerebral vascular accident) ([place]) 12/05/2019  . Acute CVA (cerebrovascular accident) ([place]) 06/21/2019  . History of renal cell carcinoma 06/21/2019  . Left ventricular hypertrophy 11/26/2018  . CKD stage G3b/A3, GFR 30-44 and albumin creatinine ratio >300 mg/g ([place]) 11/26/2018  . Change in stool 08/03/2018  . Choledocholithiasis with  obstruction 02/07/2017  . AKI (acute kidney injury) ([place]) 02/07/2017  . Acute kidney injury superimposed on CKD ([place]) 06/23/2011  . Hypoglycemia associated with diabetes ([place]) 06/23/2011  . Type 2 diabetes mellitus with hyperlipidemia ([place]) 06/23/2011  . Essential hypertension 06/23/2011  . Hyperlipidemia 06/23/2011  . Normocytic anemia 06/23/2011  . UTI (lower urinary tract infection) 06/23/2011    Past Surgical History:  Procedure Laterality Date  . BIOPSY  09/06/2018   Procedure: BIOPSY;  Surgeon: Rogene Houston, MD;  Location: [facility];  Service: Endoscopy;;  . CHOLECYSTECTOMY  11/13/08  . COLONOSCOPY N/A 09/06/2018   Procedure: COLONOSCOPY;  Surgeon: Rogene Houston, MD;  Location: [facility];  Service: Endoscopy;  Laterality: N/A;  730  . CYSTOSCOPY KIDNEY W/ URETERAL GUIDE WIRE  05/14/09  . Diagnostic hysteroscopy, D&C, endometrial polypectomy  07/01/2005  . LIPECTOMY    . POLYPECTOMY  09/06/2018   Procedure: POLYPECTOMY;  Surgeon: Rogene Houston, MD;  Location: [facility];  Service: Endoscopy;;    Allergies Amlodipine and Tramadol  Family History  Problem Relation Age of Onset  . Ovarian cancer Mother   . Stroke Mother   . Stroke Father   . Stroke Brother   . Stroke Sister     Social History Social History   Tobacco Use  . Smoking status: Never Smoker  . Smokeless tobacco: Never Used  Vaping Use  . Vaping Use: Never used  Substance Use Topics  . Alcohol use: No  . Drug use: No  Review of Systems  Constitutional: No fever/chills Eyes: No visual changes. ENT: No sore throat. Cardiovascular: Denies chest pain. Respiratory: Denies shortness of breath. Gastrointestinal: No abdominal pain.  No nausea, no vomiting.  No diarrhea.  No constipation. Genitourinary: Negative for dysuria. Musculoskeletal: Negative for back pain. Skin: Negative for rash. Neurological: Negative for headaches or numbness. Positive right face droop.   10-point ROS  otherwise negative.  ____________________________________________   PHYSICAL EXAM:  VITAL SIGNS: Temp: 98.4 F Pulse: 59 RR: 17 BP: 156/82 SpO2: 99%  Constitutional: Alert and oriented. Well appearing and in no acute distress. Eyes: Conjunctivae are normal. PERRL. EOMI. Head: Atraumatic. Nose: No congestion/rhinnorhea. Mouth/Throat: Mucous membranes are moist.   Neck: No stridor.   Cardiovascular: Normal rate, regular rhythm. Good peripheral circulation. Grossly normal heart sounds.   Respiratory: Normal respiratory effort.  No retractions. Lungs CTAB. Gastrointestinal: Soft and nontender. No distention.  Musculoskeletal: No lower extremity tenderness nor edema. No gross deformities of extremities. Neurologic:  Normal speech and language. Right facial asymmetry and mild weakness. Normal sensation. Mild decreased strength bilaterally in the upper/lower extremities but overall equal.  Skin:  Skin is warm, dry and intact. No rash noted.   ____________________________________________   LABS (all labs ordered are listed, but only abnormal results are displayed)  Labs Reviewed  CBC - Abnormal; Notable for the following components:      Result Value   Hemoglobin 11.4 (*)    HCT 35.1 (*)    All other components within normal limits  COMPREHENSIVE METABOLIC PANEL - Abnormal; Notable for the following components:   Glucose, Bld 210 (*)    BUN 39 (*)    Creatinine, Ser 2.10 (*)    Total Bilirubin 0.2 (*)    GFR, Estimated 25 (*)    All other components within normal limits  GLUCOSE, CAPILLARY - Abnormal; Notable for the following components:   Glucose-Capillary 133 (*)    All other components within normal limits  LIPID PANEL - Abnormal; Notable for the following components:   Triglycerides 177 (*)    All other components within normal limits  BASIC METABOLIC PANEL - Abnormal; Notable for the following components:   Glucose, Bld 190 (*)    BUN 38 (*)    Creatinine, Ser 1.84  (*)    GFR, Estimated 29 (*)    All other components within normal limits  GLUCOSE, CAPILLARY - Abnormal; Notable for the following components:   Glucose-Capillary 225 (*)    All other components within normal limits  GLUCOSE, CAPILLARY - Abnormal; Notable for the following components:   Glucose-Capillary 175 (*)    All other components within normal limits  CBG MONITORING, ED - Abnormal; Notable for the following components:   Glucose-Capillary 175 (*)    All other components within normal limits  RESP PANEL BY RT-PCR (FLU A&B, COVID) ARPGX2  ETHANOL  PROTIME-INR  APTT  DIFFERENTIAL  MAGNESIUM  RAPID URINE DRUG SCREEN, HOSP PERFORMED  URINALYSIS, ROUTINE W REFLEX MICROSCOPIC  HEMOGLOBIN A1C   ____________________________________________  EKG   EKG Interpretation  Date/Time:  Monday January 16 2020 10:03:03 EST Ventricular Rate:  67 PR Interval:    QRS Duration: 85 QT Interval:  421 QTC Calculation: 445 R Axis:   42 Text Interpretation: Sinus rhythm Low voltage, precordial leads Consider anterior infarct Borderline T abnormalities, inferior leads Baseline wander in lead(s) V4 No STEMI Confirmed by Nanda Quinton [phone]) on 01/16/2020 10:13:41 AM       ____________________________________________  RADIOLOGY  Darletta Moll  Chest 2 View  Result Date: 01/16/2020 CLINICAL DATA:  Stroke EXAM: CHEST - 2 VIEW COMPARISON:  06/21/2019 FINDINGS: Chronic elevation of the right hemidiaphragm. No new consolidation or edema. No pleural effusion. Stable cardiomediastinal contours are within normal limits. No acute osseous abnormality. IMPRESSION: No acute process in the chest. Electronically Signed   By: Macy Mis M.D.   On: 01/16/2020 19:00   CT HEAD WO CONTRAST  Result Date: 01/16/2020 CLINICAL DATA:  Transient ischemic attack. Slurred speech and possible facial droop. EXAM: CT HEAD WITHOUT CONTRAST TECHNIQUE: Contiguous axial images were obtained from the base of the skull through the  vertex without intravenous contrast. COMPARISON:  CT head December 05, 2019.  MRI November 8, 21. FINDINGS: Brain: No evidence of acute large vascular territory infarction, hemorrhage, hydrocephalus, extra-axial collection or mass lesion/mass effect. Remote lacunar infarcts in bilateral thalami. Patchy white matter hypoattenuation, most likely related to chronic microvascular ischemic disease. Chronic lacunar infarct in the brainstem, which is not well visualized on this study. Vascular: Calcific atherosclerosis. No hyperdense vessel identified. Skull: No acute fracture. Sinuses/Orbits: Partially imaged chronic right maxillary sinus opacification with maxillary sinus wall thickening. Retention cyst in the left sphenoid sinus. Other: No mastoid effusions. IMPRESSION: 1. No evidence of acute intracranial abnormality. 2. Remote lacunar infarcts in the brainstem and bilateral thalami. 3. Chronic microvascular ischemic disease. 4. Partially imaged findings suggestive of chronic right maxillary sinusitis. Electronically Signed   By: Margaretha Sheffield MD   On: 01/16/2020 13:17   MR ANGIO HEAD WO CONTRAST  Result Date: 01/16/2020 CLINICAL DATA:  Stroke, follow-up. Additional history provided: Altered mental status for 2 days. EXAM: MRI HEAD WITHOUT CONTRAST MRA HEAD WITHOUT CONTRAST TECHNIQUE: Multiplanar, multiecho pulse sequences of the brain and surrounding structures were obtained without intravenous contrast. Angiographic images of the head were obtained using MRA technique without contrast. COMPARISON:  MRI/MRA head 12/05/2019. FINDINGS: MRI HEAD FINDINGS Brain: Moderate cerebral atrophy.  Comparatively mild cerebellar atrophy. Residual diffusion weighted hyperintensity at site of known patchy late subacute cortical/subcortical infarcts within the cortical/subcortical right parietooccipital lobes (acute at time of prior brain MRI 12/05/2019. A known small subacute infarct within the right insula was better  appreciated on the prior MRI. No acute ischemic infarct is identified. Redemonstrated chronic lacunar infarcts within the corpus callosum bilateral basal ganglia, thalami, pons and right middle cerebellar peduncle. Stable background moderate/advanced multifocal T2/FLAIR hyperintensity within the cerebral white matter and pons which is nonspecific, but compatible with chronic small vessel ischemic disease. Redemonstrated chronic microhemorrhage within the medial left temporal lobe. No evidence of intracranial mass. No extra-axial fluid collection. No midline shift. Vascular: Reported below. Skull and upper cervical spine: No focal marrow lesion. Sinuses/Orbits: Visualized orbits show no acute finding. Paranasal sinus mucosal thickening. Most notably, there is moderate mucosal thickening within the right greater than left maxillary sinuses. Small left sphenoid sinus mucous retention cyst. MRA HEAD FINDINGS The intracranial internal carotid arteries are patent. Redemonstrated moderate/severe stenosis of the distal M1 right middle cerebral artery (series 1036, image 9). Redemonstrated moderate stenosis within the superior division proximal M2 right MCA branch (series 1036, image 4). Redemonstrated high-grade stenosis within the inferior division proximal M2 right MCA branch (series 1036, image 16). The M1 left middle cerebral artery is patent without significant stenosis. Redemonstrated severe focal stenosis within the superior division proximal to mid left M2 MCA branch (series 1036, image 4). Redemonstrated moderate/severe stenosis within an inferior division distal M2 left MCA branch (series 1036, image 4). The anterior cerebral  arteries are patent. Redemonstrated severe stenosis within the distal left A2 segment (series 1036, image 8). Unchanged from the prior exam, the intracranial right vertebral artery is non-dominant and irregular but patent to the basilar. The dominant left vertebral artery is patent without  significant stenosis. The basilar artery is patent. The posterior cerebral arteries are patent with atherosclerotic irregularity bilaterally. Most notably, there is a redemonstrated moderate stenosis within the proximal P2 left PCA, and sites of severe stenosis within the right PCA at the P2/P3 junction. Posterior communicating arteries are hypoplastic or absent bilaterally. No intracranial aneurysm is identified. IMPRESSION: MRI brain: 1. No evidence of acute intracranial abnormality. 2. Known small late subacute infarcts in the cortical/subcortical right parietooccipital lobes and right insula (acute at time of prior brain MRI 12/05/2019). 3. Redemonstrated chronic lacunar infarcts within corpus callosum, bilateral basal ganglia, thalami, pons and right middle cerebellar peduncle. 4. Stable background moderate/severe chronic small vessel ischemic disease within the cerebral white matter and pons. 5. Moderate cerebral atrophy. 6. Unchanged chronic paranasal sinus disease, most notably maxillary. MRA head: 1. No intracranial large vessel occlusion. 2. Stable, advanced intracranial atherosclerotic disease with multifocal stenoses, most notably as follows. 3. Moderate/severe stenosis within the distal M1 right MCA. 4. Sites of severe stenosis within bilateral proximal M2 MCA branches. 5. Moderate/severe stenosis within a distal M2 left MCA branch. 6. Severe stenosis within the distal A2 left ACA. 7. Moderate stenosis within the P2 left PCA. 8. Severe stenosis within the right PCA at the P2/P3 junction. Electronically Signed   By: Kellie Simmering DO   On: 01/16/2020 16:15   MR BRAIN WO CONTRAST  Result Date: 01/16/2020 CLINICAL DATA:  Stroke, follow-up. Additional history provided: Altered mental status for 2 days. EXAM: MRI HEAD WITHOUT CONTRAST MRA HEAD WITHOUT CONTRAST TECHNIQUE: Multiplanar, multiecho pulse sequences of the brain and surrounding structures were obtained without intravenous contrast. Angiographic  images of the head were obtained using MRA technique without contrast. COMPARISON:  MRI/MRA head 12/05/2019. FINDINGS: MRI HEAD FINDINGS Brain: Moderate cerebral atrophy.  Comparatively mild cerebellar atrophy. Residual diffusion weighted hyperintensity at site of known patchy late subacute cortical/subcortical infarcts within the cortical/subcortical right parietooccipital lobes (acute at time of prior brain MRI 12/05/2019. A known small subacute infarct within the right insula was better appreciated on the prior MRI. No acute ischemic infarct is identified. Redemonstrated chronic lacunar infarcts within the corpus callosum bilateral basal ganglia, thalami, pons and right middle cerebellar peduncle. Stable background moderate/advanced multifocal T2/FLAIR hyperintensity within the cerebral white matter and pons which is nonspecific, but compatible with chronic small vessel ischemic disease. Redemonstrated chronic microhemorrhage within the medial left temporal lobe. No evidence of intracranial mass. No extra-axial fluid collection. No midline shift. Vascular: Reported below. Skull and upper cervical spine: No focal marrow lesion. Sinuses/Orbits: Visualized orbits show no acute finding. Paranasal sinus mucosal thickening. Most notably, there is moderate mucosal thickening within the right greater than left maxillary sinuses. Small left sphenoid sinus mucous retention cyst. MRA HEAD FINDINGS The intracranial internal carotid arteries are patent. Redemonstrated moderate/severe stenosis of the distal M1 right middle cerebral artery (series 1036, image 9). Redemonstrated moderate stenosis within the superior division proximal M2 right MCA branch (series 1036, image 4). Redemonstrated high-grade stenosis within the inferior division proximal M2 right MCA branch (series 1036, image 16). The M1 left middle cerebral artery is patent without significant stenosis. Redemonstrated severe focal stenosis within the superior  division proximal to mid left M2 MCA branch (series 1036, image 4). Redemonstrated  moderate/severe stenosis within an inferior division distal M2 left MCA branch (series 1036, image 4). The anterior cerebral arteries are patent. Redemonstrated severe stenosis within the distal left A2 segment (series 1036, image 8). Unchanged from the prior exam, the intracranial right vertebral artery is non-dominant and irregular but patent to the basilar. The dominant left vertebral artery is patent without significant stenosis. The basilar artery is patent. The posterior cerebral arteries are patent with atherosclerotic irregularity bilaterally. Most notably, there is a redemonstrated moderate stenosis within the proximal P2 left PCA, and sites of severe stenosis within the right PCA at the P2/P3 junction. Posterior communicating arteries are hypoplastic or absent bilaterally. No intracranial aneurysm is identified. IMPRESSION: MRI brain: 1. No evidence of acute intracranial abnormality. 2. Known small late subacute infarcts in the cortical/subcortical right parietooccipital lobes and right insula (acute at time of prior brain MRI 12/05/2019). 3. Redemonstrated chronic lacunar infarcts within corpus callosum, bilateral basal ganglia, thalami, pons and right middle cerebellar peduncle. 4. Stable background moderate/severe chronic small vessel ischemic disease within the cerebral white matter and pons. 5. Moderate cerebral atrophy. 6. Unchanged chronic paranasal sinus disease, most notably maxillary. MRA head: 1. No intracranial large vessel occlusion. 2. Stable, advanced intracranial atherosclerotic disease with multifocal stenoses, most notably as follows. 3. Moderate/severe stenosis within the distal M1 right MCA. 4. Sites of severe stenosis within bilateral proximal M2 MCA branches. 5. Moderate/severe stenosis within a distal M2 left MCA branch. 6. Severe stenosis within the distal A2 left ACA. 7. Moderate stenosis within the  P2 left PCA. 8. Severe stenosis within the right PCA at the P2/P3 junction. Electronically Signed   By: Kellie Simmering DO   On: 01/16/2020 16:15    ____________________________________________   PROCEDURES  Procedure(s) performed:   Procedures  None  ____________________________________________   INITIAL IMPRESSION / ASSESSMENT AND PLAN / ED COURSE  Pertinent labs & imaging results that were available during my care of the patient were reviewed by me and considered in my medical decision making (see chart for details).   Patient presents to the emergency department for evaluation of strokelike symptoms.  Last seen normal 10 PM yesterday and so is out of the TPA window on arrival to the emergency department this morning.  Patient does have mild right facial asymmetry which is apparently new but no other neurologic deficits.  Plan for stroke/TIA labs and CT head. VAN negative.  CT head reviewed with no acute findings.  Discussed with teleneurology and they will evaluate the patient at bedside to provide other additional recommendations.  Patient sent for MRI brain without contrast.   Discussed patient's case with [facility] to request admission. Patient and family (if present) updated with plan. Care transferred to [facility] service.  I reviewed all nursing notes, vitals, pertinent old records, EKGs, labs, imaging (as available).  Teleneurology called back to discuss their impression after seeing the patient.  They will leave official recommendations in the chart for the hospitalist team. ____________________________________________  FINAL CLINICAL IMPRESSION(S) / ED DIAGNOSES  Final diagnoses:  Weakness on right side of face     MEDICATIONS GIVEN DURING THIS VISIT:  Medications  furosemide (LASIX) tablet 40 mg (40 mg Oral Given 01/16/20 1724)  clopidogrel (PLAVIX) tablet 75 mg (has no administration in time range)  vitamin B-12 (CYANOCOBALAMIN) tablet 1,000 mcg (has no administration in  time range)  gabapentin (NEURONTIN) capsule 100 mg (100 mg Oral Given 01/16/20 2116)  cholecalciferol (VITAMIN D3) tablet 1,000 Units (has no administration in time  range)  loratadine (CLARITIN) tablet 10 mg (has no administration in time range)  0.9 %  sodium chloride infusion ( Intravenous New Bag/Given 01/16/20 1643)  acetaminophen (TYLENOL) tablet 650 mg (has no administration in time range)    Or  acetaminophen (TYLENOL) 160 MG/5ML solution 650 mg (has no administration in time range)    Or  acetaminophen (TYLENOL) suppository 650 mg (has no administration in time range)  senna-docusate (Senokot-S) tablet 1 tablet (1 tablet Oral Given 01/16/20 1643)  enoxaparin (LOVENOX) injection 30 mg (30 mg Subcutaneous Given 01/16/20 2117)  insulin aspart (novoLOG) injection 0-15 Units (2 Units Subcutaneous Given 01/16/20 1724)  insulin aspart (novoLOG) injection 0-5 Units (2 Units Subcutaneous Given 01/16/20 2200)  insulin glargine (LANTUS) injection 10 Units (10 Units Subcutaneous Given 01/16/20 2116)  hydrALAZINE (APRESOLINE) injection 10 mg (has no administration in time range)  aspirin EC tablet 325 mg (has no administration in time range)   stroke: mapping our early stages of recovery book (1 each Does not apply Given 01/16/20 1642)   Note:  This document was prepared using Dragon voice recognition software and may include unintentional dictation errors.  Nanda Quinton, MD, [facility] Emergency [facility]    Zailynn Brandel, [facility], MD 01/17/20 [phone]

## 2020-01-16 NOTE — ED Notes (Addendum)
Pt is aware we need urine sample.  

## 2020-01-16 NOTE — Consult Note (Signed)
[place] A. Merlene Laughter, MD     [email or web address]          Briana Garrison is an 69 y.o. female.   ASSESSMENT/PLAN: 1.  Acute dysarthria likely due to TIA.  I suspect this is most likely due to intracranial occlusive disease although small vessel disease is also possible.  I recommend continue with dual antiplatelet agents.  The dose of aspirin however will be increased to 325.  Continue with Plavix.  She should continue this regimen for 3 months and then afterwards continue with the previous recommendation of Plavix monotherapy.  As usual continue with other risk factor modifications including blood pressure control, cholesterol control/use of statin and diabetes control. 2.  Diabetic polyneuropathy 3.  Cognitive impairment (mild) due to multiple brain insults/infarcts.  This increases the long-term risk of dementia due to vascular disease.   This is a 69 year old black female who presents with acute onset of dysarthria this morning lasting for about 10 minutes.  The dysarthria was noticed by her daughter and husband.  The patient did become aware of this.  She did have a brief episode of dizziness described as a lightheaded sensation.  The patient has had 2 previous infarcts.  She tells me one was in June of this year which resulted in left-sided numbness and weakness.  The more recent one in November was associated with right-sided weakness.  Although she reports this is chart seems to indicate reversing the symptoms with a more recent event occurring in November with left-sided weakness.  She also reports having significant hearing loss with initial stroke in June of this year.  She reports feeling back to baseline.  She has had some numbness and weakness again from her previous infarcts.  She tells me she been compliant with antihypertensive medications and other medications.  She has been receiving physical and occupational therapies.  The review of systems otherwise  negative.   GENERAL: This a very pleasant female who is doing well at this time.  HEENT: Neck is supple and no trauma noted.  She has reduced hearing bilaterally.  ABDOMEN: soft  EXTREMITIES: No edema   BACK: Normal  SKIN: Normal by inspection.    MENTAL STATUS: Alert and oriented -including orientation to her age and the month. Speech, language and cognition are generally intact. Judgment and insight normal.   CRANIAL NERVES: Pupils are equal, round and reactive to light and accomodation; extra ocular movements are full, there is no significant nystagmus; visual fields are full; upper and lower facial muscles are normal in strength and symmetric, there is no flattening of the nasolabial folds; tongue is midline; uvula is midline; shoulder elevation is normal.  MOTOR: Normal tone, bulk and strength; no pronator drift.  There is no drift of the upper or lower extremities.  COORDINATION: Left finger to nose is normal, right finger to nose is normal, No rest tremor; no intention tremor; no postural tremor; no bradykinesia.  REFLEXES: Deep tendon reflexes are symmetrical and normal in the upper extremities but reduced in the legs.  SENSATION: Normal to light touch, temperature, and pain.    NIH stroke scale 0    Blood pressure (!) 154/83, pulse (!) 55, temperature 97.7 F (36.5 C), temperature source Oral, resp. rate 16, height 5\' 1"  (1.549 m), weight 80 kg, SpO2 100 %.  Past Medical History:  Diagnosis Date  . Cancer of kidney ([place]) 05/14/2009  . Diabetes mellitus   . Endometrial mass   . GERD (gastroesophageal reflux  disease)   . Hypercholesterolemia   . Hypertension   . Internal hemorrhoids    S/P colonoscopy 06/10/00 by Dr. Lajoyce Corners  . Shingles     Past Surgical History:  Procedure Laterality Date  . BIOPSY  09/06/2018   Procedure: BIOPSY;  Surgeon: Rogene Houston, MD;  [facility];  Service: Endoscopy;;  . CHOLECYSTECTOMY  11/13/08  . COLONOSCOPY N/A  09/06/2018   Procedure: COLONOSCOPY;  Surgeon: Rogene Houston, MD;  [facility]: [facility];  Service: Endoscopy;  Laterality: N/A;  730  . CYSTOSCOPY KIDNEY W/ URETERAL GUIDE WIRE  05/14/09  . Diagnostic hysteroscopy, D&C, endometrial polypectomy  07/01/2005  . LIPECTOMY    . POLYPECTOMY  09/06/2018   Procedure: POLYPECTOMY;  Surgeon: Rogene Houston, MD;  [facility]: [facility];  Service: Endoscopy;;    Family History  Problem Relation Age of Onset  . Ovarian cancer Mother   . Stroke Mother   . Stroke Father   . Stroke Brother   . Stroke Sister     Social History:  reports that she has never smoked. She has never used smokeless tobacco. She reports that she does not drink alcohol and does not use drugs.  Allergies:  Allergies  Allergen Reactions  . Amlodipine Swelling  . Tramadol Diarrhea and Nausea And Vomiting    Medications: Prior to Admission medications   Medication Sig Start Date End Date Taking? Authorizing Provider  acetaminophen (TYLENOL) 500 MG tablet Take 500 mg by mouth every 6 (six) hours as needed for mild pain or headache.    Yes [provider]  albuterol (PROAIR HFA) 108 (90 Base) MCG/ACT inhaler Inhale 2 puffs into the lungs every 6 (six) hours as needed for wheezing or shortness of breath.    [provider]  atorvastatin (LIPITOR) 80 MG tablet Take 1 tablet (80 mg total) by mouth daily. 06/24/19 07/24/19  Darliss Cheney, MD  carvedilol (COREG) 25 MG tablet Take 25 mg by mouth 2 (two) times daily. 12/01/19   [provider]  cetirizine (ZYRTEC) 10 MG tablet Take 10 mg by mouth daily as needed for allergies.    [provider]  Cholecalciferol (VITAMIN D-3) 25 MCG (1000 UT) CAPS Take 1,000 Units by mouth daily.     [provider]  cloNIDine (CATAPRES) 0.1 MG tablet Take 0.2 mg by mouth 2 (two) times daily.     [provider]  clopidogrel (PLAVIX) 75 MG tablet Take 1 tablet (75 mg total) by mouth daily. 10/06/19    Garvin Fila, MD  Dulaglutide (TRULICITY) 1.5 WU/9.8JX SOPN Inject 1.5 mg into the skin every Tuesday.     [provider]  Dulaglutide (TRULICITY) 3 BJ/4.7WG SOPN Inject 3 mg into the skin every Tuesday.    [provider]  furosemide (LASIX) 20 MG tablet Take 40 mg by mouth 2 (two) times daily.  09/04/19   [provider]  gabapentin (NEURONTIN) 100 MG capsule Take 100 mg by mouth daily as needed (shoulder pain).    [provider]  glucose 4 GM chewable tablet Chew 1 tablet by mouth once as needed for low blood sugar.    [provider]  insulin aspart (NOVOLOG FLEXPEN) 100 UNIT/ML FlexPen Inject into the skin 3 (three) times daily with meals. Per sliding scale - based on CBG    [provider]  insulin glargine (LANTUS) 100 unit/mL SOPN Inject 18 Units into the skin at bedtime.    [provider]  senna-docusate (SENOKOT-S) 8.6-50 MG tablet Take 1 tablet by mouth daily as needed for mild constipation.    [provider]  telmisartan (MICARDIS) 20 MG tablet Take 20 mg by mouth daily.  08/30/19   [provider]  vitamin B-12 (CYANOCOBALAMIN) 1000 MCG tablet Take 1,000 mcg by mouth daily.    [provider]    Scheduled Meds: . [START ON 01/17/2020] aspirin EC  81 mg Oral Daily  . [START ON 01/17/2020] cholecalciferol  1,000 Units Oral Daily  . [START ON 01/17/2020] clopidogrel  75 mg Oral Daily  . enoxaparin (LOVENOX) injection  30 mg Subcutaneous Q24H  . furosemide  40 mg Oral BID  . gabapentin  100 mg Oral QHS  . insulin aspart  0-15 Units Subcutaneous TID WC  . insulin aspart  0-5 Units Subcutaneous QHS  . insulin glargine  10 Units Subcutaneous QHS  . [START ON 01/17/2020] loratadine  10 mg Oral Daily  . [START ON 01/17/2020] vitamin B-12  1,000 mcg Oral Daily   Continuous Infusions: . sodium chloride 10 mL/hr at 01/16/20 1643   PRN Meds:.acetaminophen **OR** acetaminophen (TYLENOL) oral  liquid 160 mg/5 mL **OR** acetaminophen, hydrALAZINE, senna-docusate     Results for orders placed or performed during the hospital encounter of 01/16/20 (from the past 48 hour(s))  Ethanol     Status: None   Collection Time: 01/16/20 10:22 AM  Result Value Ref Range   Alcohol, Ethyl (B) <10 <10 mg/dL    Comment: (NOTE) Lowest detectable limit for serum alcohol is 10 mg/dL.  For medical purposes only. Performed at [facility], [address], [place], [place] [address]   Protime-INR     Status: None   Collection Time: 01/16/20 10:22 AM  Result Value Ref Range   Prothrombin Time 12.8 11.4 - 15.2 seconds   INR 1.0 0.8 - 1.2    Comment: (NOTE) INR goal varies based on device and disease states. Performed at [facility], [address] [place], [place] [address]   APTT     Status: None   Collection Time: 01/16/20 10:22 AM  Result Value Ref Range   aPTT 34 24 - 36 seconds    Comment: Performed at [facility], [address], [place], [place] [address]  CBC     Status: Abnormal   Collection Time: 01/16/20 10:22 AM  Result Value Ref Range   WBC 6.4 4.0 - 10.5 K/uL   RBC 3.95 3.87 - 5.11 MIL/uL   Hemoglobin 11.4 (L) 12.0 - 15.0 g/dL   HCT 35.1 (L) 36.0 - 46.0 %   MCV 88.9 80.0 - 100.0 fL   MCH 28.9 26.0 - 34.0 pg   MCHC 32.5 30.0 - 36.0 g/dL   RDW 13.4 11.5 - 15.5 %   Platelets 261 150 - 400 K/uL   nRBC 0.0 0.0 - 0.2 %    Comment: Performed at [facility], [address] [place], [place] [address]  Differential     Status: None   Collection Time: 01/16/20 10:22 AM  Result Value Ref Range   Neutrophils Relative % 59 %   Neutro Abs 3.8 1.7 - 7.7 K/uL   Lymphocytes Relative 26 %   Lymphs Abs 1.7 0.7 - 4.0 K/uL   Monocytes Relative 11 %   Monocytes Absolute 0.7 0.1 - 1.0 K/uL   Eosinophils Relative 3 %   Eosinophils Absolute 0.2 0.0 - 0.5 K/uL   Basophils Relative 1 %   Basophils Absolute 0.0  0.0 - 0.1 K/uL   Immature Granulocytes 0 %   Abs Immature Granulocytes 0.02  0.00 - 0.07 K/uL    Comment: Performed at [facility], [address], [place], [place] [address]  Comprehensive metabolic panel     Status: Abnormal   Collection Time: 01/16/20 10:22 AM  Result Value Ref Range   Sodium 137 135 - 145 mmol/L   Potassium 4.2 3.5 - 5.1 mmol/L   Chloride 106 98 - 111 mmol/L   CO2 23 22 - 32 mmol/L   Glucose, Bld 210 (H) 70 - 99 mg/dL    Comment: Glucose reference range applies only to samples taken after fasting for at least 8 hours.   BUN 39 (H) 8 - 23 mg/dL   Creatinine, Ser 2.10 (H) 0.44 - 1.00 mg/dL   Calcium 9.6 8.9 - 10.3 mg/dL   Total Protein 7.4 6.5 - 8.1 g/dL   Albumin 3.9 3.5 - 5.0 g/dL   AST 24 15 - 41 U/L   ALT 38 0 - 44 U/L   Alkaline Phosphatase 82 38 - 126 U/L   Total Bilirubin 0.2 (L) 0.3 - 1.2 mg/dL   GFR, Estimated 25 (L) >60 mL/min    Comment: (NOTE) Calculated using the CKD-EPI Creatinine Equation (2021)    Anion gap 8 5 - 15    Comment: Performed at [facility], [address] [place], [place] [address]  Resp Panel by RT-PCR (Flu A&B, Covid) Nasopharyngeal Swab     Status: None   Collection Time: 01/16/20 10:56 AM   Specimen: Nasopharyngeal Swab; Nasopharyngeal(NP) swabs in vial transport medium  Result Value Ref Range   SARS Coronavirus 2 by RT PCR NEGATIVE NEGATIVE    Comment: (NOTE) SARS-CoV-2 target nucleic acids are NOT DETECTED.  The SARS-CoV-2 RNA is generally detectable in upper respiratory specimens during the acute phase of infection. The lowest concentration of SARS-CoV-2 viral copies this assay can detect is 138 copies/mL. A negative result does not preclude SARS-Cov-2 infection and should not be used as the sole basis for treatment or other patient management decisions. A negative result may occur with  improper specimen collection/handling, submission of specimen other than nasopharyngeal swab, presence of viral mutation(s) within the areas targeted by this assay, and inadequate number of viral copies(<138  copies/mL). A negative result must be combined with clinical observations, patient history, and epidemiological information. The expected result is Negative.  Fact Sheet for Patients:  EntrepreneurPulse.com.au  Fact Sheet for Healthcare Providers:  IncredibleEmployment.be  This test is no t yet approved or cleared by the [place] FDA and  has been authorized for detection and/or diagnosis of SARS-CoV-2 by FDA under an Emergency Use Authorization (EUA). This EUA will remain  in effect (meaning this test can be used) for the duration of the COVID-19 declaration under Section 564(b)(1) of the Act, 21 U.S.C.section 360bbb-3(b)(1), unless the authorization is terminated  or revoked sooner.       Influenza A by PCR NEGATIVE NEGATIVE   Influenza B by PCR NEGATIVE NEGATIVE    Comment: (NOTE) The Xpert Xpress SARS-CoV-2/FLU/RSV plus assay is intended as an aid in the diagnosis of influenza from Nasopharyngeal swab specimens and should not be used as a sole basis for treatment. Nasal washings and aspirates are unacceptable for Xpert Xpress SARS-CoV-2/FLU/RSV testing.  Fact Sheet for Patients: EntrepreneurPulse.com.au  Fact Sheet for Healthcare Providers: IncredibleEmployment.be  This test is not yet approved or cleared by the [place] FDA and has been authorized for detection  and/or diagnosis of SARS-CoV-2 by FDA under an Emergency Use Authorization (EUA). This EUA will remain in effect (meaning this test can be used) for the duration of the COVID-19 declaration under Section 564(b)(1) of the Act, 21 U.S.C. section 360bbb-3(b)(1), unless the authorization is terminated or revoked.  Performed at [facility], [address] [place], [place] [address]   CBG monitoring, ED     Status: Abnormal   Collection Time: 01/16/20  1:20 PM  Result Value Ref Range   Glucose-Capillary 175 (H) 70 - 99 mg/dL     Comment: Glucose reference range applies only to samples taken after fasting for at least 8 hours.  Glucose, capillary     Status: Abnormal   Collection Time: 01/16/20  4:38 PM  Result Value Ref Range   Glucose-Capillary 133 (H) 70 - 99 mg/dL    Comment: Glucose reference range applies only to samples taken after fasting for at least 8 hours.    Studies/Results:      BRAIN MRI 12-2019  Moderate cerebral atrophy.  Comparatively mild cerebellar atrophy.  Residual diffusion weighted hyperintensity at site of known patchy late subacute cortical/subcortical infarcts within the cortical/subcortical right parietooccipital lobes (acute at time of prior brain MRI 12/05/2019. A known small subacute infarct within the right insula was better appreciated on the prior MRI.  No acute ischemic infarct is identified.  Redemonstrated chronic lacunar infarcts within the corpus callosum bilateral basal ganglia, thalami, pons and right middle cerebellar peduncle.  Stable background moderate/advanced multifocal T2/FLAIR hyperintensity within the cerebral white matter and pons which is nonspecific, but compatible with chronic small vessel ischemic disease.  Redemonstrated chronic microhemorrhage within the medial left temporal lobe.  No evidence of intracranial mass.  No extra-axial fluid collection.  No midline shift.  Vascular: Reported below.  Skull and upper cervical spine: No focal marrow lesion.  Sinuses/Orbits: Visualized orbits show no acute finding. Paranasal sinus mucosal thickening. Most notably, there is moderate mucosal thickening within the right greater than left maxillary sinuses. Small left sphenoid sinus mucous retention cyst.  MRA HEAD FINDINGS  The intracranial internal carotid arteries are patent. Redemonstrated moderate/severe stenosis of the distal M1 right middle cerebral artery (series 1036, image 9). Redemonstrated moderate stenosis within  the superior division proximal M2 right MCA branch (series 1036, image 4). Redemonstrated high-grade stenosis within the inferior division proximal M2 right MCA branch (series 1036, image 16). The M1 left middle cerebral artery is patent without significant stenosis. Redemonstrated severe focal stenosis within the superior division proximal to mid left M2 MCA branch (series 1036, image 4). Redemonstrated moderate/severe stenosis within an inferior division distal M2 left MCA branch (series 1036, image 4). The anterior cerebral arteries are patent. Redemonstrated severe stenosis within the distal left A2 segment (series 1036, image 8).  Unchanged from the prior exam, the intracranial right vertebral artery is non-dominant and irregular but patent to the basilar. The dominant left vertebral artery is patent without significant stenosis. The basilar artery is patent. The posterior cerebral arteries are patent with atherosclerotic irregularity bilaterally. Most notably, there is a redemonstrated moderate stenosis within the proximal P2 left PCA, and sites of severe stenosis within the right PCA at the P2/P3 junction. Posterior communicating arteries are hypoplastic or absent bilaterally.  No intracranial aneurysm is identified.  IMPRESSION: MRI brain:  1. No evidence of acute intracranial abnormality. 2. Known small late subacute infarcts in the cortical/subcortical right parietooccipital lobes and right insula (acute at time of prior brain MRI 12/05/2019). 3. Redemonstrated  chronic lacunar infarcts within corpus callosum, bilateral basal ganglia, thalami, pons and right middle cerebellar peduncle. 4. Stable background moderate/severe chronic small vessel ischemic disease within the cerebral white matter and pons. 5. Moderate cerebral atrophy. 6. Unchanged chronic paranasal sinus disease, most notably maxillary.  MRA head:  1. No intracranial large vessel occlusion. 2.  Stable, advanced intracranial atherosclerotic disease with multifocal stenoses, most notably as follows. 3. Moderate/severe stenosis within the distal M1 right MCA. 4. Sites of severe stenosis within bilateral proximal M2 MCA branches. 5. Moderate/severe stenosis within a distal M2 left MCA branch. 6. Severe stenosis within the distal A2 left ACA. 7. Moderate stenosis within the P2 left PCA. 8. Severe stenosis within the right PCA at the P2/P3 junction.   MRI MRA BRAIN NECK 11-2019 IMPRESSION: MRI HEAD IMPRESSION:  1. Technically limited exam due to motion artifact and patient's inability to tolerate the full length of the exam. 2. Patchy multifocal small volume acute ischemic infarcts involving the cortical and subcortical right parieto-occipital region and right insula. No associated hemorrhage or mass effect. 3. Underlying age-related cerebral atrophy with moderate to severe chronic microvascular ischemic disease, with multiple remote lacunar infarcts involving the bilateral basal ganglia, thalami, and pons.  MRA HEAD IMPRESSION:  1. Technically limited exam due to extensive motion artifact. 2. No large vessel occlusion. 3. Severe distal right M1 stenosis, with additional moderate to severe atheromatous stenoses involving the left ACA and PCAs as above. Distal small vessel atheromatous irregularity. 4. Question 3 mm focal outpouching extending superiorly from the right ICA terminus at the origin of the right A1, which could reflect focal vessel tortuosity or possibly small aneurysm. Difficult to be certain on this motion degraded exam. Attention at follow-up recommended.  MRA NECK IMPRESSION:  1. Severely limited exam due to extensive motion artifact, lack of IV contrast, and patient's inability to tolerate the full length of the exam. 2. Approximate 30% atheromatous stenosis at the right bifurcation/proximal right ICA. 3. Gross patency of the left carotid artery  system, with no appreciable hemodynamically significant stenosis. 4. Gross patency of the dominant left vertebral artery. Right vertebral artery not visualized, and may be occluded, although appears grossly patent at the skull base on corresponding MRA of the Head.       THE BRAIN MRI SCAN ALONG WITH MRA ARE REVIEWED IN PERSON. The scans compared to the previous scan. There is still residual increased signal seen on DWI involving the right parietal occipital region. There are multiple small increased signal seen there but much less compared to the previous study. There is also a single cortical lesion in the same area again consistent with small infarct. The right subinsular/insular infarct is not seen on the present study. MRA shows a marked stenosis of the right MCA involving mostly the M2 but also the distal M1 branch. There is no hemorrhage noted. There is multiple lacunar infarcts involving the thalami and basal ganglia bilaterally. There is moderate periventricular leukoencephalopathy consistent with chronic microvascular changes.    TTE 11-2019 1. Left ventricular ejection fraction, by estimation, is 60 to 65%. The  left ventricle has normal function. The left ventricle has no regional  wall motion abnormalities. There is moderate concentric left ventricular  hypertrophy. Left ventricular  diastolic parameters are consistent with Grade I diastolic dysfunction  (impaired relaxation). Elevated left atrial pressure.  2. Right ventricular systolic function is normal. The right ventricular  size is normal. Tricuspid regurgitation signal is inadequate for assessing  PA pressure.  3.  The mitral valve is normal in structure. No evidence of mitral valve  regurgitation. No evidence of mitral stenosis.  4. The aortic valve is normal in structure. Aortic valve regurgitation is  not visualized. No aortic stenosis is present.  5. The inferior vena cava is normal in size with greater than  50%  respiratory variability, suggesting right atrial pressure of 3 mmHg.   Abrham Maslowski A. Merlene Laughter, M.D.  Diplomate, Tax adviser of Psychiatry and Neurology ( Neurology). 01/16/2020, 6:11 PM

## 2020-01-16 NOTE — ED Notes (Signed)
TeleNeuro TV at bedside.

## 2020-01-16 NOTE — ED Triage Notes (Signed)
Per pt and EMS patient went to bed normal around 10pm last night but when she woke up this morning her daughter noticed the right side of her mouth was drooping. No other deficits at this time other than left sided weakness from previous stroke.

## 2020-01-16 NOTE — H&P (Signed)
History and Physical  Briana Garrison TGG:[id number] DOB: 1950-10-18 DOA: 01/16/2020   PCP: [name], MD   Patient coming from: Home  Chief Complaint: slurred speech  HPI:  Briana Garrison is a 69 y.o. female with medical history of diabetes mellitus type 2, hypertension, hyperlipidemia, small vessel strokes presenting with slurred speech that was noted after she woke up on the morning of 01/16/2020.  The patient was last seen normal around 10 PM when she went to bed on the evening of 01/15/2020.  Apparently, the patient was getting ready for breakfast when her daughter noticed the patient to have some slurred speech and right facial droop.  The patient herself did not feel that her speech was slurred.  Nevertheless, the patient denied any visual disturbance, headache, focal extremity weakness, syncope, dizziness.  The patient was recently admitted to the hospital at [facility] from 12/05/2019 to 12/08/2019 for an acute ischemic stroke involving the right parietal occipital and insular area.  Patient was sent home with aspirin 81 mg and Plavix 75 mg dual therapy for 3 months, then Plavix alone.  She endorses compliance with all her medications.  She denies any fevers, chills, headache, chest pain, stress breath, coughing, hemoptysis, nausea, vomiting, diarrhea, domino pain. In the emergency department, the patient was afebrile hemodynamically stable with oxygen saturation 99 to 100% on room air.  BMP and LFTs were unremarkable.  WBC 6.4, hemoglobin 11.4, platelets 261,000.  CT of the brain was negative for acute findings.  Neurology was consulted to assist with management.  Assessment/Plan: Slurred Speech -Neurology Consult -PT/OT evaluation -Speech therapy eval -CT brain--neg for acute -MRI brain-- -MRA brain-- -Carotid Duplex-- -12/06/19 Echo--EF 60-65%, no WMA, G1DD -12/06/19 LDL--49 -HbA1C-- -Antiplatelet--ASA 81 mg+plavix  Uncontrolled diabetes mellitus type 2 with  hyperglycemia -12/06/2019 hemoglobin A1c 8.4 -Start reduced dose Lantus -NovoLog sliding scale -Holding Trulicity  CKD 4 -baseline creatinine 1.8-2.1 -am BMP  Essential hypertension -Holding telmisartan, carvedilol, clonidine to allow for permissive hypertension -Hydralazine PRN SBP>220  Hyperlipidemia -Continue Crestor  Diabetic polyneuropathy -Continue gabapentin        Past Medical History:  Diagnosis Date  . Cancer of kidney ([place]) 05/14/2009  . Diabetes mellitus   . Endometrial mass   . GERD (gastroesophageal reflux disease)   . Hypercholesterolemia   . Hypertension   . Internal hemorrhoids    S/P colonoscopy 06/10/00 by Dr. Lajoyce Corners  . Shingles    Past Surgical History:  Procedure Laterality Date  . BIOPSY  09/06/2018   Procedure: BIOPSY;  Surgeon: Rogene Houston, MD;  Location: [facility];  Service: Endoscopy;;  . CHOLECYSTECTOMY  11/13/08  . COLONOSCOPY N/A 09/06/2018   Procedure: COLONOSCOPY;  Surgeon: Rogene Houston, MD;  Location: [facility];  Service: Endoscopy;  Laterality: N/A;  730  . CYSTOSCOPY KIDNEY W/ URETERAL GUIDE WIRE  05/14/09  . Diagnostic hysteroscopy, D&C, endometrial polypectomy  07/01/2005  . LIPECTOMY    . POLYPECTOMY  09/06/2018   Procedure: POLYPECTOMY;  Surgeon: Rogene Houston, MD;  Location: [facility];  Service: Endoscopy;;   Social History:  reports that she has never smoked. She has never used smokeless tobacco. She reports that she does not drink alcohol and does not use drugs.   Family History  Problem Relation Age of Onset  . Ovarian cancer Mother   . Stroke Mother   . Stroke Father   . Stroke Brother   . Stroke Sister  Allergies  Allergen Reactions  . Amlodipine Swelling  . Tramadol Diarrhea and Nausea And Vomiting     Prior to Admission medications   Medication Sig Start Date End Date Taking? Authorizing Provider  acetaminophen (TYLENOL) 500 MG tablet Take 500 mg by mouth every 6 (six) hours as  needed for mild pain or headache.     [provider]  albuterol (PROAIR HFA) 108 (90 Base) MCG/ACT inhaler Inhale 2 puffs into the lungs every 6 (six) hours as needed for wheezing or shortness of breath.    [provider]  atorvastatin (LIPITOR) 80 MG tablet Take 1 tablet (80 mg total) by mouth daily. 06/24/19 07/24/19  Darliss Cheney, MD  carvedilol (COREG) 25 MG tablet Take 25 mg by mouth 2 (two) times daily. 12/01/19   [provider]  cetirizine (ZYRTEC) 10 MG tablet Take 10 mg by mouth daily as needed for allergies.    [provider]  Cholecalciferol (VITAMIN D-3) 25 MCG (1000 UT) CAPS Take 1,000 Units by mouth daily.     [provider]  cloNIDine (CATAPRES) 0.1 MG tablet Take 0.2 mg by mouth 2 (two) times daily.     [provider]  clopidogrel (PLAVIX) 75 MG tablet Take 1 tablet (75 mg total) by mouth daily. 10/06/19   Garvin Fila, MD  Dulaglutide (TRULICITY) 1.5 ZO/1.0RU SOPN Inject 1.5 mg into the skin every Tuesday.     [provider]  Dulaglutide (TRULICITY) 3 EA/5.4UJ SOPN Inject 3 mg into the skin every Tuesday.    [provider]  furosemide (LASIX) 20 MG tablet Take 40 mg by mouth 2 (two) times daily.  09/04/19   [provider]  gabapentin (NEURONTIN) 100 MG capsule Take 100 mg by mouth daily as needed (shoulder pain).    [provider]  glucose 4 GM chewable tablet Chew 1 tablet by mouth once as needed for low blood sugar.    [provider]  insulin aspart (NOVOLOG FLEXPEN) 100 UNIT/ML FlexPen Inject into the skin 3 (three) times daily with meals. Per sliding scale - based on CBG    [provider]  insulin glargine (LANTUS) 100 unit/mL SOPN Inject 18 Units into the skin at bedtime.    [provider]  senna-docusate (SENOKOT-S) 8.6-50 MG tablet Take 1 tablet by mouth daily as needed for mild constipation.    [provider]  telmisartan (MICARDIS) 20 MG  tablet Take 20 mg by mouth daily.  08/30/19   [provider]  vitamin B-12 (CYANOCOBALAMIN) 1000 MCG tablet Take 1,000 mcg by mouth daily.    [provider]    Review of Systems:  Constitutional:  No weight loss, night sweats, Fevers, chills, fatigue.  Head&Eyes: No headache.  No vision loss.  No eye pain or scotoma ENT:  No Difficulty swallowing,Tooth/dental problems,Sore throat,  No ear ache, post nasal drip,  Cardio-vascular:  No chest pain, Orthopnea, PND, swelling in lower extremities,  dizziness, palpitations  GI:  No  abdominal pain, nausea, vomiting, diarrhea, loss of appetite, hematochezia, melena, heartburn, indigestion, Resp:  No shortness of breath with exertion or at rest. No cough. No coughing up of blood .No wheezing.No chest wall deformity  Skin:  no rash or lesions.  GU:  no dysuria, change in color of urine, no urgency or frequency. No flank pain.  Musculoskeletal:  No joint pain or swelling. No decreased range of motion. No back pain.  Psych:  No change in mood or affect.  No depression or anxiety. Neurologic: No headache, no dysesthesia, no focal weakness, no vision loss. No syncope  Physical Exam: Vitals:   01/16/20 1140 01/16/20 1143 01/16/20 1300 01/16/20 1330  BP:  (!) 156/82 (!) 175/79 (!) 162/76  Pulse:  (!) 58 60 60  Resp:  14 18 15   Temp: 98.4 F (36.9 C)   98 F (36.7 C)  TempSrc: Oral   Oral  SpO2:  100% 100% 99%  Weight:      Height:       General:  A&O x 3, NAD, nontoxic, pleasant/cooperative Head/Eye: No conjunctival hemorrhage, no icterus, [place]/AT, No nystagmus ENT:  No icterus,  No thrush, good dentition, no pharyngeal exudate Neck:  No masses, no lymphadenpathy, no bruits CV:  RRR, no rub, no gallop, no S3 Lung:  CTAB, good air movement, no wheeze, no rhonchi Abdomen: soft/NT, +BS, nondistended, no peritoneal signs Ext: No cyanosis, No rashes, No petechiae, No lymphangitis, No edema Neuro: CNII-XII intact, strength  4/5 in bilateral upper and lower extremities, no dysmetria  Labs on Admission:  Basic Metabolic Panel: Recent Labs  Lab 01/16/20 1022  NA 137  K 4.2  CL 106  CO2 23  GLUCOSE 210*  BUN 39*  CREATININE 2.10*  CALCIUM 9.6   Liver Function Tests: Recent Labs  Lab 01/16/20 1022  AST 24  ALT 38  ALKPHOS 82  BILITOT 0.2*  PROT 7.4  ALBUMIN 3.9   No results for input(s): LIPASE, AMYLASE in the last 168 hours. No results for input(s): AMMONIA in the last 168 hours. CBC: Recent Labs  Lab 01/16/20 1022  WBC 6.4  NEUTROABS 3.8  HGB 11.4*  HCT 35.1*  MCV 88.9  PLT 261   Coagulation Profile: Recent Labs  Lab 01/16/20 1022  INR 1.0   Cardiac Enzymes: No results for input(s): CKTOTAL, CKMB, CKMBINDEX, TROPONINI in the last 168 hours. BNP: Invalid input(s): POCBNP CBG: Recent Labs  Lab 01/16/20 1320  GLUCAP 175*   Urine analysis:    Component Value Date/Time   COLORURINE YELLOW 07/25/2019 0508   APPEARANCEUR HAZY (A) 07/25/2019 0508   LABSPEC 1.009 07/25/2019 0508   PHURINE 5.0 07/25/2019 0508   GLUCOSEU 50 (A) 07/25/2019 0508   HGBUR MODERATE (A) 07/25/2019 0508   BILIRUBINUR NEGATIVE 07/25/2019 0508   KETONESUR NEGATIVE 07/25/2019 0508   PROTEINUR 30 (A) 07/25/2019 0508   UROBILINOGEN 0.2 06/23/2011 1100   NITRITE NEGATIVE 07/25/2019 0508   LEUKOCYTESUR MODERATE (A) 07/25/2019 0508   Sepsis Labs: @LABRCNTIP (procalcitonin:4,lacticidven:4) ) Recent Results (from the past 240 hour(s))  Resp Panel by RT-PCR (Flu A&B, Covid) Nasopharyngeal Swab     Status: None   Collection Time: 01/16/20 10:56 AM   Specimen: Nasopharyngeal Swab; Nasopharyngeal(NP) swabs in vial transport medium  Result Value Ref Range Status   SARS Coronavirus 2 by RT PCR NEGATIVE NEGATIVE Final    Comment: (NOTE) SARS-CoV-2 target nucleic acids are NOT DETECTED.  The SARS-CoV-2 RNA is generally detectable in upper respiratory specimens during the acute phase of infection. The  lowest concentration of SARS-CoV-2 viral copies this assay can detect is 138 copies/mL. A negative result does not preclude SARS-Cov-2 infection and should not be used as the sole basis for treatment or other patient management decisions. A negative result may occur with  improper specimen collection/handling, submission of specimen other than nasopharyngeal swab, presence of viral mutation(s) within the areas targeted by this assay, and inadequate number of viral copies(<138 copies/mL). A negative result must be combined with clinical  observations, patient history, and epidemiological information. The expected result is Negative.  Fact Sheet for Patients:  EntrepreneurPulse.com.au  Fact Sheet for Healthcare Providers:  IncredibleEmployment.be  This test is no t yet approved or cleared by the [place] FDA and  has been authorized for detection and/or diagnosis of SARS-CoV-2 by FDA under an Emergency Use Authorization (EUA). This EUA will remain  in effect (meaning this test can be used) for the duration of the COVID-19 declaration under Section 564(b)(1) of the Act, 21 U.S.C.section 360bbb-3(b)(1), unless the authorization is terminated  or revoked sooner.       Influenza A by PCR NEGATIVE NEGATIVE Final   Influenza B by PCR NEGATIVE NEGATIVE Final    Comment: (NOTE) The Xpert Xpress SARS-CoV-2/FLU/RSV plus assay is intended as an aid in the diagnosis of influenza from Nasopharyngeal swab specimens and should not be used as a sole basis for treatment. Nasal washings and aspirates are unacceptable for Xpert Xpress SARS-CoV-2/FLU/RSV testing.  Fact Sheet for Patients: EntrepreneurPulse.com.au  Fact Sheet for Healthcare Providers: IncredibleEmployment.be  This test is not yet approved or cleared by the [place] FDA and has been authorized for detection and/or diagnosis of SARS-CoV-2 by FDA under  an Emergency Use Authorization (EUA). This EUA will remain in effect (meaning this test can be used) for the duration of the COVID-19 declaration under Section 564(b)(1) of the Act, 21 U.S.C. section 360bbb-3(b)(1), unless the authorization is terminated or revoked.  Performed at [facility], [address] [place], [place] [address]      Radiological Exams on Admission: CT HEAD WO CONTRAST  Result Date: 01/16/2020 CLINICAL DATA:  Transient ischemic attack. Slurred speech and possible facial droop. EXAM: CT HEAD WITHOUT CONTRAST TECHNIQUE: Contiguous axial images were obtained from the base of the skull through the vertex without intravenous contrast. COMPARISON:  CT head December 05, 2019.  MRI November 8, 21. FINDINGS: Brain: No evidence of acute large vascular territory infarction, hemorrhage, hydrocephalus, extra-axial collection or mass lesion/mass effect. Remote lacunar infarcts in bilateral thalami. Patchy white matter hypoattenuation, most likely related to chronic microvascular ischemic disease. Chronic lacunar infarct in the brainstem, which is not well visualized on this study. Vascular: Calcific atherosclerosis. No hyperdense vessel identified. Skull: No acute fracture. Sinuses/Orbits: Partially imaged chronic right maxillary sinus opacification with maxillary sinus wall thickening. Retention cyst in the left sphenoid sinus. Other: No mastoid effusions. IMPRESSION: 1. No evidence of acute intracranial abnormality. 2. Remote lacunar infarcts in the brainstem and bilateral thalami. 3. Chronic microvascular ischemic disease. 4. Partially imaged findings suggestive of chronic right maxillary sinusitis. Electronically Signed   By: Margaretha Sheffield MD   On: 01/16/2020 13:17    EKG: Independently reviewed. Sinus, nonspecific T wave changes   Time spent:60 minutes Code Status:   FULL Family Communication:  No Family at bedside Disposition Plan: expect 1 day hospitalization Consults  called: neurology DVT Prophylaxis: Waves Lovenox  Orson Eva, DO  [facility] Pager [phone]  If 7PM-7AM, please contact night-coverage [email or web address] Password [facility] 01/16/2020, 2:38 PM

## 2020-01-16 NOTE — Plan of Care (Signed)

## 2020-01-16 NOTE — Consult Note (Signed)
[facility] TeleNeurology Consult Services  Stat Consult  Date of Service:   01/16/2020 14:20:03  Diagnosis:     .  G45.9 - Transient cerebral ischemic attack, unspecified  Impression: The patient is a 69 year old woman with a history of hypertension, diabetes, hyperlipidemia, recent right MCA CVA, who presents with transient right facial droop, felt most likely to represent TIA. New symptoms contralateral to known CVA and MCA stenosis raising concern for cardioembolic process. Recommend evaluation of vascular risk factors with MRI brain. Consider TEE and cardiac event monitor placement. Tolerate permissive hypertension with BP goal < 185/110. Continue home antiplatelets.  CT HEAD: Reviewed 1. No evidence of acute intracranial abnormality. 2. Remote lacunar infarcts in the brainstem and bilateral thalami. 3. Chronic microvascular ischemic disease. 4. Partially imaged findings suggestive of chronic right maxillary sinusitis.  Our recommendations are outlined below.  Laboratory Studies: Recommend Lipid panel Hemoglobin A1c  Nursing Recommendations: Telemetry, IV Fluids, avoid dextrose containing fluids, Maintain euglycemia Neuro checks q4 hrs x 24 hrs and then per shift Head of bed 30 degrees  Consultations: Recommend Speech therapy if failed dysphagia screen Physical therapy/Occupational therapy  Additional Recommendations:    Imaging: MRI brain 11/21 - 1. Technically limited exam due to motion artifact and patient's inability to tolerate the full length of the exam. 2. Patchy multifocal small volume acute ischemic infarcts involving the cortical and subcortical right parieto-occipital region and right insula. No associated hemorrhage or mass effect. 3. Underlying age-related cerebral atrophy with moderate to severe chronic microvascular ischemic disease, with multiple remote lacunar infarcts involving the bilateral basal ganglia, thalami, and pons.   MRA  HEAD IMPRESSION 11/21: 1. Technically limited exam due to extensive motion artifact. 2. No large vessel occlusion. 3. Severe distal right M1 stenosis, with additional moderate to severe atheromatous stenoses involving the left ACA and PCAs as above. Distal small vessel atheromatous irregularity. 4. Question 3 mm focal outpouching extending superiorly from the right ICA terminus at the origin of the right A1, which could reflect focal vessel tortuosity or possibly small aneurysm. Difficult to be certain on this motion degraded exam. Attention at follow-up recommended. MRA NECK 11/21: 1. Severely limited exam due to extensive motion artifact, lack of IV contrast, and patient's inability to tolerate the full length of the exam. 2. Approximate 30% atheromatous stenosis at the right bifurcation/proximal right ICA. 3. Gross patency of the left carotid artery system, with no appreciable hemodynamically significant stenosis. 4. Gross patency of the dominant left vertebral artery. Right vertebral artery not visualized, and may be occluded, although appears grossly patent at the skull base on corresponding MRA of the head.  Metrics: [facility] Notification Time: 01/16/2020 14:17:25 Stamp Time: 01/16/2020 14:20:03 Callback Response Time: 01/16/2020 14:22:49   ----------------------------------------------------------------------------------------------------  Chief Complaint: right face droop  History of Present Illness: Patient is a 69 year old Female.  This AM noted by daughter to be having right facial droop. Last seen normal night prior. In ER, symptoms have resolved, patient endorses being back to baseline. Has residual left sided deficits from prior CVA. In May 2021, found to have right thalamic lacunar infarct. In November 2021, found to have multifocal infarcts in right parietooccipital territory with severe right MCA stenosis. Discharged on ASA & Plavix x 3 months. MRI brain 11/21 - 1. Technically  limited exam due to motion artifact and patient's inability to tolerate the full length of the exam. 2. Patchy multifocal small volume acute ischemic infarcts involving the cortical and subcortical right parieto-occipital region  and right insula. No associated hemorrhage or mass effect. 3. Underlying age-related cerebral atrophy with moderate to severe chronic microvascular ischemic disease, with multiple remote lacunar infarcts involving the bilateral basal ganglia, thalami, and pons.   MRA HEAD IMPRESSION 11/21: 1. Technically limited exam due to extensive motion artifact. 2. No large vessel occlusion. 3. Severe distal right M1 stenosis, with additional moderate to severe atheromatous stenoses involving the left ACA and PCAs as above. Distal small vessel atheromatous irregularity. 4. Question 3 mm focal outpouching extending superiorly from the right ICA terminus at the origin of the right A1, which could reflect focal vessel tortuosity or possibly small aneurysm. Difficult to be certain on this motion degraded exam. Attention at follow-up recommended. MRA NECK 11/21: 1. Severely limited exam due to extensive motion artifact, lack of IV contrast, and patient's inability to tolerate the full length of the exam. 2. Approximate 30% atheromatous stenosis at the right bifurcation/proximal right ICA. 3. Gross patency of the left carotid artery system, with no appreciable hemodynamically significant stenosis. 4. Gross patency of the dominant left vertebral artery. Right vertebral artery not visualized, and may be occluded, although appears grossly patent at the skull base on corresponding MRA of the head.   Past Medical History:     . Hypertension     . Diabetes Mellitus     . Hyperlipidemia     . Stroke     . GERD, CVA 6/21 and 11/21   Antiplatelet use: Yes ASA & Plavix   Examination: BP(156/82), Pulse(58), Blood Glucose(210) 1A: Level of Consciousness - Alert; keenly responsive + 0 1B: Ask Month and  Age - Both Questions Right + 0 1C: Blink Eyes & Squeeze Hands - Performs Both Tasks + 0 2: Test Horizontal Extraocular Movements - Normal + 0 3: Test Visual Fields - No Visual Loss + 0 4: Test Facial Palsy (Use Grimace if Obtunded) - Normal symmetry + 0 5A: Test Left Arm Motor Drift - No Drift for 10 Seconds + 0 5B: Test Right Arm Motor Drift - No Drift for 10 Seconds + 0 6A: Test Left Leg Motor Drift - No Drift for 5 Seconds + 0 6B: Test Right Leg Motor Drift - No Drift for 5 Seconds + 0 7: Test Limb Ataxia (FNF/Heel-Shin) - No Ataxia + 0 8: Test Sensation - Mild-Moderate Loss: Less Sharp/More Dull + 1 9: Test Language/Aphasia - Normal; No aphasia + 0 10: Test Dysarthria - Normal + 0 11: Test Extinction/Inattention - No abnormality + 0  NIHSS Score: 1   Patient / Family was informed the Neurology Consult would occur via TeleHealth consult by way of interactive audio and video telecommunications and consented to receiving care in this manner.  Patient is being evaluated for possible acute neurologic impairment and high probability of imminent or life - threatening deterioration.I spent total of 20 minutes providing care to this patient, including time for face to face visit via telemedicine, review of medical records, imaging studies and discussion of findings with providers, the patient and / or family.   Dr Serita Grammes   [facility] [phone]  Case [id number]

## 2020-01-17 ENCOUNTER — Observation Stay (HOSPITAL_COMMUNITY): Payer: Medicare PPO

## 2020-01-17 DIAGNOSIS — E1165 Type 2 diabetes mellitus with hyperglycemia: Secondary | ICD-10-CM | POA: Diagnosis not present

## 2020-01-17 DIAGNOSIS — N184 Chronic kidney disease, stage 4 (severe): Secondary | ICD-10-CM | POA: Diagnosis not present

## 2020-01-17 DIAGNOSIS — R4781 Slurred speech: Secondary | ICD-10-CM | POA: Diagnosis not present

## 2020-01-17 LAB — MAGNESIUM: Magnesium: 2.1 mg/dL (ref 1.7–2.4)

## 2020-01-17 LAB — BASIC METABOLIC PANEL
Anion gap: 10 (ref 5–15)
BUN: 38 mg/dL — ABNORMAL HIGH (ref 8–23)
CO2: 22 mmol/L (ref 22–32)
Calcium: 9.5 mg/dL (ref 8.9–10.3)
Chloride: 103 mmol/L (ref 98–111)
Creatinine, Ser: 1.84 mg/dL — ABNORMAL HIGH (ref 0.44–1.00)
GFR, Estimated: 29 mL/min — ABNORMAL LOW (ref >60)
Glucose, Bld: 190 mg/dL — ABNORMAL HIGH (ref 70–99)
Potassium: 4 mmol/L (ref 3.5–5.1)
Sodium: 135 mmol/L (ref 135–145)

## 2020-01-17 LAB — LIPID PANEL
Cholesterol: 101 mg/dL (ref 0–200)
HDL: 42 mg/dL (ref >40)
LDL Cholesterol: 24 mg/dL (ref 0–99)
Total CHOL/HDL Ratio: 2.4 RATIO
Triglycerides: 177 mg/dL — ABNORMAL HIGH (ref <150)
VLDL: 35 mg/dL (ref 0–40)

## 2020-01-17 LAB — HEMOGLOBIN A1C
Hgb A1c MFr Bld: 8 % — ABNORMAL HIGH (ref 4.8–5.6)
Mean Plasma Glucose: 182.9 mg/dL

## 2020-01-17 LAB — GLUCOSE, CAPILLARY: Glucose-Capillary: 175 mg/dL — ABNORMAL HIGH (ref 70–99)

## 2020-01-17 MED ORDER — ASPIRIN 325 MG PO TBEC: 325.0000 mg | DELAYED_RELEASE_TABLET | Freq: Every day | ORAL | 0 refills | Status: DC

## 2020-01-17 NOTE — Plan of Care (Signed)

## 2020-01-17 NOTE — Plan of Care (Signed)
Problem: Education: Goal: Knowledge of General Education information will improve Description: Including pain rating scale, medication(s)/side effects and non-pharmacologic comfort measures 01/17/2020 0837 by Adam Phenix, RN Outcome: Progressing 01/17/2020 0823 by Adam Phenix, RN Outcome: Progressing   Problem: Health Behavior/Discharge Planning: Goal: Ability to manage health-related needs will improve 01/17/2020 0837 by Adam Phenix, RN Outcome: Progressing 01/17/2020 0823 by Adam Phenix, RN Outcome: Progressing   Problem: Clinical Measurements: Goal: Ability to maintain clinical measurements within normal limits will improve 01/17/2020 0837 by Adam Phenix, RN Outcome: Progressing 01/17/2020 0823 by Adam Phenix, RN Outcome: Progressing Goal: Will remain free from infection 01/17/2020 0837 by Adam Phenix, RN Outcome: Progressing 01/17/2020 0823 by Adam Phenix, RN Outcome: Progressing Goal: Diagnostic test results will improve 01/17/2020 0837 by Adam Phenix, RN Outcome: Progressing 01/17/2020 0823 by Adam Phenix, RN Outcome: Progressing Goal: Respiratory complications will improve 01/17/2020 0837 by Adam Phenix, RN Outcome: Progressing 01/17/2020 0823 by Adam Phenix, RN Outcome: Progressing Goal: Cardiovascular complication will be avoided 01/17/2020 0837 by Adam Phenix, RN Outcome: Progressing 01/17/2020 0823 by Adam Phenix, RN Outcome: Progressing   Problem: Activity: Goal: Risk for activity intolerance will decrease 01/17/2020 0837 by Adam Phenix, RN Outcome: Progressing 01/17/2020 0823 by Adam Phenix, RN Outcome: Progressing   Problem: Nutrition: Goal: Adequate nutrition will be maintained 01/17/2020 0837 by Adam Phenix, RN Outcome: Progressing 01/17/2020 0823 by Adam Phenix, RN Outcome:  Progressing   Problem: Coping: Goal: Level of anxiety will decrease 01/17/2020 0837 by Adam Phenix, RN Outcome: Progressing 01/17/2020 0823 by Adam Phenix, RN Outcome: Progressing   Problem: Elimination: Goal: Will not experience complications related to bowel motility 01/17/2020 0837 by Adam Phenix, RN Outcome: Progressing 01/17/2020 0823 by Adam Phenix, RN Outcome: Progressing Goal: Will not experience complications related to urinary retention 01/17/2020 0837 by Adam Phenix, RN Outcome: Progressing 01/17/2020 0823 by Adam Phenix, RN Outcome: Progressing   Problem: Pain Managment: Goal: General experience of comfort will improve 01/17/2020 0837 by Adam Phenix, RN Outcome: Progressing 01/17/2020 0823 by Adam Phenix, RN Outcome: Progressing   Problem: Safety: Goal: Ability to remain free from injury will improve 01/17/2020 0837 by Adam Phenix, RN Outcome: Progressing 01/17/2020 0823 by Adam Phenix, RN Outcome: Progressing   Problem: Skin Integrity: Goal: Risk for impaired skin integrity will decrease 01/17/2020 0837 by Adam Phenix, RN Outcome: Progressing 01/17/2020 0823 by Adam Phenix, RN Outcome: Progressing   Problem: Education: Goal: Knowledge of disease or condition will improve Outcome: Progressing Goal: Knowledge of secondary prevention will improve Outcome: Progressing Goal: Knowledge of patient specific risk factors addressed and post discharge goals established will improve Outcome: Progressing Goal: Individualized Educational Video(s) Outcome: Progressing   Problem: Coping: Goal: Will verbalize positive feelings about self Outcome: Progressing Goal: Will identify appropriate support needs Outcome: Progressing   Problem: Health Behavior/Discharge Planning: Goal: Ability to manage health-related needs will improve Outcome: Progressing    Problem: Self-Care: Goal: Ability to participate in self-care as condition permits will improve Outcome: Progressing Goal: Verbalization of feelings and concerns over difficulty with self-care will improve Outcome: Progressing Goal: Ability to communicate needs accurately will improve Outcome: Progressing   Problem: Nutrition: Goal: Risk of aspiration will decrease Outcome: Progressing Goal: Dietary intake will improve Outcome: Progressing   Problem: Intracerebral Hemorrhage Tissue Perfusion: Goal: Complications of Intracerebral Hemorrhage will be minimized Outcome: Progressing   Problem:  Ischemic Stroke/TIA Tissue Perfusion: Goal: Complications of ischemic stroke/TIA will be minimized Outcome: Progressing   Problem: Spontaneous Subarachnoid Hemorrhage Tissue Perfusion: Goal: Complications of Spontaneous Subarachnoid Hemorrhage will be minimized Outcome: Progressing

## 2020-01-17 NOTE — Evaluation (Signed)
Occupational Therapy Evaluation Patient Details Name: Briana Garrison MRN: [id number] DOB: May 28, 1950 Today's Date: 01/17/2020    History of Present Illness Briana Garrison is a 69 y.o. female with medical history of diabetes mellitus type 2, hypertension, hyperlipidemia, small vessel strokes presenting with slurred speech that was noted after she woke up on the morning of 01/16/2020.  The patient was last seen normal around 10 PM when she went to bed on the evening of 01/15/2020.  Apparently, the patient was getting ready for breakfast when her daughter noticed the patient to have some slurred speech and right facial droop.  The patient herself did not feel that her speech was slurred.  Nevertheless, the patient denied any visual disturbance, headache, focal extremity weakness, syncope, dizziness.  The patient was recently admitted to the hospital at [facility] from 12/05/2019 to 12/08/2019 for an acute ischemic stroke involving the right parietal occipital and insular infarct. MRI negative for new acute CVA   Clinical Impression   Pt agreeable to OT evaluation this am, reports she is feeling well. Pt performing ADLs and functional mobility tasks at supervision level. Pt reports decreased grip strength from prior CVA, however required no assistance during ADLs this am. Pt is performing tasks at her baseline, reports husband and daughter are available should she need assistance at home. No further OT services required at this time.     Follow Up Recommendations  No OT follow up    Equipment Recommendations  None recommended by OT       Precautions / Restrictions Precautions Precautions: None Restrictions Weight Bearing Restrictions: No      Mobility Bed Mobility Overal bed mobility: Modified Independent                  Transfers Overall transfer level: Needs assistance Equipment used: Quad cane Transfers: Sit to/from [facility] Sit to Stand:  Supervision Stand pivot transfers: Supervision                ADL either performed or assessed with clinical judgement   ADL Overall ADL's : Needs assistance/impaired     Grooming: Wash/dry hands;Wash/dry face;Oral care;Brushing hair;Supervision/safety;Standing Grooming Details (indicate cue type and reason): pt standing at sink for ~8 minutes performing grooming tasks using BUE, no LOB, pt with min difficulty manipulating items         Upper Body Dressing : Modified independent;Sitting       Toilet Transfer: Supervision/safety;Ambulation (quad cane) Toilet Transfer Details (indicate cue type and reason): pt performing mobility to and from toilet without difficulty Toileting- Clothing Manipulation and Hygiene: Supervision/safety;Sitting/lateral lean;Sit to/from stand Toileting - Clothing Manipulation Details (indicate cue type and reason): pt doffing/donning LB clothing without difficulty while standing     Functional mobility during ADLs: Supervision/safety;Cane General ADL Comments: pt performing ADLs at baseline, slightly increased time required for coordination due to prior CVAs     Vision Baseline Vision/History: No visual deficits Patient Visual Report: No change from baseline Vision Assessment?: No apparent visual deficits            Pertinent Vitals/Pain Pain Assessment: 0-10 Pain Score: 1  Pain Location: ear Pain Descriptors / Indicators: Sore Pain Intervention(s): Limited activity within patient's tolerance     Hand Dominance Right   Extremity/Trunk Assessment Upper Extremity Assessment Upper Extremity Assessment: Overall WFL for tasks assessed   Lower Extremity Assessment Lower Extremity Assessment: Defer to PT evaluation   Cervical / Trunk Assessment Cervical / Trunk Assessment: Normal   Communication  Communication Communication: No difficulties   Cognition Arousal/Alertness: Awake/alert Behavior During Therapy: WFL for tasks  assessed/performed Overall Cognitive Status: Within Functional Limits for tasks assessed                                                Home Living Family/patient expects to be discharged to:: Private residence Living Arrangements: Spouse/significant other;Children (daughter) Available Help at Discharge: Family;Available 24 hours/day Type of Home: House Home Access: Stairs to enter [employer] of Steps: 2 Entrance Stairs-Rails: None Home Layout: One level     Bathroom Shower/Tub: Teacher, early years/pre: Handicapped height     Home Equipment: Bedside commode;Cane - quad;Shower seat          Prior Functioning/Environment Level of Independence: Independent with assistive device(s)        Comments: Report R rotator cuff tear and is waiting on surgery for about a year - manages with gabapentin at home.  Reports was going to start outpt PT for general strengthening/prior CVA recovery        OT Problem List: Decreased activity tolerance       AM-PAC OT "6 Clicks" Daily Activity     Outcome Measure Help from another person eating meals?: None Help from another person taking care of personal grooming?: A Little Help from another person toileting, which includes using toliet, bedpan, or urinal?: None Help from another person bathing (including washing, rinsing, drying)?: A Little Help from another person to put on and taking off regular upper body clothing?: None Help from another person to put on and taking off regular lower body clothing?: None 6 Click Score: 22   End of Session Equipment Utilized During Treatment: Gait belt;Other (comment) (quad cane) Nurse Communication: Mobility status  Activity Tolerance: Patient tolerated treatment well Patient left: in chair;with call bell/phone within reach;with nursing/sitter in room  OT Visit Diagnosis: Muscle weakness (generalized) (M62.81)                Time: 2330-0762 OT Time  Calculation (min): 19 min Charges:  OT General Charges $OT Visit: 1 Visit OT Evaluation $OT Eval Low Complexity: [place], OTR/L  [phone] 01/17/2020, 8:48 AM

## 2020-01-17 NOTE — Plan of Care (Signed)
Problem: Education: Goal: Knowledge of General Education information will improve Description: Including pain rating scale, medication(s)/side effects and non-pharmacologic comfort measures 01/17/2020 1126 by Adam Phenix, RN Outcome: Adequate for Discharge 01/17/2020 0837 by Adam Phenix, RN Outcome: Progressing 01/17/2020 0823 by Adam Phenix, RN Outcome: Progressing   Problem: Health Behavior/Discharge Planning: Goal: Ability to manage health-related needs will improve 01/17/2020 1126 by Adam Phenix, RN Outcome: Adequate for Discharge 01/17/2020 0837 by Adam Phenix, RN Outcome: Progressing 01/17/2020 0823 by Adam Phenix, RN Outcome: Progressing   Problem: Clinical Measurements: Goal: Ability to maintain clinical measurements within normal limits will improve 01/17/2020 1126 by Adam Phenix, RN Outcome: Adequate for Discharge 01/17/2020 0837 by Adam Phenix, RN Outcome: Progressing 01/17/2020 0823 by Adam Phenix, RN Outcome: Progressing Goal: Will remain free from infection 01/17/2020 1126 by Adam Phenix, RN Outcome: Adequate for Discharge 01/17/2020 0837 by Adam Phenix, RN Outcome: Progressing 01/17/2020 0823 by Adam Phenix, RN Outcome: Progressing Goal: Diagnostic test results will improve 01/17/2020 1126 by Adam Phenix, RN Outcome: Adequate for Discharge 01/17/2020 0837 by Adam Phenix, RN Outcome: Progressing 01/17/2020 0823 by Adam Phenix, RN Outcome: Progressing Goal: Respiratory complications will improve 01/17/2020 1126 by Adam Phenix, RN Outcome: Adequate for Discharge 01/17/2020 0837 by Adam Phenix, RN Outcome: Progressing 01/17/2020 0823 by Adam Phenix, RN Outcome: Progressing Goal: Cardiovascular complication will be avoided 01/17/2020 1126 by Adam Phenix, RN Outcome: Adequate for  Discharge 01/17/2020 0837 by Adam Phenix, RN Outcome: Progressing 01/17/2020 0823 by Adam Phenix, RN Outcome: Progressing   Problem: Activity: Goal: Risk for activity intolerance will decrease 01/17/2020 1126 by Adam Phenix, RN Outcome: Adequate for Discharge 01/17/2020 0837 by Adam Phenix, RN Outcome: Progressing 01/17/2020 0823 by Adam Phenix, RN Outcome: Progressing   Problem: Nutrition: Goal: Adequate nutrition will be maintained 01/17/2020 1126 by Adam Phenix, RN Outcome: Adequate for Discharge 01/17/2020 0837 by Adam Phenix, RN Outcome: Progressing 01/17/2020 0823 by Adam Phenix, RN Outcome: Progressing   Problem: Coping: Goal: Level of anxiety will decrease 01/17/2020 1126 by Adam Phenix, RN Outcome: Adequate for Discharge 01/17/2020 0837 by Adam Phenix, RN Outcome: Progressing 01/17/2020 0823 by Adam Phenix, RN Outcome: Progressing   Problem: Elimination: Goal: Will not experience complications related to bowel motility 01/17/2020 1126 by Adam Phenix, RN Outcome: Adequate for Discharge 01/17/2020 0837 by Adam Phenix, RN Outcome: Progressing 01/17/2020 0823 by Adam Phenix, RN Outcome: Progressing Goal: Will not experience complications related to urinary retention 01/17/2020 1126 by Adam Phenix, RN Outcome: Adequate for Discharge 01/17/2020 0837 by Adam Phenix, RN Outcome: Progressing 01/17/2020 0823 by Adam Phenix, RN Outcome: Progressing   Problem: Pain Managment: Goal: General experience of comfort will improve 01/17/2020 1126 by Adam Phenix, RN Outcome: Adequate for Discharge 01/17/2020 0837 by Adam Phenix, RN Outcome: Progressing 01/17/2020 0823 by Adam Phenix, RN Outcome: Progressing   Problem: Safety: Goal: Ability to remain free from injury will improve 01/17/2020  1126 by Adam Phenix, RN Outcome: Adequate for Discharge 01/17/2020 0837 by Adam Phenix, RN Outcome: Progressing 01/17/2020 0823 by Adam Phenix, RN Outcome: Progressing   Problem: Skin Integrity: Goal: Risk for impaired skin integrity will decrease 01/17/2020 1126 by Adam Phenix, RN Outcome: Adequate for Discharge 01/17/2020 0837 by Adam Phenix, RN Outcome: Progressing 01/17/2020 0823 by Adam Phenix, RN  Outcome: Progressing   Problem: Education: Goal: Knowledge of disease or condition will improve 01/17/2020 1126 by Adam Phenix, RN Outcome: Adequate for Discharge 01/17/2020 0837 by Adam Phenix, RN Outcome: Progressing Goal: Knowledge of secondary prevention will improve 01/17/2020 1126 by Adam Phenix, RN Outcome: Adequate for Discharge 01/17/2020 0837 by Adam Phenix, RN Outcome: Progressing Goal: Knowledge of patient specific risk factors addressed and post discharge goals established will improve 01/17/2020 1126 by Adam Phenix, RN Outcome: Adequate for Discharge 01/17/2020 0837 by Adam Phenix, RN Outcome: Progressing Goal: Individualized Educational Video(s) 01/17/2020 1126 by Adam Phenix, RN Outcome: Adequate for Discharge 01/17/2020 0837 by Adam Phenix, RN Outcome: Progressing   Problem: Coping: Goal: Will verbalize positive feelings about self 01/17/2020 1126 by Adam Phenix, RN Outcome: Adequate for Discharge 01/17/2020 0837 by Adam Phenix, RN Outcome: Progressing Goal: Will identify appropriate support needs 01/17/2020 1126 by Adam Phenix, RN Outcome: Adequate for Discharge 01/17/2020 0837 by Adam Phenix, RN Outcome: Progressing   Problem: Health Behavior/Discharge Planning: Goal: Ability to manage health-related needs will improve 01/17/2020 1126 by Adam Phenix, RN Outcome: Adequate for  Discharge 01/17/2020 0837 by Adam Phenix, RN Outcome: Progressing   Problem: Self-Care: Goal: Ability to participate in self-care as condition permits will improve 01/17/2020 1126 by Adam Phenix, RN Outcome: Adequate for Discharge 01/17/2020 0837 by Adam Phenix, RN Outcome: Progressing Goal: Verbalization of feelings and concerns over difficulty with self-care will improve 01/17/2020 1126 by Adam Phenix, RN Outcome: Adequate for Discharge 01/17/2020 0837 by Adam Phenix, RN Outcome: Progressing Goal: Ability to communicate needs accurately will improve 01/17/2020 1126 by Adam Phenix, RN Outcome: Adequate for Discharge 01/17/2020 0837 by Adam Phenix, RN Outcome: Progressing   Problem: Nutrition: Goal: Risk of aspiration will decrease 01/17/2020 1126 by Adam Phenix, RN Outcome: Adequate for Discharge 01/17/2020 0837 by Adam Phenix, RN Outcome: Progressing Goal: Dietary intake will improve 01/17/2020 1126 by Adam Phenix, RN Outcome: Adequate for Discharge 01/17/2020 0837 by Adam Phenix, RN Outcome: Progressing   Problem: Intracerebral Hemorrhage Tissue Perfusion: Goal: Complications of Intracerebral Hemorrhage will be minimized 01/17/2020 1126 by Adam Phenix, RN Outcome: Adequate for Discharge 01/17/2020 0837 by Adam Phenix, RN Outcome: Progressing   Problem: Ischemic Stroke/TIA Tissue Perfusion: Goal: Complications of ischemic stroke/TIA will be minimized 01/17/2020 1126 by Adam Phenix, RN Outcome: Adequate for Discharge 01/17/2020 0355 by Adam Phenix, RN Outcome: Progressing   Problem: Spontaneous Subarachnoid Hemorrhage Tissue Perfusion: Goal: Complications of Spontaneous Subarachnoid Hemorrhage will be minimized 01/17/2020 1126 by Adam Phenix, RN Outcome: Adequate for Discharge 01/17/2020 0837 by Adam Phenix,  RN Outcome: Progressing

## 2020-01-17 NOTE — Evaluation (Signed)
Physical Therapy Evaluation Patient Details Name: Briana Garrison MRN: [id number] DOB: Jun 27, 1950 Today's Date: 01/17/2020   History of Present Illness  Briana Garrison is a 69 y.o. female with medical history of diabetes mellitus type 2, hypertension, hyperlipidemia, small vessel strokes presenting with slurred speech that was noted after she woke up on the morning of 01/16/2020.  The patient was last seen normal around 10 PM when she went to bed on the evening of 01/15/2020.  Apparently, the patient was getting ready for breakfast when her daughter noticed the patient to have some slurred speech and right facial droop.  The patient herself did not feel that her speech was slurred.  Nevertheless, the patient denied any visual disturbance, headache, focal extremity weakness, syncope, dizziness.  The patient was recently admitted to the hospital at [facility] from 12/05/2019 to 12/08/2019 for an acute ischemic stroke involving the right parietal occipital and insular infarct. MRI negative for new acute CVA    Clinical Impression  Patient present in chair after OT eval and agreeable to PT eval. Able to complete sit to stand transfer from chair to standing by sliding out of chair to get feet on floor and able to stand with supervision to [employer]. Ambulate with quad cane x60 ft with intermittent unsteadiness, worse at initial 10 ft of ambulation, but states this is normal for her. Demos and verbalizes appropriate self awareness of safety limitations. Stand pivot and sit to stand transfer back to chair at end of session in preparation for breakfast being served. Nurse notified of ambulation status. Patient reports she is at baseline and does not believe she needs home health PT stating she had it before and wasn't helpful and would like to go to OP PT which she had planned prior to her CVAs.  Patient discharged from physical therapy to care of nursing for ambulation as tolerated for length of stay.       Follow Up Recommendations No PT follow up (was previously going to have OP PT before CVAs, resume if appropriate)    Equipment Recommendations  None recommended by PT    Recommendations for Other Services       Precautions / Restrictions Precautions Precautions: Fall Restrictions Weight Bearing Restrictions: No      Mobility  Bed Mobility Overal bed mobility: Modified Independent                  Transfers Overall transfer level: Needs assistance Equipment used: Quad cane Transfers: Sit to/from [facility] Sit to Stand: Supervision Stand pivot transfers: Supervision       General transfer comment: preference to slide out of chair to get feet on floor, appropriate movement, slightly unsteady but safe  Ambulation/Gait Ambulation/Gait assistance: Supervision Gait Distance (Feet): 60 Feet Assistive device: Quad cane Gait Pattern/deviations: Step-through pattern;Decreased step length - right;Decreased step length - left;Decreased stride length Gait velocity: decreased   General Gait Details: slow, slightly unsteady initially but improved after ambulating 10 ft  Stairs            Wheelchair Mobility    Modified Rankin (Stroke Patients Only)       Balance Overall balance assessment: Needs assistance Sitting-balance support: Bilateral upper extremity supported;Feet unsupported Sitting balance-Leahy Scale: Fair Sitting balance - Comments: in chair feet unsupported   Standing balance support: Single extremity supported;During functional activity Standing balance-Leahy Scale: Fair Standing balance comment: ambulating with quad cane, intermittent unsteadiness  Pertinent Vitals/Pain Pain Assessment: No/denies pain Pain Score: 1  Pain Location: ear Pain Descriptors / Indicators: Sore Pain Intervention(s): Limited activity within patient's tolerance    Home Living Family/patient expects to be  discharged to:: Private residence Living Arrangements: Spouse/significant other;Children (daughter) Available Help at Discharge: Family;Available 24 hours/day Type of Home: House Home Access: Stairs to enter Entrance Stairs-Rails: None Entrance Stairs-Number of Steps: 2 Home Layout: One level Home Equipment: Bedside commode;Cane - quad;Shower seat      Prior Function Level of Independence: Independent with assistive device(s)         Comments: Report R rotator cuff tear and is waiting on surgery for about a year - manages with gabapentin at home.  Reports was going to start outpt PT for general strengthening/prior CVA recovery     Hand Dominance   Dominant Hand: Right    Extremity/Trunk Assessment   Upper Extremity Assessment Upper Extremity Assessment: Defer to OT evaluation    Lower Extremity Assessment Lower Extremity Assessment: Overall WFL for tasks assessed    Cervical / Trunk Assessment Cervical / Trunk Assessment: Normal  Communication   Communication: No difficulties  Cognition Arousal/Alertness: Awake/alert Behavior During Therapy: WFL for tasks assessed/performed Overall Cognitive Status: Within Functional Limits for tasks assessed                                        General Comments      Exercises     Assessment/Plan .   PT Assessment Patent does not need any further PT services  PT Problem List         PT Treatment Interventions   Physical therapy evaluation, therapeutic activity.  Patient discharged to care of nursing for ambulation daily as tolerated for length of stay.    PT Goals (Current goals can be found in the Care Plan section)  Acute Rehab PT Goals Patient Stated Goal: return home PT Goal Formulation: With patient Time For Goal Achievement: 01/31/20 Potential to Achieve Goals: Good    Frequency  One visit, Evaluation    Barriers to discharge        Co-evaluation               AM-PAC PT "6  Clicks" Mobility  Outcome Measure Help needed turning from your back to your side while in a flat bed without using bedrails?: A Little Help needed moving from lying on your back to sitting on the side of a flat bed without using bedrails?: A Little Help needed moving to and from a bed to a chair (including a wheelchair)?: A Little Help needed standing up from a chair using your arms (e.g., wheelchair or bedside chair)?: A Little Help needed to walk in hospital room?: A Little Help needed climbing 3-5 steps with a railing? : A Lot 6 Click Score: 17    End of Session   Activity Tolerance: Patient tolerated treatment well Patient left: in chair;with call bell/phone within reach Nurse Communication: Mobility status PT Visit Diagnosis: Unsteadiness on feet (R26.81);Other abnormalities of gait and mobility (R26.89);Muscle weakness (generalized) (M62.81)    Time: 8563-1497 PT Time Calculation (min) (ACUTE ONLY): 10 min   Charges:   PT Evaluation $PT Eval Low Complexity: 1 Low PT Treatments $Therapeutic Activity: 8-22 mins        9:20 AM,01/17/20 Domenic Moras, PT, DPT Physical Therapist at [facility]

## 2020-01-17 NOTE — Discharge Summary (Signed)
Physician Discharge Summary  Briana Garrison IZT:[id number] DOB: [birth year] DOA: 01/16/2020  PCP: Briana Pretty, MD  Admit date: 01/16/2020 Discharge date: 01/17/2020  Admitted From: Home Disposition:  Home   Recommendations for Outpatient Follow-up:  1. Follow up with PCP in 1-2 weeks 2. Please obtain BMP/CBC in one week     Discharge Condition: Stable CODE STATUS: FULL Diet recommendation: Heart Healthy / Carb Modified    Brief/Interim Summary:  69 y.o. female with medical history of diabetes mellitus type 2, hypertension, hyperlipidemia, small vessel strokes presenting with slurred speech that was noted after she woke up on the morning of 01/16/2020.  The patient was last seen normal around 10 PM when she went to bed on the evening of 01/15/2020.  Apparently, the patient was getting ready for breakfast when her daughter noticed the patient to have some slurred speech and right facial droop.  The patient herself did not feel that her speech was slurred.  Nevertheless, the patient denied any visual disturbance, headache, focal extremity weakness, syncope, dizziness.  The patient was recently admitted to the hospital at [facility] from 12/05/2019 to 12/08/2019 for an acute ischemic stroke involving the right parietal occipital and insular area.  Patient was sent home with aspirin 81 mg and Plavix 75 mg dual therapy for 3 months, then Plavix alone.  She endorses compliance with all her medications.  She denies any fevers, chills, headache, chest pain, stress breath, coughing, hemoptysis, nausea, vomiting, diarrhea, domino pain. In the emergency department, the patient was afebrile hemodynamically stable with oxygen saturation 99 to 100% on room air.  BMP and LFTs were unremarkable.  WBC 6.4, hemoglobin 11.4, platelets 261,000.  CT of the brain was negative for acute findings.  Neurology was consulted to assist with management.  They recommended increasing ASA to 325 mg with Plavix x 2 more  months.  Discharge Diagnoses:  Slurred Speech/TIA -Neurology Consult appreciated--ASA 325 mg +Plavix 75 mg x 3 months, then plavix monotherapy -PT/OT evaluation--no follow up needed -Speech therapy eval -CT brain--neg for acute -MRI brain--no acute infarct -MRA brain--advanced intracranial atherosclerotic disease  -Carotid Duplex--not done as pt had recent MRA neck -12/06/19 Echo--EF 60-65%, no WMA, G1DD -12/06/19 LDL--49 -01/17/20 LDL 24 -HbA1C--pending at time of d/c -12/06/19 A1C--8.4 -Antiplatelet--ASA 81 mg+plavix>>>ASA 325 mg +Plavix 75 mg x 3 months, then plavix monotherapy  Uncontrolled diabetes mellitus type 2 with hyperglycemia -12/06/2019 hemoglobin A1c 8.4 -Start reduced dose Lantus during hospitalization -NovoLog sliding scale -Holding Trulicity--restart after ed/c  CKD 4 -baseline creatinine 1.8-2.1 -serum creatinine 1.84 on day of d/c  Essential hypertension -Holding telmisartan, carvedilol, clonidine to allow for permissive hypertension--restart after d/c -Hydralazine PRN SBP>220  Hyperlipidemia -Continue Crestor  Diabetic polyneuropathy -Continue gabapentin   Discharge Instructions   Allergies as of 01/17/2020      Reactions   Amlodipine Swelling   Tramadol Diarrhea, Nausea And Vomiting      Medication List    TAKE these medications   acetaminophen 500 MG tablet Commonly known as: TYLENOL Take 500 mg by mouth every 6 (six) hours as needed for mild pain or headache.   aspirin 325 MG EC tablet Take 1 tablet (325 mg total) by mouth daily. X 90 days   atorvastatin 80 MG tablet Commonly known as: LIPITOR Take 1 tablet (80 mg total) by mouth daily.   carvedilol 25 MG tablet Commonly known as: COREG Take 25 mg by mouth 2 (two) times daily.   cetirizine 10 MG tablet Commonly known as: ZYRTEC Take  10 mg by mouth daily as needed for allergies.   cloNIDine 0.1 MG tablet Commonly known as: CATAPRES Take 0.2 mg by mouth 2 (two) times  daily.   clopidogrel 75 MG tablet Commonly known as: PLAVIX Take 1 tablet (75 mg total) by mouth daily.   furosemide 20 MG tablet Commonly known as: LASIX Take 40 mg by mouth 2 (two) times daily.   gabapentin 100 MG capsule Commonly known as: NEURONTIN Take 100 mg by mouth daily as needed (shoulder pain).   glucose 4 GM chewable tablet Chew 1 tablet by mouth once as needed for low blood sugar.   insulin glargine 100 unit/mL Sopn Commonly known as: LANTUS Inject 18 Units into the skin at bedtime.   NovoLOG FlexPen 100 UNIT/ML FlexPen Generic drug: insulin aspart Inject into the skin 3 (three) times daily with meals. Per sliding scale - based on CBG   ProAir HFA 108 (90 Base) MCG/ACT inhaler Generic drug: albuterol Inhale 2 puffs into the lungs every 6 (six) hours as needed for wheezing or shortness of breath.   senna-docusate 8.6-50 MG tablet Commonly known as: Senokot-S Take 1 tablet by mouth daily as needed for mild constipation.   telmisartan 20 MG tablet Commonly known as: MICARDIS Take 20 mg by mouth daily.   Trulicity 1.5 TM/1.9QQ Sopn Generic drug: Dulaglutide Inject 1.5 mg into the skin every Tuesday.   Trulicity 3 IW/9.7LG Sopn Generic drug: Dulaglutide Inject 3 mg into the skin every Tuesday.   vitamin B-12 1000 MCG tablet Commonly known as: CYANOCOBALAMIN Take 1,000 mcg by mouth daily.   Vitamin D-3 25 MCG (1000 UT) Caps Take 1,000 Units by mouth daily.       Allergies  Allergen Reactions  . Amlodipine Swelling  . Tramadol Diarrhea and Nausea And Vomiting    Consultations:  neurology   Procedures/Studies: DG Chest 2 View  Result Date: 01/16/2020 CLINICAL DATA:  Stroke EXAM: CHEST - 2 VIEW COMPARISON:  06/21/2019 FINDINGS: Chronic elevation of the right hemidiaphragm. No new consolidation or edema. No pleural effusion. Stable cardiomediastinal contours are within normal limits. No acute osseous abnormality. IMPRESSION: No acute process in  the chest. Electronically Signed   By: Macy Mis M.D.   On: 01/16/2020 19:00   CT HEAD WO CONTRAST  Result Date: 01/16/2020 CLINICAL DATA:  Transient ischemic attack. Slurred speech and possible facial droop. EXAM: CT HEAD WITHOUT CONTRAST TECHNIQUE: Contiguous axial images were obtained from the base of the skull through the vertex without intravenous contrast. COMPARISON:  CT head December 05, 2019.  MRI November 8, 21. FINDINGS: Brain: No evidence of acute large vascular territory infarction, hemorrhage, hydrocephalus, extra-axial collection or mass lesion/mass effect. Remote lacunar infarcts in bilateral thalami. Patchy white matter hypoattenuation, most likely related to chronic microvascular ischemic disease. Chronic lacunar infarct in the brainstem, which is not well visualized on this study. Vascular: Calcific atherosclerosis. No hyperdense vessel identified. Skull: No acute fracture. Sinuses/Orbits: Partially imaged chronic right maxillary sinus opacification with maxillary sinus wall thickening. Retention cyst in the left sphenoid sinus. Other: No mastoid effusions. IMPRESSION: 1. No evidence of acute intracranial abnormality. 2. Remote lacunar infarcts in the brainstem and bilateral thalami. 3. Chronic microvascular ischemic disease. 4. Partially imaged findings suggestive of chronic right maxillary sinusitis. Electronically Signed   By: Margaretha Sheffield MD   On: 01/16/2020 13:17   MR ANGIO HEAD WO CONTRAST  Result Date: 01/16/2020 CLINICAL DATA:  Stroke, follow-up. Additional history provided: Altered mental status for 2 days. EXAM:  MRI HEAD WITHOUT CONTRAST MRA HEAD WITHOUT CONTRAST TECHNIQUE: Multiplanar, multiecho pulse sequences of the brain and surrounding structures were obtained without intravenous contrast. Angiographic images of the head were obtained using MRA technique without contrast. COMPARISON:  MRI/MRA head 12/05/2019. FINDINGS: MRI HEAD FINDINGS Brain: Moderate cerebral  atrophy.  Comparatively mild cerebellar atrophy. Residual diffusion weighted hyperintensity at site of known patchy late subacute cortical/subcortical infarcts within the cortical/subcortical right parietooccipital lobes (acute at time of prior brain MRI 12/05/2019. A known small subacute infarct within the right insula was better appreciated on the prior MRI. No acute ischemic infarct is identified. Redemonstrated chronic lacunar infarcts within the corpus callosum bilateral basal ganglia, thalami, pons and right middle cerebellar peduncle. Stable background moderate/advanced multifocal T2/FLAIR hyperintensity within the cerebral white matter and pons which is nonspecific, but compatible with chronic small vessel ischemic disease. Redemonstrated chronic microhemorrhage within the medial left temporal lobe. No evidence of intracranial mass. No extra-axial fluid collection. No midline shift. Vascular: Reported below. Skull and upper cervical spine: No focal marrow lesion. Sinuses/Orbits: Visualized orbits show no acute finding. Paranasal sinus mucosal thickening. Most notably, there is moderate mucosal thickening within the right greater than left maxillary sinuses. Small left sphenoid sinus mucous retention cyst. MRA HEAD FINDINGS The intracranial internal carotid arteries are patent. Redemonstrated moderate/severe stenosis of the distal M1 right middle cerebral artery (series 1036, image 9). Redemonstrated moderate stenosis within the superior division proximal M2 right MCA branch (series 1036, image 4). Redemonstrated high-grade stenosis within the inferior division proximal M2 right MCA branch (series 1036, image 16). The M1 left middle cerebral artery is patent without significant stenosis. Redemonstrated severe focal stenosis within the superior division proximal to mid left M2 MCA branch (series 1036, image 4). Redemonstrated moderate/severe stenosis within an inferior division distal M2 left MCA branch  (series 1036, image 4). The anterior cerebral arteries are patent. Redemonstrated severe stenosis within the distal left A2 segment (series 1036, image 8). Unchanged from the prior exam, the intracranial right vertebral artery is non-dominant and irregular but patent to the basilar. The dominant left vertebral artery is patent without significant stenosis. The basilar artery is patent. The posterior cerebral arteries are patent with atherosclerotic irregularity bilaterally. Most notably, there is a redemonstrated moderate stenosis within the proximal P2 left PCA, and sites of severe stenosis within the right PCA at the P2/P3 junction. Posterior communicating arteries are hypoplastic or absent bilaterally. No intracranial aneurysm is identified. IMPRESSION: MRI brain: 1. No evidence of acute intracranial abnormality. 2. Known small late subacute infarcts in the cortical/subcortical right parietooccipital lobes and right insula (acute at time of prior brain MRI 12/05/2019). 3. Redemonstrated chronic lacunar infarcts within corpus callosum, bilateral basal ganglia, thalami, pons and right middle cerebellar peduncle. 4. Stable background moderate/severe chronic small vessel ischemic disease within the cerebral white matter and pons. 5. Moderate cerebral atrophy. 6. Unchanged chronic paranasal sinus disease, most notably maxillary. MRA head: 1. No intracranial large vessel occlusion. 2. Stable, advanced intracranial atherosclerotic disease with multifocal stenoses, most notably as follows. 3. Moderate/severe stenosis within the distal M1 right MCA. 4. Sites of severe stenosis within bilateral proximal M2 MCA branches. 5. Moderate/severe stenosis within a distal M2 left MCA branch. 6. Severe stenosis within the distal A2 left ACA. 7. Moderate stenosis within the P2 left PCA. 8. Severe stenosis within the right PCA at the P2/P3 junction. Electronically Signed   By: Kellie Simmering DO   On: 01/16/2020 16:15   MR BRAIN WO  CONTRAST  Result Date: 01/16/2020 CLINICAL DATA:  Stroke, follow-up. Additional history provided: Altered mental status for 2 days. EXAM: MRI HEAD WITHOUT CONTRAST MRA HEAD WITHOUT CONTRAST TECHNIQUE: Multiplanar, multiecho pulse sequences of the brain and surrounding structures were obtained without intravenous contrast. Angiographic images of the head were obtained using MRA technique without contrast. COMPARISON:  MRI/MRA head 12/05/2019. FINDINGS: MRI HEAD FINDINGS Brain: Moderate cerebral atrophy.  Comparatively mild cerebellar atrophy. Residual diffusion weighted hyperintensity at site of known patchy late subacute cortical/subcortical infarcts within the cortical/subcortical right parietooccipital lobes (acute at time of prior brain MRI 12/05/2019. A known small subacute infarct within the right insula was better appreciated on the prior MRI. No acute ischemic infarct is identified. Redemonstrated chronic lacunar infarcts within the corpus callosum bilateral basal ganglia, thalami, pons and right middle cerebellar peduncle. Stable background moderate/advanced multifocal T2/FLAIR hyperintensity within the cerebral white matter and pons which is nonspecific, but compatible with chronic small vessel ischemic disease. Redemonstrated chronic microhemorrhage within the medial left temporal lobe. No evidence of intracranial mass. No extra-axial fluid collection. No midline shift. Vascular: Reported below. Skull and upper cervical spine: No focal marrow lesion. Sinuses/Orbits: Visualized orbits show no acute finding. Paranasal sinus mucosal thickening. Most notably, there is moderate mucosal thickening within the right greater than left maxillary sinuses. Small left sphenoid sinus mucous retention cyst. MRA HEAD FINDINGS The intracranial internal carotid arteries are patent. Redemonstrated moderate/severe stenosis of the distal M1 right middle cerebral artery (series 1036, image 9). Redemonstrated moderate  stenosis within the superior division proximal M2 right MCA branch (series 1036, image 4). Redemonstrated high-grade stenosis within the inferior division proximal M2 right MCA branch (series 1036, image 16). The M1 left middle cerebral artery is patent without significant stenosis. Redemonstrated severe focal stenosis within the superior division proximal to mid left M2 MCA branch (series 1036, image 4). Redemonstrated moderate/severe stenosis within an inferior division distal M2 left MCA branch (series 1036, image 4). The anterior cerebral arteries are patent. Redemonstrated severe stenosis within the distal left A2 segment (series 1036, image 8). Unchanged from the prior exam, the intracranial right vertebral artery is non-dominant and irregular but patent to the basilar. The dominant left vertebral artery is patent without significant stenosis. The basilar artery is patent. The posterior cerebral arteries are patent with atherosclerotic irregularity bilaterally. Most notably, there is a redemonstrated moderate stenosis within the proximal P2 left PCA, and sites of severe stenosis within the right PCA at the P2/P3 junction. Posterior communicating arteries are hypoplastic or absent bilaterally. No intracranial aneurysm is identified. IMPRESSION: MRI brain: 1. No evidence of acute intracranial abnormality. 2. Known small late subacute infarcts in the cortical/subcortical right parietooccipital lobes and right insula (acute at time of prior brain MRI 12/05/2019). 3. Redemonstrated chronic lacunar infarcts within corpus callosum, bilateral basal ganglia, thalami, pons and right middle cerebellar peduncle. 4. Stable background moderate/severe chronic small vessel ischemic disease within the cerebral white matter and pons. 5. Moderate cerebral atrophy. 6. Unchanged chronic paranasal sinus disease, most notably maxillary. MRA head: 1. No intracranial large vessel occlusion. 2. Stable, advanced intracranial  atherosclerotic disease with multifocal stenoses, most notably as follows. 3. Moderate/severe stenosis within the distal M1 right MCA. 4. Sites of severe stenosis within bilateral proximal M2 MCA branches. 5. Moderate/severe stenosis within a distal M2 left MCA branch. 6. Severe stenosis within the distal A2 left ACA. 7. Moderate stenosis within the P2 left PCA. 8. Severe stenosis within the right PCA at the P2/P3 junction. Electronically Signed  By: Kellie Simmering DO   On: 01/16/2020 16:15        Discharge Exam: Vitals:   01/17/20 0358 01/17/20 0612  BP: (!) 145/79 (!) 160/87  Pulse: 75 74  Resp: 20 20  Temp: 98.4 F (36.9 C) 98.2 F (36.8 C)  SpO2: 100% 100%   Vitals:   01/17/20 0011 01/17/20 0203 01/17/20 0358 01/17/20 0612  BP: 139/71 136/79 (!) 145/79 (!) 160/87  Pulse: 74 74 75 74  Resp: 20 20 20 20   Temp: 98.3 F (36.8 C) 98.3 F (36.8 C) 98.4 F (36.9 C) 98.2 F (36.8 C)  TempSrc: Oral Oral Oral Oral  SpO2: 100% 100% 100% 100%  Weight:      Height:        General: Pt is alert, awake, not in acute distress Cardiovascular: RRR, S1/S2 +, no rubs, no gallops Respiratory: CTA bilaterally, no wheezing, no rhonchi Abdominal: Soft, NT, ND, bowel sounds + Extremities: no edema, no cyanosis   The results of significant diagnostics from this hospitalization (including imaging, microbiology, ancillary and laboratory) are listed below for reference.    Significant Diagnostic Studies: DG Chest 2 View  Result Date: 01/16/2020 CLINICAL DATA:  Stroke EXAM: CHEST - 2 VIEW COMPARISON:  06/21/2019 FINDINGS: Chronic elevation of the right hemidiaphragm. No new consolidation or edema. No pleural effusion. Stable cardiomediastinal contours are within normal limits. No acute osseous abnormality. IMPRESSION: No acute process in the chest. Electronically Signed   By: Macy Mis M.D.   On: 01/16/2020 19:00   CT HEAD WO CONTRAST  Result Date: 01/16/2020 CLINICAL DATA:  Transient  ischemic attack. Slurred speech and possible facial droop. EXAM: CT HEAD WITHOUT CONTRAST TECHNIQUE: Contiguous axial images were obtained from the base of the skull through the vertex without intravenous contrast. COMPARISON:  CT head December 05, 2019.  MRI November 8, 21. FINDINGS: Brain: No evidence of acute large vascular territory infarction, hemorrhage, hydrocephalus, extra-axial collection or mass lesion/mass effect. Remote lacunar infarcts in bilateral thalami. Patchy white matter hypoattenuation, most likely related to chronic microvascular ischemic disease. Chronic lacunar infarct in the brainstem, which is not well visualized on this study. Vascular: Calcific atherosclerosis. No hyperdense vessel identified. Skull: No acute fracture. Sinuses/Orbits: Partially imaged chronic right maxillary sinus opacification with maxillary sinus wall thickening. Retention cyst in the left sphenoid sinus. Other: No mastoid effusions. IMPRESSION: 1. No evidence of acute intracranial abnormality. 2. Remote lacunar infarcts in the brainstem and bilateral thalami. 3. Chronic microvascular ischemic disease. 4. Partially imaged findings suggestive of chronic right maxillary sinusitis. Electronically Signed   By: Margaretha Sheffield MD   On: 01/16/2020 13:17   MR ANGIO HEAD WO CONTRAST  Result Date: 01/16/2020 CLINICAL DATA:  Stroke, follow-up. Additional history provided: Altered mental status for 2 days. EXAM: MRI HEAD WITHOUT CONTRAST MRA HEAD WITHOUT CONTRAST TECHNIQUE: Multiplanar, multiecho pulse sequences of the brain and surrounding structures were obtained without intravenous contrast. Angiographic images of the head were obtained using MRA technique without contrast. COMPARISON:  MRI/MRA head 12/05/2019. FINDINGS: MRI HEAD FINDINGS Brain: Moderate cerebral atrophy.  Comparatively mild cerebellar atrophy. Residual diffusion weighted hyperintensity at site of known patchy late subacute cortical/subcortical infarcts  within the cortical/subcortical right parietooccipital lobes (acute at time of prior brain MRI 12/05/2019. A known small subacute infarct within the right insula was better appreciated on the prior MRI. No acute ischemic infarct is identified. Redemonstrated chronic lacunar infarcts within the corpus callosum bilateral basal ganglia, thalami, pons and right middle cerebellar peduncle.  Stable background moderate/advanced multifocal T2/FLAIR hyperintensity within the cerebral white matter and pons which is nonspecific, but compatible with chronic small vessel ischemic disease. Redemonstrated chronic microhemorrhage within the medial left temporal lobe. No evidence of intracranial mass. No extra-axial fluid collection. No midline shift. Vascular: Reported below. Skull and upper cervical spine: No focal marrow lesion. Sinuses/Orbits: Visualized orbits show no acute finding. Paranasal sinus mucosal thickening. Most notably, there is moderate mucosal thickening within the right greater than left maxillary sinuses. Small left sphenoid sinus mucous retention cyst. MRA HEAD FINDINGS The intracranial internal carotid arteries are patent. Redemonstrated moderate/severe stenosis of the distal M1 right middle cerebral artery (series 1036, image 9). Redemonstrated moderate stenosis within the superior division proximal M2 right MCA branch (series 1036, image 4). Redemonstrated high-grade stenosis within the inferior division proximal M2 right MCA branch (series 1036, image 16). The M1 left middle cerebral artery is patent without significant stenosis. Redemonstrated severe focal stenosis within the superior division proximal to mid left M2 MCA branch (series 1036, image 4). Redemonstrated moderate/severe stenosis within an inferior division distal M2 left MCA branch (series 1036, image 4). The anterior cerebral arteries are patent. Redemonstrated severe stenosis within the distal left A2 segment (series 1036, image 8). Unchanged  from the prior exam, the intracranial right vertebral artery is non-dominant and irregular but patent to the basilar. The dominant left vertebral artery is patent without significant stenosis. The basilar artery is patent. The posterior cerebral arteries are patent with atherosclerotic irregularity bilaterally. Most notably, there is a redemonstrated moderate stenosis within the proximal P2 left PCA, and sites of severe stenosis within the right PCA at the P2/P3 junction. Posterior communicating arteries are hypoplastic or absent bilaterally. No intracranial aneurysm is identified. IMPRESSION: MRI brain: 1. No evidence of acute intracranial abnormality. 2. Known small late subacute infarcts in the cortical/subcortical right parietooccipital lobes and right insula (acute at time of prior brain MRI 12/05/2019). 3. Redemonstrated chronic lacunar infarcts within corpus callosum, bilateral basal ganglia, thalami, pons and right middle cerebellar peduncle. 4. Stable background moderate/severe chronic small vessel ischemic disease within the cerebral white matter and pons. 5. Moderate cerebral atrophy. 6. Unchanged chronic paranasal sinus disease, most notably maxillary. MRA head: 1. No intracranial large vessel occlusion. 2. Stable, advanced intracranial atherosclerotic disease with multifocal stenoses, most notably as follows. 3. Moderate/severe stenosis within the distal M1 right MCA. 4. Sites of severe stenosis within bilateral proximal M2 MCA branches. 5. Moderate/severe stenosis within a distal M2 left MCA branch. 6. Severe stenosis within the distal A2 left ACA. 7. Moderate stenosis within the P2 left PCA. 8. Severe stenosis within the right PCA at the P2/P3 junction. Electronically Signed   By: Kellie Simmering DO   On: 01/16/2020 16:15   MR BRAIN WO CONTRAST  Result Date: 01/16/2020 CLINICAL DATA:  Stroke, follow-up. Additional history provided: Altered mental status for 2 days. EXAM: MRI HEAD WITHOUT CONTRAST  MRA HEAD WITHOUT CONTRAST TECHNIQUE: Multiplanar, multiecho pulse sequences of the brain and surrounding structures were obtained without intravenous contrast. Angiographic images of the head were obtained using MRA technique without contrast. COMPARISON:  MRI/MRA head 12/05/2019. FINDINGS: MRI HEAD FINDINGS Brain: Moderate cerebral atrophy.  Comparatively mild cerebellar atrophy. Residual diffusion weighted hyperintensity at site of known patchy late subacute cortical/subcortical infarcts within the cortical/subcortical right parietooccipital lobes (acute at time of prior brain MRI 12/05/2019. A known small subacute infarct within the right insula was better appreciated on the prior MRI. No acute ischemic infarct is identified. Redemonstrated  chronic lacunar infarcts within the corpus callosum bilateral basal ganglia, thalami, pons and right middle cerebellar peduncle. Stable background moderate/advanced multifocal T2/FLAIR hyperintensity within the cerebral white matter and pons which is nonspecific, but compatible with chronic small vessel ischemic disease. Redemonstrated chronic microhemorrhage within the medial left temporal lobe. No evidence of intracranial mass. No extra-axial fluid collection. No midline shift. Vascular: Reported below. Skull and upper cervical spine: No focal marrow lesion. Sinuses/Orbits: Visualized orbits show no acute finding. Paranasal sinus mucosal thickening. Most notably, there is moderate mucosal thickening within the right greater than left maxillary sinuses. Small left sphenoid sinus mucous retention cyst. MRA HEAD FINDINGS The intracranial internal carotid arteries are patent. Redemonstrated moderate/severe stenosis of the distal M1 right middle cerebral artery (series 1036, image 9). Redemonstrated moderate stenosis within the superior division proximal M2 right MCA branch (series 1036, image 4). Redemonstrated high-grade stenosis within the inferior division proximal M2 right  MCA branch (series 1036, image 16). The M1 left middle cerebral artery is patent without significant stenosis. Redemonstrated severe focal stenosis within the superior division proximal to mid left M2 MCA branch (series 1036, image 4). Redemonstrated moderate/severe stenosis within an inferior division distal M2 left MCA branch (series 1036, image 4). The anterior cerebral arteries are patent. Redemonstrated severe stenosis within the distal left A2 segment (series 1036, image 8). Unchanged from the prior exam, the intracranial right vertebral artery is non-dominant and irregular but patent to the basilar. The dominant left vertebral artery is patent without significant stenosis. The basilar artery is patent. The posterior cerebral arteries are patent with atherosclerotic irregularity bilaterally. Most notably, there is a redemonstrated moderate stenosis within the proximal P2 left PCA, and sites of severe stenosis within the right PCA at the P2/P3 junction. Posterior communicating arteries are hypoplastic or absent bilaterally. No intracranial aneurysm is identified. IMPRESSION: MRI brain: 1. No evidence of acute intracranial abnormality. 2. Known small late subacute infarcts in the cortical/subcortical right parietooccipital lobes and right insula (acute at time of prior brain MRI 12/05/2019). 3. Redemonstrated chronic lacunar infarcts within corpus callosum, bilateral basal ganglia, thalami, pons and right middle cerebellar peduncle. 4. Stable background moderate/severe chronic small vessel ischemic disease within the cerebral white matter and pons. 5. Moderate cerebral atrophy. 6. Unchanged chronic paranasal sinus disease, most notably maxillary. MRA head: 1. No intracranial large vessel occlusion. 2. Stable, advanced intracranial atherosclerotic disease with multifocal stenoses, most notably as follows. 3. Moderate/severe stenosis within the distal M1 right MCA. 4. Sites of severe stenosis within bilateral  proximal M2 MCA branches. 5. Moderate/severe stenosis within a distal M2 left MCA branch. 6. Severe stenosis within the distal A2 left ACA. 7. Moderate stenosis within the P2 left PCA. 8. Severe stenosis within the right PCA at the P2/P3 junction. Electronically Signed   By: Kellie Simmering DO   On: 01/16/2020 16:15     Microbiology: Recent Results (from the past 240 hour(s))  Resp Panel by RT-PCR (Flu A&B, Covid) Nasopharyngeal Swab     Status: None   Collection Time: 01/16/20 10:56 AM   Specimen: Nasopharyngeal Swab; Nasopharyngeal(NP) swabs in vial transport medium  Result Value Ref Range Status   SARS Coronavirus 2 by RT PCR NEGATIVE NEGATIVE Final    Comment: (NOTE) SARS-CoV-2 target nucleic acids are NOT DETECTED.  The SARS-CoV-2 RNA is generally detectable in upper respiratory specimens during the acute phase of infection. The lowest concentration of SARS-CoV-2 viral copies this assay can detect is 138 copies/mL. A negative result does not preclude  SARS-Cov-2 infection and should not be used as the sole basis for treatment or other patient management decisions. A negative result may occur with  improper specimen collection/handling, submission of specimen other than nasopharyngeal swab, presence of viral mutation(s) within the areas targeted by this assay, and inadequate number of viral copies(<138 copies/mL). A negative result must be combined with clinical observations, patient history, and epidemiological information. The expected result is Negative.  Fact Sheet for Patients:  EntrepreneurPulse.com.au  Fact Sheet for Healthcare Providers:  IncredibleEmployment.be  This test is no t yet approved or cleared by the [place] FDA and  has been authorized for detection and/or diagnosis of SARS-CoV-2 by FDA under an Emergency Use Authorization (EUA). This EUA will remain  in effect (meaning this test can be used) for the duration of  the COVID-19 declaration under Section 564(b)(1) of the Act, 21 U.S.C.section 360bbb-3(b)(1), unless the authorization is terminated  or revoked sooner.       Influenza A by PCR NEGATIVE NEGATIVE Final   Influenza B by PCR NEGATIVE NEGATIVE Final    Comment: (NOTE) The Xpert Xpress SARS-CoV-2/FLU/RSV plus assay is intended as an aid in the diagnosis of influenza from Nasopharyngeal swab specimens and should not be used as a sole basis for treatment. Nasal washings and aspirates are unacceptable for Xpert Xpress SARS-CoV-2/FLU/RSV testing.  Fact Sheet for Patients: EntrepreneurPulse.com.au  Fact Sheet for Healthcare Providers: IncredibleEmployment.be  This test is not yet approved or cleared by the [place] FDA and has been authorized for detection and/or diagnosis of SARS-CoV-2 by FDA under an Emergency Use Authorization (EUA). This EUA will remain in effect (meaning this test can be used) for the duration of the COVID-19 declaration under Section 564(b)(1) of the Act, 21 U.S.C. section 360bbb-3(b)(1), unless the authorization is terminated or revoked.  Performed at [facility], [address], [place], [place] [address]      Labs: Basic Metabolic Panel: Recent Labs  Lab 01/16/20 1022 01/17/20 0530  NA 137 135  K 4.2 4.0  CL 106 103  CO2 23 22  GLUCOSE 210* 190*  BUN 39* 38*  CREATININE 2.10* 1.84*  CALCIUM 9.6 9.5  MG  --  2.1   Liver Function Tests: Recent Labs  Lab 01/16/20 1022  AST 24  ALT 38  ALKPHOS 82  BILITOT 0.2*  PROT 7.4  ALBUMIN 3.9   No results for input(s): LIPASE, AMYLASE in the last 168 hours. No results for input(s): AMMONIA in the last 168 hours. CBC: Recent Labs  Lab 01/16/20 1022  WBC 6.4  NEUTROABS 3.8  HGB 11.4*  HCT 35.1*  MCV 88.9  PLT 261   Cardiac Enzymes: No results for input(s): CKTOTAL, CKMB, CKMBINDEX, TROPONINI in the last 168 hours. BNP: Invalid input(s):  POCBNP CBG: Recent Labs  Lab 01/16/20 1320 01/16/20 1638 01/16/20 1946 01/17/20 0742  GLUCAP 175* 133* 225* 175*    Time coordinating discharge:  36 minutes  Signed:  Orson Eva, DO [facility] Pager: [phone] 01/17/2020, 9:39 AM

## 2020-01-18 DIAGNOSIS — H66005 Acute suppurative otitis media without spontaneous rupture of ear drum, recurrent, left ear: Secondary | ICD-10-CM | POA: Diagnosis not present

## 2020-01-24 DIAGNOSIS — R531 Weakness: Secondary | ICD-10-CM | POA: Diagnosis not present

## 2020-01-24 DIAGNOSIS — Z723 Lack of physical exercise: Secondary | ICD-10-CM | POA: Diagnosis not present

## 2020-01-31 DIAGNOSIS — Z723 Lack of physical exercise: Secondary | ICD-10-CM | POA: Diagnosis not present

## 2020-01-31 DIAGNOSIS — R531 Weakness: Secondary | ICD-10-CM | POA: Diagnosis not present

## 2020-02-02 DIAGNOSIS — Z723 Lack of physical exercise: Secondary | ICD-10-CM | POA: Diagnosis not present

## 2020-02-02 DIAGNOSIS — R531 Weakness: Secondary | ICD-10-CM | POA: Diagnosis not present

## 2020-02-06 DIAGNOSIS — H9202 Otalgia, left ear: Secondary | ICD-10-CM | POA: Diagnosis not present

## 2020-02-16 DIAGNOSIS — E1165 Type 2 diabetes mellitus with hyperglycemia: Secondary | ICD-10-CM | POA: Diagnosis not present

## 2020-02-16 DIAGNOSIS — E1140 Type 2 diabetes mellitus with diabetic neuropathy, unspecified: Secondary | ICD-10-CM | POA: Diagnosis not present

## 2020-02-21 ENCOUNTER — Encounter: Payer: Self-pay | Admitting: Neurology

## 2020-02-21 ENCOUNTER — Other Ambulatory Visit: Payer: Self-pay

## 2020-02-21 ENCOUNTER — Ambulatory Visit: Payer: Medicare PPO | Admitting: Neurology

## 2020-02-21 VITALS — BP 124/59 | HR 77 | Ht 61.0 in | Wt 176.0 lb

## 2020-02-21 DIAGNOSIS — G459 Transient cerebral ischemic attack, unspecified: Secondary | ICD-10-CM | POA: Diagnosis not present

## 2020-02-21 DIAGNOSIS — R413 Other amnesia: Secondary | ICD-10-CM

## 2020-02-21 DIAGNOSIS — I669 Occlusion and stenosis of unspecified cerebral artery: Secondary | ICD-10-CM

## 2020-02-21 DIAGNOSIS — G3184 Mild cognitive impairment, so stated: Secondary | ICD-10-CM

## 2020-02-21 NOTE — Progress Notes (Signed)
[facility] [address] [place]. [place] [address] [phone]       OFFICE FOLLOW-UP NOTE  [name] Date of Birth:  04-30-1950 Medical Record Number:  [id number]   HPI: Briana Garrison is a 70 year old African-American lady seen today for initial office follow-up visit following hospital admission for stroke October 2021.  History is obtained from the patient, review of electronic medical records and I personally reviewed pertinent imaging films in PACS.  She has past medical history of diabetes, hypertension, gastroesophageal reflux disease, shingles, prior right thalamic stroke in May 2021 with mild residual left-sided numbness who presented on 12/05/2019 with sudden onset of slurred speech and left-sided weakness.  Symptoms fluctuated and she did not reach the hospital right away.  MRI scan showed patchy right parieto-occipital and right insular cortical and subcortical acute infarcts as well as multiple old bilateral basal ganglia, thalamic and pontine lacunar infarcts.  MRI of the brain showed severe distal right M1 and moderate to severe left anterior cerebral artery and PCAs posterior cerebral artery stenosis.  Carotid ultrasound showed no significant extracranial stenosis.  2D echo showed ejection fraction of 60 to 65% without cardiac source of embolism.  LDL cholesterol was 49 mg percent and hemoglobin A1c was 8.4.  She was started on dual antiplatelet therapy of aspirin Plavix for 3 months due to intracranial stenosis and discharge.  Patient subsequently was seen on 01/16/2020 for recurrent transient dysarthria by Dr. Merlene Laughter at [facility].  MRI scan of the brain at this time did not show an acute stroke.  Patient states which she is tolerating aspirin Plavix well without bruising or bleeding.  She was seen by me initially in May 2021 when she had a right thalamic lacunar stroke.  She enrolled to participate in the sleep smart study at that time she was  participating the sleep smart study and actually finished participation in November.  She complains of short-term memory difficulties and difficulty remembering recent information.  She however still quite independent and does most activities of daily living by herself.  She has had recent infection in the ear for which she was treated with steroids which has led to increasing her sugars and fastings sugar now ranges in the 200s.  She does have upcoming appointment next month to see endocrinologist Dr. Suzette Battiest.  She is able to ambulate with a 4 pronged cane and is careful and has not had any falls.  She is currently doing outpatient physical and occupational therapy.  She still she is still off balance but is learned to compensate with a cane.  ROS:   14 system review of systems is positive for imbalance, memory loss, ear infection, dysarthria, sleep apnea and all other systems negative  PMH:  Past Medical History:  Diagnosis Date  . Cancer of kidney ([place]) 05/14/2009  . Diabetes mellitus   . Endometrial mass   . GERD (gastroesophageal reflux disease)   . Hypercholesterolemia   . Hypertension   . Internal hemorrhoids    S/P colonoscopy 06/10/00 by Dr. Lajoyce Corners  . Shingles   . Stroke [facility]) 09/2019   2 strokes    Social History:  Social History   Socioeconomic History  . Marital status: Married    Spouse name: [name]  . Number of children: 2  . Years of education: 53  . Highest education level: Not on file  Occupational History  . Occupation: Retired Musician for school Engineer, manufacturing systems: RETIRED  Tobacco Use  . Smoking status: Never Smoker  . Smokeless tobacco: Never Used  Vaping Use  . Vaping Use: Never used  Substance and Sexual Activity  . Alcohol use: No  . Drug use: No  . Sexual activity: Not on file  Other Topics Concern  . Not on file  Social History Narrative   Lives with Husband   Right Handed   Drinks 1-2 cups caffeine daily   Social Determinants  of Health   Financial Resource Strain: Not on file  Food Insecurity: Not on file  Transportation Needs: Not on file  Physical Activity: Not on file  Stress: Not on file  Social Connections: Not on file  Intimate Partner Violence: Not on file    Medications:   Current Outpatient Medications on File Prior to Visit  Medication Sig Dispense Refill  . acetaminophen (TYLENOL) 500 MG tablet Take 500 mg by mouth every 6 (six) hours as needed for mild pain or headache.     . albuterol (VENTOLIN HFA) 108 (90 Base) MCG/ACT inhaler Inhale 2 puffs into the lungs every 6 (six) hours as needed for wheezing or shortness of breath.    Marland Kitchen aspirin EC 325 MG EC tablet Take 1 tablet (325 mg total) by mouth daily. X 90 days 90 tablet 0  . carvedilol (COREG) 25 MG tablet Take 25 mg by mouth 2 (two) times daily.    . cloNIDine (CATAPRES) 0.1 MG tablet Take 0.2 mg by mouth 2 (two) times daily.     . clopidogrel (PLAVIX) 75 MG tablet Take 1 tablet (75 mg total) by mouth daily. 90 tablet 3  . Dulaglutide (TRULICITY) 3 HY/8.6VH SOPN Inject 3 mg into the skin every Tuesday.    . furosemide (LASIX) 20 MG tablet Take 40 mg by mouth 2 (two) times daily.     Marland Kitchen gabapentin (NEURONTIN) 100 MG capsule Take 100 mg by mouth daily as needed (shoulder pain).    Marland Kitchen glucose 4 GM chewable tablet Chew 1 tablet by mouth once as needed for low blood sugar.    . insulin aspart (NOVOLOG FLEXPEN) 100 UNIT/ML FlexPen Inject into the skin 3 (three) times daily with meals. Per sliding scale - based on CBG    . insulin glargine (LANTUS) 100 unit/mL SOPN Inject 8-14 Units into the skin 2 (two) times daily. 8 units am 14 units bedtime    . senna-docusate (SENOKOT-S) 8.6-50 MG tablet Take 1 tablet by mouth daily as needed for mild constipation.    Marland Kitchen telmisartan (MICARDIS) 20 MG tablet Take 20 mg by mouth daily.     . vitamin B-12 (CYANOCOBALAMIN) 1000 MCG tablet Take 1,000 mcg by mouth daily.    Marland Kitchen atorvastatin (LIPITOR) 80 MG tablet Take 1  tablet (80 mg total) by mouth daily. 30 tablet 0  . cetirizine (ZYRTEC) 10 MG tablet Take 10 mg by mouth daily as needed for allergies.    . Cholecalciferol (VITAMIN D-3) 25 MCG (1000 UT) CAPS Take 1,000 Units by mouth daily.     . Dulaglutide 1.5 MG/0.5ML SOPN Inject 1.5 mg into the skin every Tuesday.      No current facility-administered medications on file prior to visit.    Allergies:   Allergies  Allergen Reactions  . Amlodipine Swelling  . Tramadol Diarrhea and Nausea And Vomiting    Physical Exam General: well developed, well nourished, seated, in no evident distress Head: head normocephalic and atraumatic.  Neck: supple with no carotid or supraclavicular bruits Cardiovascular: regular  rate and rhythm, no murmurs Musculoskeletal: no deformity Skin:  no rash/petichiae Vascular:  Normal pulses all extremities Vitals:   02/21/20 0846  BP: (!) 124/59  Pulse: 77   Neurologic Exam Mental Status: Awake and fully alert. Oriented to place and time. Recent and remote memory intact. Attention span, concentration and fund of knowledge appropriate. Mood and affect appropriate.  Diminished recall 0/3.  Able to name 10 animals which can walk on 4 legs.  Clock drawing 4/4. Cranial Nerves: Fundoscopic exam reveals sharp disc margins. Pupils equal, briskly reactive to light. Extraocular movements full without nystagmus. Visual fields full to confrontation. Hearing intact. Facial sensation intact. Face, tongue, palate moves normally and symmetrically.  Motor: Normal bulk and tone. Normal strength in all tested extremity muscles. Sensory.: intact to touch ,pinprick .position and vibratory sensation.  Coordination: Rapid alternating movements normal in all extremities. Finger-to-nose and heel-to-shin performed accurately bilaterally. Gait and Station: Arises from chair without difficulty. Stance is normal. Gait demonstrates slight broad-based and imbalance and unable to tandem walk without  difficulty.  Reflexes: 1+ and symmetric. Toes downgoing.   NIHSS  0 Modified Rankin 1   ASSESSMENT: 70 year old African-American lady with episode of TIA in December 2021 as well as right MCA branch infarct in October 2021 due to intracranial stenosis.  Vascular risk factors of hypertension, hyperlipidemia, diabetes and intracranial stenosis.  She has memory difficulties due to mild cognitive impairment.     PLAN: I had a long d/w patient about her recent stroke and TIA, , intracranial stenosis and post stroke memory loss and mild cognitive impairment risk for recurrent stroke/TIAs, personally independently reviewed imaging studies and stroke evaluation results and answered questions.Continue aspirin 81 mg daily and Plavix 75 mg daily for her intracranial stenosis for secondary stroke prevention and maintain strict control of hypertension with blood pressure goal below 130/90, diabetes with hemoglobin A1c goal below 6.5% and lipids with LDL cholesterol goal below 70 mg/dL. I also advised the patient to eat a healthy diet with plenty of whole grains, cereals, fruits and vegetables, exercise regularly and maintain ideal body weight I encouraged her to continue ongoing outpatient physical and occupational therapy.Marland Kitchengiven multivessel intracranial stenosis and mild cognitive and impairment will hold off on considering endovascular stenting at the present time.  Check memory panel labs, EEG.  I encouraged the patient to participate in mentally challenging activities like solving crossword puzzles, playing bridge and sodoku.  We also discussed memory compensation strategies.  Followup in the future with me in 3 months or call earlier if necessary. Greater than 50% of time during this prolonged 45 minute visit was spent on counseling,explanation of diagnosis TIA, memory loss and intracranial stenosis, planning of further management, discussion with patient and family and coordination of care Antony Contras,  MD Medical Director [facility] Pager: [phone] 02/21/2020 5:21 PM Note: This document was prepared with digital dictation and possible smart phrase technology. Any transcriptional errors that result from this process are unintentional

## 2020-02-21 NOTE — Patient Instructions (Signed)
I had a long d/w patient about her recent stroke and TIA, , intracranial stenosis and post stroke memory loss and mild cognitive impairment risk for recurrent stroke/TIAs, personally independently reviewed imaging studies and stroke evaluation results and answered questions.Continue aspirin 81 mg daily and Plavix 75 mg daily for her intracranial stenosis for secondary stroke prevention and maintain strict control of hypertension with blood pressure goal below 130/90, diabetes with hemoglobin A1c goal below 6.5% and lipids with LDL cholesterol goal below 70 mg/dL. I also advised the patient to eat a healthy diet with plenty of whole grains, cereals, fruits and vegetables, exercise regularly and maintain ideal body weight I encouraged her to continue ongoing outpatient physical and occupational therapy.Marland Kitchengiven multivessel intracranial stenosis and mild cognitive and impairment will hold off on considering endovascular stenting at the present time.  Check memory panel labs, EEG.  I encouraged the patient to participate in mentally challenging activities like solving crossword puzzles, playing bridge and sodoku.  We also discussed memory compensation strategies.  Followup in the future with me in 3 months or call earlier if necessary. Memory Compensation Strategies  1. Use "WARM" strategy.  W= write it down  A= associate it  R= repeat it  M= make a mental note  2.   You can keep a Social worker.  Use a 3-ring notebook with sections for the following: calendar, important names and phone numbers,  medications, doctors' names/phone numbers, lists/reminders, and a section to journal what you did  each day.   3.    Use a calendar to write appointments down.  4.    Write yourself a schedule for the day.  This can be placed on the calendar or in a separate section of the Memory Notebook.  Keeping a  regular schedule can help memory.  5.    Use medication organizer with sections for each day or morning/evening  pills.  You may need help loading it  6.    Keep a basket, or pegboard by the door.  Place items that you need to take out with you in the basket or on the pegboard.  You may also want to  include a message board for reminders.  7.    Use sticky notes.  Place sticky notes with reminders in a place where the task is performed.  For example: " turn off the  stove" placed by the stove, "lock the door" placed on the door at eye level, " take your medications" on  the bathroom mirror or by the place where you normally take your medications.  8.    Use alarms/timers.  Use while cooking to remind yourself to check on food or as a reminder to take your medicine, or as a  reminder to make a call, or as a reminder to perform another task, etc.

## 2020-02-28 ENCOUNTER — Ambulatory Visit: Payer: Medicare PPO | Admitting: Neurology

## 2020-02-28 DIAGNOSIS — R413 Other amnesia: Secondary | ICD-10-CM

## 2020-02-28 DIAGNOSIS — R410 Disorientation, unspecified: Secondary | ICD-10-CM | POA: Diagnosis not present

## 2020-03-06 NOTE — Progress Notes (Signed)
Kindly inform the patient that EEG study was normal

## 2020-03-07 ENCOUNTER — Telehealth: Payer: Self-pay | Admitting: *Deleted

## 2020-03-07 NOTE — Telephone Encounter (Signed)
I tried to call the patient to discuss EEG results. There was no answer and her VM was full. If the patient calls back, please let her know her EEG was normal we will see her on April 7 at 1 PM for her next appointment.  Garvin Fila, MD  03/06/2020 5:47 PM EST Back to Top     Kindly inform the patient that EEG study was normal

## 2020-03-08 ENCOUNTER — Ambulatory Visit: Payer: Self-pay | Admitting: Neurology

## 2020-03-14 NOTE — Telephone Encounter (Signed)
Spoke with patient and advised her of normal EEG, no concerns or seizure activity noted. Patient verbalized understanding and was very appreciative for the call.

## 2020-03-19 DIAGNOSIS — E1165 Type 2 diabetes mellitus with hyperglycemia: Secondary | ICD-10-CM | POA: Diagnosis not present

## 2020-03-19 DIAGNOSIS — Z723 Lack of physical exercise: Secondary | ICD-10-CM | POA: Diagnosis not present

## 2020-03-19 DIAGNOSIS — R531 Weakness: Secondary | ICD-10-CM | POA: Diagnosis not present

## 2020-03-27 DIAGNOSIS — Z723 Lack of physical exercise: Secondary | ICD-10-CM | POA: Diagnosis not present

## 2020-03-27 DIAGNOSIS — R531 Weakness: Secondary | ICD-10-CM | POA: Diagnosis not present

## 2020-03-29 DIAGNOSIS — I10 Essential (primary) hypertension: Secondary | ICD-10-CM | POA: Diagnosis not present

## 2020-03-29 DIAGNOSIS — E1165 Type 2 diabetes mellitus with hyperglycemia: Secondary | ICD-10-CM | POA: Diagnosis not present

## 2020-04-03 DIAGNOSIS — Z723 Lack of physical exercise: Secondary | ICD-10-CM | POA: Diagnosis not present

## 2020-04-03 DIAGNOSIS — R531 Weakness: Secondary | ICD-10-CM | POA: Diagnosis not present

## 2020-04-05 DIAGNOSIS — E875 Hyperkalemia: Secondary | ICD-10-CM | POA: Diagnosis not present

## 2020-04-05 DIAGNOSIS — Z6832 Body mass index (BMI) 32.0-32.9, adult: Secondary | ICD-10-CM | POA: Diagnosis not present

## 2020-04-05 DIAGNOSIS — Z0000 Encounter for general adult medical examination without abnormal findings: Secondary | ICD-10-CM | POA: Diagnosis not present

## 2020-04-05 DIAGNOSIS — D631 Anemia in chronic kidney disease: Secondary | ICD-10-CM | POA: Diagnosis not present

## 2020-04-05 DIAGNOSIS — I10 Essential (primary) hypertension: Secondary | ICD-10-CM | POA: Diagnosis not present

## 2020-04-05 DIAGNOSIS — E1165 Type 2 diabetes mellitus with hyperglycemia: Secondary | ICD-10-CM | POA: Diagnosis not present

## 2020-04-05 DIAGNOSIS — N184 Chronic kidney disease, stage 4 (severe): Secondary | ICD-10-CM | POA: Diagnosis not present

## 2020-04-05 DIAGNOSIS — Z01419 Encounter for gynecological examination (general) (routine) without abnormal findings: Secondary | ICD-10-CM | POA: Diagnosis not present

## 2020-04-05 DIAGNOSIS — N2581 Secondary hyperparathyroidism of renal origin: Secondary | ICD-10-CM | POA: Diagnosis not present

## 2020-04-05 DIAGNOSIS — E1122 Type 2 diabetes mellitus with diabetic chronic kidney disease: Secondary | ICD-10-CM | POA: Diagnosis not present

## 2020-04-05 DIAGNOSIS — Z1231 Encounter for screening mammogram for malignant neoplasm of breast: Secondary | ICD-10-CM | POA: Diagnosis not present

## 2020-04-05 DIAGNOSIS — E1140 Type 2 diabetes mellitus with diabetic neuropathy, unspecified: Secondary | ICD-10-CM | POA: Diagnosis not present

## 2020-04-10 DIAGNOSIS — R531 Weakness: Secondary | ICD-10-CM | POA: Diagnosis not present

## 2020-04-10 DIAGNOSIS — Z723 Lack of physical exercise: Secondary | ICD-10-CM | POA: Diagnosis not present

## 2020-04-23 DIAGNOSIS — E7800 Pure hypercholesterolemia, unspecified: Secondary | ICD-10-CM | POA: Diagnosis not present

## 2020-04-23 DIAGNOSIS — E1140 Type 2 diabetes mellitus with diabetic neuropathy, unspecified: Secondary | ICD-10-CM | POA: Diagnosis not present

## 2020-04-23 DIAGNOSIS — I10 Essential (primary) hypertension: Secondary | ICD-10-CM | POA: Diagnosis not present

## 2020-04-24 DIAGNOSIS — E1165 Type 2 diabetes mellitus with hyperglycemia: Secondary | ICD-10-CM | POA: Diagnosis not present

## 2020-05-03 ENCOUNTER — Ambulatory Visit: Payer: Medicare PPO | Admitting: Neurology

## 2020-05-15 DIAGNOSIS — E875 Hyperkalemia: Secondary | ICD-10-CM | POA: Diagnosis not present

## 2020-05-15 DIAGNOSIS — Z8673 Personal history of transient ischemic attack (TIA), and cerebral infarction without residual deficits: Secondary | ICD-10-CM | POA: Diagnosis not present

## 2020-05-15 DIAGNOSIS — E1122 Type 2 diabetes mellitus with diabetic chronic kidney disease: Secondary | ICD-10-CM | POA: Diagnosis not present

## 2020-05-15 DIAGNOSIS — I10 Essential (primary) hypertension: Secondary | ICD-10-CM | POA: Diagnosis not present

## 2020-05-29 DIAGNOSIS — E1165 Type 2 diabetes mellitus with hyperglycemia: Secondary | ICD-10-CM | POA: Diagnosis not present

## 2020-07-02 DIAGNOSIS — E1165 Type 2 diabetes mellitus with hyperglycemia: Secondary | ICD-10-CM | POA: Diagnosis not present

## 2020-07-11 ENCOUNTER — Encounter: Payer: Self-pay | Admitting: Neurology

## 2020-07-11 ENCOUNTER — Ambulatory Visit: Payer: Medicare PPO | Admitting: Neurology

## 2020-07-11 VITALS — BP 156/78 | HR 72 | Ht 61.0 in | Wt 182.4 lb

## 2020-07-11 DIAGNOSIS — R269 Unspecified abnormalities of gait and mobility: Secondary | ICD-10-CM | POA: Diagnosis not present

## 2020-07-11 DIAGNOSIS — Z8673 Personal history of transient ischemic attack (TIA), and cerebral infarction without residual deficits: Secondary | ICD-10-CM

## 2020-07-11 DIAGNOSIS — G3184 Mild cognitive impairment, so stated: Secondary | ICD-10-CM

## 2020-07-11 NOTE — Progress Notes (Signed)
[facility] [address] [place]. [place] [address] [phone]       OFFICE FOLLOW-UP NOTE  Briana Garrison Date of Birth:  02/14/1950 Medical Record Number:  [id number]   HPI: Initial visit 02/21/2020:Ms. Hornbaker is a 70 year old African-American lady seen today for initial office follow-up visit following hospital admission for stroke October 2021.  History is obtained from the patient, review of electronic medical records and I personally reviewed pertinent imaging films in PACS.  She has past medical history of diabetes, hypertension, gastroesophageal reflux disease, shingles, prior right thalamic stroke in May 2021 with mild residual left-sided numbness who presented on 12/05/2019 with sudden onset of slurred speech and left-sided weakness.  Symptoms fluctuated and she did not reach the hospital right away.  MRI scan showed patchy right parieto-occipital and right insular cortical and subcortical acute infarcts as well as multiple old bilateral basal ganglia, thalamic and pontine lacunar infarcts.  MRI of the brain showed severe distal right M1 and moderate to severe left anterior cerebral artery and PCAs posterior cerebral artery stenosis.  Carotid ultrasound showed no significant extracranial stenosis.  2D echo showed ejection fraction of 60 to 65% without cardiac source of embolism.  LDL cholesterol was 49 mg percent and hemoglobin A1c was 8.4.  She was started on dual antiplatelet therapy of aspirin Plavix for 3 months due to intracranial stenosis and discharge.  Patient subsequently was seen on 01/16/2020 for recurrent transient dysarthria by Dr. Merlene Laughter at [facility].  MRI scan of the brain at this time did not show an acute stroke.  Patient states which she is tolerating aspirin Plavix well without bruising or bleeding.  She was seen by me initially in May 2021 when she had a right thalamic lacunar stroke.  She enrolled to participate in the sleep smart study at  that time she was participating the sleep smart study and actually finished participation in November.  She complains of short-term memory difficulties and difficulty remembering recent information.  She however still quite independent and does most activities of daily living by herself.  She has had recent infection in the ear for which she was treated with steroids which has led to increasing her sugars and fastings sugar now ranges in the 200s.  She does have upcoming appointment next month to see endocrinologist Dr. Suzette Battiest.  She is able to ambulate with a 4 pronged cane and is careful and has not had any falls.  She is currently doing outpatient physical and occupational therapy.  She still she is still off balance but is learned to compensate with a cane. Update 07/11/2020: She returns for follow-up after last visit for an of months ago.  She is accompanied by daughter.  Patient has not had any recurrent TIA or stroke symptoms.  She is doing outpatient physical therapy which is helping her with her walking and balance.  She still occasionally stumbles and has only 1 minor fall.  She continues to have paresthesias in the left hand and leg which are intermittent and not bothersome.  Patient was advised to me to get memory panel labs done at last visit but for unclear reason has not been done.  EEG done on 03/06/2020 was normal.  She continues to have mild short-term memory difficulties with she has been participating in cognitively challenging activities like playing games on the computer that seems to help.  Patient admits she has not been very compliant with using her CPAP for his sleep apnea.  She has no new complaints. ROS:   14 system review of systems is positive for imbalance, memory loss, ear infection, dysarthria, sleep apnea and all other systems negative  PMH:  Past Medical History:  Diagnosis Date   Cancer of kidney ([facility]) 05/14/2009   Diabetes mellitus    Endometrial mass    GERD  (gastroesophageal reflux disease)    Hypercholesterolemia    Hypertension    Internal hemorrhoids    S/P colonoscopy 06/10/00 by Dr. 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    Employer: RETIRED  Tobacco Use   Smoking status: Never   Smokeless tobacco: Never  Vaping Use   Vaping Use: Never used  Substance and Sexual Activity   Alcohol use: No   Drug use: No   Sexual activity: Not on file  Other Topics Concern   Not on file  Social History Narrative   Lives with Husband and daughter   Right Handed   Drinks no caffeine daily   Social Determinants of Health   Financial Resource Strain: Not on file  Food Insecurity: Not on file  Transportation Needs: Not on file  Physical Activity: Not on file  Stress: Not on file  Social Connections: Not on file  Intimate Partner Violence: Not on file    Medications:   Current Outpatient Medications on File Prior to Visit  Medication Sig Dispense Refill   acetaminophen (TYLENOL) 500 MG tablet Take 500 mg by mouth every 6 (six) hours as needed for mild pain or headache.      albuterol (VENTOLIN HFA) 108 (90 Base) MCG/ACT inhaler Inhale 2 puffs into the lungs every 6 (six) hours as needed for wheezing or shortness of breath.     carvedilol (COREG) 25 MG tablet Take 25 mg by mouth 2 (two) times daily.     cetirizine (ZYRTEC) 10 MG tablet Take 10 mg by mouth daily as needed for allergies.     Cholecalciferol (VITAMIN D-3) 25 MCG (1000 UT) CAPS Take 1,000 Units by mouth daily.      cloNIDine (CATAPRES) 0.1 MG tablet Take 0.2 mg by mouth 2 (two) times daily.      clopidogrel (PLAVIX) 75 MG tablet Take 1 tablet (75 mg total) by mouth daily. 90  tablet 3   Dulaglutide (TRULICITY) 3 UX/3.2TF SOPN Inject 3 mg into the skin every Tuesday.     Dulaglutide 1.5 MG/0.5ML SOPN Inject 1.5 mg into the skin every Tuesday.      furosemide (LASIX) 20 MG tablet Take 40 mg by mouth 2 (two) times daily.      gabapentin (NEURONTIN) 100 MG capsule Take 100 mg by mouth daily as needed (shoulder pain).     glucose 4 GM chewable tablet Chew 1 tablet by mouth once as needed for low blood sugar.     insulin aspart (NOVOLOG FLEXPEN) 100 UNIT/ML FlexPen Inject into the skin 3 (three) times daily with meals. Per sliding scale - based on CBG     insulin glargine (LANTUS) 100 unit/mL SOPN Inject 8-14 Units into the skin 2 (two) times daily. 8 units am 14 units bedtime  senna-docusate (SENOKOT-S) 8.6-50 MG tablet Take 1 tablet by mouth daily as needed for mild constipation.     telmisartan (MICARDIS) 20 MG tablet Take 20 mg by mouth daily.      vitamin B-12 (CYANOCOBALAMIN) 1000 MCG tablet Take 1,000 mcg by mouth daily.     atorvastatin (LIPITOR) 80 MG tablet Take 1 tablet (80 mg total) by mouth daily. 30 tablet 0   No current facility-administered medications on file prior to visit.    Allergies:   Allergies  Allergen Reactions   Amlodipine Swelling   Tramadol Diarrhea and Nausea And Vomiting    Physical Exam General: well developed, well nourished elderly African-American lady, seated, in no evident distress Head: head normocephalic and atraumatic.  Neck: supple with no carotid or supraclavicular bruits Cardiovascular: regular rate and rhythm, no murmurs Musculoskeletal: no deformity Skin:  no rash/petichiae Vascular:  Normal pulses all extremities Vitals:   07/11/20 1349  BP: (!) 156/78  Pulse: 72   Neurologic Exam Mental Status: Awake and fully alert. Oriented to place and time. Recent and remote memory intact. Attention span, concentration and fund of knowledge appropriate. Mood and affect appropriate.  Diminished recall 1/3.  Able to name  8 animals which can walk on 4 legs.  Clock drawing 4/4. Cranial Nerves: Fundoscopic exam not done. Pupils equal, briskly reactive to light. Extraocular movements full without nystagmus. Visual fields full to confrontation. Hearing intact. Facial sensation intact. Face, tongue, palate moves normally and symmetrically.  Motor: Normal bulk and tone. Normal strength in all tested extremity muscles. Sensory.: intact to touch ,pinprick .position and vibratory sensation.  But mild subjective left upper and lower extremity paresthesias. Coordination: Rapid alternating movements normal in all extremities. Finger-to-nose and heel-to-shin performed accurately bilaterally. Gait and Station: Arises from chair without difficulty. Stance is normal. Gait demonstrates slight broad-based and imbalance and uses a 4 pronged cane. Reflexes: 1+ and symmetric. Toes downgoing.   NIHSS  0 Modified Rankin 1   ASSESSMENT: 70 year old African-American lady with episode of TIA in December 2021 as well as right MCA branch infarct in October 2021 due to intracranial stenosis.  Vascular risk factors of hypertension, hyperlipidemia, diabetes and intracranial stenosis.  She has memory difficulties due to mild cognitive impairment. Which appear stable.     PLAN: I had a long d/w patient and her daughter about her remote stroke, risk for recurrent stroke/TIAs, personally independently reviewed imaging studies and stroke evaluation results and answered questions.Continue clopidogrel 75 mg daily  for secondary stroke prevention  and stop aspirin now and maintain strict control of hypertension with blood pressure goal below 130/90, diabetes with hemoglobin A1c goal below 6.5% and lipids with LDL cholesterol goal below 70 mg/dL. I also advised the patient to eat a healthy diet with plenty of whole grains, cereals, fruits and vegetables, exercise regularly and maintain ideal body weight . She was counselled to use her CPAP mask every  night for her sleep apnoea.Follow up in the future with my nurse practitioner in 6 months or call earlier if needed. Greater than 50% of time during this prolonged 25 minute visit was spent on counseling,explanation of diagnosis TIA, memory loss and intracranial stenosis, planning of further management, discussion with patient and family and coordination of care Antony Contras, MD Medical Director [place] Pager: [phone] 07/11/2020 4:22 PM Note: This document was prepared with digital dictation and possible smart phrase technology. Any transcriptional errors that result from this process are unintentional

## 2020-07-11 NOTE — Patient Instructions (Signed)
I had a long d/w patient and her daughter about her remote stroke, risk for recurrent stroke/TIAs, personally independently reviewed imaging studies and stroke evaluation results and answered questions.Continue clopidogrel 75 mg daily  for secondary stroke prevention  and stop aspirin now and maintain strict control of hypertension with blood pressure goal below 130/90, diabetes with hemoglobin A1c goal below 6.5% and lipids with LDL cholesterol goal below 70 mg/dL. I also advised the patient to eat a healthy diet with plenty of whole grains, cereals, fruits and vegetables, exercise regularly and maintain ideal body weight . She was counselled to use her CPAP mask every night for her sleep apnoea.Follow up in the future with my nurse practitioner in 6 months or call earlier if needed.

## 2020-08-02 DIAGNOSIS — E1165 Type 2 diabetes mellitus with hyperglycemia: Secondary | ICD-10-CM | POA: Diagnosis not present

## 2020-08-08 ENCOUNTER — Telehealth: Payer: Self-pay | Admitting: Neurology

## 2020-08-08 DIAGNOSIS — I639 Cerebral infarction, unspecified: Secondary | ICD-10-CM

## 2020-08-08 DIAGNOSIS — R269 Unspecified abnormalities of gait and mobility: Secondary | ICD-10-CM

## 2020-08-08 NOTE — Telephone Encounter (Signed)
Hx of CVA. I called the patient back. Reports still having gait difficulty and issues using cane. She would like further PT sessions to help w/ gait training. Per vo by Dr. Leonie Man, okay to place order.

## 2020-08-08 NOTE — Telephone Encounter (Signed)
Pt has called back so Dr Leonie Man can be aware of where she'd like to have PT in [place]. : [facility] and their # is [phone]

## 2020-08-13 NOTE — Telephone Encounter (Signed)
Referral faxed to [facility] in [place] as requested by patient. Faxed referral to [phone].

## 2020-08-20 DIAGNOSIS — I639 Cerebral infarction, unspecified: Secondary | ICD-10-CM | POA: Diagnosis not present

## 2020-08-20 DIAGNOSIS — R269 Unspecified abnormalities of gait and mobility: Secondary | ICD-10-CM | POA: Diagnosis not present

## 2020-08-23 DIAGNOSIS — R269 Unspecified abnormalities of gait and mobility: Secondary | ICD-10-CM | POA: Diagnosis not present

## 2020-08-23 DIAGNOSIS — I639 Cerebral infarction, unspecified: Secondary | ICD-10-CM | POA: Diagnosis not present

## 2020-08-27 DIAGNOSIS — R269 Unspecified abnormalities of gait and mobility: Secondary | ICD-10-CM | POA: Diagnosis not present

## 2020-08-27 DIAGNOSIS — I639 Cerebral infarction, unspecified: Secondary | ICD-10-CM | POA: Diagnosis not present

## 2020-08-30 DIAGNOSIS — I639 Cerebral infarction, unspecified: Secondary | ICD-10-CM | POA: Diagnosis not present

## 2020-08-30 DIAGNOSIS — R269 Unspecified abnormalities of gait and mobility: Secondary | ICD-10-CM | POA: Diagnosis not present

## 2020-09-03 DIAGNOSIS — I639 Cerebral infarction, unspecified: Secondary | ICD-10-CM | POA: Diagnosis not present

## 2020-09-03 DIAGNOSIS — E1165 Type 2 diabetes mellitus with hyperglycemia: Secondary | ICD-10-CM | POA: Diagnosis not present

## 2020-09-03 DIAGNOSIS — R269 Unspecified abnormalities of gait and mobility: Secondary | ICD-10-CM | POA: Diagnosis not present

## 2020-09-06 DIAGNOSIS — R269 Unspecified abnormalities of gait and mobility: Secondary | ICD-10-CM | POA: Diagnosis not present

## 2020-09-06 DIAGNOSIS — I639 Cerebral infarction, unspecified: Secondary | ICD-10-CM | POA: Diagnosis not present

## 2020-09-11 DIAGNOSIS — R269 Unspecified abnormalities of gait and mobility: Secondary | ICD-10-CM | POA: Diagnosis not present

## 2020-09-11 DIAGNOSIS — I639 Cerebral infarction, unspecified: Secondary | ICD-10-CM | POA: Diagnosis not present

## 2020-09-14 DIAGNOSIS — R269 Unspecified abnormalities of gait and mobility: Secondary | ICD-10-CM | POA: Diagnosis not present

## 2020-09-14 DIAGNOSIS — I639 Cerebral infarction, unspecified: Secondary | ICD-10-CM | POA: Diagnosis not present

## 2020-09-17 DIAGNOSIS — I639 Cerebral infarction, unspecified: Secondary | ICD-10-CM | POA: Diagnosis not present

## 2020-09-17 DIAGNOSIS — R269 Unspecified abnormalities of gait and mobility: Secondary | ICD-10-CM | POA: Diagnosis not present

## 2020-10-03 DIAGNOSIS — N950 Postmenopausal bleeding: Secondary | ICD-10-CM | POA: Diagnosis not present

## 2020-10-03 DIAGNOSIS — Z124 Encounter for screening for malignant neoplasm of cervix: Secondary | ICD-10-CM | POA: Diagnosis not present

## 2020-10-04 DIAGNOSIS — E1165 Type 2 diabetes mellitus with hyperglycemia: Secondary | ICD-10-CM | POA: Diagnosis not present

## 2020-10-08 DIAGNOSIS — E1122 Type 2 diabetes mellitus with diabetic chronic kidney disease: Secondary | ICD-10-CM | POA: Diagnosis not present

## 2020-10-08 DIAGNOSIS — Z23 Encounter for immunization: Secondary | ICD-10-CM | POA: Diagnosis not present

## 2020-10-08 DIAGNOSIS — I10 Essential (primary) hypertension: Secondary | ICD-10-CM | POA: Diagnosis not present

## 2020-10-11 ENCOUNTER — Other Ambulatory Visit: Payer: Self-pay | Admitting: Neurology

## 2020-10-15 ENCOUNTER — Ambulatory Visit: Payer: Medicare PPO | Admitting: Neurology

## 2020-11-01 DIAGNOSIS — N950 Postmenopausal bleeding: Secondary | ICD-10-CM | POA: Diagnosis not present

## 2020-11-05 DIAGNOSIS — E1165 Type 2 diabetes mellitus with hyperglycemia: Secondary | ICD-10-CM | POA: Diagnosis not present

## 2020-11-06 DIAGNOSIS — I10 Essential (primary) hypertension: Secondary | ICD-10-CM | POA: Diagnosis not present

## 2020-11-06 DIAGNOSIS — E7800 Pure hypercholesterolemia, unspecified: Secondary | ICD-10-CM | POA: Diagnosis not present

## 2020-11-06 DIAGNOSIS — Z794 Long term (current) use of insulin: Secondary | ICD-10-CM | POA: Diagnosis not present

## 2020-11-06 DIAGNOSIS — E1122 Type 2 diabetes mellitus with diabetic chronic kidney disease: Secondary | ICD-10-CM | POA: Diagnosis not present

## 2020-11-06 DIAGNOSIS — N184 Chronic kidney disease, stage 4 (severe): Secondary | ICD-10-CM | POA: Diagnosis not present

## 2020-11-06 DIAGNOSIS — Z8673 Personal history of transient ischemic attack (TIA), and cerebral infarction without residual deficits: Secondary | ICD-10-CM | POA: Diagnosis not present

## 2020-11-07 ENCOUNTER — Encounter (HOSPITAL_COMMUNITY): Payer: Self-pay | Admitting: *Deleted

## 2020-11-07 ENCOUNTER — Other Ambulatory Visit: Payer: Self-pay

## 2020-11-07 ENCOUNTER — Emergency Department (HOSPITAL_COMMUNITY)
Admission: EM | Admit: 2020-11-07 | Discharge: 2020-11-07 | Disposition: A | Discharge status: Home / Self Care | Payer: Medicare PPO | Attending: Emergency Medicine | Admitting: Emergency Medicine

## 2020-11-07 DIAGNOSIS — Z5321 Procedure and treatment not carried out due to patient leaving prior to being seen by health care provider: Secondary | ICD-10-CM | POA: Diagnosis not present

## 2020-11-07 DIAGNOSIS — R040 Epistaxis: Secondary | ICD-10-CM | POA: Insufficient documentation

## 2020-11-07 NOTE — ED Notes (Signed)
Reported that pt left without being seen.

## 2020-11-07 NOTE — ED Triage Notes (Signed)
Pt with nosebleed since earlier today.  Denies taking any blood thinners.

## 2020-12-14 DIAGNOSIS — E1165 Type 2 diabetes mellitus with hyperglycemia: Secondary | ICD-10-CM | POA: Diagnosis not present

## 2021-01-03 ENCOUNTER — Observation Stay (HOSPITAL_COMMUNITY)
Admission: EM | Admit: 2021-01-03 | Discharge: 2021-01-04 | Disposition: A | Discharge status: Home / Self Care | Payer: Medicare PPO | Attending: Internal Medicine | Admitting: Internal Medicine

## 2021-01-03 ENCOUNTER — Encounter (HOSPITAL_COMMUNITY): Payer: Self-pay | Admitting: Emergency Medicine

## 2021-01-03 ENCOUNTER — Observation Stay (HOSPITAL_COMMUNITY): Payer: Medicare PPO

## 2021-01-03 ENCOUNTER — Other Ambulatory Visit: Payer: Self-pay

## 2021-01-03 ENCOUNTER — Emergency Department (HOSPITAL_COMMUNITY): Payer: Medicare PPO

## 2021-01-03 DIAGNOSIS — Z794 Long term (current) use of insulin: Secondary | ICD-10-CM | POA: Diagnosis not present

## 2021-01-03 DIAGNOSIS — Z20822 Contact with and (suspected) exposure to covid-19: Secondary | ICD-10-CM | POA: Insufficient documentation

## 2021-01-03 DIAGNOSIS — Z8673 Personal history of transient ischemic attack (TIA), and cerebral infarction without residual deficits: Secondary | ICD-10-CM | POA: Diagnosis not present

## 2021-01-03 DIAGNOSIS — E1169 Type 2 diabetes mellitus with other specified complication: Secondary | ICD-10-CM | POA: Diagnosis not present

## 2021-01-03 DIAGNOSIS — E785 Hyperlipidemia, unspecified: Secondary | ICD-10-CM | POA: Diagnosis present

## 2021-01-03 DIAGNOSIS — I130 Hypertensive heart and chronic kidney disease with heart failure and stage 1 through stage 4 chronic kidney disease, or unspecified chronic kidney disease: Secondary | ICD-10-CM | POA: Insufficient documentation

## 2021-01-03 DIAGNOSIS — G459 Transient cerebral ischemic attack, unspecified: Principal | ICD-10-CM | POA: Insufficient documentation

## 2021-01-03 DIAGNOSIS — E1122 Type 2 diabetes mellitus with diabetic chronic kidney disease: Secondary | ICD-10-CM | POA: Insufficient documentation

## 2021-01-03 DIAGNOSIS — I10 Essential (primary) hypertension: Secondary | ICD-10-CM | POA: Diagnosis not present

## 2021-01-03 DIAGNOSIS — E1165 Type 2 diabetes mellitus with hyperglycemia: Secondary | ICD-10-CM | POA: Diagnosis present

## 2021-01-03 DIAGNOSIS — Z85520 Personal history of malignant carcinoid tumor of kidney: Secondary | ICD-10-CM | POA: Diagnosis not present

## 2021-01-03 DIAGNOSIS — Z79899 Other long term (current) drug therapy: Secondary | ICD-10-CM | POA: Insufficient documentation

## 2021-01-03 DIAGNOSIS — I639 Cerebral infarction, unspecified: Secondary | ICD-10-CM | POA: Diagnosis not present

## 2021-01-03 DIAGNOSIS — N184 Chronic kidney disease, stage 4 (severe): Secondary | ICD-10-CM | POA: Insufficient documentation

## 2021-01-03 DIAGNOSIS — I5032 Chronic diastolic (congestive) heart failure: Secondary | ICD-10-CM | POA: Diagnosis not present

## 2021-01-03 DIAGNOSIS — E669 Obesity, unspecified: Secondary | ICD-10-CM

## 2021-01-03 DIAGNOSIS — R4781 Slurred speech: Secondary | ICD-10-CM | POA: Diagnosis present

## 2021-01-03 DIAGNOSIS — R2689 Other abnormalities of gait and mobility: Secondary | ICD-10-CM | POA: Insufficient documentation

## 2021-01-03 DIAGNOSIS — I6782 Cerebral ischemia: Secondary | ICD-10-CM | POA: Diagnosis not present

## 2021-01-03 DIAGNOSIS — I6601 Occlusion and stenosis of right middle cerebral artery: Secondary | ICD-10-CM | POA: Diagnosis not present

## 2021-01-03 DIAGNOSIS — R739 Hyperglycemia, unspecified: Secondary | ICD-10-CM | POA: Diagnosis not present

## 2021-01-03 DIAGNOSIS — J320 Chronic maxillary sinusitis: Secondary | ICD-10-CM | POA: Diagnosis not present

## 2021-01-03 DIAGNOSIS — Z7902 Long term (current) use of antithrombotics/antiplatelets: Secondary | ICD-10-CM | POA: Insufficient documentation

## 2021-01-03 LAB — COMPREHENSIVE METABOLIC PANEL
ALT: 47 U/L — ABNORMAL HIGH (ref 0–44)
AST: 21 U/L (ref 15–41)
Albumin: 3.8 g/dL (ref 3.5–5.0)
Alkaline Phosphatase: 98 U/L (ref 38–126)
Anion gap: 7 (ref 5–15)
BUN: 57 mg/dL — ABNORMAL HIGH (ref 8–23)
CO2: 23 mmol/L (ref 22–32)
Calcium: 9.6 mg/dL (ref 8.9–10.3)
Chloride: 104 mmol/L (ref 98–111)
Creatinine, Ser: 2.19 mg/dL — ABNORMAL HIGH (ref 0.44–1.00)
GFR, Estimated: 24 mL/min — ABNORMAL LOW (ref >60)
Glucose, Bld: 167 mg/dL — ABNORMAL HIGH (ref 70–99)
Potassium: 4.5 mmol/L (ref 3.5–5.1)
Sodium: 134 mmol/L — ABNORMAL LOW (ref 135–145)
Total Bilirubin: 0.3 mg/dL (ref 0.3–1.2)
Total Protein: 7.4 g/dL (ref 6.5–8.1)

## 2021-01-03 LAB — CBC WITH DIFFERENTIAL/PLATELET
Abs Immature Granulocytes: 0.03 10*3/uL (ref 0.00–0.07)
Basophils Absolute: 0.1 10*3/uL (ref 0.0–0.1)
Basophils Relative: 1 %
Eosinophils Absolute: 0.3 10*3/uL (ref 0.0–0.5)
Eosinophils Relative: 4 %
HCT: 33.5 % — ABNORMAL LOW (ref 36.0–46.0)
Hemoglobin: 11 g/dL — ABNORMAL LOW (ref 12.0–15.0)
Immature Granulocytes: 0 %
Lymphocytes Relative: 17 %
Lymphs Abs: 1.3 10*3/uL (ref 0.7–4.0)
MCH: 28.9 pg (ref 26.0–34.0)
MCHC: 32.8 g/dL (ref 30.0–36.0)
MCV: 88.2 fL (ref 80.0–100.0)
Monocytes Absolute: 0.6 10*3/uL (ref 0.1–1.0)
Monocytes Relative: 8 %
Neutro Abs: 5.2 10*3/uL (ref 1.7–7.7)
Neutrophils Relative %: 70 %
Platelets: 261 10*3/uL (ref 150–400)
RBC: 3.8 MIL/uL — ABNORMAL LOW (ref 3.87–5.11)
RDW: 13.2 % (ref 11.5–15.5)
WBC: 7.5 10*3/uL (ref 4.0–10.5)
nRBC: 0 % (ref 0.0–0.2)

## 2021-01-03 LAB — RESP PANEL BY RT-PCR (FLU A&B, COVID) ARPGX2
Influenza A by PCR: NEGATIVE
Influenza B by PCR: NEGATIVE
SARS Coronavirus 2 by RT PCR: NEGATIVE

## 2021-01-03 LAB — BLOOD GAS, VENOUS
Acid-base deficit: 1 mmol/L (ref 0.0–2.0)
Bicarbonate: 22.7 mmol/L (ref 20.0–28.0)
Drawn by: 4252
FIO2: 21
O2 Saturation: 60 %
Patient temperature: 36.6
pCO2, Ven: 44.1 mmHg (ref 44.0–60.0)
pH, Ven: 7.352 (ref 7.250–7.430)
pO2, Ven: 34.2 mmHg (ref 32.0–45.0)

## 2021-01-03 MED ORDER — ACETAMINOPHEN 325 MG PO TABS: 650.0000 mg | ORAL_TABLET | ORAL | Status: DC | PRN

## 2021-01-03 MED ORDER — INSULIN GLARGINE-YFGN 100 UNIT/ML ~~LOC~~ SOLN
8.0000 [IU] | Freq: Two times a day (BID) | SUBCUTANEOUS | Status: DC
  Administered 2021-01-03: 8 [IU] via SUBCUTANEOUS
  Filled 2021-01-03 (×8): qty 0.08

## 2021-01-03 MED ORDER — ATORVASTATIN CALCIUM 40 MG PO TABS
80.0000 mg | ORAL_TABLET | Freq: Every day | ORAL | Status: DC
  Administered 2021-01-03 – 2021-01-04 (×2): 80 mg via ORAL
  Filled 2021-01-03 (×2): qty 2

## 2021-01-03 MED ORDER — SODIUM CHLORIDE 0.9 % IV BOLUS
1000.0000 mL | Freq: Once | INTRAVENOUS | Status: AC
  Administered 2021-01-03: 1000 mL via INTRAVENOUS

## 2021-01-03 MED ORDER — INSULIN ASPART 100 UNIT/ML IJ SOLN: 0.0000 [IU] | Freq: Every day | INTRAMUSCULAR | Status: DC

## 2021-01-03 MED ORDER — CLOPIDOGREL BISULFATE 75 MG PO TABS
75.0000 mg | ORAL_TABLET | Freq: Every day | ORAL | Status: DC
  Administered 2021-01-04: 75 mg via ORAL
  Filled 2021-01-03: qty 1

## 2021-01-03 MED ORDER — SENNOSIDES-DOCUSATE SODIUM 8.6-50 MG PO TABS
1.0000 | ORAL_TABLET | Freq: Every evening | ORAL | Status: DC | PRN
  Administered 2021-01-03: 1 via ORAL
  Filled 2021-01-03: qty 1

## 2021-01-03 MED ORDER — ASPIRIN 81 MG PO CHEW
81.0000 mg | CHEWABLE_TABLET | Freq: Every day | ORAL | Status: DC
  Administered 2021-01-03 – 2021-01-04 (×2): 81 mg via ORAL
  Filled 2021-01-03 (×2): qty 1

## 2021-01-03 MED ORDER — LORAZEPAM 2 MG/ML IJ SOLN: 0.5000 mg | Freq: Once | INTRAMUSCULAR | Status: DC | PRN

## 2021-01-03 MED ORDER — LORAZEPAM 2 MG/ML IJ SOLN
0.5000 mg | Freq: Once | INTRAMUSCULAR | Status: AC
  Administered 2021-01-03: 0.5 mg via INTRAVENOUS
  Filled 2021-01-03: qty 1

## 2021-01-03 MED ORDER — SODIUM CHLORIDE 0.9 % IV SOLN: INTRAVENOUS | Status: DC

## 2021-01-03 MED ORDER — CARVEDILOL 12.5 MG PO TABS: 12.5000 mg | ORAL_TABLET | Freq: Two times a day (BID) | ORAL | Status: DC

## 2021-01-03 MED ORDER — INSULIN GLARGINE 100 UNITS/ML SOLOSTAR PEN: 8.0000 [IU] | PEN_INJECTOR | Freq: Two times a day (BID) | SUBCUTANEOUS | Status: DC

## 2021-01-03 MED ORDER — INSULIN ASPART 100 UNIT/ML IJ SOLN
0.0000 [IU] | Freq: Three times a day (TID) | INTRAMUSCULAR | Status: DC
  Administered 2021-01-04: 15 [IU] via SUBCUTANEOUS
  Administered 2021-01-04: 11 [IU] via SUBCUTANEOUS

## 2021-01-03 MED ORDER — CLONIDINE HCL 0.1 MG PO TABS
0.1500 mg | ORAL_TABLET | Freq: Two times a day (BID) | ORAL | Status: DC
  Administered 2021-01-03 – 2021-01-04 (×2): 0.15 mg via ORAL
  Filled 2021-01-03 (×2): qty 2

## 2021-01-03 MED ORDER — HEPARIN SODIUM (PORCINE) 5000 UNIT/ML IJ SOLN
5000.0000 [IU] | Freq: Three times a day (TID) | INTRAMUSCULAR | Status: DC
  Administered 2021-01-03 – 2021-01-04 (×3): 5000 [IU] via SUBCUTANEOUS
  Filled 2021-01-03 (×3): qty 1

## 2021-01-03 MED ORDER — STROKE: EARLY STAGES OF RECOVERY BOOK
Freq: Once | Status: AC
  Filled 2021-01-03: qty 1

## 2021-01-03 MED ORDER — OXYMETAZOLINE HCL 0.05 % NA SOLN
1.0000 | Freq: Two times a day (BID) | NASAL | Status: DC
  Administered 2021-01-03: 1 via NASAL
  Filled 2021-01-03: qty 30

## 2021-01-03 MED ORDER — CARVEDILOL 12.5 MG PO TABS
12.5000 mg | ORAL_TABLET | Freq: Two times a day (BID) | ORAL | Status: DC
  Administered 2021-01-03 – 2021-01-04 (×2): 12.5 mg via ORAL
  Filled 2021-01-03 (×2): qty 1

## 2021-01-03 MED ORDER — ACETAMINOPHEN 650 MG RE SUPP: 650.0000 mg | RECTAL | Status: DC | PRN

## 2021-01-03 MED ORDER — ACETAMINOPHEN 160 MG/5ML PO SOLN: 650.0000 mg | ORAL | Status: DC | PRN

## 2021-01-03 MED ORDER — CLONIDINE HCL 0.1 MG PO TABS: 0.1000 mg | ORAL_TABLET | ORAL | Status: DC

## 2021-01-03 NOTE — ED Notes (Signed)
Patient transported to CT 

## 2021-01-03 NOTE — ED Provider Notes (Signed)
[facility] EMERGENCY DEPARTMENT Provider Note   CSN: [id number] Arrival date & time: 01/03/21  1144     History Chief Complaint  Patient presents with   Hyperglycemia    Briana Garrison is a 70 y.o. female.  HPI  Patient with medical history including diabetes, GERD, hypertension, CVAs presents with chief complaint of elevated blood blood sugar as well as slurred speech.  Patient states since getting her influenza vaccine she has been having elevated blood sugars been having hard time keeping it controlled, she endorses increased thirst increased urination but she denies general fatigue, stomach pains, nausea, vomiting, diarrhea, states that she has been compliant with her medications.  Daughter was at bedside and noted that earlier today around 1030, last known well was at 7:30 am,  the patient slight right-sided facial droop and worsening slurred speech this had resolved after about an hour time, daughter states that she is at her baseline, there is no recent falls, she is not on anticoagulants, she does take clopidogrel for her previous CVAs.  The patient states that she is not having headaches, change in vision, weakness the upper lower extremities, states she does endorse worsening paresthesias on her left side.  She has no other complaints.  Past Medical History:  Diagnosis Date   Cancer of kidney ([place]) 05/14/2009   Diabetes mellitus    Endometrial mass    GERD (gastroesophageal reflux disease)    Hypercholesterolemia    Hypertension    Internal hemorrhoids    S/P colonoscopy 06/10/00 by Dr. Lajoyce Corners   Shingles    Stroke [facility]) 09/2019   2 strokes    Patient Active Problem List   Diagnosis Date Noted   TIA (transient ischemic attack) 01/03/2021   Slurred speech 01/16/2020   CKD (chronic kidney disease) stage 4, GFR 15-29 ml/min ([place]) 01/16/2020   Uncontrolled type 2 diabetes mellitus with hyperglycemia ([place]) 01/16/2020   CVA (cerebral vascular accident) ([place]) 12/05/2019    Acute CVA (cerebrovascular accident) ([place]) 06/21/2019   History of renal cell carcinoma 06/21/2019   Left ventricular hypertrophy 11/26/2018   CKD stage G3b/A3, GFR 30-44 and albumin creatinine ratio >300 mg/g ([place]) 11/26/2018   Change in stool 08/03/2018   Choledocholithiasis with obstruction 02/07/2017   AKI (acute kidney injury) ([place]) 02/07/2017   Acute kidney injury superimposed on CKD ([place]) 06/23/2011   Hypoglycemia associated with diabetes ([place]) 06/23/2011   Type 2 diabetes mellitus with hyperlipidemia ([place]) 06/23/2011   Essential hypertension 06/23/2011   Hyperlipidemia 06/23/2011   Normocytic anemia 06/23/2011   UTI (lower urinary tract infection) 06/23/2011    Past Surgical History:  Procedure Laterality Date   BIOPSY  09/06/2018   Procedure: BIOPSY;  Surgeon: Rogene Houston, MD;  Location: [facility] ENDO SUITE;  Service: Endoscopy;;   CHOLECYSTECTOMY  11/13/08   COLONOSCOPY N/A 09/06/2018   Procedure: COLONOSCOPY;  Surgeon: Rogene Houston, MD;  Location: [facility] ENDO SUITE;  Service: Endoscopy;  Laterality: N/A;  [place]  05/14/09   Diagnostic hysteroscopy, D&C, endometrial polypectomy  07/01/2005   LIPECTOMY     POLYPECTOMY  09/06/2018   Procedure: POLYPECTOMY;  Surgeon: Rogene Houston, MD;  Location: [facility] ENDO SUITE;  Service: Endoscopy;;     OB History   No obstetric history on file.     Family History  Problem Relation Age of Onset   Ovarian cancer Mother    Stroke Mother    Stroke Father    Stroke Brother  Stroke Sister     Social History   Tobacco Use   Smoking status: Never   Smokeless tobacco: Never  Vaping Use   Vaping Use: Never used  Substance Use Topics   Alcohol use: No   Drug use: No    Home Medications Prior to Admission medications   Medication Sig Start Date End Date Taking? Authorizing Provider  acetaminophen (TYLENOL) 500 MG tablet Take 500 mg by mouth every 6 (six) hours as needed for mild pain or  headache.    Yes [provider]  albuterol (VENTOLIN HFA) 108 (90 Base) MCG/ACT inhaler Inhale 2 puffs into the lungs every 6 (six) hours as needed for wheezing or shortness of breath.   Yes [provider]  atorvastatin (LIPITOR) 80 MG tablet Take 1 tablet (80 mg total) by mouth daily. 06/24/19 01/03/21 Yes Pahwani, Einar Grad, MD  carvedilol (COREG) 25 MG tablet Take 12.5 mg by mouth 2 (two) times daily. 12/01/19  Yes [provider]  cetirizine (ZYRTEC) 10 MG tablet Take 10 mg by mouth daily as needed for allergies.   Yes [provider]  Cholecalciferol (VITAMIN D-3) 25 MCG (1000 UT) CAPS Take 1,000 Units by mouth daily.    Yes [provider]  cloNIDine (CATAPRES) 0.1 MG tablet Take 0.1 mg by mouth See admin instructions. Take 1 and 1/2 tablet twice a day   Yes [provider]  clopidogrel (PLAVIX) 75 MG tablet Take 1 tablet by mouth once daily Patient taking differently: Take 75 mg by mouth See admin instructions. Take 1/2 tablet in the morning and 1/2 tablet every evening 10/11/20  Yes Sethi, Lucy [place], MD  Dulaglutide (TRULICITY) 4.5 PV/3.7SM SOPN Inject 4.5 mg into the skin every 7 (seven) days.   Yes [provider]  furosemide (LASIX) 20 MG tablet Take 40 mg by mouth 2 (two) times daily.  09/04/19  Yes [provider]  gabapentin (NEURONTIN) 100 MG capsule Take 100 mg by mouth daily as needed (shoulder pain).   Yes [provider]  glucose 4 GM chewable tablet Chew 1 tablet by mouth once as needed for low blood sugar.   Yes [provider]  insulin aspart (NOVOLOG FLEXPEN) 100 UNIT/ML FlexPen Inject 4-12 Units into the skin 3 (three) times daily with meals. Per sliding scale - based on CBG   Yes [provider]  insulin glargine (LANTUS) 100 unit/mL SOPN Inject 8-14 Units into the skin 2 (two) times daily. 14 units am    6 units bedtime   Yes [provider]  rosuvastatin (CRESTOR) 40 MG tablet  Take 40 mg by mouth daily. 08/30/19  Yes [provider]  telmisartan (MICARDIS) 20 MG tablet Take 20 mg by mouth daily.  08/30/19  Yes [provider]  vitamin B-12 (CYANOCOBALAMIN) 1000 MCG tablet Take 1,000 mcg by mouth daily.   Yes [provider]  carvedilol (COREG) 25 MG tablet Take 0.5 tablets by mouth 2 (two) times daily. Patient not taking: Reported on 01/03/2021    [provider]  clopidogrel (PLAVIX) 75 MG tablet Take 1 tablet by mouth daily. Patient not taking: Reported on 01/03/2021    [provider]  Dulaglutide (TRULICITY) 3 OL/0.7EM SOPN Inject 3 mg into the skin every Tuesday.    [provider]  Dulaglutide 1.5 MG/0.5ML SOPN Inject 1.5 mg into the skin every Tuesday.  Patient not taking: Reported on 01/03/2021    [provider]  senna-docusate (SENOKOT-S) 8.6-50 MG tablet Take  1 tablet by mouth daily as needed for mild constipation. Patient not taking: Reported on 01/03/2021    [provider]    Allergies    Amlodipine and Tramadol  Review of Systems   Review of Systems  Constitutional:  Positive for appetite change. Negative for chills and fever.  HENT:  Negative for congestion.   Respiratory:  Negative for shortness of breath.   Cardiovascular:  Negative for chest pain.  Gastrointestinal:  Negative for abdominal pain, diarrhea, nausea and vomiting.  Genitourinary:  Positive for frequency. Negative for dysuria and enuresis.  Musculoskeletal:  Negative for back pain.  Skin:  Negative for rash.  Neurological:  Positive for facial asymmetry and numbness. Negative for dizziness.  Hematological:  Does not bruise/bleed easily.   Physical Exam Updated Vital Signs BP 135/62   Pulse (!) 57   Temp 98 F (36.7 C)   Resp 16   Ht 5\' 1"  (1.549 m)   Wt 83.9 kg   SpO2 100%   BMI 34.95 kg/m   Physical Exam Vitals and nursing note reviewed.  Constitutional:      General: She is not in acute distress.     Appearance: She is not ill-appearing.  HENT:     Head: Normocephalic and atraumatic.     Nose: No congestion.     Mouth/Throat:     Mouth: Mucous membranes are moist.     Pharynx: Oropharynx is clear.  Eyes:     General: No visual field deficit.    Extraocular Movements: Extraocular movements intact.     Conjunctiva/sclera: Conjunctivae normal.     Pupils: Pupils are equal, round, and reactive to light.  Cardiovascular:     Rate and Rhythm: Normal rate and regular rhythm.     Pulses: Normal pulses.     Heart sounds: No murmur heard.   No friction rub. No gallop.  Pulmonary:     Effort: No respiratory distress.     Breath sounds: No wheezing, rhonchi or rales.  Abdominal:     Palpations: Abdomen is soft.     Tenderness: There is no abdominal tenderness. There is no right CVA tenderness or left CVA tenderness.  Musculoskeletal:     Right lower leg: No edema.     Left lower leg: No edema.  Skin:    General: Skin is warm and dry.  Neurological:     Mental Status: She is alert.     GCS: GCS eye subscore is 4. GCS verbal subscore is 5. GCS motor subscore is 6.     Cranial Nerves: Cranial nerves 2-12 are intact. No cranial nerve deficit.     Sensory: Sensory deficit present.     Motor: No weakness.     Coordination: Romberg sign negative. Finger-Nose-Finger Test normal.     Comments: Cranial nerves II through XII grossly intact, no difficult word finding, slight slurring of her words but this is her baseline, no unilateral weakness present.  He will follow two-step commands.  Endorses decreased sensation on the left upper and lower extremities.  Psychiatric:        Mood and Affect: Mood normal.    ED Results / Procedures / Treatments   Labs (all labs ordered are listed, but only abnormal results are displayed) Labs Reviewed  COMPREHENSIVE METABOLIC PANEL - Abnormal; Notable for the following components:      Result Value   Sodium 134 (*)    Glucose, Bld 167 (*)    BUN 57 (*)  Creatinine, Ser 2.19 (*)    ALT 47 (*)    GFR, Estimated 24 (*)    All other components within normal limits  CBC WITH DIFFERENTIAL/PLATELET - Abnormal; Notable for the following components:   RBC 3.80 (*)    Hemoglobin 11.0 (*)    HCT 33.5 (*)    All other components within normal limits  BLOOD GAS, VENOUS  URINALYSIS, ROUTINE W REFLEX MICROSCOPIC  HIV ANTIBODY (ROUTINE TESTING W REFLEX)  HEMOGLOBIN A1C  LIPID PANEL    EKG None  Radiology CT Head Wo Contrast  Result Date: 01/03/2021 CLINICAL DATA:  TIA EXAM: CT HEAD WITHOUT CONTRAST TECHNIQUE: Contiguous axial images were obtained from the base of the skull through the vertex without intravenous contrast. COMPARISON:  01/16/2020 FINDINGS: Brain: No evidence of acute infarction, hemorrhage, hydrocephalus, extra-axial collection or mass lesion/mass effect. Evolution of small chronic infarct in the right parietooccipital region. Patchy low-density changes within the periventricular and subcortical white matter compatible with chronic microvascular ischemic change. Mild diffuse cerebral volume loss. Vascular: Atherosclerotic calcifications involving the large vessels of the skull base. No unexpected hyperdense vessel. Skull: Normal. Negative for fracture or focal lesion. Sinuses/Orbits: Chronic paranasal sinus disease, most pronounced within the right maxillary region, now progressively involving the right ethmoid air cells and right frontal sinuses. Other: None. IMPRESSION: 1. No acute intracranial findings. 2. Chronic microvascular ischemic change and cerebral volume loss. 3. Chronic paranasal sinus disease, most pronounced within the right maxillary sinus, now progressively involving the right ethmoid air cells and right frontal sinuses. Correlate for sinusitis. Electronically Signed   By: Davina Poke D.O.   On: 01/03/2021 13:54   MR BRAIN WO CONTRAST  Result Date: 01/03/2021 CLINICAL DATA:  Transient ischemic attack (TIA) EXAM:  MRI HEAD WITHOUT CONTRAST TECHNIQUE: Multiplanar, multiecho pulse sequences of the brain and surrounding structures were obtained without intravenous contrast. COMPARISON:  December 2021 FINDINGS: Brain: There is no acute infarction or intracranial hemorrhage. There is no intracranial mass, mass effect, or edema. There is no hydrocephalus or extra-axial fluid collection. Chronic cortical/subcortical infarct inferior right parietal lobe. Chronic small vessel infarcts of the deep gray nuclei and adjacent white matter, pons extending into the right brachium pontis, and corpus callosum. Punctate focus of susceptibility likely reflecting chronic microhemorrhage is again identified along the medial left temporal lobe. Additional patchy and confluent areas of T2 hyperintensity in the supratentorial and pontine white matter nonspecific but probably reflect moderate chronic microvascular ischemic changes. Vascular: Unchanged diminished right vertebral artery flow void. Skull and upper cervical spine: Normal marrow signal is preserved. Sinuses/Orbits: Paranasal sinus mucosal thickening with moderate right ethmoid opacification. Right maxillary sinus is chronically opacified. Bilateral lens replacements. Other: Sella is unremarkable.  Mastoid air cells are clear. IMPRESSION: No acute infarction, hemorrhage, or mass. Chronic infarcts and chronic microvascular ischemic changes as seen previously. Electronically Signed   By: Macy Mis M.D.   On: 01/03/2021 16:46    Procedures Procedures   Medications Ordered in ED Medications  atorvastatin (LIPITOR) tablet 80 mg (has no administration in time range)  cloNIDine (CATAPRES) tablet 0.1 mg (has no administration in time range)  carvedilol (COREG) tablet 12.5 mg (has no administration in time range)  insulin glargine (LANTUS) Solostar Pen 8 Units (has no administration in time range)   stroke: mapping our early stages of recovery book (has no administration in time  range)  0.9 %  sodium chloride infusion (has no administration in time range)  acetaminophen (TYLENOL) tablet 650 mg (has  no administration in time range)    Or  acetaminophen (TYLENOL) 160 MG/5ML solution 650 mg (has no administration in time range)    Or  acetaminophen (TYLENOL) suppository 650 mg (has no administration in time range)  senna-docusate (Senokot-S) tablet 1 tablet (has no administration in time range)  heparin injection 5,000 Units (has no administration in time range)  LORazepam (ATIVAN) injection 0.5 mg (has no administration in time range)  aspirin chewable tablet 81 mg (has no administration in time range)  clopidogrel (PLAVIX) tablet 75 mg (has no administration in time range)  insulin aspart (novoLOG) injection 0-15 Units (has no administration in time range)  insulin aspart (novoLOG) injection 0-5 Units (has no administration in time range)  sodium chloride 0.9 % bolus 1,000 mL (0 mLs Intravenous Stopped 01/03/21 1520)  LORazepam (ATIVAN) injection 0.5 mg (0.5 mg Intravenous Given 01/03/21 1520)    ED Course  I have reviewed the triage vital signs and the nursing notes.  Pertinent labs & imaging results that were available during my care of the patient were reviewed by me and considered in my medical decision making (see chart for details).    MDM Rules/Calculators/A&P                          Initial impression-presents with elevated blood sugar word slurring.  She is alert, no acute distress, vital signs are reassuring.  Unclear etiology but I am concerned for TIA will obtain CT head basic lab work-up and reassess.  Work-up-CBC shows normocytic anemia hemoglobin 11, CMP shows sodium 134, glucose 167, BUN 57, creatinine 2.19, GFR of 24 VBG unremarkable, MRI of head shows no acute findings, shows chronic microvascular ischemia as well as chronic paranasal sinus disease.  Reassessment-slight increase in her base creatinine as well as decrease in her GFR will provide  her with some fluids, CT head was negative will obtain MRI for further evaluation.  MRI was unremarkable for acute findings, will consult with neurology for further recommendations.  Update family on recommendation from neurology and they are agreement this plan will consult hospitalist team for admission.  Consult-spoke with Dr. Quinn Axe of neurology she recommends hospital admission, for TIA work-up, dual platelet therapy since ABCD score is 4.  Spoke with Dr. Waldron Labs of the hospitalist team who admit the patient.  Rule out-I have low suspicion for intracranial head bleed and or mass as CT imaging is negative for acute findings.  Low suspicion for CVA is no focal deficit present my exam, MRI is negative for acute findings.  Low suspicion for dissection of the vertebral or carotid artery as presentation atypical etiology.  Low suspicion for meningitis she has no meningeal sign present my exam no leukocytosis, afebrile.  Low suspicion for DKA or HSS there is no acidosis present.    Plan-admission for TIA work-up.  Final Clinical Impression(s) / ED Diagnoses Final diagnoses:  TIA (transient ischemic attack)    Rx / DC Orders ED Discharge Orders     None        Marcello Fennel, PA-C 01/03/21 1845    Jeanell Sparrow, DO 01/08/21 [phone]

## 2021-01-03 NOTE — Progress Notes (Signed)
Neurology Telephone Note  Contacted by Mr. Ileene Patrick PA about this patient with prior hx stroke w/ residual R sided sensory deficits who had episode of transient dysarthria with facial droop this AM lasting for hours, now resolved. MRI brain w/o e/o acute infarct. I recommended admission for TIA workup with the following recommendations:  - Goal normotension, avoid hypotension - MRI brain wo contrast - CTA/MRA head and neck if not already obtained - TTE - Check A1c and LDL + add statin per guidelines - ASA 81mg  daily + plavix 75mg  daily x21 days f/b plavix 75mg  daily monotherapy after that (ABCD2 score is 4; patient was on aspirin previously - q4 hr neuro checks - STAT head CT for any change in neuro exam - Tele - PT/OT/SLP - Stroke education - Amb referral to neurology upon discharge   If any further neurologic questions tomorrow, Dr. Hortense Ramal will be available for routine teleneurology consultation at that time.  Su Monks, MD [facility] [phone]  If 7pm- 7am, please page neurology on call as listed in [place].

## 2021-01-03 NOTE — H&P (Signed)
TRH H&P   Patient Demographics:    Briana Garrison, is a 70 y.o. female  MRN: [id number]   DOB - 10-10-1950  Admit Date - 01/03/2021  Outpatient Primary MD for the patient is [name], MD  Referring MD/NP/PA: PA Ileene Patrick    Patient coming from: home  Chief Complaint  Patient presents with   Hyperglycemia      HPI:    Briana Garrison  is a 70 y.o. female, with medical history of diabetes mellitus type 2, hypertension, hyperlipidemia, small vessel strokes -Patient presents to ED secondary to elevated blood sugar, slurred speech and facial droop, daughter reports patient been having hyperglycemia since her influenza shot, she does report increased thirst and urination, no nausea, vomiting, diarrhea, daughter reports this morning she noticed her mom to have right-sided facial droop, and worsening slurred speech, which has resolved within 1 hour, reports patient currently back at baseline, daughter reports patient is compliant with her medication including Plavix, insulin and antihypertensive regimen, she was concerned about stroke so she brought her to ED - in ED back at her baseline, CT head/MRI brain with no acute findings, work-up significant for attending of 2.1 around her baseline, blood glucose of 167, blood pressure is acceptable, patient was seen by teleneurology who recommended admission for TIA work-up.    Review of systems:    In addition to the HPI above, does report polyuria and polydipsia No Fever-chills, No Headache, No changes with Vision or hearing, No problems swallowing food or Liquids, No Chest pain, Cough or Shortness of Breath, No Abdominal pain, No Nausea or Vommitting, Bowel movements are regular, No Blood in stool or Urine, No dysuria, No new skin rashes or bruises, No new joints pains-aches,  Patient had worsening slurred speech and facial droop which  currently resolved No recent weight gain or loss, No polyuria, polydypsia or polyphagia, No significant Mental Stressors.  A full 10 point Review of Systems was done, except as stated above, all other Review of Systems were negative.   With Past History of the following :    Past Medical History:  Diagnosis Date   Cancer of kidney ([place]) 05/14/2009   Diabetes mellitus    Endometrial mass    GERD (gastroesophageal reflux disease)    Hypercholesterolemia    Hypertension    Internal hemorrhoids    S/P colonoscopy 06/10/00 by Dr. Lajoyce Corners   Shingles    Stroke [facility]) 09/2019   2 strokes      Past Surgical History:  Procedure Laterality Date   BIOPSY  09/06/2018   Procedure: BIOPSY;  Surgeon: Rogene Houston, MD;  [facility];  Service: Endoscopy;;   CHOLECYSTECTOMY  11/13/08   COLONOSCOPY N/A 09/06/2018   Procedure: COLONOSCOPY;  Surgeon: Rogene Houston, MD;  Location[facility];  Service: Endoscopy;  Laterality: N/A;  [place]  05/14/09   Diagnostic hysteroscopy, D&C, endometrial polypectomy  07/01/2005   LIPECTOMY     POLYPECTOMY  09/06/2018   Procedure: POLYPECTOMY;  Surgeon: Rogene Houston, MD;  Location: [facility];  Service: Endoscopy;;      Social History:     Social History   Tobacco Use   Smoking status: Never   Smokeless tobacco: Never  Substance Use Topics   Alcohol use: No       Family History :     Family History  Problem Relation Age of Onset   Ovarian cancer Mother    Stroke Mother    Stroke Father    Stroke Brother    Stroke Sister      Home Medications:   Prior to Admission medications   Medication Sig Start Date End Date Taking? Authorizing Provider  acetaminophen (TYLENOL) 500 MG tablet Take 500 mg by mouth every 6 (six) hours as needed for mild pain or headache.    Yes [provider]  albuterol (VENTOLIN HFA) 108 (90 Base) MCG/ACT inhaler Inhale 2 puffs into the lungs every 6  (six) hours as needed for wheezing or shortness of breath.   Yes [provider]  atorvastatin (LIPITOR) 80 MG tablet Take 1 tablet (80 mg total) by mouth daily. 06/24/19 01/03/21 Yes Pahwani, Einar Grad, MD  carvedilol (COREG) 25 MG tablet Take 12.5 mg by mouth 2 (two) times daily. 12/01/19  Yes [provider]  cetirizine (ZYRTEC) 10 MG tablet Take 10 mg by mouth daily as needed for allergies.   Yes [provider]  Cholecalciferol (VITAMIN D-3) 25 MCG (1000 UT) CAPS Take 1,000 Units by mouth daily.    Yes [provider]  cloNIDine (CATAPRES) 0.1 MG tablet Take 0.1 mg by mouth See admin instructions. Take 1 and 1/2 tablet twice a day   Yes [provider]  clopidogrel (PLAVIX) 75 MG tablet Take 1 tablet by mouth once daily Patient taking differently: Take 75 mg by mouth See admin instructions. Take 1/2 tablet in the morning and 1/2 tablet every evening 10/11/20  Yes Sethi, Lucy Antigua, MD  Dulaglutide (TRULICITY) 4.5 DT/2.6ZT SOPN Inject 4.5 mg into the skin every 7 (seven) days.   Yes [provider]  furosemide (LASIX) 20 MG tablet Take 40 mg by mouth 2 (two) times daily.  09/04/19  Yes [provider]  gabapentin (NEURONTIN) 100 MG capsule Take 100 mg by mouth daily as needed (shoulder pain).   Yes [provider]  glucose 4 GM chewable tablet Chew 1 tablet by mouth once as needed for low blood sugar.   Yes [provider]  insulin aspart (NOVOLOG FLEXPEN) 100 UNIT/ML FlexPen Inject 4-12 Units into the skin 3 (three) times daily with meals. Per sliding scale - based on CBG   Yes [provider]  insulin glargine (LANTUS) 100 unit/mL SOPN Inject 8-14 Units into the skin 2 (two) times daily. 14 units am    6 units bedtime   Yes [provider]  rosuvastatin (CRESTOR) 40 MG tablet Take 40 mg by mouth daily. 08/30/19  Yes [provider]  telmisartan (MICARDIS) 20 MG tablet Take 20 mg by mouth daily.  08/30/19   Yes [provider]  vitamin B-12 (CYANOCOBALAMIN) 1000 MCG tablet Take 1,000 mcg by mouth daily.   Yes [provider]  carvedilol (COREG) 25 MG tablet Take 0.5 tablets by mouth 2 (two) times daily. Patient not taking: Reported on 01/03/2021  [provider]  clopidogrel (PLAVIX) 75 MG tablet Take 1 tablet by mouth daily. Patient not taking: Reported on 01/03/2021    [provider]  Dulaglutide (TRULICITY) 3 DX/8.3JA SOPN Inject 3 mg into the skin every Tuesday.    [provider]  Dulaglutide 1.5 MG/0.5ML SOPN Inject 1.5 mg into the skin every Tuesday.  Patient not taking: Reported on 01/03/2021    [provider]  senna-docusate (SENOKOT-S) 8.6-50 MG tablet Take 1 tablet by mouth daily as needed for mild constipation. Patient not taking: Reported on 01/03/2021    [provider]     Allergies:     Allergies  Allergen Reactions   Amlodipine Swelling   Tramadol Diarrhea and Nausea And Vomiting     Physical Exam:   Vitals  Blood pressure 128/62, pulse (!) 57, temperature 97.9 F (36.6 C), temperature source Oral, resp. rate 16, height 5\' 1"  (1.549 m), weight 83.9 kg, SpO2 97 %.   1. General developed female, laying in bed, no apparent distress  2. Normal affect and insight, Not Suicidal or Homicidal, Awake Alert, Oriented X 3.  3.  She is with very mild dysarthria, speech at baseline per daughter, motor grossly intact with no significant deficits  4. Ears and Eyes appear Normal, Conjunctivae clear, PERRLA. Moist Oral Mucosa.  5. Supple Neck, No JVD, No cervical lymphadenopathy appriciated, No Carotid Bruits.  6. Symmetrical Chest wall movement, Good air movement bilaterally, CTAB.  7. RRR, No Gallops, Rubs or Murmurs, No Parasternal Heave.  8. Positive Bowel Sounds, Abdomen Soft, No tenderness, No organomegaly appriciated,No rebound -guarding or rigidity.  9.  No Cyanosis, Normal Skin Turgor, No Skin Rash or  Bruise.  10. Good muscle tone,  joints appear normal , no effusions, Normal ROM.  11. No Palpable Lymph Nodes in Neck or Axillae     Data Review:    CBC Recent Labs  Lab 01/03/21 1148  WBC 7.5  HGB 11.0*  HCT 33.5*  PLT 261  MCV 88.2  MCH 28.9  MCHC 32.8  RDW 13.2  LYMPHSABS 1.3  MONOABS 0.6  EOSABS 0.3  BASOSABS 0.1   ------------------------------------------------------------------------------------------------------------------  Chemistries  Recent Labs  Lab 01/03/21 1148  NA 134*  K 4.5  CL 104  CO2 23  GLUCOSE 167*  BUN 57*  CREATININE 2.19*  CALCIUM 9.6  AST 21  ALT 47*  ALKPHOS 98  BILITOT 0.3   ------------------------------------------------------------------------------------------------------------------ estimated creatinine clearance is 23.5 mL/min (A) (by C-G formula based on SCr of 2.19 mg/dL (H)). ------------------------------------------------------------------------------------------------------------------ No results for input(s): TSH, T4TOTAL, T3FREE, THYROIDAB in the last 72 hours.  Invalid input(s): FREET3  Coagulation profile No results for input(s): INR, PROTIME in the last 168 hours. ------------------------------------------------------------------------------------------------------------------- No results for input(s): DDIMER in the last 72 hours. -------------------------------------------------------------------------------------------------------------------  Cardiac Enzymes No results for input(s): CKMB, TROPONINI, MYOGLOBIN in the last 168 hours.  Invalid input(s): CK ------------------------------------------------------------------------------------------------------------------ No results found for: BNP   ---------------------------------------------------------------------------------------------------------------  Urinalysis    Component Value Date/Time   COLORURINE YELLOW 07/25/2019 0508   APPEARANCEUR  HAZY (A) 07/25/2019 0508   LABSPEC 1.009 07/25/2019 0508   PHURINE 5.0 07/25/2019 0508   GLUCOSEU 50 (A) 07/25/2019 0508   HGBUR MODERATE (A) 07/25/2019 0508   BILIRUBINUR NEGATIVE 07/25/2019 0508   KETONESUR NEGATIVE 07/25/2019 0508   PROTEINUR 30 (A) 07/25/2019 0508   UROBILINOGEN 0.2 06/23/2011 1100   NITRITE NEGATIVE 07/25/2019 0508   LEUKOCYTESUR MODERATE (A) 07/25/2019 0508    ----------------------------------------------------------------------------------------------------------------   Imaging  Results:    CT Head Wo Contrast  Result Date: 01/03/2021 CLINICAL DATA:  TIA EXAM: CT HEAD WITHOUT CONTRAST TECHNIQUE: Contiguous axial images were obtained from the base of the skull through the vertex without intravenous contrast. COMPARISON:  01/16/2020 FINDINGS: Brain: No evidence of acute infarction, hemorrhage, hydrocephalus, extra-axial collection or mass lesion/mass effect. Evolution of small chronic infarct in the right parietooccipital region. Patchy low-density changes within the periventricular and subcortical white matter compatible with chronic microvascular ischemic change. Mild diffuse cerebral volume loss. Vascular: Atherosclerotic calcifications involving the large vessels of the skull base. No unexpected hyperdense vessel. Skull: Normal. Negative for fracture or focal lesion. Sinuses/Orbits: Chronic paranasal sinus disease, most pronounced within the right maxillary region, now progressively involving the right ethmoid air cells and right frontal sinuses. Other: None. IMPRESSION: 1. No acute intracranial findings. 2. Chronic microvascular ischemic change and cerebral volume loss. 3. Chronic paranasal sinus disease, most pronounced within the right maxillary sinus, now progressively involving the right ethmoid air cells and right frontal sinuses. Correlate for sinusitis. Electronically Signed   By: Davina Poke D.O.   On: 01/03/2021 13:54   MR BRAIN WO CONTRAST  Result  Date: 01/03/2021 CLINICAL DATA:  Transient ischemic attack (TIA) EXAM: MRI HEAD WITHOUT CONTRAST TECHNIQUE: Multiplanar, multiecho pulse sequences of the brain and surrounding structures were obtained without intravenous contrast. COMPARISON:  December 2021 FINDINGS: Brain: There is no acute infarction or intracranial hemorrhage. There is no intracranial mass, mass effect, or edema. There is no hydrocephalus or extra-axial fluid collection. Chronic cortical/subcortical infarct inferior right parietal lobe. Chronic small vessel infarcts of the deep gray nuclei and adjacent white matter, pons extending into the right brachium pontis, and corpus callosum. Punctate focus of susceptibility likely reflecting chronic microhemorrhage is again identified along the medial left temporal lobe. Additional patchy and confluent areas of T2 hyperintensity in the supratentorial and pontine white matter nonspecific but probably reflect moderate chronic microvascular ischemic changes. Vascular: Unchanged diminished right vertebral artery flow void. Skull and upper cervical spine: Normal marrow signal is preserved. Sinuses/Orbits: Paranasal sinus mucosal thickening with moderate right ethmoid opacification. Right maxillary sinus is chronically opacified. Bilateral lens replacements. Other: Sella is unremarkable.  Mastoid air cells are clear. IMPRESSION: No acute infarction, hemorrhage, or mass. Chronic infarcts and chronic microvascular ischemic changes as seen previously. Electronically Signed   By: Macy Mis M.D.   On: 01/03/2021 16:46    My personal review of EKG: Rhythm NSR,  Vent. rate 65 BPM PR interval 164 ms QRS duration 87 ms QT/QTcB 402/418 ms P-R-T axes 29 42 -7 Sinus rhythm Borderline T abnormalities, inferior leads  Assessment & Plan:    Principal Problem:   TIA (transient ischemic attack) Active Problems:   Type 2 diabetes mellitus with hyperlipidemia (HCC)   Essential hypertension   Hyperlipidemia    Uncontrolled type 2 diabetes mellitus with hyperglycemia (HCC)   TIA -With known history of CVA with residual right-sided deficit, presents with transient dysarthria and facial droop which has resolved -MRI brain/CT head with no evidence of acute infarct -Neurology input greatly appreciated, she will be admitted for TIA work-up, will obtain MRA head and neck (will avoid CTA given creatinine of 2), SLP/PT/OT evaluation, continue with aspirin and Plavix (she already took her Plavix this morning), check 2D echo, monitor on telemetry, check lipid panel and A1c  Diabetes mellitus -Check A1c, was hyperglycemic at home, will resume Lantus at a lower dose and continue with insulin sliding scale  Hypertension -We will  avoid hypotension, blood pressure is acceptable, continue with Coreg and clonidine for now and hold other medications  Hyperlipidemia -Check LDL and continue with home dose statin  CKD stage IV -Creatinine at baseline, continue to monitor    Diabetic polyneuropathy -Continue gabapentin   DVT Prophylaxis Heparin   AM Labs Ordered, also please review Full Orders  Family Communication: Admission, patients condition and plan of care including tests being ordered have been discussed with the patient and daughter who indicate understanding and agree with the plan and Code Status.  Code Status Full  Likely DC to  home  Condition GUARDED    Consults called: Tele neurology in ED    Admission status: observation    Time spent in minutes : 60 minutes   Phillips Climes M.D on 01/03/2021 at 6:07 PM   [facility] - Office  [phone]

## 2021-01-03 NOTE — ED Triage Notes (Addendum)
Pt to the ED from Home with hyperglycemia after receiving a flu shot. Pt states her blood sugar has been hard to regulate.   CBG From EMS was 200.  Pt's family states she had slurred speech this morning that has since resolved. Pt is A&O x4 during triage. Pt does have some loss of feeling on her right side due to a previous stroke.

## 2021-01-04 ENCOUNTER — Observation Stay (HOSPITAL_BASED_OUTPATIENT_CLINIC_OR_DEPARTMENT_OTHER): Payer: Medicare PPO

## 2021-01-04 DIAGNOSIS — G459 Transient cerebral ischemic attack, unspecified: Secondary | ICD-10-CM | POA: Diagnosis not present

## 2021-01-04 DIAGNOSIS — E669 Obesity, unspecified: Secondary | ICD-10-CM | POA: Diagnosis not present

## 2021-01-04 DIAGNOSIS — E1165 Type 2 diabetes mellitus with hyperglycemia: Secondary | ICD-10-CM | POA: Diagnosis not present

## 2021-01-04 DIAGNOSIS — E1169 Type 2 diabetes mellitus with other specified complication: Secondary | ICD-10-CM | POA: Diagnosis not present

## 2021-01-04 DIAGNOSIS — E66811 Obesity, class 1: Secondary | ICD-10-CM

## 2021-01-04 DIAGNOSIS — E785 Hyperlipidemia, unspecified: Secondary | ICD-10-CM | POA: Diagnosis not present

## 2021-01-04 DIAGNOSIS — I10 Essential (primary) hypertension: Secondary | ICD-10-CM | POA: Diagnosis not present

## 2021-01-04 LAB — HIV ANTIBODY (ROUTINE TESTING W REFLEX): HIV Screen 4th Generation wRfx: NONREACTIVE

## 2021-01-04 LAB — LIPID PANEL
Cholesterol: 110 mg/dL (ref 0–200)
HDL: 43 mg/dL (ref >40)
LDL Cholesterol: 16 mg/dL (ref 0–99)
Total CHOL/HDL Ratio: 2.6 RATIO
Triglycerides: 253 mg/dL — ABNORMAL HIGH (ref <150)
VLDL: 51 mg/dL — ABNORMAL HIGH (ref 0–40)

## 2021-01-04 LAB — ECHOCARDIOGRAM COMPLETE
AR max vel: 2.14 cm2
AV Area VTI: 2.89 cm2
AV Area mean vel: 1.76 cm2
AV Mean grad: 3.3 mmHg
AV Peak grad: 5.8 mmHg
Ao pk vel: 1.21 m/s
Area-P 1/2: 1.46 cm2
Height: 61 in
S' Lateral: 2.1 cm
Weight: 2955.93 oz

## 2021-01-04 LAB — URINALYSIS, ROUTINE W REFLEX MICROSCOPIC
Bilirubin Urine: NEGATIVE
Glucose, UA: >=500 mg/dL — AB
Hgb urine dipstick: NEGATIVE
Ketones, ur: NEGATIVE mg/dL
Nitrite: NEGATIVE
Protein, ur: NEGATIVE mg/dL
Specific Gravity, Urine: 1.015 (ref 1.005–1.030)
pH: 6 (ref 5.0–8.0)

## 2021-01-04 LAB — GLUCOSE, CAPILLARY
Glucose-Capillary: 85 mg/dL (ref 70–99)
Glucose-Capillary: 467 mg/dL — ABNORMAL HIGH (ref 70–99)
Glucose-Capillary: 428 mg/dL — ABNORMAL HIGH (ref 70–99)
Glucose-Capillary: 342 mg/dL — ABNORMAL HIGH (ref 70–99)
Glucose-Capillary: 199 mg/dL — ABNORMAL HIGH (ref 70–99)

## 2021-01-04 LAB — URINALYSIS, MICROSCOPIC (REFLEX)

## 2021-01-04 LAB — HEMOGLOBIN A1C
Hgb A1c MFr Bld: 10.4 % — ABNORMAL HIGH (ref 4.8–5.6)
Mean Plasma Glucose: 251.78 mg/dL

## 2021-01-04 LAB — GLUCOSE, RANDOM: Glucose, Bld: 485 mg/dL — ABNORMAL HIGH (ref 70–99)

## 2021-01-04 MED ORDER — ASPIRIN 81 MG PO CHEW: 81.0000 mg | CHEWABLE_TABLET | Freq: Every day | ORAL | 2 refills | Status: DC

## 2021-01-04 MED ORDER — CARVEDILOL 25 MG PO TABS: 25.0000 mg | ORAL_TABLET | Freq: Two times a day (BID) | ORAL | 3 refills | Status: AC

## 2021-01-04 MED ORDER — INSULIN GLARGINE 100 UNITS/ML SOLOSTAR PEN: 14.0000 [IU] | PEN_INJECTOR | Freq: Two times a day (BID) | SUBCUTANEOUS | 3 refills | Status: AC

## 2021-01-04 MED ORDER — PERFLUTREN LIPID MICROSPHERE
1.0000 mL | INTRAVENOUS | Status: AC | PRN
  Administered 2021-01-04: 3 mL via INTRAVENOUS
  Filled 2021-01-04: qty 10

## 2021-01-04 MED ORDER — SODIUM CHLORIDE 0.9 % IV BOLUS
1000.0000 mL | Freq: Once | INTRAVENOUS | Status: AC
  Administered 2021-01-04: 1000 mL via INTRAVENOUS

## 2021-01-04 MED ORDER — INSULIN GLARGINE-YFGN 100 UNIT/ML ~~LOC~~ SOLN
14.0000 [IU] | Freq: Two times a day (BID) | SUBCUTANEOUS | Status: DC
  Administered 2021-01-04: 14 [IU] via SUBCUTANEOUS
  Filled 2021-01-04 (×7): qty 0.14

## 2021-01-04 NOTE — Evaluation (Signed)
Occupational Therapy Evaluation Patient Details Name: Briana Garrison MRN: [id number] DOB: Jun 27, 1950 Today's Date: 01/04/2021   History of Present Illness Briana Garrison  is a 70 y.o. female, with medical history of diabetes mellitus type 2, hypertension, hyperlipidemia, small vessel strokes  -Patient presents to ED secondary to elevated blood sugar, slurred speech and facial droop, daughter reports patient been having hyperglycemia since her influenza shot, she does report increased thirst and urination, no nausea, vomiting, diarrhea, daughter reports this morning she noticed her mom to have right-sided facial droop, and worsening slurred speech, which has resolved within 1 hour, reports patient currently back at baseline, daughter reports patient is compliant with her medication including Plavix, insulin and antihypertensive regimen, she was concerned about stroke so she brought her to ED  - in ED back at her baseline, CT head/MRI brain with no acute findings, work-up significant for attending of 2.1 around her baseline, blood glucose of 167, blood pressure is acceptable, patient was seen by teleneurology who recommended admission for TIA work-up.   Clinical Impression   Pt agreeable to OT/PT co-evaluation. Pt reports that increased numbness in face persists but numbness on L side is typical at baseline. Pt demonstrates bed mobility and lower body dressing with Mod I level of assist. Pt demonstrates fair standing balance with quad cane and mod I level of assist for functional transfers. Pt demonstrates WFL B UE functional use and strength. Pt is not recommended for further acute OT services and will be discharged to care of nursing staff for remaining length of stay.      Recommendations for follow up therapy are one component of a multi-disciplinary discharge planning process, led by the attending physician.  Recommendations may be updated based on patient status, additional functional criteria and  insurance authorization.   Follow Up Recommendations  No OT follow up    Assistance Recommended at Discharge PRN  Functional Status Assessment  Patient has not had a recent decline in their functional status  Equipment Recommendations  None recommended by OT    Recommendations for Other Services       Precautions / Restrictions Precautions Precautions: None Restrictions Weight Bearing Restrictions: No      Mobility Bed Mobility Overal bed mobility: Modified Independent                  Transfers Overall transfer level: Modified independent                 General transfer comment: Pt mod I with use of quad cane for EOB to chair and chair to ambulation in hall and return to chair.      Balance Overall balance assessment: Needs assistance Sitting-balance support: Feet supported;No upper extremity supported Sitting balance-Leahy Scale: Good Sitting balance - Comments: seated EOB   Standing balance support: Single extremity supported;During functional activity Standing balance-Leahy Scale: Fair Standing balance comment: fair to good with cane                           ADL either performed or assessed with clinical judgement   ADL Overall ADL's : Modified independent                                       General ADL Comments: Pt able to doff and on sock and transfer with mod I level of assist.  Vision Baseline Vision/History: 1 Wears glasses Ability to See in Adequate Light: 0 Adequate (with glasses) Patient Visual Report: No change from baseline Vision Assessment?: No apparent visual deficits     Perception     Praxis      Pertinent Vitals/Pain Pain Assessment: No/denies pain     Hand Dominance Right   Extremity/Trunk Assessment Upper Extremity Assessment Upper Extremity Assessment: Overall WFL for tasks assessed   Lower Extremity Assessment Lower Extremity Assessment: Defer to PT evaluation   Cervical  / Trunk Assessment Cervical / Trunk Assessment: Normal   Communication Communication Communication: No difficulties   Cognition Arousal/Alertness: Awake/alert Behavior During Therapy: WFL for tasks assessed/performed Overall Cognitive Status: Within Functional Limits for tasks assessed                                       General Comments  Pt reoprts numbness on L side at baseline but increased numbness in L side of face.    Exercises     Shoulder Instructions      Home Living Family/patient expects to be discharged to:: Private residence Living Arrangements: Spouse/significant other;Children Available Help at Discharge: Family;Available 24 hours/day Type of Home: House Home Access: Level entry     Home Layout: One level     Bathroom Shower/Tub: Teacher, early years/pre: Handicapped height     Home Equipment: Conservation officer, nature (2 wheels);Cane - quad;BSC/3in1;Shower seat          Prior Functioning/Environment Prior Level of Function : Needs assist             Mobility Comments: Pt uses cane for household and community ambulation. ADLs Comments: Pt reportedly has daughter present for safety during bathing transfer but is indepdnent with ADL tasks once in position.   Assist for cleaning. Indpendent cooking. Gets grocieries with daughter.        OT Problem List:        OT Treatment/Interventions:      OT Goals(Current goals can be found in the care plan section) Acute Rehab OT Goals Patient Stated Goal: return home  OT Frequency:     Barriers to D/C:            Co-evaluation PT/OT/SLP Co-Evaluation/Treatment: Yes Reason for Co-Treatment: To address functional/ADL transfers   OT goals addressed during session: ADL's and self-care      AM-PAC OT "6 Clicks" Daily Activity     Outcome Measure Help from another person eating meals?: None Help from another person taking care of personal grooming?: None Help from another  person toileting, which includes using toliet, bedpan, or urinal?: None Help from another person bathing (including washing, rinsing, drying)?: A Little Help from another person to put on and taking off regular upper body clothing?: None Help from another person to put on and taking off regular lower body clothing?: None 6 Click Score: 23   End of Session Equipment Utilized During Treatment: Other (comment) (quad cane)  Activity Tolerance: Patient tolerated treatment well Patient left: in chair;with call bell/phone within reach;with family/visitor present  OT Visit Diagnosis: Unsteadiness on feet (R26.81)                Time: 3212-2482 OT Time Calculation (min): 15 min Charges:  OT General Charges $OT Visit: 1 Visit OT Evaluation $OT Eval Low Complexity: 1 Low  Skarlet Lyons OT, MOT  Larey Seat 01/04/2021, 9:45  AM

## 2021-01-04 NOTE — Plan of Care (Signed)
?  Problem: Education: ?Goal: Knowledge of disease or condition will improve ?Outcome: Progressing ?Goal: Knowledge of secondary prevention will improve (SELECT ALL) ?Outcome: Progressing ?Goal: Knowledge of patient specific risk factors will improve (INDIVIDUALIZE FOR PATIENT) ?Outcome: Progressing ?  ?

## 2021-01-04 NOTE — Progress Notes (Signed)
*  PRELIMINARY RESULTS* Echocardiogram 2D Echocardiogram has been performed with Definity.  Samuel Germany 01/04/2021, 3:17 PM

## 2021-01-04 NOTE — Care Management Obs Status (Signed)
[place] NOTIFICATION   Patient Details  Name: Briana Garrison MRN: [id number] Date of Birth: 1950-04-11   Medicare Observation Status Notification Given:  Yes    Tommy Medal 01/04/2021, 3:53 PM

## 2021-01-04 NOTE — Progress Notes (Signed)
SLP Cancellation Note  Patient Details Name: Briana Garrison MRN: [id number] DOB: January 07, 1951   Cancelled treatment:       Reason Eval/Treat Not Completed: SLP screened, no needs identified, will sign off; MRI/CT negative for acute processes; pt's symptoms have resolved.  No ST needs revealed.    Elvina Sidle, M.S., CCC-SLP 01/04/2021, 11:44 AM

## 2021-01-04 NOTE — Evaluation (Signed)
Physical Therapy Evaluation Patient Details Name: Briana Garrison MRN: [id number] DOB: 1950/04/02 Today's Date: 01/04/2021  History of Present Illness  Briana Garrison  is a 70 y.o. female, with medical history of diabetes mellitus type 2, hypertension, hyperlipidemia, small vessel strokes  -Patient presents to ED secondary to elevated blood sugar, slurred speech and facial droop, daughter reports patient been having hyperglycemia since her influenza shot, she does report increased thirst and urination, no nausea, vomiting, diarrhea, daughter reports this morning she noticed her mom to have right-sided facial droop, and worsening slurred speech, which has resolved within 1 hour, reports patient currently back at baseline, daughter reports patient is compliant with her medication including Plavix, insulin and antihypertensive regimen, she was concerned about stroke so she brought her to ED  - in ED back at her baseline, CT head/MRI brain with no acute findings, work-up significant for attending of 2.1 around her baseline, blood glucose of 167, blood pressure is acceptable, patient was seen by teleneurology who recommended admission for TIA work-up.   Clinical Impression  Patient functioning near baseline for functional mobility and gait demonstrating good return for ambulation in room and hallway without loss of balance using quad-cane.  Patient encouraged to ambulate as tolerated with family/nursing staff supervising.  Plan:  Patient discharged from physical therapy to care of nursing for ambulation daily as tolerated for length of stay.         Recommendations for follow up therapy are one component of a multi-disciplinary discharge planning process, led by the attending physician.  Recommendations may be updated based on patient status, additional functional criteria and insurance authorization.  Follow Up Recommendations Outpatient PT    Assistance Recommended at Discharge PRN  Functional Status  Assessment Patient has had a recent decline in their functional status and demonstrates the ability to make significant improvements in function in a reasonable and predictable amount of time.  Equipment Recommendations  None recommended by PT    Recommendations for Other Services       Precautions / Restrictions Precautions Precautions: None Restrictions Weight Bearing Restrictions: No      Mobility  Bed Mobility Overal bed mobility: Modified Independent                  Transfers Overall transfer level: Modified independent                      Ambulation/Gait Ambulation/Gait assistance: Modified independent (Device/Increase time) Gait Distance (Feet): 80 Feet Assistive device: Quad cane Gait Pattern/deviations: Step-to pattern;Decreased step length - right;Decreased stance time - right;Decreased step length - left;Decreased stride length Gait velocity: decreased     General Gait Details: demonstrates good return for ambulation in room and hallway without loss of balance with mostly step-to pattern  Stairs            Wheelchair Mobility    Modified Rankin (Stroke Patients Only)       Balance Overall balance assessment: Needs assistance Sitting-balance support: Feet supported;No upper extremity supported Sitting balance-Leahy Scale: Good Sitting balance - Comments: seated EOB   Standing balance support: Single extremity supported;During functional activity Standing balance-Leahy Scale: Fair Standing balance comment: fair/good uwing quad-cane                             Pertinent Vitals/Pain Pain Assessment: No/denies pain    Home Living Family/patient expects to be discharged to:: Private residence Living Arrangements: Spouse/significant other;Children  Available Help at Discharge: Family;Available 24 hours/day Type of Home: House Home Access: Level entry       Home Layout: One level Home Equipment: Conservation officer, nature (2  wheels);Cane - quad;BSC/3in1;Shower seat      Prior Function Prior Level of Function : Needs assist       Physical Assist : Mobility (physical)     Mobility Comments: Pt uses cane for household and community ambulation. ADLs Comments: Pt reportedly has daughter present for safety during bathing transfer but is indepdnent with ADL tasks once in position.   Assist for cleaning. Indpendent cooking. Gets grocieries with daughter.     Hand Dominance   Dominant Hand: Right    Extremity/Trunk Assessment   Upper Extremity Assessment Upper Extremity Assessment: Defer to OT evaluation    Lower Extremity Assessment Lower Extremity Assessment: Overall WFL for tasks assessed    Cervical / Trunk Assessment Cervical / Trunk Assessment: Normal  Communication   Communication: No difficulties  Cognition Arousal/Alertness: Awake/alert Behavior During Therapy: WFL for tasks assessed/performed Overall Cognitive Status: Within Functional Limits for tasks assessed                                          General Comments      Exercises     Assessment/Plan    PT Assessment All further PT needs can be met in the next venue of care  PT Problem List Decreased strength;Decreased activity tolerance;Decreased balance;Decreased mobility       PT Treatment Interventions      PT Goals (Current goals can be found in the Care Plan section)  Acute Rehab PT Goals Patient Stated Goal: return home with family to assist PT Goal Formulation: With patient/family Time For Goal Achievement: 01/04/21 Potential to Achieve Goals: Good    Frequency     Barriers to discharge        Co-evaluation PT/OT/SLP Co-Evaluation/Treatment: Yes Reason for Co-Treatment: To address functional/ADL transfers PT goals addressed during session: Mobility/safety with mobility;Balance;Proper use of DME         AM-PAC PT "6 Clicks" Mobility  Outcome Measure Help needed turning from your back  to your side while in a flat bed without using bedrails?: None Help needed moving from lying on your back to sitting on the side of a flat bed without using bedrails?: None Help needed moving to and from a bed to a chair (including a wheelchair)?: None Help needed standing up from a chair using your arms (e.g., wheelchair or bedside chair)?: None Help needed to walk in hospital room?: A Little Help needed climbing 3-5 steps with a railing? : A Little 6 Click Score: 22    End of Session   Activity Tolerance: Patient tolerated treatment well;Patient limited by fatigue Patient left: in chair;with call bell/phone within reach;with family/visitor present Nurse Communication: Mobility status PT Visit Diagnosis: Unsteadiness on feet (R26.81);Other abnormalities of gait and mobility (R26.89);Muscle weakness (generalized) (M62.81)    Time: 3729-0211 PT Time Calculation (min) (ACUTE ONLY): 19 min   Charges:   PT Evaluation $PT Eval Low Complexity: 1 Low PT Treatments $Gait Training: 8-22 mins        2:01 PM, 01/04/21 Lonell Grandchild, MPT Physical Therapist with [facility] [phone] office [phone] mobile phone

## 2021-01-04 NOTE — Discharge Summary (Signed)
Physician Discharge Summary  Briana Garrison XLK:[id number] DOB: April 09, 1950 DOA: 01/03/2021  PCP: [name], MD  Admit date: 01/03/2021 Discharge date: 01/04/2021  Time spent: 35 minutes  Recommendations for Outpatient Follow-up:  Repeat basic metabolic panel to follow electrolytes and renal function Reassess blood pressure and further adjust antihypertensive regimen as needed Continue close monitoring of patient's CBGs and/A1c with further adjustment to hypoglycemia regimen as required.  Discharge Diagnoses:  Principal Problem:   TIA (transient ischemic attack) Active Problems:   Type 2 diabetes mellitus with hyperlipidemia (HCC)   Essential hypertension   Hyperlipidemia   Uncontrolled type 2 diabetes mellitus with hyperglycemia (HCC)   Class 1 obesity Chronic diastolic heart failure  Discharge Condition: Stable and improved.  Discharged home with instruction to follow-up with PCP in 10 days.  Outpatient physical therapy rehabilitation recommended.  CODE STATUS: Full code.  Diet recommendation: Heart healthy, modified carbohydrates and low-calorie diet.  Filed Weights   01/03/21 1148 01/03/21 1944  Weight: 83.9 kg 83.8 kg    History of present illness:  As per H&P written by Dr. Waldron Labs on 01/03/21  Briana Garrison  is a 70 y.o. female, with medical history of diabetes mellitus type 2, hypertension, hyperlipidemia, small vessel strokes -Patient presents to ED secondary to elevated blood sugar, slurred speech and facial droop, daughter reports patient been having hyperglycemia since her influenza shot, she does report increased thirst and urination, no nausea, vomiting, diarrhea, daughter reports this morning she noticed her mom to have right-sided facial droop, and worsening slurred speech, which has resolved within 1 hour, reports patient currently back at baseline, daughter reports patient is compliant with her medication including Plavix, insulin and antihypertensive  regimen, she was concerned about stroke so she brought her to ED - in ED back at her baseline, CT head/MRI brain with no acute findings, work-up significant for attending of 2.1 around her baseline, blood glucose of 167, blood pressure is acceptable, patient was seen by teleneurology who recommended admission for TIA work-up.    Hospital Course:  1-TIA -Patient with known history of CVA with residual right-sided deficits presenting with dysarthria and facial droop which was resolved by time of her admission. -CT scan negative for acute abnormalities and MRI not demonstrating acute infarct. -Patient found to be significantly hyperglycemic. -Given risk factors after discussion with teleneurology decision was made to place in the hospital to complete TIA work-up. -TIA without significant valvular disorder or thrombi; stable multiple stenosis appreciated on her MRA head and neck. And blood pressure control. -Physical therapy has recommended outpatient rehabilitation. -Patient will use aspirin and Plavix for secondary prevention;   2-uncontrolled type 2 diabetes with nephropathy and hyperglycemia -A1c 10.4 -Patient with recent increase in current Trulicity dose; will also increase patient dose of Lantus to 14 units twice a day. -Advised to follow modified carbohydrate diet. -Continue close monitoring of patient's CBGs with further adjustment to hypoglycemia regimen as required.  3-hypertension -Resume home antihypertensive regimen with adjustment of her carvedilol to 25 mg twice a day. -Advised to follow heart healthy diet.  4-hyperlipidemia -Continue Crestor.  5-chronic kidney disease a stage IV -Creatinine appears to be stable and at baseline -For hydration -Repeat basic metabolic panel follow-up visit to assess stability.  6-diabetic neuropathy -Resume the use of gabapentin.  7-class I obesity -Low calorie diet, portion control and increase physical activity discussed with  patient. -Body mass index is 34.91 kg/m.  8-chronic diastolic heart failure -Class I -Compensated -Continue good control of blood pressure, daily  weights and low-sodium diet.   Procedures: See below for x-ray report 2D echo:   1. Left ventricular ejection fraction, by estimation, is 70 to 75%. The  left ventricle has normal function. The left ventricle has no regional  wall motion abnormalities. There is severe left ventricular hypertrophy.  Left ventricular diastolic parameters   are consistent with Grade I diastolic dysfunction (impaired relaxation).  Elevated left atrial pressure.   2. Right ventricular systolic function is normal. The right ventricular  size is normal.   3. The mitral valve is normal in structure. No evidence of mitral valve  regurgitation. No evidence of mitral stenosis.   4. The aortic valve is tricuspid. Aortic valve regurgitation is not  visualized. No aortic stenosis is present.   5. The inferior vena cava is normal in size with greater than 50%  respiratory variability, suggesting right atrial pressure of 3 mmHg.    Consultations: Teleneurology  Discharge Exam: Vitals:   01/04/21 0340 01/04/21 0618  BP: (!) 166/72 (!) 176/64  Pulse: 67 60  Resp: 19 20  Temp: 97.9 F (36.6 C) 98 F (36.7 C)  SpO2: 100% 99%    General: Afebrile, no chest pain, no nausea, no vomiting.  Oriented x3 and without focal deficits neurologically speaking.  Cardiovascular: S1 and S2, no rubs, no gallops, no JVD Respiratory: Clear to auscultation bilaterally; good saturation on room air. Abdomen: Soft, nontender, nondistended, positive bowel sounds Extremities: No cyanosis or clubbing.  Discharge Instructions   Discharge Instructions     (HEART FAILURE PATIENTS) Call MD:  Anytime you have any of the following symptoms: 1) 3 pound weight gain in 24 hours or 5 pounds in 1 week 2) shortness of breath, with or without a dry hacking cough 3) swelling in the hands,  feet or stomach 4) if you have to sleep on extra pillows at night in order to breathe.   Complete by: As directed    Diet - low sodium heart healthy   Complete by: As directed    Discharge instructions   Complete by: As directed    Take medications as prescribed Arrange follow-up with PCP in 10 days Noticed request for increase insulin dose using Lantus 14 units twice a day and instruction to follow modified carbohydrate diet. -Blood pressure medications has also been adjusted (carvedilol asking to take 25 mg twice a day).  Follow low-sodium diet. -Check your weight on daily basis.      Allergies as of 01/04/2021       Reactions   Amlodipine Swelling   Tramadol Diarrhea, Nausea And Vomiting        Medication List     STOP taking these medications    atorvastatin 80 MG tablet Commonly known as: LIPITOR       TAKE these medications    acetaminophen 500 MG tablet Commonly known as: TYLENOL Take 500 mg by mouth every 6 (six) hours as needed for mild pain or headache.   albuterol 108 (90 Base) MCG/ACT inhaler Commonly known as: VENTOLIN HFA Inhale 2 puffs into the lungs every 6 (six) hours as needed for wheezing or shortness of breath.   aspirin 81 MG chewable tablet Chew 1 tablet (81 mg total) by mouth daily. Start taking on: January 05, 2021   carvedilol 25 MG tablet Commonly known as: COREG Take 1 tablet (25 mg total) by mouth 2 (two) times daily. What changed:  how much to take Another medication with the same name was removed. Continue taking  this medication, and follow the directions you see here.   cetirizine 10 MG tablet Commonly known as: ZYRTEC Take 10 mg by mouth daily as needed for allergies.   cloNIDine 0.1 MG tablet Commonly known as: CATAPRES Take 0.1 mg by mouth See admin instructions. Take 1 and 1/2 tablet twice a day   clopidogrel 75 MG tablet Commonly known as: PLAVIX Take 1 tablet by mouth once daily What changed:  when to take  this additional instructions Another medication with the same name was removed. Continue taking this medication, and follow the directions you see here.   furosemide 20 MG tablet Commonly known as: LASIX Take 40 mg by mouth 2 (two) times daily.   gabapentin 100 MG capsule Commonly known as: NEURONTIN Take 100 mg by mouth daily as needed (shoulder pain).   glucose 4 GM chewable tablet Chew 1 tablet by mouth once as needed for low blood sugar.   insulin glargine 100 unit/mL Sopn Commonly known as: LANTUS Inject 14 Units into the skin 2 (two) times daily. 14 units am    6 units bedtime What changed: how much to take   NovoLOG FlexPen 100 UNIT/ML FlexPen Generic drug: insulin aspart Inject 4-12 Units into the skin 3 (three) times daily with meals. Per sliding scale - based on CBG   rosuvastatin 40 MG tablet Commonly known as: CRESTOR Take 40 mg by mouth daily.   senna-docusate 8.6-50 MG tablet Commonly known as: Senokot-S Take 1 tablet by mouth daily as needed for mild constipation.   telmisartan 20 MG tablet Commonly known as: MICARDIS Take 20 mg by mouth daily.   Trulicity 4.5 UJ/8.1XB Sopn Generic drug: Dulaglutide Inject 4.5 mg into the skin every 7 (seven) days. What changed: Another medication with the same name was removed. Continue taking this medication, and follow the directions you see here.   vitamin B-12 1000 MCG tablet Commonly known as: CYANOCOBALAMIN Take 1,000 mcg by mouth daily.   Vitamin D-3 25 MCG (1000 UT) Caps Take 1,000 Units by mouth daily.       Allergies  Allergen Reactions   Amlodipine Swelling   Tramadol Diarrhea and Nausea And Vomiting    Follow-up Information     [facility] Follow up.   Why: Referral was sent to [facility]. They will call you to schedule appointments. Please call them if you do not receive a call. Contact information: [place], [place]        [name], MD. Schedule an  appointment as soon as possible for a visit in 10 day(s).   Specialty: Internal Medicine Contact information: [address] [place] [address] [phone]                 The results of significant diagnostics from this hospitalization (including imaging, microbiology, ancillary and laboratory) are listed below for reference.    Significant Diagnostic Studies: CT Head Wo Contrast  Result Date: 01/03/2021 CLINICAL DATA:  TIA EXAM: CT HEAD WITHOUT CONTRAST TECHNIQUE: Contiguous axial images were obtained from the base of the skull through the vertex without intravenous contrast. COMPARISON:  01/16/2020 FINDINGS: Brain: No evidence of acute infarction, hemorrhage, hydrocephalus, extra-axial collection or mass lesion/mass effect. Evolution of small chronic infarct in the right parietooccipital region. Patchy low-density changes within the periventricular and subcortical white matter compatible with chronic microvascular ischemic change. Mild diffuse cerebral volume loss. Vascular: Atherosclerotic calcifications involving the large vessels of the skull base. No unexpected hyperdense vessel. Skull:  Normal. Negative for fracture or focal lesion. Sinuses/Orbits: Chronic paranasal sinus disease, most pronounced within the right maxillary region, now progressively involving the right ethmoid air cells and right frontal sinuses. Other: None. IMPRESSION: 1. No acute intracranial findings. 2. Chronic microvascular ischemic change and cerebral volume loss. 3. Chronic paranasal sinus disease, most pronounced within the right maxillary sinus, now progressively involving the right ethmoid air cells and right frontal sinuses. Correlate for sinusitis. Electronically Signed   By: Davina Poke D.O.   On: 01/03/2021 13:54   MR ANGIO HEAD WO CONTRAST  Result Date: 01/03/2021 CLINICAL DATA:  TIA EXAM: MRA NECK WITHOUT CONTRAST MRA HEAD WITHOUT CONTRAST TECHNIQUE: Angiographic images of the  Circle of Willis were acquired using MRA technique without intravenous contrast. COMPARISON:  01/16/2020 MRA head, 12/05/2019 MRA head and neck, correlation is also made with same day MRI head. FINDINGS: MRA NECK FINDINGS Visualized carotid arteries are patent, without significant stenosis. The left vertebral artery is patent throughout its imaged course. Multifocal irregularity and poor signal in the right vertebral artery, overall similar to the prior exam. MRA HEAD FINDINGS Both internal carotid arteries are patent to the termini, with mild irregularity but without significant stenosis or other abnormality. A1 segments patent. Normal anterior communicating artery. Anterior cerebral arteries are patent to their distal aspects. Redemonstrated moderate stenosis at the distal right M1 (series 5, image 114), as well as in the proximal inferior division left M2 (series 5, image 111). The left M1 is patent. Redemonstrated severe focal stenosis in a distal left MCA branch (series 5, image 148) normal MCA bifurcations. Distal MCA branches perfused and symmetric. The right V4 is irregular and minimally patent to the vertebrobasilar junction. The left vertebral artery is patent to the vertebrobasilar junction, without stenosis. Posterior inferior cerebral arteries patent bilaterally. Basilar patent to its distal aspect. Superior cerebellar arteries patent bilaterally. PCAs perfused to their distal aspects with moderate irregularity and redemonstrated focal stenosis at the right P2-P3 junction (series 5, image 109). The bilateral posterior communicating arteries are not definitively visualized. IMPRESSION: 1. Multifocal irregularity and poor signal the right vertebral artery, both intra and extra cranially, likely multifocal stenosis, overall similar to the prior exam. No other significant stenosis in the neck. 2. Redemonstrated moderate stenosis of the distal right M1 and in the proximal inferior division of the left M2. In  addition, there is severe focal stenosis in a distal left MCA branch. 3. Moderate irregularity in the bilateral PCAs, with focal stenosis at the right P2-P3 junction. 4. No intracranial large vessel occlusion. Electronically Signed   By: Merilyn Baba M.D.   On: 01/03/2021 19:37   MR ANGIO NECK WO CONTRAST  Result Date: 01/03/2021 CLINICAL DATA:  TIA EXAM: MRA NECK WITHOUT CONTRAST MRA HEAD WITHOUT CONTRAST TECHNIQUE: Angiographic images of the Circle of Willis were acquired using MRA technique without intravenous contrast. COMPARISON:  01/16/2020 MRA head, 12/05/2019 MRA head and neck, correlation is also made with same day MRI head. FINDINGS: MRA NECK FINDINGS Visualized carotid arteries are patent, without significant stenosis. The left vertebral artery is patent throughout its imaged course. Multifocal irregularity and poor signal in the right vertebral artery, overall similar to the prior exam. MRA HEAD FINDINGS Both internal carotid arteries are patent to the termini, with mild irregularity but without significant stenosis or other abnormality. A1 segments patent. Normal anterior communicating artery. Anterior cerebral arteries are patent to their distal aspects. Redemonstrated moderate stenosis at the distal right M1 (series 5, image 114), as well  as in the proximal inferior division left M2 (series 5, image 111). The left M1 is patent. Redemonstrated severe focal stenosis in a distal left MCA branch (series 5, image 148) normal MCA bifurcations. Distal MCA branches perfused and symmetric. The right V4 is irregular and minimally patent to the vertebrobasilar junction. The left vertebral artery is patent to the vertebrobasilar junction, without stenosis. Posterior inferior cerebral arteries patent bilaterally. Basilar patent to its distal aspect. Superior cerebellar arteries patent bilaterally. PCAs perfused to their distal aspects with moderate irregularity and redemonstrated focal stenosis at the right  P2-P3 junction (series 5, image 109). The bilateral posterior communicating arteries are not definitively visualized. IMPRESSION: 1. Multifocal irregularity and poor signal the right vertebral artery, both intra and extra cranially, likely multifocal stenosis, overall similar to the prior exam. No other significant stenosis in the neck. 2. Redemonstrated moderate stenosis of the distal right M1 and in the proximal inferior division of the left M2. In addition, there is severe focal stenosis in a distal left MCA branch. 3. Moderate irregularity in the bilateral PCAs, with focal stenosis at the right P2-P3 junction. 4. No intracranial large vessel occlusion. Electronically Signed   By: Merilyn Baba M.D.   On: 01/03/2021 19:37   MR BRAIN WO CONTRAST  Result Date: 01/03/2021 CLINICAL DATA:  Transient ischemic attack (TIA) EXAM: MRI HEAD WITHOUT CONTRAST TECHNIQUE: Multiplanar, multiecho pulse sequences of the brain and surrounding structures were obtained without intravenous contrast. COMPARISON:  December 2021 FINDINGS: Brain: There is no acute infarction or intracranial hemorrhage. There is no intracranial mass, mass effect, or edema. There is no hydrocephalus or extra-axial fluid collection. Chronic cortical/subcortical infarct inferior right parietal lobe. Chronic small vessel infarcts of the deep gray nuclei and adjacent white matter, pons extending into the right brachium pontis, and corpus callosum. Punctate focus of susceptibility likely reflecting chronic microhemorrhage is again identified along the medial left temporal lobe. Additional patchy and confluent areas of T2 hyperintensity in the supratentorial and pontine white matter nonspecific but probably reflect moderate chronic microvascular ischemic changes. Vascular: Unchanged diminished right vertebral artery flow void. Skull and upper cervical spine: Normal marrow signal is preserved. Sinuses/Orbits: Paranasal sinus mucosal thickening with moderate  right ethmoid opacification. Right maxillary sinus is chronically opacified. Bilateral lens replacements. Other: Sella is unremarkable.  Mastoid air cells are clear. IMPRESSION: No acute infarction, hemorrhage, or mass. Chronic infarcts and chronic microvascular ischemic changes as seen previously. Electronically Signed   By: Macy Mis M.D.   On: 01/03/2021 16:46   ECHOCARDIOGRAM COMPLETE  Result Date: 01/04/2021    ECHOCARDIOGRAM REPORT   Patient Name:   Briana Garrison Date of Exam: 01/04/2021 Medical Rec #:  [id number]       Height:       61.0 in Accession #:    [id number]      Weight:       184.7 lb Date of Birth:  [birth year]       BSA:          1.826 m Patient Age:    41 years        BP:           176/64 mmHg Patient Gender: F               HR:           60 bpm. Exam Location:  [facility] Procedure: 2D Echo, Cardiac Doppler and Color Doppler Indications:    TIA G45.9  History:  Patient has prior history of Echocardiogram examinations, most                 recent 12/06/2019.  Sonographer:    Alvino Chapel RCS Referring Phys: Muse  1. Left ventricular ejection fraction, by estimation, is 70 to 75%. The left ventricle has normal function. The left ventricle has no regional wall motion abnormalities. There is severe left ventricular hypertrophy. Left ventricular diastolic parameters  are consistent with Grade I diastolic dysfunction (impaired relaxation). Elevated left atrial pressure.  2. Right ventricular systolic function is normal. The right ventricular size is normal.  3. The mitral valve is normal in structure. No evidence of mitral valve regurgitation. No evidence of mitral stenosis.  4. The aortic valve is tricuspid. Aortic valve regurgitation is not visualized. No aortic stenosis is present.  5. The inferior vena cava is normal in size with greater than 50% respiratory variability, suggesting right atrial pressure of 3 mmHg. FINDINGS  Left Ventricle: Severe LVH  and hyperdynamic LV create a mild dynamic intracavitary gradient, peak 17 mmHg. Left ventricular ejection fraction, by estimation, is 70 to 75%. The left ventricle has normal function. The left ventricle has no regional wall motion abnormalities. Definity contrast agent was given IV to delineate the left ventricular endocardial borders. The left ventricular internal cavity size was normal in size. There is severe left ventricular hypertrophy. Left ventricular diastolic parameters are consistent with Grade I diastolic dysfunction (impaired relaxation). Elevated left atrial pressure. Right Ventricle: The right ventricular size is normal. No increase in right ventricular wall thickness. Right ventricular systolic function is normal. Left Atrium: Left atrial size was normal in size. Right Atrium: Right atrial size was normal in size. Pericardium: The pericardium was not well visualized. Mitral Valve: The mitral valve is normal in structure. No evidence of mitral valve regurgitation. No evidence of mitral valve stenosis. Tricuspid Valve: The tricuspid valve is normal in structure. Tricuspid valve regurgitation is not demonstrated. No evidence of tricuspid stenosis. Aortic Valve: The aortic valve is tricuspid. Aortic valve regurgitation is not visualized. No aortic stenosis is present. Aortic valve mean gradient measures 3.3 mmHg. Aortic valve peak gradient measures 5.8 mmHg. Aortic valve area, by VTI measures 2.89 cm. Pulmonic Valve: The pulmonic valve was not well visualized. Pulmonic valve regurgitation is not visualized. No evidence of pulmonic stenosis. Aorta: The aortic root is normal in size and structure. Venous: The inferior vena cava is normal in size with greater than 50% respiratory variability, suggesting right atrial pressure of 3 mmHg. IAS/Shunts: No atrial level shunt detected by color flow Doppler.  LEFT VENTRICLE PLAX 2D LVIDd:         4.00 cm   Diastology LVIDs:         2.10 cm   LV e' medial:    3.81  cm/s LV PW:         1.50 cm   LV E/e' medial:  19.1 LV IVS:        1.50 cm   LV e' lateral:   3.15 cm/s LVOT diam:     1.90 cm   LV E/e' lateral: 23.1 LV SV:         65 LV SV Index:   35 LVOT Area:     2.84 cm  RIGHT VENTRICLE RV S prime:     15.90 cm/s TAPSE (M-mode): 2.0 cm LEFT ATRIUM             Index  RIGHT ATRIUM           Index LA diam:        3.60 cm 1.97 cm/m   RA Area:     16.70 cm LA Vol (A2C):   42.1 ml 23.05 ml/m  RA Volume:   48.10 ml  26.34 ml/m LA Vol (A4C):   42.9 ml 23.49 ml/m LA Biplane Vol: 43.2 ml 23.66 ml/m  AORTIC VALVE AV Area (Vmax):    2.14 cm AV Area (Vmean):   1.76 cm AV Area (VTI):     2.89 cm AV Vmax:           120.53 cm/s AV Vmean:          88.120 cm/s AV VTI:            0.223 m AV Peak Grad:      5.8 mmHg AV Mean Grad:      3.3 mmHg LVOT Vmax:         90.80 cm/s LVOT Vmean:        54.800 cm/s LVOT VTI:          0.228 m LVOT/AV VTI ratio: 1.02  AORTA Ao Root diam: 3.30 cm MITRAL VALVE MV Area (PHT): 1.46 cm     SHUNTS MV Decel Time: 518 msec     Systemic VTI:  0.23 m MV E velocity: 72.70 cm/s   Systemic Diam: 1.90 cm MV A velocity: 132.00 cm/s MV E/A ratio:  0.55 Carlyle Dolly MD Electronically signed by Carlyle Dolly MD Signature Date/Time: 01/04/2021/3:41:44 PM    Final     Microbiology: Recent Results (from the past 240 hour(s))  Resp Panel by RT-PCR (Flu A&B, Covid) Nasopharyngeal Swab     Status: None   Collection Time: 01/03/21  6:54 PM   Specimen: Nasopharyngeal Swab; Nasopharyngeal(NP) swabs in vial transport medium  Result Value Ref Range Status   SARS Coronavirus 2 by RT PCR NEGATIVE NEGATIVE Final    Comment: (NOTE) SARS-CoV-2 target nucleic acids are NOT DETECTED.  The SARS-CoV-2 RNA is generally detectable in upper respiratory specimens during the acute phase of infection. The lowest concentration of SARS-CoV-2 viral copies this assay can detect is 138 copies/mL. A negative result does not preclude SARS-Cov-2 infection and should not be  used as the sole basis for treatment or other patient management decisions. A negative result may occur with  improper specimen collection/handling, submission of specimen other than nasopharyngeal swab, presence of viral mutation(s) within the areas targeted by this assay, and inadequate number of viral copies(<138 copies/mL). A negative result must be combined with clinical observations, patient history, and epidemiological information. The expected result is Negative.  Fact Sheet for Patients:  EntrepreneurPulse.com.au  Fact Sheet for Healthcare Providers:  IncredibleEmployment.be  This test is no t yet approved or cleared by the [place] FDA and  has been authorized for detection and/or diagnosis of SARS-CoV-2 by FDA under an Emergency Use Authorization (EUA). This EUA will remain  in effect (meaning this test can be used) for the duration of the COVID-19 declaration under Section 564(b)(1) of the Act, 21 U.S.C.section 360bbb-3(b)(1), unless the authorization is terminated  or revoked sooner.       Influenza A by PCR NEGATIVE NEGATIVE Final   Influenza B by PCR NEGATIVE NEGATIVE Final    Comment: (NOTE) The Xpert Xpress SARS-CoV-2/FLU/RSV plus assay is intended as an aid in the diagnosis of influenza from Nasopharyngeal swab specimens and should not be used as a sole basis  for treatment. Nasal washings and aspirates are unacceptable for Xpert Xpress SARS-CoV-2/FLU/RSV testing.  Fact Sheet for Patients: EntrepreneurPulse.com.au  Fact Sheet for Healthcare Providers: IncredibleEmployment.be  This test is not yet approved or cleared by the [place] FDA and has been authorized for detection and/or diagnosis of SARS-CoV-2 by FDA under an Emergency Use Authorization (EUA). This EUA will remain in effect (meaning this test can be used) for the duration of the COVID-19 declaration under Section  564(b)(1) of the Act, 21 U.S.C. section 360bbb-3(b)(1), unless the authorization is terminated or revoked.  Performed at [facility], [address], [place], [place] [address]      Labs: Basic Metabolic Panel: Recent Labs  Lab 01/03/21 1148 01/04/21 0605  NA 134*  --   K 4.5  --   CL 104  --   CO2 23  --   GLUCOSE 167* 485*  BUN 57*  --   CREATININE 2.19*  --   CALCIUM 9.6  --    Liver Function Tests: Recent Labs  Lab 01/03/21 1148  AST 21  ALT 47*  ALKPHOS 98  BILITOT 0.3  PROT 7.4  ALBUMIN 3.8   CBC: Recent Labs  Lab 01/03/21 1148  WBC 7.5  NEUTROABS 5.2  HGB 11.0*  HCT 33.5*  MCV 88.2  PLT 261   CBG: Recent Labs  Lab 01/03/21 2152 01/04/21 0757 01/04/21 1127 01/04/21 1243 01/04/21 1633  GLUCAP 85 467* 428* 342* 199*    Signed:  Barton Dubois MD.  [facility] 01/04/2021, 4:53 PM

## 2021-01-04 NOTE — TOC Transition Note (Signed)
Transition of Care [facility]) - CM/SW Discharge Note   Patient Details  Name: Briana Garrison MRN: [id number] Date of Birth: 02/05/1950  Transition of Care [facility]) CM/SW Contact:  Shade Flood, LCSW Phone Number: 01/04/2021, 12:21 PM   Clinical Narrative:     Pt admitted from home. PT recommending outpatient PT at dc. TOC spoke with pt and pt is agreeable. She states that she has used [facility] in [place] in the past. [place] and was informed that referral order and clinical could be faxed to them at [phone] and then they will follow up with pt to schedule.  Referral faxed and information added to AVS. There are no other TOC needs identified for dc.  Expected Discharge Plan: OP Rehab Barriers to Discharge: Barriers Resolved   Patient Goals and CMS Choice Patient states their goals for this hospitalization and ongoing recovery are:: go home CMS Medicare.gov Compare Post Acute Care list provided to:: Patient Choice offered to / list presented to : Patient  Expected Discharge Plan and Services Expected Discharge Plan: OP Rehab In-house Referral: Clinical Social Work     Living arrangements for the past 2 months: Single Family Home                                      Prior Living Arrangements/Services Living arrangements for the past 2 months: Single Family Home Lives with:: Self Patient language and need for interpreter reviewed:: Yes Do you feel safe going back to the place where you live?: Yes      Need for Family Participation in Patient Care: No (Comment) Care giver support system in place?: No (comment) Current home services: DME Criminal Activity/Legal Involvement Pertinent to Current Situation/Hospitalization: No - Comment as needed  Activities of Daily Living Home Assistive Devices/Equipment: Eyeglasses ADL Screening (condition at time of admission) Patient's cognitive ability adequate to safely complete daily activities?: No Is the  patient deaf or have difficulty hearing?: No Does the patient have difficulty seeing, even when wearing glasses/contacts?: No Does the patient have difficulty concentrating, remembering, or making decisions?: Yes Patient able to express need for assistance with ADLs?: Yes Does the patient have difficulty dressing or bathing?: No Independently performs ADLs?: Yes (appropriate for developmental age) Does the patient have difficulty walking or climbing stairs?: No Weakness of Legs: Both Weakness of Arms/Hands: None  Permission Sought/Granted Permission sought to share information with : Facility Art therapist granted to share information with : Yes, Verbal Permission Granted     Permission granted to share info w AGENCY: [facility]        Emotional Assessment Appearance:: Appears stated age Attitude/Demeanor/Rapport: Engaged Affect (typically observed): Pleasant Orientation: : Oriented to Self, Oriented to Place, Oriented to  Time, Oriented to Situation Alcohol / Substance Use: Not Applicable Psych Involvement: No (comment)  Admission diagnosis:  TIA (transient ischemic attack) [G45.9] Patient Active Problem List   Diagnosis Date Noted   TIA (transient ischemic attack) 01/03/2021   Slurred speech 01/16/2020   CKD (chronic kidney disease) stage 4, GFR 15-29 ml/min ([place]) 01/16/2020   Uncontrolled type 2 diabetes mellitus with hyperglycemia ([place]) 01/16/2020   CVA (cerebral vascular accident) ([place]) 12/05/2019   Acute CVA (cerebrovascular accident) ([place]) 06/21/2019   History of renal cell carcinoma 06/21/2019   Left ventricular hypertrophy 11/26/2018   CKD stage G3b/A3, GFR 30-44 and albumin creatinine ratio >300 mg/g ([place]) 11/26/2018  Change in stool 08/03/2018   Choledocholithiasis with obstruction 02/07/2017   AKI (acute kidney injury) ([place]) 02/07/2017   Acute kidney injury superimposed on CKD ([place]) 06/23/2011   Hypoglycemia associated with  diabetes ([place]) 06/23/2011   Type 2 diabetes mellitus with hyperlipidemia ([place]) 06/23/2011   Essential hypertension 06/23/2011   Hyperlipidemia 06/23/2011   Normocytic anemia 06/23/2011   UTI (lower urinary tract infection) 06/23/2011   PCP:  [name], MD Pharmacy:   [facility] [address], [place] [address] [place] [address] [place] [address] Phone: [phone] Fax: [place] [address], [place] [address] Phone: [phone] Fax: [phone]     Social Determinants of Health (SDOH) Interventions    Readmission Risk Interventions Readmission Risk Prevention Plan 06/22/2019  Transportation Screening Complete  PCP or Specialist Appt within 5-7 Days Complete  Home Care Screening Complete  Medication Review (RN CM) Referral to Pharmacy  Some recent data might be hidden     Final next level of care: OP Rehab Barriers to Discharge: Barriers Resolved   Patient Goals and CMS Choice Patient states their goals for this hospitalization and ongoing recovery are:: go home CMS Medicare.gov Compare Post Acute Care list provided to:: Patient Choice offered to / list presented to : Patient  Discharge Placement                       Discharge Plan and Services In-house Referral: Clinical Social Work                                   Social Determinants of Health ([place]) Interventions     Readmission Risk Interventions Readmission Risk Prevention Plan 06/22/2019  Transportation Screening Complete  PCP or Specialist Appt within 5-7 Days Complete  Home Care Screening Complete  Medication Review (RN CM) Referral to Pharmacy  Some recent data might be hidden

## 2021-01-08 ENCOUNTER — Encounter: Payer: Self-pay | Admitting: Neurology

## 2021-01-08 ENCOUNTER — Ambulatory Visit: Payer: Medicare PPO | Admitting: Neurology

## 2021-01-08 VITALS — BP 148/71 | HR 66 | Ht 61.0 in | Wt 187.0 lb

## 2021-01-08 DIAGNOSIS — Z8673 Personal history of transient ischemic attack (TIA), and cerebral infarction without residual deficits: Secondary | ICD-10-CM | POA: Diagnosis not present

## 2021-01-08 DIAGNOSIS — G459 Transient cerebral ischemic attack, unspecified: Secondary | ICD-10-CM | POA: Diagnosis not present

## 2021-01-08 DIAGNOSIS — I10 Essential (primary) hypertension: Secondary | ICD-10-CM | POA: Diagnosis not present

## 2021-01-08 DIAGNOSIS — Z794 Long term (current) use of insulin: Secondary | ICD-10-CM | POA: Diagnosis not present

## 2021-01-08 DIAGNOSIS — N184 Chronic kidney disease, stage 4 (severe): Secondary | ICD-10-CM | POA: Diagnosis not present

## 2021-01-08 DIAGNOSIS — J302 Other seasonal allergic rhinitis: Secondary | ICD-10-CM | POA: Diagnosis not present

## 2021-01-08 DIAGNOSIS — R29810 Facial weakness: Secondary | ICD-10-CM

## 2021-01-08 DIAGNOSIS — I6529 Occlusion and stenosis of unspecified carotid artery: Secondary | ICD-10-CM

## 2021-01-08 DIAGNOSIS — E1122 Type 2 diabetes mellitus with diabetic chronic kidney disease: Secondary | ICD-10-CM | POA: Diagnosis not present

## 2021-01-08 DIAGNOSIS — E7800 Pure hypercholesterolemia, unspecified: Secondary | ICD-10-CM | POA: Diagnosis not present

## 2021-01-08 NOTE — Patient Instructions (Signed)
I had a long d/w patient and her daughter about her recent TIA, intracranial stenosis,e, risk for recurrent stroke/TIAs, personally independently reviewed imaging studies and stroke evaluation results and answered questions.Continue aspirin 81 mg daily and clopidogrel 75 mg daily  for secondary stroke prevention for 3 months followed by Plavix alone and maintain strict control of hypertension with blood pressure goal below 130/90, diabetes with hemoglobin A1c goal below 6.5% and lipids with LDL cholesterol goal below 70 mg/dL. I also advised the patient to eat a healthy diet with plenty of whole grains, cereals, fruits and vegetables, exercise regularly and maintain ideal body weight.  She was advised to use a cane at all times and we discussed fall safety precautions.  Followup in the future with me in 6 months or call earlier if necessary. Fall Prevention in the Home, Adult Falls can cause injuries and can happen to people of all ages. There are many things you can do to make your home safe and to help prevent falls. Ask for help when making these changes. What actions can I take to prevent falls? General Instructions Use good lighting in all rooms. Replace any light bulbs that burn out. Turn on the lights in dark areas. Use night-lights. Keep items that you use often in easy-to-reach places. Lower the shelves around your home if needed. Set up your furniture so you have a clear path. Avoid moving your furniture around. Do not have throw rugs or other things on the floor that can make you trip. Avoid walking on wet floors. If any of your floors are uneven, fix them. Add color or contrast paint or tape to clearly mark and help you see: Grab bars or handrails. First and last steps of staircases. Where the edge of each step is. If you use a stepladder: Make sure that it is fully opened. Do not climb a closed stepladder. Make sure the sides of the stepladder are locked in place. Ask someone to hold  the stepladder while you use it. Know where your pets are when moving through your home. What can I do in the bathroom?   Keep the floor dry. Clean up any water on the floor right away. Remove soap buildup in the tub or shower. Use nonskid mats or decals on the floor of the tub or shower. Attach bath mats securely with double-sided, nonslip rug tape. If you need to sit down in the shower, use a plastic, nonslip stool. Install grab bars by the toilet and in the tub and shower. Do not use towel bars as grab bars. What can I do in the bedroom? Make sure that you have a light by your bed that is easy to reach. Do not use any sheets or blankets for your bed that hang to the floor. Have a firm chair with side arms that you can use for support when you get dressed. What can I do in the kitchen? Clean up any spills right away. If you need to reach something above you, use a step stool with a grab bar. Keep electrical cords out of the way. Do not use floor polish or wax that makes floors slippery. What can I do with my stairs? Do not leave any items on the stairs. Make sure that you have a light switch at the top and the bottom of the stairs. Make sure that there are handrails on both sides of the stairs. Fix handrails that are broken or loose. Install nonslip stair treads on all your  stairs. Avoid having throw rugs at the top or bottom of the stairs. Choose a carpet that does not hide the edge of the steps on the stairs. Check carpeting to make sure that it is firmly attached to the stairs. Fix carpet that is loose or worn. What can I do on the outside of my home? Use bright outdoor lighting. Fix the edges of walkways and driveways and fix any cracks. Remove anything that might make you trip as you walk through a door, such as a raised step or threshold. Trim any bushes or trees on paths to your home. Check to see if handrails are loose or broken and that both sides of all steps have  handrails. Install guardrails along the edges of any raised decks and porches. Clear paths of anything that can make you trip, such as tools or rocks. Have leaves, snow, or ice cleared regularly. Use sand or salt on paths during winter. Clean up any spills in your garage right away. This includes grease or oil spills. What other actions can I take? Wear shoes that: Have a low heel. Do not wear high heels. Have rubber bottoms. Feel good on your feet and fit well. Are closed at the toe. Do not wear open-toe sandals. Use tools that help you move around if needed. These include: Canes. Walkers. Scooters. Crutches. Review your medicines with your doctor. Some medicines can make you feel dizzy. This can increase your chance of falling. Ask your doctor what else you can do to help prevent falls. Where to find more information Centers for Disease Control and Prevention, [employer]: http://www.wolf.info/ National Institute on Aging: http://kim-miller.com/ Contact a doctor if: You are afraid of falling at home. You feel weak, drowsy, or dizzy at home. You fall at home. Summary There are many simple things that you can do to make your home safe and to help prevent falls. Ways to make your home safe include removing things that can make you trip and installing grab bars in the bathroom. Ask for help when making these changes in your home. This information is not intended to replace advice given to you by your health care provider. Make sure you discuss any questions you have with your health care provider. Document Revised: 08/17/2019 Document Reviewed: 08/17/2019 Elsevier Patient Education  [place].

## 2021-01-08 NOTE — Progress Notes (Signed)
[facility] [address] [place]. [place] [address] [phone]       OFFICE FOLLOW-UP NOTE  [name] Date of Birth:  1950/07/05 Medical Record Number:  [id number]   HPI: Initial visit 02/21/2020:Ms. [name] is a 70 year old African-American lady seen today for initial office follow-up visit following hospital admission for stroke October 2021.  History is obtained from the patient, review of electronic medical records and I personally reviewed pertinent imaging films in PACS.  She has past medical history of diabetes, hypertension, gastroesophageal reflux disease, shingles, prior right thalamic stroke in May 2021 with mild residual left-sided numbness who presented on 12/05/2019 with sudden onset of slurred speech and left-sided weakness.  Symptoms fluctuated and she did not reach the hospital right away.  MRI scan showed patchy right parieto-occipital and right insular cortical and subcortical acute infarcts as well as multiple old bilateral basal ganglia, thalamic and pontine lacunar infarcts.  MRI of the brain showed severe distal right M1 and moderate to severe left anterior cerebral artery and PCAs posterior cerebral artery stenosis.  Carotid ultrasound showed no significant extracranial stenosis.  2D echo showed ejection fraction of 60 to 65% without cardiac source of embolism.  LDL cholesterol was 49 mg percent and hemoglobin A1c was 8.4.  She was started on dual antiplatelet therapy of aspirin Plavix for 3 months due to intracranial stenosis and discharge.  Patient subsequently was seen on 01/16/2020 for recurrent transient dysarthria by Dr. Merlene Laughter at [facility].  MRI scan of the brain at this time did not show an acute stroke.  Patient states which she is tolerating aspirin Plavix well without bruising or bleeding.  She was seen by me initially in May 2021 when she had a right thalamic lacunar stroke.  She enrolled to participate in the sleep smart study at  that time she was participating the sleep smart study and actually finished participation in November.  She complains of short-term memory difficulties and difficulty remembering recent information.  She however still quite independent and does most activities of daily living by herself.  She has had recent infection in the ear for which she was treated with steroids which has led to increasing her sugars and fastings sugar now ranges in the 200s.  She does have upcoming appointment next month to see endocrinologist Dr. Suzette Battiest.  She is able to ambulate with a 4 pronged cane and is careful and has not had any falls.  She is currently doing outpatient physical and occupational therapy.  She still she is still off balance but is learned to compensate with a cane. Update 07/11/2020: She returns for follow-up after last visit for an of months ago.  She is accompanied by daughter.  Patient has not had any recurrent TIA or stroke symptoms.  She is doing outpatient physical therapy which is helping her with her walking and balance.  She still occasionally stumbles and has only 1 minor fall.  She continues to have paresthesias in the left hand and leg which are intermittent and not bothersome.  Patient was advised to me to get memory panel labs done at last visit but for unclear reason has not been done.  EEG done on 03/06/2020 was normal.  She continues to have mild short-term memory difficulties with she has been participating in cognitively challenging activities like playing games on the computer that seems to help.  Patient admits she has not been very compliant with using her CPAP for his sleep apnea.  She has no new complaints. Update 01/08/2021 ; patient is seen for follow-up after last visit in our office on June 2022.  She was admitted to [facility] with a TIA on 01/03/2021.  She developed sudden onset of left facial droop and slurred speech noticed by the daughter.  The symptoms quickly resolved by the time  she reached the emergency room and 20 minutes or so.  CT scan of the head was unremarkable and MRI scan of the brain showed no acute abnormality.  MR angiogram of the brain and neck showed stable appearance of multifocal stenosis in the right vertebral artery as well as moderate distal right M1 middle cerebral artery and inferior division right M2 stenosis with moderate bilateral PCA stenosis as well.  Echocardiogram shows ejection fraction of 70 to 75% which severe left ventricular hypertrophy but no cardiac source of embolism.  LDL cholesterol was quite low at 60 mg percent but triglycerides were elevated at 253 mg percent.  Hemoglobin A1c was elevated at 10.4.  Patient states she is done well since then.  She has been on aspirin and Plavix following this episode prior to which she was only on Plavix.  She is tolerating them well without bruising or bleeding.  She states her diabetes control is not quite good but she plans to discuss this with primary physician.  Her blood pressure is usually under better control but today it is slightly elevated in office at 148/71.  She is tolerating Lipitor well without muscle aches and pains.  She does use a CPAP every night for sleep apnea without fail.  She continues to have some dizziness and gait imbalance but otherwise is at her baseline. ROS:   14 system review of systems is positive for imbalance, facial droop, slurred speech, memory loss, ear infection, dysarthria, sleep apnea and all other systems negative  PMH:  Past Medical History:  Diagnosis Date   Cancer of kidney ([place]) 05/14/2009   Diabetes mellitus    Endometrial mass    GERD (gastroesophageal reflux disease)    Hypercholesterolemia    Hypertension    Internal hemorrhoids    S/P colonoscopy 06/10/00 by Dr. 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    Employer: RETIRED  Tobacco Use   Smoking status: Never   Smokeless tobacco: Never  Vaping Use   Vaping Use: Never used  Substance and Sexual Activity   Alcohol use: No   Drug use: No   Sexual activity: Not on file  Other Topics Concern   Not on file  Social History Narrative   Lives with Husband and daughter   Right Handed   Drinks no caffeine daily   Social Determinants of Health   Financial Resource Strain: Not on file  Food Insecurity: Not on file  Transportation Needs: Not on file  Physical Activity: Not on file  Stress: Not on file  Social Connections: Not on file  Intimate Partner Violence: Not on file    Medications:   Current Outpatient Medications on File Prior to Visit  Medication Sig Dispense Refill   acetaminophen (TYLENOL) 500 MG tablet Take 500  mg by mouth every 6 (six) hours as needed for mild pain or headache.      albuterol (VENTOLIN HFA) 108 (90 Base) MCG/ACT inhaler Inhale 2 puffs into the lungs every 6 (six) hours as needed for wheezing or shortness of breath.     aspirin 81 MG chewable tablet Chew 1 tablet (81 mg total) by mouth daily. 30 tablet 2   carvedilol (COREG) 25 MG tablet Take 1 tablet (25 mg total) by mouth 2 (two) times daily. 60 tablet 3   Cholecalciferol (VITAMIN D-3) 25 MCG (1000 UT) CAPS Take 1,000 Units by mouth daily.      cloNIDine (CATAPRES) 0.1 MG tablet Take 0.1 mg by mouth See admin instructions. Take 1 and 1/2 tablet twice a day     clopidogrel (PLAVIX) 75 MG tablet Take 1 tablet by mouth once daily (Patient taking differently: Take 75 mg by mouth See admin instructions. Take 1/2 tablet in the morning and 1/2 tablet every evening) 90 tablet 2   Dulaglutide (TRULICITY) 4.5 ZD/6.3OV SOPN Inject 4.5 mg into the skin every 7 (seven) days.     furosemide (LASIX) 20 MG tablet Take  40 mg by mouth 2 (two) times daily.      gabapentin (NEURONTIN) 100 MG capsule Take 100 mg by mouth daily as needed (shoulder pain).     glucose 4 GM chewable tablet Chew 1 tablet by mouth once as needed for low blood sugar.     insulin aspart (NOVOLOG FLEXPEN) 100 UNIT/ML FlexPen Inject 4-12 Units into the skin 3 (three) times daily with meals. Per sliding scale - based on CBG     insulin glargine (LANTUS) 100 unit/mL SOPN Inject 14 Units into the skin 2 (two) times daily. 14 units am    6 units bedtime 15 mL 3   rosuvastatin (CRESTOR) 40 MG tablet Take 40 mg by mouth daily.     senna-docusate (SENOKOT-S) 8.6-50 MG tablet Take 1 tablet by mouth daily as needed for mild constipation.     telmisartan (MICARDIS) 20 MG tablet Take 20 mg by mouth daily.      vitamin B-12 (CYANOCOBALAMIN) 1000 MCG tablet Take 1,000 mcg by mouth daily.     No current facility-administered medications on file prior to visit.    Allergies:   Allergies  Allergen Reactions   Amlodipine Swelling   Tramadol Diarrhea and Nausea And Vomiting    Physical Exam General: well developed, well nourished elderly African-American lady, seated, in no evident distress Head: head normocephalic and atraumatic.  Neck: supple with no carotid or supraclavicular bruits Cardiovascular: regular rate and rhythm, no murmurs Musculoskeletal: no deformity Skin:  no rash/petichiae Vascular:  Normal pulses all extremities Vitals:   01/08/21 1332  BP: (!) 148/71  Pulse: 66   Neurologic Exam Mental Status: Awake and fully alert. Oriented to place and time. Recent and remote memory intact. Attention span, concentration and fund of knowledge appropriate. Mood and affect appropriate.  Diminished recall 1/3.  Able to name 8 animals which can walk on 4 legs.  Clock drawing 4/4. Cranial Nerves: Fundoscopic exam not done. Pupils equal, briskly reactive to light. Extraocular movements full without nystagmus. Visual fields full to confrontation.  Hearing intact. Facial sensation intact. Face, tongue, palate moves normally and symmetrically.  Motor: Normal bulk and tone. Normal strength in all tested extremity muscles. Sensory.: intact to touch ,pinprick .position and vibratory sensation.  But mild subjective left upper and lower extremity paresthesias. Coordination: Rapid alternating movements normal in all  extremities. Finger-to-nose and heel-to-shin performed accurately bilaterally. Gait and Station: Arises from chair without difficulty. Stance is normal. Gait demonstrates slight broad-based and imbalance and uses a 4 pronged cane. Reflexes: 1+ and symmetric. Toes downgoing.   NIHSS  0 Modified Rankin 1   ASSESSMENT: 70 year old African-American lady with episode of TIA in December 2021 as well as right MCA branch infarct in October 2021 due to intracranial stenosis.  Vascular risk factors of hypertension, hyperlipidemia, diabetes and intracranial stenosis.  She has memory difficulties due to mild cognitive impairment. which appear stable.     PLAN: I had a long d/w patient and her daughter about her recent TIA, intracranial stenosis,e, risk for recurrent stroke/TIAs, personally independently reviewed imaging studies and stroke evaluation results and answered questions.Continue aspirin 81 mg daily and clopidogrel 75 mg daily  for secondary stroke prevention for 3 months followed by Plavix alone and maintain strict control of hypertension with blood pressure goal below 130/90, diabetes with hemoglobin A1c goal below 6.5% and lipids with LDL cholesterol goal below 70 mg/dL. I also advised the patient to eat a healthy diet with plenty of whole grains, cereals, fruits and vegetables, exercise regularly and maintain ideal body weight.  She was advised to use a cane at all times and we discussed fall safety precautions.  Followup in the future with me in 6 months or call earlier if necessary. Greater than 50% of time during this prolonged 40  minute visit was spent on counseling,explanation of diagnosis TIA, memory loss and intracranial stenosis, planning of further management, discussion with patient and family and coordination of care Antony Contras, MD Medical Director [facility] Pager: [phone] 01/08/2021 2:02 PM Note: This document was prepared with digital dictation and possible smart phrase technology. Any transcriptional errors that result from this process are unintentional

## 2021-01-10 ENCOUNTER — Ambulatory Visit: Payer: Medicare PPO | Admitting: Neurology

## 2021-01-14 DIAGNOSIS — E1165 Type 2 diabetes mellitus with hyperglycemia: Secondary | ICD-10-CM | POA: Diagnosis not present

## 2021-01-31 ENCOUNTER — Emergency Department (HOSPITAL_COMMUNITY)
Admission: EM | Admit: 2021-01-31 | Discharge: 2021-01-31 | Disposition: A | Discharge status: Home / Self Care | Payer: Medicare PPO | Attending: Emergency Medicine | Admitting: Emergency Medicine

## 2021-01-31 ENCOUNTER — Other Ambulatory Visit: Payer: Self-pay

## 2021-01-31 ENCOUNTER — Encounter (HOSPITAL_COMMUNITY): Payer: Self-pay

## 2021-01-31 ENCOUNTER — Emergency Department (HOSPITAL_COMMUNITY): Payer: Medicare PPO

## 2021-01-31 DIAGNOSIS — Z7902 Long term (current) use of antithrombotics/antiplatelets: Secondary | ICD-10-CM | POA: Insufficient documentation

## 2021-01-31 DIAGNOSIS — R29810 Facial weakness: Secondary | ICD-10-CM | POA: Diagnosis not present

## 2021-01-31 DIAGNOSIS — Z7982 Long term (current) use of aspirin: Secondary | ICD-10-CM | POA: Diagnosis not present

## 2021-01-31 DIAGNOSIS — G459 Transient cerebral ischemic attack, unspecified: Secondary | ICD-10-CM | POA: Diagnosis not present

## 2021-01-31 DIAGNOSIS — R4781 Slurred speech: Secondary | ICD-10-CM | POA: Diagnosis not present

## 2021-01-31 DIAGNOSIS — D649 Anemia, unspecified: Secondary | ICD-10-CM | POA: Diagnosis not present

## 2021-01-31 DIAGNOSIS — R220 Localized swelling, mass and lump, head: Secondary | ICD-10-CM | POA: Insufficient documentation

## 2021-01-31 DIAGNOSIS — R29898 Other symptoms and signs involving the musculoskeletal system: Secondary | ICD-10-CM | POA: Diagnosis not present

## 2021-01-31 DIAGNOSIS — R42 Dizziness and giddiness: Secondary | ICD-10-CM | POA: Diagnosis not present

## 2021-01-31 DIAGNOSIS — R531 Weakness: Secondary | ICD-10-CM | POA: Insufficient documentation

## 2021-01-31 LAB — COMPREHENSIVE METABOLIC PANEL
ALT: 30 U/L (ref 0–44)
AST: 20 U/L (ref 15–41)
Albumin: 3.5 g/dL (ref 3.5–5.0)
Alkaline Phosphatase: 81 U/L (ref 38–126)
Anion gap: 9 (ref 5–15)
BUN: 49 mg/dL — ABNORMAL HIGH (ref 8–23)
CO2: 22 mmol/L (ref 22–32)
Calcium: 9.5 mg/dL (ref 8.9–10.3)
Chloride: 106 mmol/L (ref 98–111)
Creatinine, Ser: 2.82 mg/dL — ABNORMAL HIGH (ref 0.44–1.00)
GFR, Estimated: 17 mL/min — ABNORMAL LOW (ref >60)
Glucose, Bld: 129 mg/dL — ABNORMAL HIGH (ref 70–99)
Potassium: 4.2 mmol/L (ref 3.5–5.1)
Sodium: 137 mmol/L (ref 135–145)
Total Bilirubin: 0.5 mg/dL (ref 0.3–1.2)
Total Protein: 7 g/dL (ref 6.5–8.1)

## 2021-01-31 LAB — CBC WITH DIFFERENTIAL/PLATELET
Abs Immature Granulocytes: 0.05 10*3/uL (ref 0.00–0.07)
Basophils Absolute: 0 10*3/uL (ref 0.0–0.1)
Basophils Relative: 1 %
Eosinophils Absolute: 0.3 10*3/uL (ref 0.0–0.5)
Eosinophils Relative: 4 %
HCT: 32.4 % — ABNORMAL LOW (ref 36.0–46.0)
Hemoglobin: 10.4 g/dL — ABNORMAL LOW (ref 12.0–15.0)
Immature Granulocytes: 1 %
Lymphocytes Relative: 24 %
Lymphs Abs: 1.6 10*3/uL (ref 0.7–4.0)
MCH: 28.6 pg (ref 26.0–34.0)
MCHC: 32.1 g/dL (ref 30.0–36.0)
MCV: 89 fL (ref 80.0–100.0)
Monocytes Absolute: 0.8 10*3/uL (ref 0.1–1.0)
Monocytes Relative: 11 %
Neutro Abs: 3.9 10*3/uL (ref 1.7–7.7)
Neutrophils Relative %: 59 %
Platelets: 235 10*3/uL (ref 150–400)
RBC: 3.64 MIL/uL — ABNORMAL LOW (ref 3.87–5.11)
RDW: 13.5 % (ref 11.5–15.5)
WBC: 6.6 10*3/uL (ref 4.0–10.5)
nRBC: 0 % (ref 0.0–0.2)

## 2021-01-31 NOTE — ED Notes (Signed)
Patient transported to CT 

## 2021-01-31 NOTE — ED Notes (Signed)
Pt placed on purewick 

## 2021-01-31 NOTE — Discharge Instructions (Signed)
Follow-up with your family doctor next week for recheck.  Return if any problems

## 2021-01-31 NOTE — ED Triage Notes (Signed)
Patient brought in via ems from home. Family called ems for left sided weakness and slurring of speech. When ems arrived patient had improved symptoms. Hx of 3 tias and 2 strokes. No code stroke called per MD

## 2021-01-31 NOTE — ED Provider Notes (Signed)
[facility] Provider Note   CSN: [id number] Arrival date & time: 01/31/21  1515     History  Chief Complaint  Patient presents with   Weakness    Briana Garrison is a 71 y.o. female.  Patient was brought in for facial drooping and weakness.  Patient states she has been having weakness for a number days.  And she does not have facial droop just mild swelling to the  left side of her face.  Patient has a history of CVA  The history is provided by the patient and medical records. No language interpreter was used.  Weakness Severity:  Mild Onset quality:  Gradual Timing:  Intermittent Progression:  Waxing and waning Chronicity:  Recurrent Context: not alcohol use   Relieved by:  Nothing Worsened by:  Nothing Ineffective treatments:  None tried Associated symptoms: no abdominal pain, no chest pain, no cough, no diarrhea, no frequency, no headaches and no seizures       Home Medications Prior to Admission medications   Medication Sig Start Date End Date Taking? Authorizing Provider  acetaminophen (TYLENOL) 500 MG tablet Take 500 mg by mouth every 6 (six) hours as needed for mild pain or headache.    Yes [provider]  albuterol (VENTOLIN HFA) 108 (90 Base) MCG/ACT inhaler Inhale 2 puffs into the lungs every 6 (six) hours as needed for wheezing or shortness of breath.   Yes [provider]  carvedilol (COREG) 25 MG tablet Take 1 tablet (25 mg total) by mouth 2 (two) times daily. 01/04/21  Yes Barton Dubois, MD  Cholecalciferol (VITAMIN D-3) 25 MCG (1000 UT) CAPS Take 1,000 Units by mouth daily.    Yes [provider]  cloNIDine (CATAPRES) 0.1 MG tablet Take 0.1 mg by mouth See admin instructions. Take 1 and 1/2 tablet twice a day   Yes [provider]  clopidogrel (PLAVIX) 75 MG tablet Take 1 tablet by mouth once daily Patient taking differently: Take 75 mg by mouth See admin instructions. Take 1/2 tablet in the morning and  1/2 tablet every evening 10/11/20  Yes Garvin Fila, MD  fluticasone [facility]) 50 MCG/ACT nasal spray Place 1 spray into both nostrils daily as needed for allergies. 01/08/21  Yes [provider]  gabapentin (NEURONTIN) 100 MG capsule Take 100 mg by mouth daily as needed (shoulder pain).   Yes [provider]  glucose 4 GM chewable tablet Chew 1 tablet by mouth once as needed for low blood sugar.   Yes [provider]  insulin aspart (NOVOLOG FLEXPEN) 100 UNIT/ML FlexPen Inject 4-12 Units into the skin 3 (three) times daily with meals. Per sliding scale - based on CBG   Yes [provider]  insulin glargine (LANTUS) 100 unit/mL SOPN Inject 14 Units into the skin 2 (two) times daily. 14 units am    6 units bedtime 01/04/21  Yes Barton Dubois, MD  rosuvastatin (CRESTOR) 40 MG tablet Take 40 mg by mouth daily. 08/30/19  Yes [provider]  Semaglutide,0.25 or 0.5MG /DOS, (OZEMPIC, 0.25 OR 0.5 MG/DOSE,) 2 MG/1.5ML SOPN Inject 0.5 mg into the vein once a week. wednesday   Yes [provider]  senna-docusate (SENOKOT-S) 8.6-50 MG tablet Take 1 tablet by mouth daily as needed for mild constipation.   Yes [provider]  telmisartan (MICARDIS) 20 MG tablet Take 20 mg by mouth daily.  08/30/19  Yes [provider]  vitamin B-12 (CYANOCOBALAMIN) 1000 MCG tablet Take 1,000 mcg  by mouth daily.   Yes [provider]  aspirin 81 MG chewable tablet Chew 1 tablet (81 mg total) by mouth daily. Patient not taking: Reported on 01/31/2021 01/05/21   Barton Dubois, MD  Dulaglutide (TRULICITY) 4.5 TM/5.4YT SOPN Inject 4.5 mg into the skin every 7 (seven) days. Patient not taking: Reported on 01/31/2021    [provider]      Allergies    Amlodipine and Tramadol    Review of Systems   Review of Systems  Constitutional:  Negative for appetite change and fatigue.  HENT:  Negative for congestion, ear discharge and sinus pressure.    Eyes:  Negative for discharge.  Respiratory:  Negative for cough.   Cardiovascular:  Negative for chest pain.  Gastrointestinal:  Negative for abdominal pain and diarrhea.  Genitourinary:  Negative for frequency and hematuria.  Musculoskeletal:  Negative for back pain.  Skin:  Negative for rash.  Neurological:  Positive for weakness. Negative for seizures and headaches.  Psychiatric/Behavioral:  Negative for hallucinations.    Physical Exam Updated Vital Signs BP (!) 181/76    Pulse 65    Temp 97.8 F (36.6 C) (Oral)    Resp (!) 21    Ht 5\' 1"  (1.549 m)    Wt 83.9 kg    SpO2 100%    BMI 34.96 kg/m  Physical Exam Vitals and nursing note reviewed.  Constitutional:      Appearance: She is well-developed.  HENT:     Head: Normocephalic.     Comments: Minimal left facial swelling    Nose: Nose normal.  Eyes:     General: No scleral icterus.    Conjunctiva/sclera: Conjunctivae normal.  Neck:     Thyroid: No thyromegaly.  Cardiovascular:     Rate and Rhythm: Normal rate and regular rhythm.     Heart sounds: No murmur heard.   No friction rub. No gallop.  Pulmonary:     Breath sounds: No stridor. No wheezing or rales.  Chest:     Chest wall: No tenderness.  Abdominal:     General: There is no distension.     Tenderness: There is no abdominal tenderness. There is no rebound.  Musculoskeletal:        General: Normal range of motion.     Cervical back: Neck supple.  Lymphadenopathy:     Cervical: No cervical adenopathy.  Skin:    Findings: No erythema or rash.  Neurological:     Mental Status: She is alert and oriented to person, place, and time.     Motor: No abnormal muscle tone.     Coordination: Coordination normal.  Psychiatric:        Behavior: Behavior normal.    ED Results / Procedures / Treatments   Labs (all labs ordered are listed, but only abnormal results are displayed) Labs Reviewed  CBC WITH DIFFERENTIAL/PLATELET - Abnormal; Notable for the following  components:      Result Value   RBC 3.64 (*)    Hemoglobin 10.4 (*)    HCT 32.4 (*)    All other components within normal limits  COMPREHENSIVE METABOLIC PANEL - Abnormal; Notable for the following components:   Glucose, Bld 129 (*)    BUN 49 (*)    Creatinine, Ser 2.82 (*)    GFR, Estimated 17 (*)    All other components within normal limits    EKG EKG Interpretation  Date/Time:  Thursday January 31 2021 15:23:38 EST Ventricular Rate:  68 PR Interval:  169 QRS Duration: 83 QT Interval:  403 QTC Calculation: 429 R Axis:   55 Text Interpretation: Sinus rhythm Low voltage, precordial leads Nonspecific T abnormalities, diffuse leads Confirmed by Milton Ferguson [phone]) on 01/31/2021 7:29:33 PM  Radiology CT Head Wo Contrast  Result Date: 01/31/2021 CLINICAL DATA:  Dizziness, left-sided weakness and slurred speech EXAM: CT HEAD WITHOUT CONTRAST TECHNIQUE: Contiguous axial images were obtained from the base of the skull through the vertex without intravenous contrast. COMPARISON:  01/03/2021 FINDINGS: Brain: No evidence of acute infarction, hemorrhage, cerebral edema, mass, mass effect, or midline shift. No hydrocephalus or extra-axial fluid collection. Hypodensity in the right parieto-occipital region, which correlates with a narrowed infarction on a prior MRI. Periventricular white matter changes, likely the sequela of chronic small vessel ischemic disease. Lacunar infarcts in the bilateral thalami. Vascular: No hyperdense vessel. Atherosclerotic calcifications in the intracranial carotid and vertebral arteries. Skull: Normal. Negative for fracture or focal lesion. Sinuses/Orbits: Chronic sinus disease, most pronounced in the right maxillary sinus, with additional opacification of the right-greater-than-left ethmoid air cells, right frontal sinus and left maxillary sinus. Other: The mastoid air cells are well aerated. IMPRESSION: IMPRESSION No acute intracranial process. Electronically Signed    By: Merilyn Baba M.D.   On: 01/31/2021 17:32    Procedures Procedures    Medications Ordered in ED Medications - No data to display  ED Course/ Medical Decision Making/ A&P                           Medical Decision Making  Patient with mild weakness.  Labs unremarkable except for mild anemia.  She will follow-up with PCP This patient presents to the ED for concern of weakness and facial droop, this involves an extensive number of treatment options, and is a complaint that carries with it a high risk of complications and morbidity.  The differential diagnosis includes stroke, TIA, anemia   Co morbidities that complicate the patient evaluation  Kidney disease and history of strokes and TIAs   Additional history obtained:  Additional history obtained from daughter External records from outside source obtained and reviewed including old records   Lab Tests:  I Ordered, and personally interpreted labs.  The pertinent results include: Mild anemia and kidney disease   Imaging Studies ordered:  I ordered imaging studies including CT head I independently visualized and interpreted imaging which showed no acute stroke stroke seen I agree with the radiologist interpretation   Cardiac Monitoring:  The patient was maintained on a cardiac monitor.  I personally viewed and interpreted the cardiac monitored which showed an underlying rhythm of: Normal sinus rhythm   Medicines ordered and prescription drug management:  No medicines ordered Reevaluation of the patient after these medicines showed that the patient improved I have reviewed the patients home medicines and have made adjustments as needed   Test Considered:  MRI of the brain   Critical Interventions:  None   Consultations Obtained:  None  Problem List / ED Course:  General weakness, initially facial droop but family stated she really did not have a facial droop   Reevaluation:  After the  interventions noted above, I reevaluated the patient and found that they have :improved   Social Determinants of Health:  None  Dispostion:  After consideration of the diagnostic results and the patients response to treatment, I feel that the patent would benefit from patient will follow up with PCP.  Final Clinical Impression(s) / ED Diagnoses Final diagnoses:  Weakness    Rx / DC Orders ED Discharge Orders     None         Milton Ferguson, MD 02/01/21 1105

## 2021-02-05 DIAGNOSIS — E1122 Type 2 diabetes mellitus with diabetic chronic kidney disease: Secondary | ICD-10-CM | POA: Diagnosis not present

## 2021-02-05 DIAGNOSIS — I10 Essential (primary) hypertension: Secondary | ICD-10-CM | POA: Diagnosis not present

## 2021-02-05 DIAGNOSIS — Z794 Long term (current) use of insulin: Secondary | ICD-10-CM | POA: Diagnosis not present

## 2021-02-05 DIAGNOSIS — E7800 Pure hypercholesterolemia, unspecified: Secondary | ICD-10-CM | POA: Diagnosis not present

## 2021-02-05 DIAGNOSIS — R5383 Other fatigue: Secondary | ICD-10-CM | POA: Diagnosis not present

## 2021-02-07 DIAGNOSIS — E1140 Type 2 diabetes mellitus with diabetic neuropathy, unspecified: Secondary | ICD-10-CM | POA: Diagnosis not present

## 2021-02-07 DIAGNOSIS — R0981 Nasal congestion: Secondary | ICD-10-CM | POA: Diagnosis not present

## 2021-02-07 DIAGNOSIS — N184 Chronic kidney disease, stage 4 (severe): Secondary | ICD-10-CM | POA: Diagnosis not present

## 2021-02-13 DIAGNOSIS — E1165 Type 2 diabetes mellitus with hyperglycemia: Secondary | ICD-10-CM | POA: Diagnosis not present

## 2021-02-20 DIAGNOSIS — I10 Essential (primary) hypertension: Secondary | ICD-10-CM | POA: Diagnosis not present

## 2021-02-20 DIAGNOSIS — Z8673 Personal history of transient ischemic attack (TIA), and cerebral infarction without residual deficits: Secondary | ICD-10-CM | POA: Diagnosis not present

## 2021-02-20 DIAGNOSIS — Z794 Long term (current) use of insulin: Secondary | ICD-10-CM | POA: Diagnosis not present

## 2021-02-20 DIAGNOSIS — E7800 Pure hypercholesterolemia, unspecified: Secondary | ICD-10-CM | POA: Diagnosis not present

## 2021-02-20 DIAGNOSIS — E1122 Type 2 diabetes mellitus with diabetic chronic kidney disease: Secondary | ICD-10-CM | POA: Diagnosis not present

## 2021-03-14 DIAGNOSIS — I10 Essential (primary) hypertension: Secondary | ICD-10-CM | POA: Diagnosis not present

## 2021-03-14 DIAGNOSIS — I639 Cerebral infarction, unspecified: Secondary | ICD-10-CM | POA: Diagnosis not present

## 2021-03-14 DIAGNOSIS — Z794 Long term (current) use of insulin: Secondary | ICD-10-CM | POA: Diagnosis not present

## 2021-03-14 DIAGNOSIS — E1129 Type 2 diabetes mellitus with other diabetic kidney complication: Secondary | ICD-10-CM | POA: Diagnosis not present

## 2021-03-14 DIAGNOSIS — R809 Proteinuria, unspecified: Secondary | ICD-10-CM | POA: Insufficient documentation

## 2021-03-14 DIAGNOSIS — N184 Chronic kidney disease, stage 4 (severe): Secondary | ICD-10-CM | POA: Diagnosis not present

## 2021-03-18 ENCOUNTER — Encounter (HOSPITAL_COMMUNITY): Payer: Self-pay

## 2021-03-18 ENCOUNTER — Emergency Department (HOSPITAL_COMMUNITY): Payer: Medicare PPO

## 2021-03-18 ENCOUNTER — Other Ambulatory Visit (HOSPITAL_COMMUNITY): Payer: Self-pay | Admitting: Radiology

## 2021-03-18 ENCOUNTER — Inpatient Hospital Stay (HOSPITAL_COMMUNITY)
Admission: EM | Admit: 2021-03-18 | Discharge: 2021-03-21 | DRG: 065 | Disposition: A | Discharge status: Home Health | Payer: Medicare PPO | Attending: Internal Medicine | Admitting: Internal Medicine

## 2021-03-18 ENCOUNTER — Other Ambulatory Visit: Payer: Self-pay

## 2021-03-18 DIAGNOSIS — Z79899 Other long term (current) drug therapy: Secondary | ICD-10-CM

## 2021-03-18 DIAGNOSIS — R531 Weakness: Secondary | ICD-10-CM | POA: Diagnosis not present

## 2021-03-18 DIAGNOSIS — N189 Chronic kidney disease, unspecified: Secondary | ICD-10-CM | POA: Diagnosis not present

## 2021-03-18 DIAGNOSIS — I129 Hypertensive chronic kidney disease with stage 1 through stage 4 chronic kidney disease, or unspecified chronic kidney disease: Secondary | ICD-10-CM | POA: Diagnosis present

## 2021-03-18 DIAGNOSIS — Z7982 Long term (current) use of aspirin: Secondary | ICD-10-CM

## 2021-03-18 DIAGNOSIS — I6381 Other cerebral infarction due to occlusion or stenosis of small artery: Secondary | ICD-10-CM | POA: Diagnosis not present

## 2021-03-18 DIAGNOSIS — E860 Dehydration: Secondary | ICD-10-CM | POA: Diagnosis not present

## 2021-03-18 DIAGNOSIS — Z85528 Personal history of other malignant neoplasm of kidney: Secondary | ICD-10-CM | POA: Diagnosis not present

## 2021-03-18 DIAGNOSIS — G8194 Hemiplegia, unspecified affecting left nondominant side: Secondary | ICD-10-CM | POA: Diagnosis present

## 2021-03-18 DIAGNOSIS — Z20822 Contact with and (suspected) exposure to covid-19: Secondary | ICD-10-CM | POA: Diagnosis not present

## 2021-03-18 DIAGNOSIS — R29707 NIHSS score 7: Secondary | ICD-10-CM | POA: Diagnosis present

## 2021-03-18 DIAGNOSIS — Z885 Allergy status to narcotic agent status: Secondary | ICD-10-CM

## 2021-03-18 DIAGNOSIS — Z6835 Body mass index (BMI) 35.0-35.9, adult: Secondary | ICD-10-CM | POA: Diagnosis not present

## 2021-03-18 DIAGNOSIS — G8104 Flaccid hemiplegia affecting left nondominant side: Secondary | ICD-10-CM | POA: Diagnosis not present

## 2021-03-18 DIAGNOSIS — Z886 Allergy status to analgesic agent status: Secondary | ICD-10-CM | POA: Diagnosis not present

## 2021-03-18 DIAGNOSIS — G459 Transient cerebral ischemic attack, unspecified: Secondary | ICD-10-CM | POA: Diagnosis present

## 2021-03-18 DIAGNOSIS — I6601 Occlusion and stenosis of right middle cerebral artery: Secondary | ICD-10-CM | POA: Diagnosis not present

## 2021-03-18 DIAGNOSIS — E785 Hyperlipidemia, unspecified: Secondary | ICD-10-CM | POA: Diagnosis not present

## 2021-03-18 DIAGNOSIS — Z823 Family history of stroke: Secondary | ICD-10-CM | POA: Diagnosis not present

## 2021-03-18 DIAGNOSIS — D631 Anemia in chronic kidney disease: Secondary | ICD-10-CM | POA: Diagnosis not present

## 2021-03-18 DIAGNOSIS — N179 Acute kidney failure, unspecified: Secondary | ICD-10-CM | POA: Diagnosis not present

## 2021-03-18 DIAGNOSIS — I672 Cerebral atherosclerosis: Secondary | ICD-10-CM | POA: Diagnosis present

## 2021-03-18 DIAGNOSIS — I959 Hypotension, unspecified: Secondary | ICD-10-CM | POA: Diagnosis not present

## 2021-03-18 DIAGNOSIS — N184 Chronic kidney disease, stage 4 (severe): Secondary | ICD-10-CM | POA: Diagnosis present

## 2021-03-18 DIAGNOSIS — I6609 Occlusion and stenosis of unspecified middle cerebral artery: Secondary | ICD-10-CM | POA: Diagnosis present

## 2021-03-18 DIAGNOSIS — Z8041 Family history of malignant neoplasm of ovary: Secondary | ICD-10-CM

## 2021-03-18 DIAGNOSIS — R29810 Facial weakness: Secondary | ICD-10-CM | POA: Diagnosis present

## 2021-03-18 DIAGNOSIS — I517 Cardiomegaly: Secondary | ICD-10-CM | POA: Diagnosis present

## 2021-03-18 DIAGNOSIS — I639 Cerebral infarction, unspecified: Secondary | ICD-10-CM

## 2021-03-18 DIAGNOSIS — Z794 Long term (current) use of insulin: Secondary | ICD-10-CM

## 2021-03-18 DIAGNOSIS — E1169 Type 2 diabetes mellitus with other specified complication: Secondary | ICD-10-CM | POA: Diagnosis present

## 2021-03-18 DIAGNOSIS — I10 Essential (primary) hypertension: Secondary | ICD-10-CM

## 2021-03-18 DIAGNOSIS — Z7985 Long-term (current) use of injectable non-insulin antidiabetic drugs: Secondary | ICD-10-CM | POA: Diagnosis not present

## 2021-03-18 DIAGNOSIS — E11649 Type 2 diabetes mellitus with hypoglycemia without coma: Secondary | ICD-10-CM | POA: Diagnosis not present

## 2021-03-18 DIAGNOSIS — E7800 Pure hypercholesterolemia, unspecified: Secondary | ICD-10-CM | POA: Diagnosis present

## 2021-03-18 DIAGNOSIS — E1165 Type 2 diabetes mellitus with hyperglycemia: Secondary | ICD-10-CM | POA: Diagnosis not present

## 2021-03-18 DIAGNOSIS — E669 Obesity, unspecified: Secondary | ICD-10-CM | POA: Diagnosis present

## 2021-03-18 DIAGNOSIS — R739 Hyperglycemia, unspecified: Secondary | ICD-10-CM | POA: Diagnosis present

## 2021-03-18 DIAGNOSIS — I69314 Frontal lobe and executive function deficit following cerebral infarction: Secondary | ICD-10-CM | POA: Diagnosis not present

## 2021-03-18 DIAGNOSIS — I63511 Cerebral infarction due to unspecified occlusion or stenosis of right middle cerebral artery: Secondary | ICD-10-CM | POA: Diagnosis not present

## 2021-03-18 DIAGNOSIS — Z7902 Long term (current) use of antithrombotics/antiplatelets: Secondary | ICD-10-CM | POA: Diagnosis not present

## 2021-03-18 LAB — I-STAT CHEM 8, ED
BUN: 68 mg/dL — ABNORMAL HIGH (ref 8–23)
Calcium, Ion: 1.24 mmol/L (ref 1.15–1.40)
Chloride: 103 mmol/L (ref 98–111)
Creatinine, Ser: 3.6 mg/dL — ABNORMAL HIGH (ref 0.44–1.00)
Glucose, Bld: 217 mg/dL — ABNORMAL HIGH (ref 70–99)
HCT: 36 % (ref 36.0–46.0)
Hemoglobin: 12.2 g/dL (ref 12.0–15.0)
Potassium: 4.4 mmol/L (ref 3.5–5.1)
Sodium: 135 mmol/L (ref 135–145)
TCO2: 22 mmol/L (ref 22–32)

## 2021-03-18 LAB — CBC
HCT: 34.8 % — ABNORMAL LOW (ref 36.0–46.0)
Hemoglobin: 11 g/dL — ABNORMAL LOW (ref 12.0–15.0)
MCH: 29.1 pg (ref 26.0–34.0)
MCHC: 31.6 g/dL (ref 30.0–36.0)
MCV: 92.1 fL (ref 80.0–100.0)
Platelets: 209 10*3/uL (ref 150–400)
RBC: 3.78 MIL/uL — ABNORMAL LOW (ref 3.87–5.11)
RDW: 14.2 % (ref 11.5–15.5)
WBC: 5 10*3/uL (ref 4.0–10.5)
nRBC: 0 % (ref 0.0–0.2)

## 2021-03-18 LAB — COMPREHENSIVE METABOLIC PANEL
ALT: 39 U/L (ref 0–44)
AST: 30 U/L (ref 15–41)
Albumin: 3.6 g/dL (ref 3.5–5.0)
Alkaline Phosphatase: 86 U/L (ref 38–126)
Anion gap: 12 (ref 5–15)
BUN: 68 mg/dL — ABNORMAL HIGH (ref 8–23)
CO2: 20 mmol/L — ABNORMAL LOW (ref 22–32)
Calcium: 8.9 mg/dL (ref 8.9–10.3)
Chloride: 100 mmol/L (ref 98–111)
Creatinine, Ser: 3.22 mg/dL — ABNORMAL HIGH (ref 0.44–1.00)
GFR, Estimated: 15 mL/min — ABNORMAL LOW (ref >60)
Glucose, Bld: 223 mg/dL — ABNORMAL HIGH (ref 70–99)
Potassium: 4.3 mmol/L (ref 3.5–5.1)
Sodium: 132 mmol/L — ABNORMAL LOW (ref 135–145)
Total Bilirubin: 0.6 mg/dL (ref 0.3–1.2)
Total Protein: 7.3 g/dL (ref 6.5–8.1)

## 2021-03-18 LAB — RESP PANEL BY RT-PCR (FLU A&B, COVID) ARPGX2
Influenza A by PCR: NEGATIVE
Influenza B by PCR: NEGATIVE
SARS Coronavirus 2 by RT PCR: NEGATIVE

## 2021-03-18 LAB — DIFFERENTIAL
Abs Immature Granulocytes: 0.04 10*3/uL (ref 0.00–0.07)
Basophils Absolute: 0 10*3/uL (ref 0.0–0.1)
Basophils Relative: 0 %
Eosinophils Absolute: 0.2 10*3/uL (ref 0.0–0.5)
Eosinophils Relative: 3 %
Immature Granulocytes: 1 %
Lymphocytes Relative: 23 %
Lymphs Abs: 1.2 10*3/uL (ref 0.7–4.0)
Monocytes Absolute: 0.8 10*3/uL (ref 0.1–1.0)
Monocytes Relative: 16 %
Neutro Abs: 2.8 10*3/uL (ref 1.7–7.7)
Neutrophils Relative %: 57 %

## 2021-03-18 LAB — APTT: aPTT: 30 seconds (ref 24–36)

## 2021-03-18 LAB — CBG MONITORING, ED
Glucose-Capillary: 191 mg/dL — ABNORMAL HIGH (ref 70–99)
Glucose-Capillary: 173 mg/dL — ABNORMAL HIGH (ref 70–99)
Glucose-Capillary: 176 mg/dL — ABNORMAL HIGH (ref 70–99)

## 2021-03-18 LAB — HEMOGLOBIN A1C
Hgb A1c MFr Bld: 11.7 % — ABNORMAL HIGH (ref 4.8–5.6)
Mean Plasma Glucose: 289 mg/dL

## 2021-03-18 LAB — PROTIME-INR
INR: 1.1 (ref 0.8–1.2)
Prothrombin Time: 13.9 seconds (ref 11.4–15.2)

## 2021-03-18 LAB — ETHANOL: Alcohol, Ethyl (B): <10 mg/dL (ref <10)

## 2021-03-18 LAB — GLUCOSE, CAPILLARY: Glucose-Capillary: 119 mg/dL — ABNORMAL HIGH (ref 70–99)

## 2021-03-18 MED ORDER — SODIUM CHLORIDE 0.9 % IV SOLN: INTRAVENOUS | Status: DC

## 2021-03-18 MED ORDER — SALINE SPRAY 0.65 % NA SOLN
1.0000 | NASAL | Status: DC | PRN
  Administered 2021-03-18: 1 via NASAL
  Filled 2021-03-18: qty 44

## 2021-03-18 MED ORDER — ASPIRIN 81 MG PO CHEW
81.0000 mg | CHEWABLE_TABLET | Freq: Every day | ORAL | Status: DC
Start: 2021-03-19 — End: 2021-03-21
  Administered 2021-03-19 – 2021-03-21 (×3): 81 mg via ORAL
  Filled 2021-03-18 (×3): qty 1

## 2021-03-18 MED ORDER — SENNOSIDES-DOCUSATE SODIUM 8.6-50 MG PO TABS: 1.0000 | ORAL_TABLET | Freq: Every evening | ORAL | Status: DC | PRN

## 2021-03-18 MED ORDER — INSULIN ASPART 100 UNIT/ML IJ SOLN
0.0000 [IU] | Freq: Three times a day (TID) | INTRAMUSCULAR | Status: DC
  Administered 2021-03-18: 2 [IU] via SUBCUTANEOUS
  Administered 2021-03-19: 1 [IU] via SUBCUTANEOUS
  Administered 2021-03-19: 2 [IU] via SUBCUTANEOUS
  Administered 2021-03-20: 5 [IU] via SUBCUTANEOUS
  Administered 2021-03-20: 3 [IU] via SUBCUTANEOUS
  Administered 2021-03-20: 1 [IU] via SUBCUTANEOUS
  Administered 2021-03-21: 5 [IU] via SUBCUTANEOUS
  Administered 2021-03-21: 2 [IU] via SUBCUTANEOUS
  Filled 2021-03-18: qty 1

## 2021-03-18 MED ORDER — INSULIN ASPART 100 UNIT/ML IJ SOLN
4.0000 [IU] | Freq: Three times a day (TID) | INTRAMUSCULAR | Status: DC
  Administered 2021-03-18 – 2021-03-21 (×7): 4 [IU] via SUBCUTANEOUS
  Filled 2021-03-18: qty 1

## 2021-03-18 MED ORDER — ACETAMINOPHEN 650 MG RE SUPP: 650.0000 mg | RECTAL | Status: DC | PRN

## 2021-03-18 MED ORDER — STROKE: EARLY STAGES OF RECOVERY BOOK
Freq: Once | Status: DC
  Filled 2021-03-18: qty 1

## 2021-03-18 MED ORDER — CLOPIDOGREL BISULFATE 75 MG PO TABS
75.0000 mg | ORAL_TABLET | Freq: Every day | ORAL | Status: DC
  Administered 2021-03-19: 75 mg via ORAL
  Filled 2021-03-18: qty 1

## 2021-03-18 MED ORDER — ENOXAPARIN SODIUM 30 MG/0.3ML IJ SOSY
30.0000 mg | PREFILLED_SYRINGE | INTRAMUSCULAR | Status: DC
  Administered 2021-03-18 – 2021-03-20 (×3): 30 mg via SUBCUTANEOUS
  Filled 2021-03-18 (×3): qty 0.3

## 2021-03-18 MED ORDER — INSULIN GLARGINE-YFGN 100 UNIT/ML ~~LOC~~ SOLN
12.0000 [IU] | Freq: Every day | SUBCUTANEOUS | Status: DC
  Administered 2021-03-18 – 2021-03-20 (×3): 12 [IU] via SUBCUTANEOUS
  Filled 2021-03-18 (×5): qty 0.12

## 2021-03-18 MED ORDER — ACETAMINOPHEN 160 MG/5ML PO SOLN: 650.0000 mg | ORAL | Status: DC | PRN

## 2021-03-18 MED ORDER — SODIUM CHLORIDE 0.9 % IV BOLUS
500.0000 mL | Freq: Once | INTRAVENOUS | Status: AC
  Administered 2021-03-18: 500 mL via INTRAVENOUS

## 2021-03-18 MED ORDER — ACETAMINOPHEN 325 MG PO TABS
650.0000 mg | ORAL_TABLET | ORAL | Status: DC | PRN
  Administered 2021-03-19 – 2021-03-20 (×3): 650 mg via ORAL
  Filled 2021-03-18 (×3): qty 2

## 2021-03-18 MED ORDER — ROSUVASTATIN CALCIUM 5 MG PO TABS
10.0000 mg | ORAL_TABLET | Freq: Every evening | ORAL | Status: DC
Start: 2021-03-18 — End: 2021-03-19
  Administered 2021-03-18: 10 mg via ORAL
  Filled 2021-03-18: qty 1

## 2021-03-18 MED ORDER — ROSUVASTATIN CALCIUM 20 MG PO TABS
40.0000 mg | ORAL_TABLET | Freq: Every evening | ORAL | Status: DC
Start: 2021-03-18 — End: 2021-03-18

## 2021-03-18 NOTE — ED Notes (Signed)
Dr Alvino Chapel notified of pt creatinine level.

## 2021-03-18 NOTE — ED Provider Notes (Signed)
[facility] EMERGENCY DEPARTMENT Provider Note   CSN: [id number] Arrival date & time: 03/18/21  0962  An emergency department physician performed an initial assessment on this suspected stroke patient at 1048.  History  Chief Complaint  Patient presents with   Code Stroke    Briana Garrison is a 71 y.o. female.  HPI Patient came in by EMS for mental status change.  Slurred speech left arm flaccid and left leg weakness.  Reportedly woke up with it at 7:00 this morning.  Last seen well last night was 9 PM.  Discussed with patient's husband.  Somewhat difficult to get history but he states that she does not walk but also states she has both use and does not have use of the left upper extremity.  Reviewing notes it appears that she does at baseline get around pretty well with maybe minimal weakness from previous stroke.  Found to have a facial droop and left-sided weakness.  Some difficulty speaking.  Mildly hypotensive.  Sugar reassuring.  Code stroke activated.  Does have chronic renal sufficiency and I discussed with Dr. Leonel Ramsay about CTA and potentially perfusion.  I decided with his consult that since we can get an urgent MRI even with a reported 15-minute delay it would be better versus potentially throwing her into dialysis now that her creatinine is up to 3.6.  Reviewed previous MRI did show some chronic disease.  Fluid bolus given for hypotension.    Home Medications Prior to Admission medications   Medication Sig Start Date End Date Taking? Authorizing Provider  acetaminophen (TYLENOL) 500 MG tablet Take 500 mg by mouth every 6 (six) hours as needed for mild pain or headache.     [provider]  albuterol (VENTOLIN HFA) 108 (90 Base) MCG/ACT inhaler Inhale 2 puffs into the lungs every 6 (six) hours as needed for wheezing or shortness of breath.    [provider]  aspirin 81 MG chewable tablet Chew 1 tablet (81 mg total) by mouth daily. Patient not taking:  Reported on 01/31/2021 01/05/21   Barton Dubois, MD  carvedilol (COREG) 25 MG tablet Take 1 tablet (25 mg total) by mouth 2 (two) times daily. 01/04/21   Barton Dubois, MD  Cholecalciferol (VITAMIN D-3) 25 MCG (1000 UT) CAPS Take 1,000 Units by mouth daily.     [provider]  cloNIDine (CATAPRES) 0.1 MG tablet Take 0.1 mg by mouth See admin instructions. Take 1 and 1/2 tablet twice a day    [provider]  clopidogrel (PLAVIX) 75 MG tablet Take 1 tablet by mouth once daily Patient taking differently: Take 75 mg by mouth See admin instructions. Take 1/2 tablet in the morning and 1/2 tablet every evening 10/11/20   Garvin Fila, MD  Dulaglutide (TRULICITY) 4.5 EZ/6.6QH SOPN Inject 4.5 mg into the skin every 7 (seven) days. Patient not taking: Reported on 01/31/2021    [provider]  fluticasone (FLONASE) 50 MCG/ACT nasal spray Place 1 spray into both nostrils daily as needed for allergies. 01/08/21   [provider]  gabapentin (NEURONTIN) 100 MG capsule Take 100 mg by mouth daily as needed (shoulder pain).    [provider]  glucose 4 GM chewable tablet Chew 1 tablet by mouth once as needed for low blood sugar.    [provider]  insulin aspart (NOVOLOG FLEXPEN) 100 UNIT/ML FlexPen Inject 4-12 Units into the skin 3 (three) times daily with meals. Per sliding scale - based on CBG  [provider]  insulin glargine (LANTUS) 100 unit/mL SOPN Inject 14 Units into the skin 2 (two) times daily. 14 units am    6 units bedtime 01/04/21   Barton Dubois, MD  rosuvastatin (CRESTOR) 40 MG tablet Take 40 mg by mouth daily. 08/30/19   [provider]  Semaglutide,0.25 or 0.5MG /DOS, (OZEMPIC, 0.25 OR 0.5 MG/DOSE,) 2 MG/1.5ML SOPN Inject 0.5 mg into the vein once a week. wednesday    [provider]  senna-docusate (SENOKOT-S) 8.6-50 MG tablet Take 1 tablet by mouth daily as needed for mild constipation.    [provider]   telmisartan (MICARDIS) 20 MG tablet Take 20 mg by mouth daily.  08/30/19   [provider]  vitamin B-12 (CYANOCOBALAMIN) 1000 MCG tablet Take 1,000 mcg by mouth daily.    [provider]      Allergies    Amlodipine and Tramadol    Review of Systems   Review of Systems  Constitutional:  Negative for appetite change.  Musculoskeletal:  Negative for back pain.  Neurological:  Positive for speech difficulty and weakness. Negative for numbness.   Physical Exam Updated Vital Signs BP 114/62    Pulse 76    Temp 98.1 F (36.7 C) (Oral)    Resp 17    Wt 83.5 kg    SpO2 97%    BMI 34.79 kg/m  Physical Exam Vitals and nursing note reviewed.  Cardiovascular:     Rate and Rhythm: Regular rhythm.  Pulmonary:     Breath sounds: No wheezing or rhonchi.  Abdominal:     Tenderness: There is no abdominal tenderness.  Musculoskeletal:     Cervical back: Neck supple.  Neurological:     Mental Status: She is alert.     Comments: It does not seem like she necessarily has to come in now left upper extremity mostly flaccid.  Left-sided facial droop.  Eyes do not like to cross to the left although does go somewhat past midline.  Decreased strength in right lower extremity.  May have some  expressive aphasia also.    ED Results / Procedures / Treatments   Labs (all labs ordered are listed, but only abnormal results are displayed) Labs Reviewed  CBC - Abnormal; Notable for the following components:      Result Value   RBC 3.78 (*)    Hemoglobin 11.0 (*)    HCT 34.8 (*)    All other components within normal limits  COMPREHENSIVE METABOLIC PANEL - Abnormal; Notable for the following components:   Sodium 132 (*)    CO2 20 (*)    Glucose, Bld 223 (*)    BUN 68 (*)    Creatinine, Ser 3.22 (*)    GFR, Estimated 15 (*)    All other components within normal limits  CBG MONITORING, ED - Abnormal; Notable for the following components:   Glucose-Capillary 191 (*)    All other  components within normal limits  I-STAT CHEM 8, ED - Abnormal; Notable for the following components:   BUN 68 (*)    Creatinine, Ser 3.60 (*)    Glucose, Bld 217 (*)    All other components within normal limits  RESP PANEL BY RT-PCR (FLU A&B, COVID) ARPGX2  ETHANOL  PROTIME-INR  APTT  DIFFERENTIAL  RAPID URINE DRUG SCREEN, HOSP PERFORMED  URINALYSIS, ROUTINE W REFLEX MICROSCOPIC    EKG None  Radiology MR ANGIO HEAD WO CONTRAST  Result Date: 03/18/2021 CLINICAL DATA:  Neuro deficit,  acute, stroke suspected EXAM: MRI HEAD WITHOUT CONTRAST MRA HEAD WITHOUT CONTRAST TECHNIQUE: Multiplanar, multi-echo pulse sequences of the brain and surrounding structures were acquired without intravenous contrast. Angiographic images of the Circle of Willis were acquired using MRA technique without intravenous contrast. COMPARISON:  01/03/2021 FINDINGS: MRI HEAD Brain: Scattered small foci of reduced diffusion in the right frontoparietal lobes. Additional small focus in the left gangliocapsular region. Chronic cortical/subcortical right parietal infarct. Chronic small vessel infarcts of the deep gray nuclei and adjacent white matter, pons extending into the right brachium pontis, and corpus callosum. Additional patchy and confluent areas of T2 hyperintensity in the supratentorial and pontine white matter are nonspecific but probably reflect stable moderate chronic microvascular ischemic changes. There is no intracranial mass or mass effect. Punctate focus of susceptibility is again identified along the medial left temporal lobe likely reflecting chronic microhemorrhage. There is no hydrocephalus or extra-axial fluid collection. Vascular: Unchanged diminished right vertebral artery flow void. Skull and upper cervical spine: Normal marrow signal is preserved. Sinuses/Orbits: Chronic polypoid paranasal sinus inflammatory changes. Bilateral lens replacements. Other: Sella is unremarkable.  Mastoid air cells are clear.  MRA HEAD Degraded by artifact. Intracranial internal carotid arteries are patent. Middle and anterior cerebral arteries are patent. Stable stenosis at the right MCA bifurcation. Redemonstrated marked stenosis of proximal right M2 MCA branch. Stenosis of a distal left M2 MCA branch is again noted. Intracranial vertebral arteries are minimally included. Basilar artery is patent. Posterior cerebral arteries are patent with atherosclerotic irregularity. Distal right P2 and right P2-P3 junction stenoses again noted. IMPRESSION: Small acute infarcts right frontoparietal lobes. Additional small infarct in the left gangliocapsular region. Stable chronic findings detailed above. MRA degraded by artifact. No new proximal intracranial vessel occlusion. There is similar marked stenosis of a proximal right M2 MCA branch. Electronically Signed   By: Macy Mis M.D.   On: 03/18/2021 12:29   MR BRAIN WO CONTRAST  Result Date: 03/18/2021 CLINICAL DATA:  Neuro deficit, acute, stroke suspected EXAM: MRI HEAD WITHOUT CONTRAST MRA HEAD WITHOUT CONTRAST TECHNIQUE: Multiplanar, multi-echo pulse sequences of the brain and surrounding structures were acquired without intravenous contrast. Angiographic images of the Circle of Willis were acquired using MRA technique without intravenous contrast. COMPARISON:  01/03/2021 FINDINGS: MRI HEAD Brain: Scattered small foci of reduced diffusion in the right frontoparietal lobes. Additional small focus in the left gangliocapsular region. Chronic cortical/subcortical right parietal infarct. Chronic small vessel infarcts of the deep gray nuclei and adjacent white matter, pons extending into the right brachium pontis, and corpus callosum. Additional patchy and confluent areas of T2 hyperintensity in the supratentorial and pontine white matter are nonspecific but probably reflect stable moderate chronic microvascular ischemic changes. There is no intracranial mass or mass effect. Punctate focus of  susceptibility is again identified along the medial left temporal lobe likely reflecting chronic microhemorrhage. There is no hydrocephalus or extra-axial fluid collection. Vascular: Unchanged diminished right vertebral artery flow void. Skull and upper cervical spine: Normal marrow signal is preserved. Sinuses/Orbits: Chronic polypoid paranasal sinus inflammatory changes. Bilateral lens replacements. Other: Sella is unremarkable.  Mastoid air cells are clear. MRA HEAD Degraded by artifact. Intracranial internal carotid arteries are patent. Middle and anterior cerebral arteries are patent. Stable stenosis at the right MCA bifurcation. Redemonstrated marked stenosis of proximal right M2 MCA branch. Stenosis of a distal left M2 MCA branch is again noted. Intracranial vertebral arteries are minimally included. Basilar artery is patent. Posterior cerebral arteries are patent with atherosclerotic irregularity. Distal right P2  and right P2-P3 junction stenoses again noted. IMPRESSION: Small acute infarcts right frontoparietal lobes. Additional small infarct in the left gangliocapsular region. Stable chronic findings detailed above. MRA degraded by artifact. No new proximal intracranial vessel occlusion. There is similar marked stenosis of a proximal right M2 MCA branch. Electronically Signed   By: Macy Mis M.D.   On: 03/18/2021 12:29   CT HEAD CODE STROKE WO CONTRAST`  Result Date: 03/18/2021 CLINICAL DATA:  Code stroke. 71 year old female with left side weakness. Last seen well 0930 hours. EXAM: CT HEAD WITHOUT CONTRAST TECHNIQUE: Contiguous axial images were obtained from the base of the skull through the vertex without intravenous contrast. RADIATION DOSE REDUCTION: This exam was performed according to the departmental dose-optimization program which includes automated exposure control, adjustment of the mA and/or kV according to patient size and/or use of iterative reconstruction technique. COMPARISON:   Brain MRI 01/03/2021.  Head CT 01/31/2021. FINDINGS: Brain: Advanced chronic small vessel disease, multiple chronic lacunar infarcts in the bilateral deep gray matter nuclei and brainstem as demonstrated by MRI in December. Associated patchy asymmetric white matter hypodensity is stable and greater in the right hemisphere. Subtle associated chronic right parietal lobe cortical encephalomalacia. No midline shift, ventriculomegaly, mass effect, evidence of mass lesion, intracranial hemorrhage or evidence of cortically based acute infarction. Vascular: Calcified atherosclerosis at the skull base. No suspicious intracranial vascular hyperdensity. Skull: No acute osseous abnormality identified. Sinuses/Orbits: Stable chronic paranasal sinusitis mostly on the right side. Tympanic cavities and mastoids remain well aerated. Other: Mild right gaze deviation. Visualized scalp soft tissues are within normal limits. ASPECTS [facility] Stroke Program Early CT Score) Total score (0-10 with 10 being normal): 10 IMPRESSION: 1. No acute cortically based infarct or acute intracranial hemorrhage identified. ASPECTS 10. 2. Advanced chronic small vessel disease appears stable from MRI in December. 3. Chronic paranasal sinusitis. Study discussed by telephone with Dr. Davonna Belling on 03/18/2021 at 11:15 . Electronically Signed   By: Genevie Ann M.D.   On: 03/18/2021 11:16    Procedures Procedures    Medications Ordered in ED Medications  sodium chloride 0.9 % bolus 500 mL (500 mLs Intravenous Bolus 03/18/21 1200)    ED Course/ Medical Decision Making/ A&P                           Medical Decision Making Amount and/or Complexity of Data Reviewed External Data Reviewed: notes.    Details: Previous neurology notes. Labs: ordered. Decision-making details documented in ED Course. Radiology: ordered and independent interpretation performed.    Details: Independently interpreted CT scan. ECG/medicine tests: independent  interpretation performed. Discussion of management or test interpretation with external provider(s): Dr. Leonel Ramsay from neurology.  Risk Decision regarding hospitalization.  Critical Care Total time providing critical care: 30-74 minutes  Patient presented with focal neurodeficits.  Last normal last night but potentially LVO positive with left-sided deficits and neglect.  Does have some known stenosis on the right side.  Did have hypotension.  Fluid bolus given.  Had been in consult with Dr. Leonel Ramsay from neurology.  Initial head CT reassuring.  Taken emergently to MRI.  Felt the delay was justified to help save kidney function if needed.  Does show some acute strokes.  However with blood pressure improved the deficits have improved also.  May have been due to hypoperfusion from hypotension.  Discussed with Dr. Leonel Ramsay.  Fluids to keep blood pressure up.  Transfer to [facility] for admission to  the medicine service.  Does not need acute intervention but needed to talk with interventionalists about possible later intervention.        Final Clinical Impression(s) / ED Diagnoses Final diagnoses:  Cerebrovascular accident (CVA), unspecified mechanism [facility])    Rx / [place] Orders ED Discharge Orders     None         Davonna Belling, MD 03/18/21 1324

## 2021-03-18 NOTE — ED Notes (Signed)
Facial palsy resolved as well as neglect of right side. Neurologist Dr. Leonel Ramsay notified and aware. Dr. Leonel Ramsay spoke with MRI to review imaging and reports that pt will not receive TnK. He believes that pt's low bp paired with stenosis of intracranial arteries caused symptoms and they should begin to improve with bp improvement. Verbal order to continue neurochecks q2h at this time

## 2021-03-18 NOTE — Assessment & Plan Note (Addendum)
Code stroke called and teleneurology consult with Dr. Leonel Ramsay and recommendations for:   Avoid hypotension -allow permissive hypertension and holding home blood pressure medications  Continue dual antiplatelet therapy with aspirin and Plavix (patient may not have been taking aspirin and seems confused)  Continue rosuvastatin 40 mg daily  Continue neurochecks as ordered  PT OT and ST evaluation requested  Admitting to [facility] per neurology recommendation  Please notify neurology team when patient arrives at [facility]

## 2021-03-18 NOTE — Assessment & Plan Note (Signed)
Fortunately symptoms have resolved after blood pressures have improved. Continue neurochecks

## 2021-03-18 NOTE — Assessment & Plan Note (Signed)
Suspect secondary to mild dehydration associated with taking blood pressure medications Treated with IV fluids and now resolved and BP is recovering Holding home blood pressure medications at this time and allowing permissive hypertension in setting of acute CVA

## 2021-03-18 NOTE — Hospital Course (Signed)
71 y/o with history of prior strokes (followed by Dr. Leonie Man), had been on aspirin and plavix (pt unsure if she was taking both for my history), type 2 diabetes mellitus on insulin, hyperlipidemia, chronic renal insufficiency stage IV (followed by [facility]), hypertension, microalbuminuria, LVH, history of renal carcinoma, normocytic anemia reportedly woke up around 7 AM with slurred speech and a flaccid left arm and left leg weakness and fell out of bed when she was trying to get out of bed.  This is very unusual for her.  She has had a previous CVA with minimal left-sided symptoms.  However she reports that she could not move her left upper extremity this morning.  Family reports that she also had some difficulty speaking at the time.  She was evaluated in the ED and found to be hypotensive.  She had some signs of dehydration and acute kidney injury.  She was sent for urgent MRI without contrast.  MRI showed findings of acute CVA in the right frontoparietal lobes.  There was marked stenosis in the right proximal M2 MCA branch.  Code stroke was called and patient was evaluated by Dr. Leonel Ramsay.  After treatment of her low blood pressure and BP improved fortunately her symptoms started to resolve.  He recommended avoiding hypotension.  He recommended continuing her current medical therapy and he recommended that she be transferred to [facility] for observation in case further therapies are needed to be considered.  EKG was without acute ST-T wave changes.  Ethyl alcohol less than 10.  SARS 2 coronavirus test negative.  Influenza testing negative.  Glucose mildly elevated at 217.  Creatinine elevated at 3.60.  Baseline creatinine around 2.2.  Hospitalist consulted for admission.

## 2021-03-18 NOTE — Assessment & Plan Note (Signed)
Patient has stage IV CKD and followed closely by her nephrologist Dr. Rockwell Germany with [facility] nephrology in [place]. Suspect she is mildly dehydrated. She has a soft blood pressure and she is being given IV fluids for rehydration. Renally dose medications as appropriate. Recheck renal function in a.m.

## 2021-03-18 NOTE — Progress Notes (Addendum)
Code stroke activated at 1044. ED Provider at bedside for assessment at 1045. Patient taken to CT at 1055.Neurology paged by Telestroke RN at [phone]. Dr. Leonel Ramsay connected to telestroke cart for code stroke evaluation at 1058.

## 2021-03-18 NOTE — Assessment & Plan Note (Signed)
Renally dose medications as appropriate Patient is followed closely by her nephrologist with [facility]

## 2021-03-18 NOTE — Progress Notes (Signed)
Pt returned to ED room from MRI at 1147.

## 2021-03-18 NOTE — H&P (Addendum)
History and Physical  [facility]  Briana Garrison OAC:[id number] DOB: January 13, 1951 DOA: 03/18/2021  PCP: [name], MD  Patient coming from: Home  Level of care: Telemetry Medical  I have personally briefly reviewed patient's old medical records in [place]  Chief Complaint: left side weakness  HPI: Briana Garrison is a 71 y/o with history of prior strokes (followed by Dr. Leonie Man), had been on aspirin and plavix (pt unsure if she was taking both for my history), type 2 diabetes mellitus on insulin, hyperlipidemia, chronic renal insufficiency stage IV (followed by Lynford Citizen), hypertension, microalbuminuria, LVH, history of renal carcinoma, normocytic anemia reportedly woke up around 7 AM with slurred speech and a flaccid left arm and left leg weakness and fell out of bed when she was trying to get out of bed.  This is very unusual for her.  She has had a previous CVA with minimal left-sided symptoms.  However she reports that she could not move her left upper extremity this morning.  Family reports that she also had some difficulty speaking at the time.  She was evaluated in the ED and found to be hypotensive.  She had some signs of dehydration and acute kidney injury.  She was sent for urgent MRI without contrast.  MRI showed findings of acute CVA in the right frontoparietal lobes.  There was marked stenosis in the right proximal M2 MCA branch.  Code stroke was called and patient was evaluated by Dr. Leonel Ramsay.  After treatment of her low blood pressure and BP improved fortunately her symptoms started to resolve.  He recommended avoiding hypotension.  He recommended continuing her current medical therapy and he recommended that she be transferred to [facility] for observation in case further therapies are needed to be considered.  EKG was without acute ST-T wave changes.  Ethyl alcohol less than 10.  SARS 2 coronavirus test negative.  Influenza testing negative.  Glucose mildly  elevated at 217.  Creatinine elevated at 3.60.  Baseline creatinine around 2.2.  Hospitalist consulted for admission.  Review of Systems: Review of Systems  Constitutional:  Negative for fever.  HENT: Negative.    Eyes: Negative.   Respiratory: Negative.    Cardiovascular: Negative.   Gastrointestinal: Negative.   Genitourinary: Negative.   Musculoskeletal: Negative.   Skin: Negative.   Neurological:  Positive for sensory change, speech change, focal weakness and weakness.  Endo/Heme/Allergies: Negative.   Psychiatric/Behavioral: Negative.    All other systems reviewed and are negative.   Past Medical History:  Diagnosis Date   Cancer of kidney ([place]) 05/14/2009   Diabetes mellitus    Endometrial mass    GERD (gastroesophageal reflux disease)    Hypercholesterolemia    Hypertension    Internal hemorrhoids    S/P colonoscopy 06/10/00 by Dr. Lajoyce Corners   Shingles    Stroke [facility]) 09/2019   2 strokes    Past Surgical History:  Procedure Laterality Date   BIOPSY  09/06/2018   Procedure: BIOPSY;  Surgeon: Rogene Houston, MD;  [facility];  Service: Endoscopy;;   CHOLECYSTECTOMY  11/13/08   COLONOSCOPY N/A 09/06/2018   Procedure: COLONOSCOPY;  Surgeon: Rogene Houston, MD;  Location: [facility];  Service: Endoscopy;  Laterality: N/A;  [place]  05/14/09   Diagnostic hysteroscopy, D&C, endometrial polypectomy  07/01/2005   LIPECTOMY     POLYPECTOMY  09/06/2018   Procedure: POLYPECTOMY;  Surgeon: Hildred Laser  U, MD;  Location: [facility];  Service: Endoscopy;;     reports that she has never smoked. She has never used smokeless tobacco. She reports that she does not drink alcohol and does not use drugs.  Allergies  Allergen Reactions   Amlodipine Swelling   Tramadol Diarrhea and Nausea And Vomiting    Family History  Problem Relation Age of Onset   Ovarian cancer Mother    Stroke Mother    Stroke Father    Stroke Brother     Stroke Sister     Prior to Admission medications   Medication Sig Start Date End Date Taking? Authorizing Provider  albuterol (VENTOLIN HFA) 108 (90 Base) MCG/ACT inhaler Inhale 2 puffs into the lungs every 6 (six) hours as needed for wheezing or shortness of breath.   Yes [provider]  aspirin 81 MG chewable tablet Chew 1 tablet (81 mg total) by mouth daily. 01/05/21  Yes Barton Dubois, MD  clopidogrel (PLAVIX) 75 MG tablet Take 1 tablet by mouth once daily Patient taking differently: Take 75 mg by mouth See admin instructions. Take 1/2 tablet in the morning and 1/2 tablet every evening 10/11/20  Yes Garvin Fila, MD  acetaminophen (TYLENOL) 500 MG tablet Take 500 mg by mouth every 6 (six) hours as needed for mild pain or headache.     [provider]  carvedilol (COREG) 25 MG tablet Take 1 tablet (25 mg total) by mouth 2 (two) times daily. 01/04/21   Barton Dubois, MD  Cholecalciferol (VITAMIN D-3) 25 MCG (1000 UT) CAPS Take 1,000 Units by mouth daily.     [provider]  cloNIDine (CATAPRES) 0.1 MG tablet Take 0.1 mg by mouth See admin instructions. Take 1 and 1/2 tablet twice a day    [provider]  Dulaglutide (TRULICITY) 4.5 DX/8.3JA SOPN Inject 4.5 mg into the skin every 7 (seven) days. Patient not taking: Reported on 01/31/2021    [provider]  fluticasone (FLONASE) 50 MCG/ACT nasal spray Place 1 spray into both nostrils daily as needed for allergies. 01/08/21   [provider]  gabapentin (NEURONTIN) 100 MG capsule Take 100 mg by mouth daily as needed (shoulder pain).    [provider]  glucose 4 GM chewable tablet Chew 1 tablet by mouth once as needed for low blood sugar.    [provider]  insulin aspart (NOVOLOG FLEXPEN) 100 UNIT/ML FlexPen Inject 4-12 Units into the skin 3 (three) times daily with meals. Per sliding scale - based on CBG    [provider]  insulin glargine (LANTUS) 100  unit/mL SOPN Inject 14 Units into the skin 2 (two) times daily. 14 units am    6 units bedtime 01/04/21   Barton Dubois, MD  rosuvastatin (CRESTOR) 40 MG tablet Take 40 mg by mouth daily. 08/30/19   [provider]  Semaglutide,0.25 or 0.5MG /DOS, (OZEMPIC, 0.25 OR 0.5 MG/DOSE,) 2 MG/1.5ML SOPN Inject 0.5 mg into the vein once a week. wednesday    [provider]  senna-docusate (SENOKOT-S) 8.6-50 MG tablet Take 1 tablet by mouth daily as needed for mild constipation.    [provider]  telmisartan (MICARDIS) 20 MG tablet Take 20 mg by mouth daily.  08/30/19   [provider]  vitamin B-12 (CYANOCOBALAMIN) 1000 MCG tablet Take 1,000 mcg by mouth daily.    [provider]    Physical Exam: Vitals:   03/18/21 1245 03/18/21 1300 03/18/21 1315 03/18/21 1400  BP:  119/60 128/66 (!) 141/67   Pulse: 70 73 71   Resp: 18 16 18    Temp:      TempSrc:      SpO2: 100% 95% 99%   Weight:    84.6 kg  Height:    5\' 1"  (1.549 m)    Constitutional: NAD, calm, comfortable Eyes: PERRL, lids and conjunctivae normal ENMT: Mucous membranes are moist. Posterior pharynx clear of any exudate or lesions.Normal dentition.  Neck: normal, supple, no masses, no thyromegaly Respiratory: clear to auscultation bilaterally, no wheezing, no crackles. Normal respiratory effort. No accessory muscle use.  Cardiovascular: normal s1, s2 sounds, no murmurs / rubs / gallops. No extremity edema. 2+ pedal pulses. No carotid bruits.  Abdomen: no tenderness, no masses palpated. No hepatosplenomegaly. Bowel sounds positive.  Musculoskeletal: no clubbing / cyanosis. No joint deformity upper and lower extremities. Good ROM, no contractures. Normal muscle tone.  Skin: no rashes, lesions, ulcers. No induration Neurologic: CN 2-12 grossly intact. Sensation intact, DTR normal. Strength 5/5 in all 4.  Psychiatric: Normal judgment and insight. Alert and oriented x 3. Normal mood.   Labs on  Admission: I have personally reviewed following labs and imaging studies  CBC: Recent Labs  Lab 03/18/21 1051  WBC 5.0  NEUTROABS 2.8  HGB 11.0*   12.2  HCT 34.8*   36.0  MCV 92.1  PLT 086   Basic Metabolic Panel: Recent Labs  Lab 03/18/21 1051  NA 132*   135  K 4.3   4.4  CL 100   103  CO2 20*  GLUCOSE 223*   217*  BUN 68*   68*  CREATININE 3.22*   3.60*  CALCIUM 8.9   GFR: Estimated Creatinine Clearance: 14.1 mL/min (A) (by C-G formula based on SCr of 3.6 mg/dL (H)). Liver Function Tests: Recent Labs  Lab 03/18/21 1051  AST 30  ALT 39  ALKPHOS 86  BILITOT 0.6  PROT 7.3  ALBUMIN 3.6   No results for input(s): LIPASE, AMYLASE in the last 168 hours. No results for input(s): AMMONIA in the last 168 hours. Coagulation Profile: Recent Labs  Lab 03/18/21 1051  INR 1.1   Cardiac Enzymes: No results for input(s): CKTOTAL, CKMB, CKMBINDEX, TROPONINI in the last 168 hours. BNP (last 3 results) No results for input(s): PROBNP in the last 8760 hours. HbA1C: No results for input(s): HGBA1C in the last 72 hours. CBG: Recent Labs  Lab 03/18/21 1048  GLUCAP 191*   Lipid Profile: No results for input(s): CHOL, HDL, LDLCALC, TRIG, CHOLHDL, LDLDIRECT in the last 72 hours. Thyroid Function Tests: No results for input(s): TSH, T4TOTAL, FREET4, T3FREE, THYROIDAB in the last 72 hours. Anemia Panel: No results for input(s): VITAMINB12, FOLATE, FERRITIN, TIBC, IRON, RETICCTPCT in the last 72 hours. Urine analysis:    Component Value Date/Time   COLORURINE YELLOW 01/03/2021 0411   APPEARANCEUR CLEAR 01/03/2021 0411   LABSPEC 1.015 01/03/2021 0411   PHURINE 6.0 01/03/2021 0411   GLUCOSEU >=500 (A) 01/03/2021 0411   HGBUR NEGATIVE 01/03/2021 0411   BILIRUBINUR NEGATIVE 01/03/2021 0411   KETONESUR NEGATIVE 01/03/2021 0411   PROTEINUR NEGATIVE 01/03/2021 0411   UROBILINOGEN 0.2 06/23/2011 1100   NITRITE NEGATIVE 01/03/2021 0411   LEUKOCYTESUR SMALL (A) 01/03/2021  0411    Radiological Exams on Admission: MR ANGIO HEAD WO CONTRAST  Result Date: 03/18/2021 CLINICAL DATA:  Neuro deficit, acute, stroke suspected EXAM: MRI HEAD WITHOUT CONTRAST MRA HEAD WITHOUT CONTRAST TECHNIQUE: Multiplanar, multi-echo pulse sequences of the brain and  surrounding structures were acquired without intravenous contrast. Angiographic images of the Circle of Willis were acquired using MRA technique without intravenous contrast. COMPARISON:  01/03/2021 FINDINGS: MRI HEAD Brain: Scattered small foci of reduced diffusion in the right frontoparietal lobes. Additional small focus in the left gangliocapsular region. Chronic cortical/subcortical right parietal infarct. Chronic small vessel infarcts of the deep gray nuclei and adjacent white matter, pons extending into the right brachium pontis, and corpus callosum. Additional patchy and confluent areas of T2 hyperintensity in the supratentorial and pontine white matter are nonspecific but probably reflect stable moderate chronic microvascular ischemic changes. There is no intracranial mass or mass effect. Punctate focus of susceptibility is again identified along the medial left temporal lobe likely reflecting chronic microhemorrhage. There is no hydrocephalus or extra-axial fluid collection. Vascular: Unchanged diminished right vertebral artery flow void. Skull and upper cervical spine: Normal marrow signal is preserved. Sinuses/Orbits: Chronic polypoid paranasal sinus inflammatory changes. Bilateral lens replacements. Other: Sella is unremarkable.  Mastoid air cells are clear. MRA HEAD Degraded by artifact. Intracranial internal carotid arteries are patent. Middle and anterior cerebral arteries are patent. Stable stenosis at the right MCA bifurcation. Redemonstrated marked stenosis of proximal right M2 MCA branch. Stenosis of a distal left M2 MCA branch is again noted. Intracranial vertebral arteries are minimally included. Basilar artery is patent.  Posterior cerebral arteries are patent with atherosclerotic irregularity. Distal right P2 and right P2-P3 junction stenoses again noted. IMPRESSION: Small acute infarcts right frontoparietal lobes. Additional small infarct in the left gangliocapsular region. Stable chronic findings detailed above. MRA degraded by artifact. No new proximal intracranial vessel occlusion. There is similar marked stenosis of a proximal right M2 MCA branch. Electronically Signed   By: Macy Mis M.D.   On: 03/18/2021 12:29   MR BRAIN WO CONTRAST  Result Date: 03/18/2021 CLINICAL DATA:  Neuro deficit, acute, stroke suspected EXAM: MRI HEAD WITHOUT CONTRAST MRA HEAD WITHOUT CONTRAST TECHNIQUE: Multiplanar, multi-echo pulse sequences of the brain and surrounding structures were acquired without intravenous contrast. Angiographic images of the Circle of Willis were acquired using MRA technique without intravenous contrast. COMPARISON:  01/03/2021 FINDINGS: MRI HEAD Brain: Scattered small foci of reduced diffusion in the right frontoparietal lobes. Additional small focus in the left gangliocapsular region. Chronic cortical/subcortical right parietal infarct. Chronic small vessel infarcts of the deep gray nuclei and adjacent white matter, pons extending into the right brachium pontis, and corpus callosum. Additional patchy and confluent areas of T2 hyperintensity in the supratentorial and pontine white matter are nonspecific but probably reflect stable moderate chronic microvascular ischemic changes. There is no intracranial mass or mass effect. Punctate focus of susceptibility is again identified along the medial left temporal lobe likely reflecting chronic microhemorrhage. There is no hydrocephalus or extra-axial fluid collection. Vascular: Unchanged diminished right vertebral artery flow void. Skull and upper cervical spine: Normal marrow signal is preserved. Sinuses/Orbits: Chronic polypoid paranasal sinus inflammatory changes.  Bilateral lens replacements. Other: Sella is unremarkable.  Mastoid air cells are clear. MRA HEAD Degraded by artifact. Intracranial internal carotid arteries are patent. Middle and anterior cerebral arteries are patent. Stable stenosis at the right MCA bifurcation. Redemonstrated marked stenosis of proximal right M2 MCA branch. Stenosis of a distal left M2 MCA branch is again noted. Intracranial vertebral arteries are minimally included. Basilar artery is patent. Posterior cerebral arteries are patent with atherosclerotic irregularity. Distal right P2 and right P2-P3 junction stenoses again noted. IMPRESSION: Small acute infarcts right frontoparietal lobes. Additional small infarct in the left gangliocapsular  region. Stable chronic findings detailed above. MRA degraded by artifact. No new proximal intracranial vessel occlusion. There is similar marked stenosis of a proximal right M2 MCA branch. Electronically Signed   By: Macy Mis M.D.   On: 03/18/2021 12:29   CT HEAD CODE STROKE WO CONTRAST`  Result Date: 03/18/2021 CLINICAL DATA:  Code stroke. 71 year old female with left side weakness. Last seen well 0930 hours. EXAM: CT HEAD WITHOUT CONTRAST TECHNIQUE: Contiguous axial images were obtained from the base of the skull through the vertex without intravenous contrast. RADIATION DOSE REDUCTION: This exam was performed according to the departmental dose-optimization program which includes automated exposure control, adjustment of the mA and/or kV according to patient size and/or use of iterative reconstruction technique. COMPARISON:  Brain MRI 01/03/2021.  Head CT 01/31/2021. FINDINGS: Brain: Advanced chronic small vessel disease, multiple chronic lacunar infarcts in the bilateral deep gray matter nuclei and brainstem as demonstrated by MRI in December. Associated patchy asymmetric white matter hypodensity is stable and greater in the right hemisphere. Subtle associated chronic right parietal lobe cortical  encephalomalacia. No midline shift, ventriculomegaly, mass effect, evidence of mass lesion, intracranial hemorrhage or evidence of cortically based acute infarction. Vascular: Calcified atherosclerosis at the skull base. No suspicious intracranial vascular hyperdensity. Skull: No acute osseous abnormality identified. Sinuses/Orbits: Stable chronic paranasal sinusitis mostly on the right side. Tympanic cavities and mastoids remain well aerated. Other: Mild right gaze deviation. Visualized scalp soft tissues are within normal limits. ASPECTS [facility] Stroke Program Early CT Score) Total score (0-10 with 10 being normal): 10 IMPRESSION: 1. No acute cortically based infarct or acute intracranial hemorrhage identified. ASPECTS 10. 2. Advanced chronic small vessel disease appears stable from MRI in December. 3. Chronic paranasal sinusitis. Study discussed by telephone with Dr. Davonna Belling on 03/18/2021 at 11:15 . Electronically Signed   By: Genevie Ann M.D.   On: 03/18/2021 11:16    EKG: Independently reviewed. NSR no acute ST-T wave changes   Assessment/Plan Principal Problem:   Acute CVA (cerebrovascular accident) ([place]) Active Problems:   Acute kidney injury superimposed on CKD ([place])   Acute hypotension   Acute left hemiparesis (HCC)   Right Middle cerebral artery stenosis   Essential hypertension   Hyperlipidemia   Left ventricular hypertrophy   CKD (chronic kidney disease), stage IV (HCC)   Dehydration   Type 2 diabetes mellitus with hyperlipidemia (HCC)   History of renal cell carcinoma   Hyperglycemia   Assessment and Plan: * Acute CVA (cerebrovascular accident) ([place])- (present on admission) Code stroke called and teleneurology consult with Dr. Leonel Ramsay and recommendations for:  Avoid hypotension -allow permissive hypertension and holding home blood pressure medications Continue dual antiplatelet therapy with aspirin and Plavix (patient may not have been taking aspirin and seems  confused) Continue rosuvastatin 40 mg daily Continue neurochecks as ordered PT OT and ST evaluation requested Admitting to [facility] per neurology recommendation Please notify neurology team when patient [facility] at [facility]  Right Middle cerebral artery stenosis- (present on admission) Dr. Leonel Ramsay requested patient be transferred to [facility] for observation in case further therapies are needed to be considered  Acute left hemiparesis ([place])- (present on admission) Fortunately symptoms have resolved after blood pressures have improved. Continue neurochecks  Acute hypotension- (present on admission) Suspect secondary to mild dehydration associated with taking blood pressure medications Treated with IV fluids and now resolved and BP is recovering Holding home blood pressure medications at this time and allowing permissive hypertension in setting of acute  CVA  Acute kidney injury superimposed on CKD [facility])- (present on admission) Patient has stage IV CKD and followed closely by her nephrologist Dr. Rockwell Germany with [facility] nephrology in March [facility]. Suspect she is mildly dehydrated. She has a soft blood pressure and she is being given IV fluids for rehydration. Renally dose medications as appropriate. Recheck renal function in a.m.  Dehydration- (present on admission) IV fluid hydration ordered  CKD (chronic kidney disease), stage IV ([place])- (present on admission) Renally dose medications as appropriate Patient is followed closely by her nephrologist with [facility]  Left ventricular hypertrophy- (present on admission) Recent echocardiogram in December 2022 IMPRESSIONS 1. Left ventricular ejection fraction, by estimation, is 70 to 75%. The left ventricle has normal function. The left ventricle has no regional wall motion abnormalities. There is severe left ventricular hypertrophy. Left ventricular diastolic parameters are consistent with Grade I diastolic dysfunction (impaired  relaxation). Elevated left atrial pressure. 2. Right ventricular systolic function is normal. The right ventricular size is normal. 3. The mitral valve is normal in structure. No evidence of mitral valve regurgitation. No evidence of mitral stenosis. 4. The aortic valve is tricuspid. Aortic valve regurgitation is not visualized. No aortic stenosis is present. 5. The inferior vena cava is normal in size with greater than 50% respiratory variability, suggesting right atrial pressure of 3 mmHg.   Hyperlipidemia- (present on admission) Resume home rosuvastatin 40 mg every evening  Essential hypertension- (present on admission) Temporarily holding home blood pressure medications and allowing permissive hypertension  Hyperglycemia- (present on admission) See insulin orders and CBG monitoring above  Type 2 diabetes mellitus with hyperlipidemia ([place])- (present on admission) In the setting of acute kidney injury on chronic kidney disease we are reducing her home insulin dose. Added very low-dose prandial coverage Renal sensitive sliding scale coverage and frequent CBG monitoring ordered Further adjustments pending CBG data   DVT prophylaxis: enoxaparin   Code Status: Full   Family Communication: Sister sitting at bedside updated 03/18/2021 Disposition Plan: transferring to [place] called: neurology Dr. Mike Craze   Admission status: INP   Level of care: Telemetry Medical Irwin Brakeman MD [facility] How to contact the [facility] Attending or Consulting provider [place] or covering provider during after hours [place], for this patient?  Check the care team in [facility] and look for a) attending/consulting [facility] provider listed and b) the [facility] team listed Log into [email or web address] and use [place] universal password to access. If you do not have the password, please contact the hospital operator. Locate the [facility] provider you are looking for under [facility] and  page to a number that you can be directly reached. If you still have difficulty reaching the provider, please page the [facility] (Director on Call) for the Hospitalists listed on amion for assistance.   If 7PM-7AM, please contact night-coverage [email or web address] Password [email or web address]  03/18/2021, 3:07 PM

## 2021-03-18 NOTE — Assessment & Plan Note (Signed)
Dr. Leonel Ramsay requested patient be transferred to [facility] for observation in case further therapies are needed to be considered

## 2021-03-18 NOTE — Assessment & Plan Note (Signed)
Resume home rosuvastatin 40 mg every evening

## 2021-03-18 NOTE — Consult Note (Signed)
[facility] Telemedicine Consult   Requesting Provider: Robyn Haber Consult Participants: Patient, bedside nurse Location of the provider: [facility] Location of the patient: [place]  This consult was provided via telemedicine with 2-way video and audio communication. The patient/family was informed that care would be provided in this way and agreed to receive care in this manner.    Chief Complaint: Left-sided weakness  HPI: [name] is a 71 year old female with known severe intracranial atherosclerotic disease who presents with left-sided weakness that was present on awakening this morning.  She was in her normal state of health when she went to bed last night.  She was called a code stroke given some concern for LVO and on evaluation, she does have some neglect.  She was taken for a stat MRI/MRA given her renal dysfunction which did demonstrate patchy infarcts in the distribution of a known stenotic vessel.  Over the course of my evaluation, nursing notes that she is significantly better than when she was evaluated earlier.  Of note, she was significantly hypotensive on arrival with systolics in the 710.     LKW: 9:30 PM tpa given?: No, out of window IR Thrombectomy? No, no LVO Modified Rankin Scale: 1-No significant post stroke disability and can perform usual duties with stroke symptoms Time of teleneurologist evaluation: 10:58 pm  Exam: Vitals:   03/18/21 1100 03/18/21 1115  BP: (!) 108/58 (!) 99/55  Pulse: 80 79  Resp: 20 16  Temp:    SpO2: 98% 97%    General: in bed, NAD  1A: Level of Consciousness - 0 1B: Ask Month and Age - 0 1C: 'Blink Eyes' & 'Squeeze Hands' - 0 2: Test Horizontal Extraocular Movements - 1 3: Test Visual Fields - 0 4: Test Facial Palsy - 2 5A: Test Left Arm Motor Drift - 1 5B: Test Right Arm Motor Drift - 0 6A: Test Left Leg Motor Drift - 1 6B: Test Right Leg Motor Drift - 0 7: Test Limb  Ataxia - 0 8: Test Sensation - 1 9: Test Language/Aphasia- 0 10: Test Dysarthria - 0 11: Test Extinction/Inattention - 1 NIHSS score: 7   Imaging Reviewed: CT head - negative MRI - patchy infarct in an M2 distribution  Labs reviewed in epic and pertinent values follow: Creatinine 3.6   Assessment: 71 year old female who presents with focal deficits in setting of severe stenosis with hypotension.  My suspicion is that this represents hypoperfusion and as her blood pressure is increased, her symptoms are improving.  I would continue to maintain a systolic BP of at least 037, but would favor greater than 130 if feasible.  She is on maximal medical therapy at this point, and continuing to have problems, though first we have addressing this should be maintaining blood pressures, may need to consider other more interventional options if she continues to have problems.  I would favor her being transferred to [facility] for observation in case further therapies need to be considered.  Recommendations:  1) avoid hypotension 2) continue dual antiplatelet therapy with aspirin and Plavix 3) continue rosuvastatin 40 mg daily 4) continue to monitor neurological status  5) PT, OT, ST    Roland Rack, MD [facility] [phone]  If 7pm- 7am, please page neurology on call as listed in [place].

## 2021-03-18 NOTE — Assessment & Plan Note (Signed)
See insulin orders and CBG monitoring above

## 2021-03-18 NOTE — Assessment & Plan Note (Signed)
IV fluid hydration ordered

## 2021-03-18 NOTE — Assessment & Plan Note (Signed)
Temporarily holding home blood pressure medications and allowing permissive hypertension

## 2021-03-18 NOTE — Assessment & Plan Note (Signed)
Recent echocardiogram in December 2022 IMPRESSIONS 1. Left ventricular ejection fraction, by estimation, is 70 to 75%. The left ventricle has normal function. The left ventricle has no regional wall motion abnormalities. There is severe left ventricular hypertrophy. Left ventricular diastolic parameters are consistent with Grade I diastolic dysfunction (impaired relaxation). Elevated left atrial pressure. 2. Right ventricular systolic function is normal. The right ventricular size is normal. 3. The mitral valve is normal in structure. No evidence of mitral valve regurgitation. No evidence of mitral stenosis. 4. The aortic valve is tricuspid. Aortic valve regurgitation is not visualized. No aortic stenosis is present. 5. The inferior vena cava is normal in size with greater than 50% respiratory variability, suggesting right atrial pressure of 3 mmHg.

## 2021-03-18 NOTE — Assessment & Plan Note (Signed)
In the setting of acute kidney injury on chronic kidney disease we are reducing her home insulin dose. Added very low-dose prandial coverage Renal sensitive sliding scale coverage and frequent CBG monitoring ordered Further adjustments pending CBG data

## 2021-03-18 NOTE — ED Triage Notes (Addendum)
Pt presents to ED with complaints of slurred speech, left arm flaccid, and left leg weakness since waking up at 0700. LKW 2130

## 2021-03-19 DIAGNOSIS — Z85528 Personal history of other malignant neoplasm of kidney: Secondary | ICD-10-CM

## 2021-03-19 DIAGNOSIS — E1165 Type 2 diabetes mellitus with hyperglycemia: Secondary | ICD-10-CM | POA: Diagnosis not present

## 2021-03-19 DIAGNOSIS — E1169 Type 2 diabetes mellitus with other specified complication: Secondary | ICD-10-CM

## 2021-03-19 DIAGNOSIS — I6601 Occlusion and stenosis of right middle cerebral artery: Secondary | ICD-10-CM

## 2021-03-19 DIAGNOSIS — E11649 Type 2 diabetes mellitus with hypoglycemia without coma: Secondary | ICD-10-CM

## 2021-03-19 DIAGNOSIS — E785 Hyperlipidemia, unspecified: Secondary | ICD-10-CM

## 2021-03-19 LAB — CBC
HCT: 34.3 % — ABNORMAL LOW (ref 36.0–46.0)
Hemoglobin: 10.7 g/dL — ABNORMAL LOW (ref 12.0–15.0)
MCH: 28 pg (ref 26.0–34.0)
MCHC: 31.2 g/dL (ref 30.0–36.0)
MCV: 89.8 fL (ref 80.0–100.0)
Platelets: 196 10*3/uL (ref 150–400)
RBC: 3.82 MIL/uL — ABNORMAL LOW (ref 3.87–5.11)
RDW: 13.9 % (ref 11.5–15.5)
WBC: 5.7 10*3/uL (ref 4.0–10.5)
nRBC: 0 % (ref 0.0–0.2)

## 2021-03-19 LAB — COMPREHENSIVE METABOLIC PANEL
ALT: 40 U/L (ref 0–44)
AST: 34 U/L (ref 15–41)
Albumin: 3.1 g/dL — ABNORMAL LOW (ref 3.5–5.0)
Alkaline Phosphatase: 82 U/L (ref 38–126)
Anion gap: 9 (ref 5–15)
BUN: 55 mg/dL — ABNORMAL HIGH (ref 8–23)
CO2: 19 mmol/L — ABNORMAL LOW (ref 22–32)
Calcium: 8.7 mg/dL — ABNORMAL LOW (ref 8.9–10.3)
Chloride: 107 mmol/L (ref 98–111)
Creatinine, Ser: 2.52 mg/dL — ABNORMAL HIGH (ref 0.44–1.00)
GFR, Estimated: 20 mL/min — ABNORMAL LOW (ref >60)
Glucose, Bld: 114 mg/dL — ABNORMAL HIGH (ref 70–99)
Potassium: 3.9 mmol/L (ref 3.5–5.1)
Sodium: 135 mmol/L (ref 135–145)
Total Bilirubin: 0.2 mg/dL — ABNORMAL LOW (ref 0.3–1.2)
Total Protein: 6.6 g/dL (ref 6.5–8.1)

## 2021-03-19 LAB — GLUCOSE, CAPILLARY
Glucose-Capillary: 104 mg/dL — ABNORMAL HIGH (ref 70–99)
Glucose-Capillary: 83 mg/dL (ref 70–99)
Glucose-Capillary: 96 mg/dL (ref 70–99)
Glucose-Capillary: 175 mg/dL — ABNORMAL HIGH (ref 70–99)
Glucose-Capillary: 136 mg/dL — ABNORMAL HIGH (ref 70–99)
Glucose-Capillary: 122 mg/dL — ABNORMAL HIGH (ref 70–99)

## 2021-03-19 LAB — LIPID PANEL
Cholesterol: 88 mg/dL (ref 0–200)
HDL: 38 mg/dL — ABNORMAL LOW (ref >40)
LDL Cholesterol: 23 mg/dL (ref 0–99)
Total CHOL/HDL Ratio: 2.3 RATIO
Triglycerides: 137 mg/dL (ref <150)
VLDL: 27 mg/dL (ref 0–40)

## 2021-03-19 MED ORDER — ROSUVASTATIN CALCIUM 20 MG PO TABS
20.0000 mg | ORAL_TABLET | Freq: Every evening | ORAL | Status: DC
  Administered 2021-03-19 – 2021-03-20 (×2): 20 mg via ORAL
  Filled 2021-03-19 (×2): qty 1

## 2021-03-19 MED ORDER — VITAMIN B-12 1000 MCG PO TABS
1000.0000 ug | ORAL_TABLET | Freq: Every day | ORAL | Status: DC
  Administered 2021-03-19 – 2021-03-21 (×3): 1000 ug via ORAL
  Filled 2021-03-19 (×3): qty 1

## 2021-03-19 MED ORDER — TICAGRELOR 90 MG PO TABS
90.0000 mg | ORAL_TABLET | Freq: Two times a day (BID) | ORAL | Status: DC
  Administered 2021-03-20 – 2021-03-21 (×3): 90 mg via ORAL
  Filled 2021-03-19 (×3): qty 1

## 2021-03-19 NOTE — Progress Notes (Signed)
Inpatient Rehab Admissions Coordinator:   Per OT and SLP recommendations,  patient was screened for CIR candidacy by Clemens Catholic, MS, CCC-SLP. At this time, Pt. Appears to be a a potential candidate for CIR. I will place  order for rehab consult per protocol for full assessment. Please contact me any with questions.  Clemens Catholic, [place], [place] Admissions Coordinator  [phone] ([place]) [phone] (office)

## 2021-03-19 NOTE — Evaluation (Signed)
Physical Therapy Evaluation Patient Details Name: Briana Garrison MRN: [id number] DOB: 1950-06-24 Today's Date: 03/19/2021  History of Present Illness  Briana Garrison is a 71 y/o who presented to ED after waking up wtih slurred speech, L weakness and a fall out of bed.  MRI showed findings of acute CVA in the right frontoparietal lobes. Pt with history of prior strokes (followed by Dr. Leonie Man), had been on aspirin and plavix (pt unsure if she was taking both for my history), type 2 diabetes mellitus on insulin, hyperlipidemia, chronic renal insufficiency stage IV (followed by [facility] Citizen), hypertension, microalbuminuria, LVH, history of renal carcinoma, normocytic anemia   Clinical Impression  Pt admitted with above. Pt with noted both functional and cognitive deficits. Pt with L sided weakness, impaired processing, memory deficits, and impaired balance requiring RW for safe ambulation at this time. Pt was indep with SPC and driving PTA. Pt to greatly benefit from AIR upon d/c to maximize functional return for safe transition home with family.       Recommendations for follow up therapy are one component of a multi-disciplinary discharge planning process, led by the attending physician.  Recommendations may be updated based on patient status, additional functional criteria and insurance authorization.  Follow Up Recommendations Acute inpatient rehab (3hours/day)    Assistance Recommended at Discharge Frequent or constant Supervision/Assistance  Patient can return home with the following  A little help with walking and/or transfers;A little help with bathing/dressing/bathroom;Assistance with cooking/housework;Direct supervision/assist for medications management;Direct supervision/assist for financial management;Assist for transportation;Help with stairs or ramp for entrance    Equipment Recommendations  (has needed equip)  Recommendations for Other Services  Rehab consult    Functional Status  Assessment Patient has had a recent decline in their functional status and demonstrates the ability to make significant improvements in function in a reasonable and predictable amount of time.     Precautions / Restrictions Precautions Precautions: Fall Restrictions Weight Bearing Restrictions: No      Mobility  Bed Mobility Overal bed mobility: Needs Assistance Bed Mobility: Supine to Sit     Supine to sit: Supervision     General bed mobility comments: incr time, able to bring LEs off EOB, used bed rail, HOB elevated    Transfers Overall transfer level: Needs assistance Equipment used: Rolling walker (2 wheels) Transfers: Sit to/from Stand Sit to Stand: Min assist           General transfer comment: cues for handplacement with each transfer - no carryover noted    Ambulation/Gait Ambulation/Gait assistance: Min assist Gait Distance (Feet): 60 Feet Assistive device: Rolling walker (2 wheels) Gait Pattern/deviations: Step-through pattern, Decreased stride length, Decreased dorsiflexion - left Gait velocity: slow Gait velocity interpretation: <1.31 ft/sec, indicative of household ambulator   General Gait Details: minA for walker management, vearing to the L, no L knee buckling  Stairs            Wheelchair Mobility    Modified Rankin (Stroke Patients Only) Modified Rankin (Stroke Patients Only) Pre-Morbid Rankin Score: Moderate disability Modified Rankin: Moderately severe disability     Balance Overall balance assessment: Needs assistance, History of Falls Sitting-balance support: Feet supported Sitting balance-Leahy Scale: Good     Standing balance support: During functional activity, Bilateral upper extremity supported Standing balance-Leahy Scale: Fair Standing balance comment: requires RW for safe ambulation  Pertinent Vitals/Pain Pain Assessment Pain Assessment: No/denies pain    Home Living  Family/patient expects to be discharged to:: Private residence Living Arrangements: Spouse/significant other;Children Available Help at Discharge: Family;Available 24 hours/day Type of Home: House Home Access: Stairs to enter Entrance Stairs-Rails: None Entrance Stairs-Number of Steps: 2   Home Layout: One level Home Equipment: Shower seat;Cane - single point;Rolling Walker (2 wheels);Hand held shower head      Prior Function Prior Level of Function : Needs assist             Mobility Comments: using cane, drove ADLs Comments: indep, dtr does manage medications     Hand Dominance   Dominant Hand: Right    Extremity/Trunk Assessment   Upper Extremity Assessment Upper Extremity Assessment: Defer to OT evaluation LUE Deficits / Details: strength generally 4+/5, full ROM. impaired coordination. denies paresthesia LUE Sensation: WNL LUE Coordination: decreased fine motor;decreased gross motor    Lower Extremity Assessment Lower Extremity Assessment: LLE deficits/detail LLE Deficits / Details: grossly 4-/5 LLE Sensation: decreased light touch LLE Coordination: decreased gross motor    Cervical / Trunk Assessment Cervical / Trunk Assessment: Normal  Communication   Communication: Expressive difficulties  Cognition Arousal/Alertness: Awake/alert Behavior During Therapy: WFL for tasks assessed/performed Overall Cognitive Status: Impaired/Different from baseline Area of Impairment: Attention, Memory, Following commands, Safety/judgement, Awareness, Problem solving, Orientation                 Orientation Level: Disoriented to, Time Current Attention Level: Sustained Memory: Decreased short-term memory Following Commands: Follows one step commands with increased time Safety/Judgement: Decreased awareness of safety Awareness: Emergent Problem Solving: Slow processing, Requires verbal cues General Comments: Per SLP, pt scored 18/30 on SLUMS which is indicative of  cog deficit. Pt was unable to recall date despite cues, & impaired delayed recall. She has poor insight to safety/deficits and difficulty with multi-step commands. Some conflicting information reports throughout in regard to PLOF, home set up and support available        General Comments General comments (skin integrity, edema, etc.): VSS on RA, sister present    Exercises     Assessment/Plan    PT Assessment Patient needs continued PT services  PT Problem List Decreased strength;Decreased range of motion;Decreased activity tolerance;Decreased balance;Decreased mobility;Decreased coordination;Decreased cognition;Decreased knowledge of use of DME;Decreased safety awareness       PT Treatment Interventions DME instruction;Gait training;Stair training;Functional mobility training;Therapeutic activities;Therapeutic exercise;Balance training    PT Goals (Current goals can be found in the Care Plan section)  Acute Rehab PT Goals Patient Stated Goal: to get better PT Goal Formulation: With patient Time For Goal Achievement: 04/02/21 Potential to Achieve Goals: Good    Frequency Min 4X/week     Co-evaluation PT/OT/SLP Co-Evaluation/Treatment: Yes Reason for Co-Treatment: To address functional/ADL transfers PT goals addressed during session: Mobility/safety with mobility OT goals addressed during session: ADL's and self-care       AM-PAC PT "6 Clicks" Mobility  Outcome Measure                  End of Session Equipment Utilized During Treatment: Gait belt Activity Tolerance: Patient tolerated treatment well Patient left: in chair;with call bell/phone within reach;with chair alarm set;with family/visitor present Nurse Communication: Mobility status PT Visit Diagnosis: Unsteadiness on feet (R26.81);Muscle weakness (generalized) (M62.81);Difficulty in walking, not elsewhere classified (R26.2)    Time: 1025-8527 PT Time Calculation (min) (ACUTE ONLY): 31 min   Charges:    PT Evaluation $PT Eval Moderate Complexity:  [place], PT, DPT Acute Rehabilitation Services Pager #: [phone] Office #: [phone]   Berline Lopes 03/19/2021, 2:43 PM

## 2021-03-19 NOTE — Progress Notes (Addendum)
STROKE TEAM PROGRESS NOTE   INTERVAL HISTORY Her sisters are at the bedside.  Patient reports that she had the onset of LUE and LLE>RLE weakness, which prompted her to visit [facility]. She was found to be hypotensive, and as her BP improved, her sx subsided. Patient reports poor hydration at home. She denies facial droop, speech changes, or vision changes. As well, she denies numbness and tingling. She reports resolution of sx today.   CTH with no acute changes, chronic small vessel disease, and chronic paranasal sinusitis. MRI with acute CVA in the right frontoparietal lobes.  Vitals:   03/18/21 2207 03/19/21 0009 03/19/21 0309 03/19/21 0749  BP: (!) 156/73 (!) 155/74 (!) 146/64 (!) 143/73  Pulse: 78 74 71 71  Resp: 18 18 18 18   Temp: 98 F (36.7 C) (!) 97.5 F (36.4 C) (!) 97.3 F (36.3 C) 98.1 F (36.7 C)  TempSrc: Oral Oral Oral Oral  SpO2: 99% 99% 100% 99%  Weight:      Height:       CBC:  Recent Labs  Lab 03/18/21 1051 03/19/21 0145  WBC 5.0 5.7  NEUTROABS 2.8  --   HGB 11.0*   12.2 10.7*  HCT 34.8*   36.0 34.3*  MCV 92.1 89.8  PLT 209 967   Basic Metabolic Panel:  Recent Labs  Lab 03/18/21 1051 03/19/21 0145  NA 132*   135 135  K 4.3   4.4 3.9  CL 100   103 107  CO2 20* 19*  GLUCOSE 223*   217* 114*  BUN 68*   68* 55*  CREATININE 3.22*   3.60* 2.52*  CALCIUM 8.9 8.7*   Lipid Panel:  Recent Labs  Lab 03/19/21 0145  CHOL 88  TRIG 137  HDL 38*  CHOLHDL 2.3  VLDL 27  LDLCALC 23   HgbA1c:  Recent Labs  Lab 03/18/21 1051  HGBA1C 11.7*   Urine Drug Screen: No results for input(s): LABOPIA, COCAINSCRNUR, LABBENZ, AMPHETMU, THCU, LABBARB in the last 168 hours.  Alcohol Level  Recent Labs  Lab 03/18/21 1051  ETH <10    IMAGING past 24 hours MR ANGIO HEAD WO CONTRAST  Result Date: 03/18/2021 CLINICAL DATA:  Neuro deficit, acute, stroke suspected EXAM: MRI HEAD WITHOUT CONTRAST MRA HEAD WITHOUT CONTRAST TECHNIQUE: Multiplanar, multi-echo pulse  sequences of the brain and surrounding structures were acquired without intravenous contrast. Angiographic images of the Circle of Willis were acquired using MRA technique without intravenous contrast. COMPARISON:  01/03/2021 FINDINGS: MRI HEAD Brain: Scattered small foci of reduced diffusion in the right frontoparietal lobes. Additional small focus in the left gangliocapsular region. Chronic cortical/subcortical right parietal infarct. Chronic small vessel infarcts of the deep gray nuclei and adjacent white matter, pons extending into the right brachium pontis, and corpus callosum. Additional patchy and confluent areas of T2 hyperintensity in the supratentorial and pontine white matter are nonspecific but probably reflect stable moderate chronic microvascular ischemic changes. There is no intracranial mass or mass effect. Punctate focus of susceptibility is again identified along the medial left temporal lobe likely reflecting chronic microhemorrhage. There is no hydrocephalus or extra-axial fluid collection. Vascular: Unchanged diminished right vertebral artery flow void. Skull and upper cervical spine: Normal marrow signal is preserved. Sinuses/Orbits: Chronic polypoid paranasal sinus inflammatory changes. Bilateral lens replacements. Other: Sella is unremarkable.  Mastoid air cells are clear. MRA HEAD Degraded by artifact. Intracranial internal carotid arteries are patent. Middle and anterior cerebral arteries are patent. Stable stenosis at the right MCA  bifurcation. Redemonstrated marked stenosis of proximal right M2 MCA branch. Stenosis of a distal left M2 MCA branch is again noted. Intracranial vertebral arteries are minimally included. Basilar artery is patent. Posterior cerebral arteries are patent with atherosclerotic irregularity. Distal right P2 and right P2-P3 junction stenoses again noted. IMPRESSION: Small acute infarcts right frontoparietal lobes. Additional small infarct in the left gangliocapsular  region. Stable chronic findings detailed above. MRA degraded by artifact. No new proximal intracranial vessel occlusion. There is similar marked stenosis of a proximal right M2 MCA branch. Electronically Signed   By: Macy Mis M.D.   On: 03/18/2021 12:29   MR BRAIN WO CONTRAST  Result Date: 03/18/2021 CLINICAL DATA:  Neuro deficit, acute, stroke suspected EXAM: MRI HEAD WITHOUT CONTRAST MRA HEAD WITHOUT CONTRAST TECHNIQUE: Multiplanar, multi-echo pulse sequences of the brain and surrounding structures were acquired without intravenous contrast. Angiographic images of the Circle of Willis were acquired using MRA technique without intravenous contrast. COMPARISON:  01/03/2021 FINDINGS: MRI HEAD Brain: Scattered small foci of reduced diffusion in the right frontoparietal lobes. Additional small focus in the left gangliocapsular region. Chronic cortical/subcortical right parietal infarct. Chronic small vessel infarcts of the deep gray nuclei and adjacent white matter, pons extending into the right brachium pontis, and corpus callosum. Additional patchy and confluent areas of T2 hyperintensity in the supratentorial and pontine white matter are nonspecific but probably reflect stable moderate chronic microvascular ischemic changes. There is no intracranial mass or mass effect. Punctate focus of susceptibility is again identified along the medial left temporal lobe likely reflecting chronic microhemorrhage. There is no hydrocephalus or extra-axial fluid collection. Vascular: Unchanged diminished right vertebral artery flow void. Skull and upper cervical spine: Normal marrow signal is preserved. Sinuses/Orbits: Chronic polypoid paranasal sinus inflammatory changes. Bilateral lens replacements. Other: Sella is unremarkable.  Mastoid air cells are clear. MRA HEAD Degraded by artifact. Intracranial internal carotid arteries are patent. Middle and anterior cerebral arteries are patent. Stable stenosis at the right MCA  bifurcation. Redemonstrated marked stenosis of proximal right M2 MCA branch. Stenosis of a distal left M2 MCA branch is again noted. Intracranial vertebral arteries are minimally included. Basilar artery is patent. Posterior cerebral arteries are patent with atherosclerotic irregularity. Distal right P2 and right P2-P3 junction stenoses again noted. IMPRESSION: Small acute infarcts right frontoparietal lobes. Additional small infarct in the left gangliocapsular region. Stable chronic findings detailed above. MRA degraded by artifact. No new proximal intracranial vessel occlusion. There is similar marked stenosis of a proximal right M2 MCA branch. Electronically Signed   By: Macy Mis M.D.   On: 03/18/2021 12:29   CT HEAD CODE STROKE WO CONTRAST`  Result Date: 03/18/2021 CLINICAL DATA:  Code stroke. 71 year old female with left side weakness. Last seen well 0930 hours. EXAM: CT HEAD WITHOUT CONTRAST TECHNIQUE: Contiguous axial images were obtained from the base of the skull through the vertex without intravenous contrast. RADIATION DOSE REDUCTION: This exam was performed according to the departmental dose-optimization program which includes automated exposure control, adjustment of the mA and/or kV according to patient size and/or use of iterative reconstruction technique. COMPARISON:  Brain MRI 01/03/2021.  Head CT 01/31/2021. FINDINGS: Brain: Advanced chronic small vessel disease, multiple chronic lacunar infarcts in the bilateral deep gray matter nuclei and brainstem as demonstrated by MRI in December. Associated patchy asymmetric white matter hypodensity is stable and greater in the right hemisphere. Subtle associated chronic right parietal lobe cortical encephalomalacia. No midline shift, ventriculomegaly, mass effect, evidence of mass lesion, intracranial hemorrhage or  evidence of cortically based acute infarction. Vascular: Calcified atherosclerosis at the skull base. No suspicious intracranial  vascular hyperdensity. Skull: No acute osseous abnormality identified. Sinuses/Orbits: Stable chronic paranasal sinusitis mostly on the right side. Tympanic cavities and mastoids remain well aerated. Other: Mild right gaze deviation. Visualized scalp soft tissues are within normal limits. ASPECTS [facility] Stroke Program Early CT Score) Total score (0-10 with 10 being normal): 10 IMPRESSION: 1. No acute cortically based infarct or acute intracranial hemorrhage identified. ASPECTS 10. 2. Advanced chronic small vessel disease appears stable from MRI in December. 3. Chronic paranasal sinusitis. Study discussed by telephone with Dr. Davonna Belling on 03/18/2021 at 11:15 . Electronically Signed   By: Genevie Ann M.D.   On: 03/18/2021 11:16    PHYSICAL EXAM General - Well nourished, well developed, age congruent woman in no apparent distress.  Cardiovascular - Regular rhythm and rate on telemetry.  Mental Status -  Level of arousal and orientation to time, place, and person were intact. Language including expression, naming, repetition, comprehension was assessed and found intact. Attention span and concentration were normal. Recent and remote memory were intact. Fund of Knowledge was assessed and was intact.  Cranial Nerves II - XII - II - Visual field intact OU. III, IV, VI - Extraocular movements intact. V - Facial sensation with reportedly decreased R V1 sensation compared to L V1; otherwise symmetric VII - Facial movement intact bilaterally. VIII - Hearing & vestibular intact bilaterally. X - Palate elevates symmetrically. XI - Shoulder shrug intact bilaterally. XII - Tongue protrusion intact.  Motor Strength - The patients strength was normal in all extremities and pronator drift was absent.  Bulk was normal and fasciculations were absent.   Motor Tone - Muscle tone was assessed at the neck and appendages and was normal.  Sensory - Light touch was assessed and symmetrical.    Coordination -  The patient had normal movements in the hands and feet with no ataxia or dysmetria.  Tremor was absent.  Gait and Station - deferred.  ASSESSMENT/PLAN Briana Garrison is a 71 y.o. female with history of prior strokes, severe intracranial atherosclerotic disease, DM2, HLD, CKD stage IV, HTN, microalbuminuria, LVH, renal carcinoma, normocytic anemia presenting with slurred speech, left side weakness that resulted in her falling out of bed. Previous CVA had minimal left side residual.    Stroke:  right MCA scattered small infarcts and left BG punctate infarct likely due to large vessel intracranial stenosis in the setting of hypotension Code Stroke CT head No acute abnormality. Small vessel disease. Chronic paranasal sinusitis. ASPECTS 10.    MRI  acute CVA in the right frontoparietal lobes and left BG punctate MRA  marked stenosis of proximal R M2 MCA. 2D Echo 01/04/21: LVEF 70-75%; severe LVH, Grade I diastolic dysfunction; tricuspid aortic valve. LDL 23 HgbA1c 11.7 VTE prophylaxis - SQ Lovenox aspirin 81 mg daily and clopidogrel 75 mg daily prior to admission, now on aspirin 81 mg daily and Brilinta (ticagrelor) 90 mg bid x 30 days then resume ASA/Plavix Therapy recommendations:  AIR per OT, pending PT Disposition:  Pending  Hypertension Home meds:  Coreg 25 mg BID, Clonidine 0.1 mg 1.5 tablets BID, Telmisartan 20 mg Stable Avoid low BP Long-term BP goal 130-150 given severe intracranial stenosis  Hyperlipidemia Home meds:  Rosuvastatin 40 mg, decreased at [facility] LDL 23, goal < 70 Decreased Rosuvastatin to 20 mg, given low LDL level Continue statin at discharge  Diabetes type II Uncontrolled Home meds:  Lantus 14 u BID and 18u AM, 6u qHS, Ozempic 0.5 mg q Tuesday, and Novolog 7-13u mealtime SSI HgbA1c 11.7, goal < 7.0 CBGs Semglee 12 u qHS, Novolog 4u TID with meals, and SSI  Other Stroke Risk Factors Advanced Age >/= 20  Obesity, Body mass index is 35.22 kg/m., BMI >/= 30  associated with increased stroke risk, recommend weight loss, diet and exercise as appropriate  Hx stroke/TIA Family hx stroke (Both parents, sister, 2 brothers)  Other Active Problems AKI on CKD, likely due to dehydration - baseline creatinine 2.2 Creatinine 3.60 on admission, decreased to 2.52 today  Hospital day # 1  Rosezetta Schlatter, MD PGY-1 Neuro Psych Resident- Stroke Neurology 03/19/2021  2:42 PM   ATTENDING NOTE: I reviewed above note and agree with the assessment and plan. Pt was seen and examined.   71 year old female with history of diabetes, hypertension, hyperlipidemia, renal cell carcinoma and CKD, recurrent strokes admitted for left-sided weakness, left-sided neglect in the setting of low BP 90s.  Symptoms improved after BP improves.  She was admitted in 05/2019 for left-sided numbness, MRI showed right thalamic infarct.  MRA showed right siphon, right M1/M2 stenosis.  Carotid Doppler negative.  LDL 169, A1c 9.1, discharged on DAPT and Lipitor 80.  Admitted again in 11/2019 with 2 episodes of slurred speech and facial droop.  MRI showed patchy right parietal occipital infarcts, old bilateral BG, thalamic and pontine infarcts.  MRA head right M1, left ACA and PCA stenosis, MRA neck and carotid Doppler unremarkable.  EF 60 to 65%.  LDL 49, A1c 8.4.  Discharged on DAPT for 3 months and Crestor 40.  In 12/2019, admitted for right facial droop.  MRI no acute infarct.  MRA similar to above, EF 70 to 75%, A1c 10.4, LDL 16, discharged on DAPT with aspirin 325 and Plavix 75 for 3 months.  On this admission, CT no acute abnormality.  MRI showed right frontal parietal scattered infarcts as well as left punctate BG infarct.  MRA head and neck showed right M2 severe stenosis.  LDL 23, A1c 11.7.  Creatinine 3.6 from baseline 2.2.  Concerning for dehydration causing AKI.  Currently on exam, patient sister are at bedside, patient awake alert, fully orientated, no aphasia, follows simple  commands.  No significant neurodeficit on exam.  Etiology for patient stroke likely due to multifocal intracranial stenosis in the setting of hypotension due to dehydration and AKI.  Recommend to avoid low BP and adequate p.o. intake.  Recommend aspirin 81 and Brilinta 90 twice daily for 30 days and then back to aspirin and Plavix.  Crestor decreased from 40-20 given low level of LDL.  Aggressive stroke risk factor modification especially better DM control.  PT/OT recommend CIR.  Continue follow-up with Dr. Leonie Man at [facility].  For detailed assessment and plan, please refer to above as I have made changes wherever appropriate.   Neurology will sign off. Please call with questions. Pt will follow up with stroke clinic Dr. Leonie Man at [facility] in about 4 weeks. Thanks for the consult.   Rosalin Hawking, MD PhD Stroke Neurology 03/19/2021 4:28 PM    To contact Stroke Continuity provider, please refer to [email or web address] After hours, contact General Neurology

## 2021-03-19 NOTE — Evaluation (Signed)
Occupational Therapy Evaluation Patient Details Name: Briana Garrison MRN: [id number] DOB: 23-Jul-1950 Today's Date: 03/19/2021   History of Present Illness Briana Garrison is a 71 y/o who presented to ED after waking up wtih slurred speech, L weakness and a fall out of bed.  MRI showed findings of acute CVA in the right frontoparietal lobes. Pt with history of prior strokes (followed by Dr. Leonie Man), had been on aspirin and plavix (pt unsure if she was taking both for my history), type 2 diabetes mellitus on insulin, hyperlipidemia, chronic renal insufficiency stage IV (followed by [facility]), hypertension, microalbuminuria, LVH, history of renal carcinoma, normocytic anemia   Clinical Impression   Pt was evaluated s/p the above CVA, PTA she was generally mod I with use of SPC including driving, grocery shopping and housework. She lives at home with her husband and daughter who work opposite schedules, but the pt is at home alone a few hours per day mon-fri. Pt working on talking with family to cover the gap. Overall she was set up for all upper ADLs when sitting and close min guard for OOB functional tasks with RW. She has poor insight to safety with limited carry over of verbal cues, and unsteady due to L weakness/poor coordination. Seemingly impaired cognition and will benefit from further testing. Pt will benefit from therapies at the CIR to progress towards her mod I baseline prior to going home. OT to follow acutely to progress the limitations below.      Recommendations for follow up therapy are one component of a multi-disciplinary discharge planning process, led by the attending physician.  Recommendations may be updated based on patient status, additional functional criteria and insurance authorization.   Follow Up Recommendations  Acute inpatient rehab (3hours/day)    Assistance Recommended at Discharge Frequent or constant Supervision/Assistance  Patient can return home with the  following A little help with walking and/or transfers;A little help with bathing/dressing/bathroom;Assistance with cooking/housework;Assist for transportation;Direct supervision/assist for medications management    Functional Status Assessment  Patient has had a recent decline in their functional status and demonstrates the ability to make significant improvements in function in a reasonable and predictable amount of time.  Equipment Recommendations  None recommended by OT    Recommendations for Other Services Rehab consult     Precautions / Restrictions Precautions Precautions: Fall Restrictions Weight Bearing Restrictions: No      Mobility Bed Mobility Overal bed mobility: Needs Assistance Bed Mobility: Supine to Sit     Supine to sit: Supervision     General bed mobility comments: incr time    Transfers Overall transfer level: Needs assistance Equipment used: Rolling walker (2 wheels) Transfers: Sit to/from Stand Sit to Stand: Min guard           General transfer comment: cues for handplacement with each transfer - no carryover noted      Balance Overall balance assessment: Needs assistance, History of Falls Sitting-balance support: Feet supported Sitting balance-Leahy Scale: Good     Standing balance support: Single extremity supported, During functional activity Standing balance-Leahy Scale: Fair                             ADL either performed or assessed with clinical judgement   ADL Overall ADL's : Needs assistance/impaired Eating/Feeding: Independent;Sitting Eating/Feeding Details (indicate cue type and reason): incr time for bimanual tasks Grooming: Min guard;Standing Grooming Details (indicate cue type and reason): at the  sink Upper Body Bathing: Set up;Sitting   Lower Body Bathing: Min guard;Sit to/from stand Lower Body Bathing Details (indicate cue type and reason): pt washed legs while standing at the sink, close min  guard Upper Body Dressing : Set up;Sitting   Lower Body Dressing: Minimal assistance;Sit to/from stand   Toilet Transfer: Min guard;Ambulation;BSC/3in1   Toileting- Clothing Manipulation and Hygiene: Supervision/safety;Sitting/lateral lean       Functional mobility during ADLs: Min guard;Rolling walker (2 wheels) General ADL Comments: limited by cog, poor insight to safety, mildly unsteady, poor activity tolerance     Vision Baseline Vision/History: 1 Wears glasses Ability to See in Adequate Light: 0 Adequate Patient Visual Report: No change from baseline Vision Assessment?: No apparent visual deficits      Pertinent Vitals/Pain Pain Assessment Pain Assessment: No/denies pain     Hand Dominance Right   Extremity/Trunk Assessment Upper Extremity Assessment Upper Extremity Assessment: LUE deficits/detail LUE Deficits / Details: strength generally 4+/5, full ROM. impaired coordination. denies paresthesia LUE Sensation: WNL LUE Coordination: decreased fine motor;decreased gross motor   Lower Extremity Assessment Lower Extremity Assessment: Defer to PT evaluation   Cervical / Trunk Assessment Cervical / Trunk Assessment: Normal   Communication Communication Communication: Expressive difficulties   Cognition Arousal/Alertness: Awake/alert Behavior During Therapy: WFL for tasks assessed/performed Overall Cognitive Status: Impaired/Different from baseline Area of Impairment: Attention, Memory, Following commands, Safety/judgement, Awareness, Problem solving, Orientation                 Orientation Level: Disoriented to, Time Current Attention Level: Sustained Memory: Decreased short-term memory Following Commands: Follows one step commands with increased time Safety/Judgement: Decreased awareness of safety Awareness: Emergent Problem Solving: Slow processing, Requires verbal cues General Comments: Per SLP, pt scored 18/30 on SLUMS which is indicative of cog  deficit. Pt was unable to recall date despite cues, & impaired delayed recall. She has poor insight to safety/deficits and difficulty with multi-step commands. Some conflicting information reports throughout in regard to PLOF, home set up and support available     General Comments  VSS on RA, sister present            Home Living Family/patient expects to be discharged to:: Private residence Living Arrangements: Spouse/significant other;Children Available Help at Discharge: Family;Available 24 hours/day Type of Home: House Home Access: Stairs to enter [employer] of Steps: 2 Entrance Stairs-Rails: None Home Layout: One level     Bathroom Shower/Tub: Teacher, early years/pre: Handicapped height Bathroom Accessibility: Yes   Home Equipment: Shower seat;Cane - single point;Rolling Environmental consultant (2 wheels);Hand held shower head      Lives With: Spouse;Daughter    Prior Functioning/Environment Prior Level of Function : Needs assist             Mobility Comments: using cane, drove ADLs Comments: indep, dtr does manage medications        OT Problem List: Decreased strength;Decreased range of motion;Decreased activity tolerance;Impaired balance (sitting and/or standing);Decreased safety awareness;Decreased knowledge of use of DME or AE;Decreased knowledge of precautions;Impaired UE functional use      OT Treatment/Interventions: Self-care/ADL training;Therapeutic exercise;DME and/or AE instruction;Therapeutic activities;Patient/family education;Balance training    OT Goals(Current goals can be found in the care plan section) Acute Rehab OT Goals Patient Stated Goal: home OT Goal Formulation: With patient Time For Goal Achievement: 04/02/21 Potential to Achieve Goals: Good ADL Goals Pt Will Transfer to Toilet: with modified independence;ambulating Pt Will Perform Tub/Shower Transfer: with modified independence;Tub transfer Additional ADL Goal #  1: Pt will  indep recall at least 3 fall prevention strategies to apply to the home setting Additional ADL Goal #2: Pt will indep recall BE FAST stroke education to promte safe transition at d/c  OT Frequency: Min 2X/week    Co-evaluation PT/OT/SLP Co-Evaluation/Treatment: Yes Reason for Co-Treatment: For patient/therapist safety   OT goals addressed during session: ADL's and self-care      AM-PAC OT "6 Clicks" Daily Activity     Outcome Measure Help from another person eating meals?: None Help from another person taking care of personal grooming?: A Little Help from another person toileting, which includes using toliet, bedpan, or urinal?: A Little Help from another person bathing (including washing, rinsing, drying)?: A Little Help from another person to put on and taking off regular upper body clothing?: None Help from another person to put on and taking off regular lower body clothing?: A Little 6 Click Score: 20   End of Session Equipment Utilized During Treatment: Gait belt;Rolling walker (2 wheels) (BSC) Nurse Communication: Mobility status  Activity Tolerance: Patient tolerated treatment well Patient left: in chair;with call bell/phone within reach;with chair alarm set;with family/visitor present  OT Visit Diagnosis: Unsteadiness on feet (R26.81);Other abnormalities of gait and mobility (R26.89);Muscle weakness (generalized) (M62.81);History of falling (Z91.81);Pain;Hemiplegia and hemiparesis Hemiplegia - Right/Left: Left Hemiplegia - dominant/non-dominant: Non-Dominant Hemiplegia - caused by: Cerebral infarction                Time: 0881-1031 OT Time Calculation (min): 31 min Charges:  OT General Charges $OT Visit: 1 Visit OT Evaluation $OT Eval Moderate Complexity: 1 Mod   Lander Eslick A Fuad Forget 03/19/2021, 1:03 PM

## 2021-03-19 NOTE — Progress Notes (Addendum)
Inpatient Diabetes Program Recommendations  AACE/ADA: New Consensus Statement on Inpatient Glycemic Control   Target Ranges:  Prepandial:   less than 140 mg/dL      Peak postprandial:   less than 180 mg/dL (1-2 hours)      Critically ill patients:  140 - 180 mg/dL    Latest Reference Range & Units 03/18/21 10:48 03/18/21 17:17 03/18/21 18:58 03/18/21 22:12 03/19/21 03:07 03/19/21 06:10  Glucose-Capillary 70 - 99 mg/dL 191 (H) 173 (H) 176 (H) 119 (H) 104 (H) 83    Latest Reference Range & Units 01/04/21 06:05 03/18/21 10:51  Hemoglobin A1C 4.8 - 5.6 % 10.4 (H) 11.7 (H)   Review of Glycemic Control  Diabetes history: DM2 Outpatient Diabetes medications: Lantus 18 units QAM, Lantus 6 units QHS, Novolog 7-20 units TId with meals, Ozempic 0.5 mg Qweek Current orders for Inpatient glycemic control: Semglee 12 units QHS, Novolog 0-9 units TID with meals, Novolog 4 units TID with meals  Inpatient Diabetes Program Recommendations:    HbgA1C:  A1C 11.7% on 03/18/21 indicating an average glucose of 289 mg/dl over the past 2-3 months.  NOTE: Patient admitted with acute CVA on 03/18/21. Noted current A1C 11.7% on 03/18/21 (was 10.4% on 01/04/21). In reviewing chart, noted patient seen Dr. Rockwell Germany [facility]) on 03/14/21 and per note, "Patient states that her diabetes has been uncontrolled since taking the influenza vaccine 3 months ago. On review of the labs provided today, her A1c was 9.2 in March 2022 and 9.9 in 2020. She states that her blood sugars have been elevated at home."  Will plan to follow up with patient regarding A1C.  Addendum 03/19/21@11 :54- Spoke with patient about diabetes and home regimen for diabetes control. Patient reports being followed by PCP for diabetes management and currently taking Lantus 18 units QAM, Lantus 6 units QHS, Novolog 7-20 units TID with meals, and Ozempic 0.5 mg Qweek (Wednesday) as an outpatient for diabetes control. Patient reports that she was recently changed  from Trulicity to [place] last week due to pharmacy being out of Trulicity.  Patient reports taking DM medications as prescribed and consistently.  Patient reports she was using FreeStyle [place] but she stopped using it because it was off 50-60 mg/dl when compared with finger stick glucose. Discussed that CGMs can be off up to 20% when compared to finger stick glucose. Patient reports that she received a flu vaccine several weeks ago and she has noted that her glucose has been elevated since. Patient states her glucose has been 200-300 mg/dl over the past 2 weeks.  Patient states she has not changed anything with her medications (other than change from Trulicity to Ozempic), not had any steroids over the past 2-3 months, and not changed her diet at all.  Patient states that she mainly uses her abdomen for insulin injections and rotates sites. Patient does not have any hardened areas noted on abdomen.  Discussed A1C results (11.7% on 03/18/21) and explained that current A1C indicates an average glucose of 289 mg/dl over the past 2-3 months. Discussed current A1C increased from A1C of 10.4% on 01/04/21. Discussed glucose and A1C goals. Discussed importance of checking CBGs and maintaining good CBG control to prevent long-term and short-term complications. Explained how hyperglycemia leads to damage within blood vessels which lead to the common complications seen with uncontrolled diabetes. Stressed to the patient the importance of improving glycemic control to prevent further complications from uncontrolled diabetes. Discussed impact of nutrition, exercise, stress, sickness, and medications on diabetes  control.  Encouraged patient to take her glucometer with her to follow up appointments and to ask PCP about referral to Endocrinologist to assist with improving DM control.  Patient verbalized understanding of information discussed and reports no further questions at this time related to diabetes.  Thanks, Barnie Alderman,  RN, MSN, CDE Diabetes Coordinator Inpatient Diabetes Program [phone] (Team Pager from 8am to 5pm)

## 2021-03-19 NOTE — Evaluation (Signed)
Speech Language Pathology Evaluation Patient Details Name: Briana Garrison MRN: [id number] DOB: May 15, 1950 Today's Date: 03/19/2021 Time: 7588-3254 SLP Time Calculation (min) (ACUTE ONLY): 33 min  Problem List:  Patient Active Problem List   Diagnosis Date Noted   Acute hypotension 03/18/2021   Acute left hemiparesis ([place]) 03/18/2021   Right Middle cerebral artery stenosis 03/18/2021   Hyperglycemia 03/18/2021   Dehydration 03/18/2021   Class 1 obesity    Slurred speech 01/16/2020   CKD (chronic kidney disease), stage IV ([place]) 01/16/2020   Uncontrolled type 2 diabetes mellitus with hyperglycemia ([place]) 01/16/2020   CVA (cerebral vascular accident) ([place]) 12/05/2019   Acute CVA (cerebrovascular accident) ([place]) 06/21/2019   History of renal cell carcinoma 06/21/2019   Left ventricular hypertrophy 11/26/2018   Change in stool 08/03/2018   Choledocholithiasis with obstruction 02/07/2017   AKI (acute kidney injury) ([place]) 02/07/2017   Acute kidney injury superimposed on CKD ([place]) 06/23/2011   Type 2 diabetes mellitus with hyperlipidemia ([place]) 06/23/2011   Essential hypertension 06/23/2011   Hyperlipidemia 06/23/2011   Normocytic anemia 06/23/2011   UTI (lower urinary tract infection) 06/23/2011   Past Medical History:  Past Medical History:  Diagnosis Date   Cancer of kidney ([place]) 05/14/2009   Diabetes mellitus    Endometrial mass    GERD (gastroesophageal reflux disease)    Hypercholesterolemia    Hypertension    Internal hemorrhoids    S/P colonoscopy 06/10/00 by Dr. Lajoyce Corners   Shingles    Stroke ([place]) 09/2019   2 strokes   Past Surgical History:  Past Surgical History:  Procedure Laterality Date   BIOPSY  09/06/2018   Procedure: BIOPSY;  Surgeon: Rogene Houston, MD;  Location: [facility];  Service: Endoscopy;;   CHOLECYSTECTOMY  11/13/08   COLONOSCOPY N/A 09/06/2018   Procedure: COLONOSCOPY;  Surgeon: Rogene Houston, MD;  Location: [facility] END[facility];  Service: Endoscopy;   Laterality: N/A;  [place]  05/14/09   Diagnostic hysteroscopy, D&C, endometrial polypectomy  07/01/2005   LIPECTOMY     POLYPECTOMY  09/06/2018   Procedure: POLYPECTOMY;  Surgeon: Rogene Houston, MD;  Location: [facility] END[facility];  Service: Endoscopy;;   HPI:  71 yo female adm to [facility] with fall OOB and found to be hypotensive. PMH + for prior CVAs *Pt reports 4*, renal carcinoma.  Pt found to have right frontoparietal CVA.  She reports premorbid memory deficits from prior CVAs, reports her speech is currently normal.  Pt resides with family - spouse manages bills, pt cooks/cleans and her daughter manages her medications and appointments. Daughter sets out pt's pills daily per pt.   Assessment / Plan / Recommendation Clinical Impression  Pt presents with intact language skills *expressive and receptive* as well as intelligible speech.  No focal CN deficits apparent re: speech.     [place] Mental Status Exam (SLUMS) administered with pt scoring 18/30 - indicative of cognitive deficit. Pt areas of strengths included orientation, attention, recall of narrative information.  Difficulties present with memory *storage and retrieval*, functional mental math - pt corrected when advised incorrect and drawing correct time = again self corrected when advised error.  She denies cognitive linguistic changes at this time and has necessary support at home * see HPI.    If pt/family denotes change impacting pt's functional life, SLP can be ordered to aid in rehab otherwise no follow up indicated at this time.  Advised pt the memory deficits -  to which she was aware- and reviewed attention may be improved as well as strengths in narrative.    Thanks for this consult.    SLP Assessment  SLP Recommendation/Assessment: Patient does not need any further Speech [place] Pathology Services SLP Visit Diagnosis: Cognitive communication deficit (R41.841)    Recommendations  for follow up therapy are one component of a multi-disciplinary discharge planning process, led by the attending physician.  Recommendations may be updated based on patient status, additional functional criteria and insurance authorization.    Follow Up Recommendations  No SLP follow up    Assistance Recommended at Discharge  Intermittent Supervision/Assistance  Functional Status Assessment Patient has not had a recent decline in their functional status  Frequency and Duration     N/a      SLP Evaluation Cognition  Overall Cognitive Status: History of cognitive impairments - at baseline Arousal/Alertness: Awake/alert Orientation Level: Oriented X4 Year: 2023 Day of Week: Correct Memory: Impaired Memory Impairment: Retrieval deficit;Storage deficit (recalled 3/5 words with cue- did not recognize 2/5 within list) Problem Solving: Appears intact (intact for basic functional - using call bell to have reach out for blood sugar check before meal) Safety/Judgment: Appears intact Comments: pt named 14 animals in 60 seconds       Comprehension  Auditory Comprehension Overall Auditory Comprehension: Appears within functional limits for tasks assessed Yes/No Questions: Not tested Commands: Within Functional Limits Conversation: Complex Visual Recognition/Discrimination Discrimination: Not tested Reading Comprehension Reading Status: Not tested    Expression Expression Primary Mode of Expression: Verbal Verbal Expression Overall Verbal Expression: Appears within functional limits for tasks assessed Initiation: No impairment Level of Generative/Spontaneous Verbalization: Conversation Repetition: No impairment Naming: Not tested Pragmatics: No impairment Written Expression Dominant Hand: Right Written Expression:  (clock drawing numbers in correct place, minute hand in incorrect place)   Oral / Motor  Oral Motor/Sensory Function Overall Oral Motor/Sensory Function: Within  functional limits Motor Speech Overall Motor Speech: Appears within functional limits for tasks assessed Respiration: Within functional limits Resonance: Within functional limits Articulation: Within functional limitis Intelligibility: Intelligible Motor Planning: Witnin functional limits Motor Speech Errors: Not applicable            Macario Golds 03/19/2021, 9:56 AM Kathleen Lime, MS [facility] SLP Acute Rehab Services Office [phone] Pager [phone]

## 2021-03-19 NOTE — Progress Notes (Signed)
Briana Garrison  SWF:[id number] DOB: 11/24/1950 DOA: 03/18/2021 PCP: [name], MD    Brief Narrative:  71 year old with a history of prior strokes on aspirin and Plavix, DM2, HLD, CKD stage IV, HTN, LVH, renal carcinoma, and chronic normocytic anemia who woke up 2/20 with slurred speech and a flaccid left arm and leg.  At baseline she had only very minimal left-sided weakness.  In the [facility] ED she was found to be hypotensive dehydrated and suffering with acute kidney injury.  MRI of the brain noted findings of acute CVA in the right frontoparietal lobes with marked stenosis in the right proximal M2 MCA.  After treatment of her low blood pressure with volume resuscitation her symptoms markedly improved.  She was then transferred to [facility] for definitive care.  Consultants:  Neurology  Code Status: FULL CODE  Antimicrobials:  None  DVT prophylaxis: Lovenox  Interim Hx:  Afebrile.  Blood pressure currently 220-254 systolic.  CBGs controlled.  Renal function improving with volume.  In good spirits.  Feeling much better overall.  Assessment & Plan:  Acute right frontoparietal CVA -stenosis right proximal M2 MCA Care per neurology - avoid hypotension - continue ASA plus Plavix - continue rosuvastatin - PT/OT/SLP evals   Right MCA stenosis If symptoms recur may require intervention - stable at this time - care per Neurology   Acute hypotension Appears to simply be related to dehydration in the setting of ongoing use of blood pressure medications -continue to volume resuscitate - counseled on need to maintain consistent liquid intake   AKI superimposed on CKD stage IV Monitor renal function with volume resuscitation -followed by nephrology at [place] as high as 2.03 February 2021, and 2.28 Dec 2020   Recent Labs  Lab 03/18/21 1051 03/19/21 0145  CREATININE 3.22*   3.60* 2.52*    HLD Continue medical therapy  DM2 with hyperglycemia CBG well controlled at present,  though A1c indicative of very poor control long-term -counseled on need for more strict CBG control and linked to propensity for dehydration   Family Communication: Spoke with patient and 2 of her sisters at bedside Disposition: From home -anticipate return to home -PT/OT evaluations pending  Objective: Blood pressure (!) 143/73, pulse 71, temperature 98.1 F (36.7 C), temperature source Oral, resp. rate 18, height 5\' 1"  (1.549 m), weight 84.6 kg, SpO2 99 %. No intake or output data in the 24 hours ending 03/19/21 0840 Filed Weights   03/18/21 1050 03/18/21 1400  Weight: 83.5 kg 84.6 kg    Examination: General: No acute respiratory distress Lungs: Clear to auscultation bilaterally without wheezes or crackles Cardiovascular: Regular rate and rhythm without murmur gallop or rub normal S1 and S2 Abdomen: Nontender, nondistended, soft, bowel sounds positive, no rebound, no ascites, no appreciable mass Extremities: No significant cyanosis, clubbing, or edema bilateral lower extremities  CBC: Recent Labs  Lab 03/18/21 1051 03/19/21 0145  WBC 5.0 5.7  NEUTROABS 2.8  --   HGB 11.0*   12.2 10.7*  HCT 34.8*   36.0 34.3*  MCV 92.1 89.8  PLT 209 270   Basic Metabolic Panel: Recent Labs  Lab 03/18/21 1051 03/19/21 0145  NA 132*   135 135  K 4.3   4.4 3.9  CL 100   103 107  CO2 20* 19*  GLUCOSE 223*   217* 114*  BUN 68*   68* 55*  CREATININE 3.22*   3.60* 2.52*  CALCIUM 8.9 8.7*   GFR: Estimated  Creatinine Clearance: 20.2 mL/min (A) (by C-G formula based on SCr of 2.52 mg/dL (H)).  Liver Function Tests: Recent Labs  Lab 03/18/21 1051 03/19/21 0145  AST 30 34  ALT 39 40  ALKPHOS 86 82  BILITOT 0.6 0.2*  PROT 7.3 6.6  ALBUMIN 3.6 3.1*   HbA1C: Hgb A1c MFr Bld  Date/Time Value Ref Range Status  03/18/2021 10:51 AM 11.7 (H) 4.8 - 5.6 % Final    Comment:    (NOTE)         Prediabetes: 5.7 - 6.4         Diabetes: >6.4         Glycemic control for adults with  diabetes: <7.0   01/04/2021 06:05 AM 10.4 (H) 4.8 - 5.6 % Final    Comment:    (NOTE) Pre diabetes:          5.7%-6.4%  Diabetes:              >6.4%  Glycemic control for   <7.0% adults with diabetes     Scheduled Meds:   stroke: mapping our early stages of recovery book   Does not apply Once   aspirin  81 mg Oral Daily   clopidogrel  75 mg Oral Daily   enoxaparin (LOVENOX) injection  30 mg Subcutaneous Q24H   insulin aspart  0-9 Units Subcutaneous TID WC   insulin aspart  4 Units Subcutaneous TID WC   insulin glargine-yfgn  12 Units Subcutaneous QHS   rosuvastatin  10 mg Oral QPM   Continuous Infusions:  sodium chloride 100 mL/hr at 03/19/21 0244     LOS: 1 day   Cherene Altes, MD [facility] Office  [phone] Pager - Text Page per Shea Evans  If 7PM-7AM, please contact night-coverage per Amion 03/19/2021, 8:40 AM

## 2021-03-20 LAB — BASIC METABOLIC PANEL
Anion gap: 11 (ref 5–15)
BUN: 39 mg/dL — ABNORMAL HIGH (ref 8–23)
CO2: 18 mmol/L — ABNORMAL LOW (ref 22–32)
Calcium: 8.9 mg/dL (ref 8.9–10.3)
Chloride: 109 mmol/L (ref 98–111)
Creatinine, Ser: 1.86 mg/dL — ABNORMAL HIGH (ref 0.44–1.00)
GFR, Estimated: 29 mL/min — ABNORMAL LOW (ref >60)
Glucose, Bld: 91 mg/dL (ref 70–99)
Potassium: 4.2 mmol/L (ref 3.5–5.1)
Sodium: 138 mmol/L (ref 135–145)

## 2021-03-20 LAB — HEMOGLOBIN A1C
Hgb A1c MFr Bld: 11.8 % — ABNORMAL HIGH (ref 4.8–5.6)
Mean Plasma Glucose: 292 mg/dL

## 2021-03-20 LAB — GLUCOSE, CAPILLARY
Glucose-Capillary: 109 mg/dL — ABNORMAL HIGH (ref 70–99)
Glucose-Capillary: 111 mg/dL — ABNORMAL HIGH (ref 70–99)
Glucose-Capillary: 135 mg/dL — ABNORMAL HIGH (ref 70–99)
Glucose-Capillary: 225 mg/dL — ABNORMAL HIGH (ref 70–99)
Glucose-Capillary: 271 mg/dL — ABNORMAL HIGH (ref 70–99)
Glucose-Capillary: 156 mg/dL — ABNORMAL HIGH (ref 70–99)

## 2021-03-20 MED ORDER — CLONIDINE HCL 0.1 MG PO TABS
0.1000 mg | ORAL_TABLET | Freq: Two times a day (BID) | ORAL | Status: DC
Start: 2021-03-20 — End: 2021-03-21
  Administered 2021-03-20 – 2021-03-21 (×2): 0.1 mg via ORAL
  Filled 2021-03-20 (×2): qty 1

## 2021-03-20 NOTE — Progress Notes (Signed)
°  Transition of Care [facility]) Screening Note   Patient Details  Name: IVYONNA HOELZEL Date of Birth: 14-Apr-1950   Transition of Care [facility]) CM/SW Contact:    Pollie Friar, RN Phone Number: 03/20/2021, 3:41 PM    Transition of Care Department [facility]) has reviewed patient. We will continue to monitor patient advancement through interdisciplinary progression rounds. If new patient transition needs arise, please place a TOC consult.

## 2021-03-20 NOTE — Progress Notes (Signed)
PROGRESS NOTE    Briana Garrison  HDQ:[id number] DOB: 1950-05-28 DOA: 03/18/2021 PCP: [name], MD    Chief Complaint  Patient presents with   Code Stroke    Brief Narrative:  71 year old lady prior history of stroke, on aspirin and Plavix, hyperlipidemia hypertension, diabetes stage IV CKD, renal cell carcinoma and chronic mild normocytic anemia presents with slurred speech and flaccid left arm and leg. MRI of the brain showed acute CVA in the right frontoparietal lobes with marked stenosis of the right proximal M2 MCA. Neurology consulted and recommendations given plan to continue with aspirin and Plavix and avoid hypotension. Assessment & Plan:   Principal Problem:   Acute CVA (cerebrovascular accident) ([place]) Active Problems:   Acute kidney injury superimposed on CKD ([place])   Type 2 diabetes mellitus with hyperlipidemia ([place])   Essential hypertension   Hyperlipidemia   Left ventricular hypertrophy   History of renal cell carcinoma   CKD (chronic kidney disease), stage IV ([facility])   Acute hypotension   Acute left hemiparesis ([facility])   Right Middle cerebral artery stenosis   Hyperglycemia   Dehydration   Acute right frontoparietal CVA with severe stenosis in the right proximal M2 MCA Continue with aspirin and Brilinta for 30 days followed by aspirin and Plavix.  Continue statin. Avoid hypotension Echocardiogram showed left ventricular ejection fraction of 70 to 75% with grade 1 diastolic dysfunction. MRA showed marked stenosis of the proximal right M2 MCA Hemoglobin A1c of 11.7 and LDL of 23.  Therapy evaluations recommending acute rehab. Awaiting recommendations from acute rehab. Risks factor modification.     Mark stenosis of proximal right M2 MCA Avoid low blood pressure.  Continue with aspirin and Brilinta for 30 days followed by aspirin and Plavix.  Hypertension blood pressure parameters are optimal.    Type 2 diabetes mellitus with  hyperglycemia Uncontrolled with hyperglycemia, hemoglobin A1c around 11.7 CBG (last 3)  Recent Labs    03/20/21 0620 03/20/21 0800 03/20/21 1130  GLUCAP 111* 135* 225*   Sliding scale insulin.    Hyperlipidemia Continue with statin. LDL is 23.    DVT prophylaxis: (Lovenox/) Code Status: (Full code Family Communication: none at bedside.  Disposition:   Status is: Inpatient Remains inpatient appropriate because: Unsafe DC plan, awaiting inpatient rehab recommendations neurology           Consultants:  Neurology.   Procedures: echo   Antimicrobials:  Antibiotics Given (last 72 hours)     None        Subjective: No new complaints.  Objective: Vitals:   03/19/21 1928 03/19/21 2330 03/20/21 0337 03/20/21 0802  BP: (!) 187/88 (!) 181/83 (!) 191/80 (!) 185/78  Pulse: 86 79 76 73  Resp: 18 18 17 16   Temp: 97.7 F (36.5 C) 97.9 F (36.6 C) 97.9 F (36.6 C) (!) 97.5 F (36.4 C)  TempSrc: Oral Oral Oral Oral  SpO2: 98% 98% 99% 100%  Weight:      Height:        Intake/Output Summary (Last 24 hours) at 03/20/2021 1123 Last data filed at 03/20/2021 0300 Gross per 24 hour  Intake 3366.66 ml  Output --  Net 3366.66 ml   Filed Weights   03/18/21 1050 03/18/21 1400  Weight: 83.5 kg 84.6 kg    Examination:  General exam: Appears calm and comfortable  Respiratory system: Clear to auscultation. Respiratory effort normal. Cardiovascular system: S1 & S2 heard, RRR. No JVD,  No pedal edema. Gastrointestinal system: Abdomen is nondistended,  soft and nontender.  Normal bowel sounds heard. Central nervous system: Alert and oriented. No focal neurological deficits. Extremities: Symmetric 5 x 5 power. Skin: No rashes, lesions or ulcers Psychiatry:  Mood & affect appropriate.     Data Reviewed: I have personally reviewed following labs and imaging studies  CBC: Recent Labs  Lab 03/18/21 1051 03/19/21 0145  WBC 5.0 5.7  NEUTROABS 2.8  --   HGB  11.0*   12.2 10.7*  HCT 34.8*   36.0 34.3*  MCV 92.1 89.8  PLT 209 563    Basic Metabolic Panel: Recent Labs  Lab 03/18/21 1051 03/19/21 0145 03/20/21 0055  NA 132*   135 135 138  K 4.3   4.4 3.9 4.2  CL 100   103 107 109  CO2 20* 19* 18*  GLUCOSE 223*   217* 114* 91  BUN 68*   68* 55* 39*  CREATININE 3.22*   3.60* 2.52* 1.86*  CALCIUM 8.9 8.7* 8.9    GFR: Estimated Creatinine Clearance: 27.4 mL/min (A) (by C-G formula based on SCr of 1.86 mg/dL (H)).  Liver Function Tests: Recent Labs  Lab 03/18/21 1051 03/19/21 0145  AST 30 34  ALT 39 40  ALKPHOS 86 82  BILITOT 0.6 0.2*  PROT 7.3 6.6  ALBUMIN 3.6 3.1*    CBG: Recent Labs  Lab 03/19/21 1528 03/19/21 2121 03/20/21 0335 03/20/21 0620 03/20/21 0800  GLUCAP 136* 122* 109* 111* 135*     Recent Results (from the past 240 hour(s))  Resp Panel by RT-PCR (Flu A&B, Covid) Nasopharyngeal Swab     Status: None   Collection Time: 03/18/21 10:51 AM   Specimen: Nasopharyngeal Swab; Nasopharyngeal(NP) swabs in vial transport medium  Result Value Ref Range Status   SARS Coronavirus 2 by RT PCR NEGATIVE NEGATIVE Final    Comment: (NOTE) SARS-CoV-2 target nucleic acids are NOT DETECTED.  The SARS-CoV-2 RNA is generally detectable in upper respiratory specimens during the acute phase of infection. The lowest concentration of SARS-CoV-2 viral copies this assay can detect is 138 copies/mL. A negative result does not preclude SARS-Cov-2 infection and should not be used as the sole basis for treatment or other patient management decisions. A negative result may occur with  improper specimen collection/handling, submission of specimen other than nasopharyngeal swab, presence of viral mutation(s) within the areas targeted by this assay, and inadequate number of viral copies(<138 copies/mL). A negative result must be combined with clinical observations, patient history, and epidemiological information. The expected result  is Negative.  Fact Sheet for Patients:  EntrepreneurPulse.com.au  Fact Sheet for Healthcare Providers:  IncredibleEmployment.be  This test is no t yet approved or cleared by the [place] FDA and  has been authorized for detection and/or diagnosis of SARS-CoV-2 by FDA under an Emergency Use Authorization (EUA). This EUA will remain  in effect (meaning this test can be used) for the duration of the COVID-19 declaration under Section 564(b)(1) of the Act, 21 U.S.C.section 360bbb-3(b)(1), unless the authorization is terminated  or revoked sooner.       Influenza A by PCR NEGATIVE NEGATIVE Final   Influenza B by PCR NEGATIVE NEGATIVE Final    Comment: (NOTE) The Xpert Xpress SARS-CoV-2/FLU/RSV plus assay is intended as an aid in the diagnosis of influenza from Nasopharyngeal swab specimens and should not be used as a sole basis for treatment. Nasal washings and aspirates are unacceptable for Xpert Xpress SARS-CoV-2/FLU/RSV testing.  Fact Sheet for Patients: EntrepreneurPulse.com.au  Fact Sheet for Healthcare  Providers: IncredibleEmployment.be  This test is not yet approved or cleared by the [place] and has been authorized for detection and/or diagnosis of SARS-CoV-2 by FDA under an Emergency Use Authorization (EUA). This EUA will remain in effect (meaning this test can be used) for the duration of the COVID-19 declaration under Section 564(b)(1) of the Act, 21 U.S.C. section 360bbb-3(b)(1), unless the authorization is terminated or revoked.  Performed at [facility], [address] [place], [place] [address]          Radiology Studies: MR ANGIO HEAD WO CONTRAST  Result Date: 03/18/2021 CLINICAL DATA:  Neuro deficit, acute, stroke suspected EXAM: MRI HEAD WITHOUT CONTRAST MRA HEAD WITHOUT CONTRAST TECHNIQUE: Multiplanar, multi-echo pulse sequences of the brain and surrounding structures  were acquired without intravenous contrast. Angiographic images of the Circle of Willis were acquired using MRA technique without intravenous contrast. COMPARISON:  01/03/2021 FINDINGS: MRI HEAD Brain: Scattered small foci of reduced diffusion in the right frontoparietal lobes. Additional small focus in the left gangliocapsular region. Chronic cortical/subcortical right parietal infarct. Chronic small vessel infarcts of the deep gray nuclei and adjacent white matter, pons extending into the right brachium pontis, and corpus callosum. Additional patchy and confluent areas of T2 hyperintensity in the supratentorial and pontine white matter are nonspecific but probably reflect stable moderate chronic microvascular ischemic changes. There is no intracranial mass or mass effect. Punctate focus of susceptibility is again identified along the medial left temporal lobe likely reflecting chronic microhemorrhage. There is no hydrocephalus or extra-axial fluid collection. Vascular: Unchanged diminished right vertebral artery flow void. Skull and upper cervical spine: Normal marrow signal is preserved. Sinuses/Orbits: Chronic polypoid paranasal sinus inflammatory changes. Bilateral lens replacements. Other: Sella is unremarkable.  Mastoid air cells are clear. MRA HEAD Degraded by artifact. Intracranial internal carotid arteries are patent. Middle and anterior cerebral arteries are patent. Stable stenosis at the right MCA bifurcation. Redemonstrated marked stenosis of proximal right M2 MCA branch. Stenosis of a distal left M2 MCA branch is again noted. Intracranial vertebral arteries are minimally included. Basilar artery is patent. Posterior cerebral arteries are patent with atherosclerotic irregularity. Distal right P2 and right P2-P3 junction stenoses again noted. IMPRESSION: Small acute infarcts right frontoparietal lobes. Additional small infarct in the left gangliocapsular region. Stable chronic findings detailed above.  MRA degraded by artifact. No new proximal intracranial vessel occlusion. There is similar marked stenosis of a proximal right M2 MCA branch. Electronically Signed   By: Macy Mis M.D.   On: 03/18/2021 12:29   MR BRAIN WO CONTRAST  Result Date: 03/18/2021 CLINICAL DATA:  Neuro deficit, acute, stroke suspected EXAM: MRI HEAD WITHOUT CONTRAST MRA HEAD WITHOUT CONTRAST TECHNIQUE: Multiplanar, multi-echo pulse sequences of the brain and surrounding structures were acquired without intravenous contrast. Angiographic images of the Circle of Willis were acquired using MRA technique without intravenous contrast. COMPARISON:  01/03/2021 FINDINGS: MRI HEAD Brain: Scattered small foci of reduced diffusion in the right frontoparietal lobes. Additional small focus in the left gangliocapsular region. Chronic cortical/subcortical right parietal infarct. Chronic small vessel infarcts of the deep gray nuclei and adjacent white matter, pons extending into the right brachium pontis, and corpus callosum. Additional patchy and confluent areas of T2 hyperintensity in the supratentorial and pontine white matter are nonspecific but probably reflect stable moderate chronic microvascular ischemic changes. There is no intracranial mass or mass effect. Punctate focus of susceptibility is again identified along the medial left temporal lobe likely reflecting chronic microhemorrhage. There is no hydrocephalus or  extra-axial fluid collection. Vascular: Unchanged diminished right vertebral artery flow void. Skull and upper cervical spine: Normal marrow signal is preserved. Sinuses/Orbits: Chronic polypoid paranasal sinus inflammatory changes. Bilateral lens replacements. Other: Sella is unremarkable.  Mastoid air cells are clear. MRA HEAD Degraded by artifact. Intracranial internal carotid arteries are patent. Middle and anterior cerebral arteries are patent. Stable stenosis at the right MCA bifurcation. Redemonstrated marked stenosis of  proximal right M2 MCA branch. Stenosis of a distal left M2 MCA branch is again noted. Intracranial vertebral arteries are minimally included. Basilar artery is patent. Posterior cerebral arteries are patent with atherosclerotic irregularity. Distal right P2 and right P2-P3 junction stenoses again noted. IMPRESSION: Small acute infarcts right frontoparietal lobes. Additional small infarct in the left gangliocapsular region. Stable chronic findings detailed above. MRA degraded by artifact. No new proximal intracranial vessel occlusion. There is similar marked stenosis of a proximal right M2 MCA branch. Electronically Signed   By: Macy Mis M.D.   On: 03/18/2021 12:29        Scheduled Meds:   stroke: mapping our early stages of recovery book   Does not apply Once   aspirin  81 mg Oral Daily   enoxaparin (LOVENOX) injection  30 mg Subcutaneous Q24H   insulin aspart  0-9 Units Subcutaneous TID WC   insulin aspart  4 Units Subcutaneous TID WC   insulin glargine-yfgn  12 Units Subcutaneous QHS   rosuvastatin  20 mg Oral QPM   ticagrelor  90 mg Oral BID   vitamin B-12  1,000 mcg Oral Daily   Continuous Infusions:   LOS: 2 days       Hosie Poisson, MD [facility]   To contact the attending provider between 7A-7P or the covering provider during after hours 7P-7A, please log into the web site [email or web address] and access using universal [place] password for that web site. If you do not have the password, please call the hospital operator.  03/20/2021, 11:23 AM

## 2021-03-20 NOTE — Progress Notes (Signed)
Inpatient Rehabilitation Admissions Coordinator   I met at bedside with patient for rehab assessment. We discussed goals and expectations of a possible CIR admit. She is hopeful that she progresses well enough to discharge directly home. She is in agreement to see how she does today before deciding.  Danne Baxter, RN, MSN Rehab Admissions Coordinator [phone] 03/20/2021 11:04 AM

## 2021-03-20 NOTE — Progress Notes (Signed)
Physical Therapy Treatment Patient Details Name: Briana Garrison MRN: [id number] DOB: 03/30/1950 Today's Date: 03/20/2021   History of Present Illness Briana Garrison is a 71 y/o who presented to ED after waking up wtih slurred speech, L weakness and a fall out of bed.  MRI showed findings of acute CVA in the right frontoparietal lobes. Pt with history of prior strokes (followed by Dr. Leonie Man), had been on aspirin and plavix (pt unsure if she was taking both for my history), type 2 diabetes mellitus on insulin, hyperlipidemia, chronic renal insufficiency stage IV (followed by [facility]), hypertension, microalbuminuria, LVH, history of renal carcinoma, normocytic anemia    PT Comments    Pt progressing towards all goals however continues to demonstrate both cognitive and functional deficits. Pt remains to require RW and physical assist for safe walker management for safe ambulation. Pt with noted decreased insight to safety and deficits in addition to STM loss. Discussed with patient the benefits from [facility] vs outpt as pt stated the she wanted to take therapy at [facility]". Pt demos excellent rehab potential to achieve safe supervision level of function at end of aggressive therapy program, like [facility]. Acute PT to cont to follow.    Recommendations for follow up therapy are one component of a multi-disciplinary discharge planning process, led by the attending physician.  Recommendations may be updated based on patient status, additional functional criteria and insurance authorization.  Follow Up Recommendations  Acute inpatient rehab (3hours/day)     Assistance Recommended at Discharge Frequent or constant Supervision/Assistance  Patient can return home with the following A little help with walking and/or transfers;A little help with bathing/dressing/bathroom;Assistance with cooking/housework;Direct supervision/assist for medications management;Direct supervision/assist for financial management;Assist  for transportation;Help with stairs or ramp for entrance   Equipment Recommendations   (has needed equip)    Recommendations for Other Services Rehab consult     Precautions / Restrictions Precautions Precautions: Fall Restrictions Weight Bearing Restrictions: No     Mobility  Bed Mobility Overal bed mobility: Needs Assistance Bed Mobility: Supine to Sit     Supine to sit: Supervision     General bed mobility comments: incr time, able to bring LEs off EOB, used bed rail, HOB elevated    Transfers Overall transfer level: Needs assistance Equipment used: Rolling walker (2 wheels) Transfers: Sit to/from Stand Sit to Stand: Min assist           General transfer comment: cues for handplacement with each transfer - no carryover noted    Ambulation/Gait Ambulation/Gait assistance: Min assist Gait Distance (Feet): 80 Feet Assistive device: Rolling walker (2 wheels) Gait Pattern/deviations: Step-through pattern, Decreased stride length, Decreased dorsiflexion - left Gait velocity: slow Gait velocity interpretation: <1.31 ft/sec, indicative of household ambulator   General Gait Details: minA for walker management/keeping walker close to pt, less vearing to the L today, no L knee buckling,   Stairs             Wheelchair Mobility    Modified Rankin (Stroke Patients Only) Modified Rankin (Stroke Patients Only) Pre-Morbid Rankin Score: Moderate disability Modified Rankin: Moderately severe disability     Balance Overall balance assessment: Needs assistance, History of Falls Sitting-balance support: Feet supported Sitting balance-Leahy Scale: Good     Standing balance support: During functional activity, Bilateral upper extremity supported Standing balance-Leahy Scale: Fair Standing balance comment: requires RW for safe ambulation  Cognition Arousal/Alertness: Awake/alert Behavior During Therapy: WFL for tasks  assessed/performed Overall Cognitive Status: Impaired/Different from baseline Area of Impairment: Attention, Memory, Following commands, Safety/judgement, Awareness, Problem solving, Orientation                 Orientation Level: Disoriented to, Time Current Attention Level: Sustained Memory: Decreased short-term memory Following Commands: Follows one step commands with increased time Safety/Judgement: Decreased awareness of safety Awareness: Emergent Problem Solving: Slow processing, Requires verbal cues General Comments: pt with no recall of PT or PT session yesterday. Pt did recall meeting with the "RN from upstairs, in rehab" and stated "I don't really want to go." Pt with noted decreased insight to deficits and safety.        Exercises General Exercises - Lower Extremity Long Arc Quad: AROM, Left, 10 reps, Seated (against manual resistance) Heel Slides: AROM, Left, 10 reps, Seated (against manual resistance) Mini-Sqauts: AROM, Both, 10 reps, Standing (verbal and tactile cues for technique and to go low enought, tactile cues to increased weight shift to the L to promote L muscle stimulation/contraction for strengthening)    General Comments General comments (skin integrity, edema, etc.): VSS on RA      Pertinent Vitals/Pain Pain Assessment Pain Assessment: No/denies pain    Home Living                          Prior Function            PT Goals (current goals can now be found in the care plan section) Acute Rehab PT Goals Patient Stated Goal: to get better PT Goal Formulation: With patient Time For Goal Achievement: 04/02/21 Potential to Achieve Goals: Good Progress towards PT goals: Progressing toward goals    Frequency    Min 4X/week      PT Plan Current plan remains appropriate    Co-evaluation              AM-PAC PT "6 Clicks" Mobility   Outcome Measure  Help needed turning from your back to your side while in a flat bed  without using bedrails?: A Little Help needed moving from lying on your back to sitting on the side of a flat bed without using bedrails?: A Little Help needed moving to and from a bed to a chair (including a wheelchair)?: A Little Help needed standing up from a chair using your arms (e.g., wheelchair or bedside chair)?: A Lot Help needed to walk in hospital room?: A Lot Help needed climbing 3-5 steps with a railing? : A Lot 6 Click Score: 15    End of Session Equipment Utilized During Treatment: Gait belt Activity Tolerance: Patient tolerated treatment well Patient left: in chair;with call bell/phone within reach;with chair alarm set;with family/visitor present Nurse Communication: Mobility status PT Visit Diagnosis: Unsteadiness on feet (R26.81);Muscle weakness (generalized) (M62.81);Difficulty in walking, not elsewhere classified (R26.2)     Time: 0865-7846 PT Time Calculation (min) (ACUTE ONLY): 23 min  Charges:  $Gait Training: 8-22 mins $Therapeutic Exercise: 8-22 mins                     Kittie Plater, PT, DPT Acute Rehabilitation Services Pager #: [phone] Office #: [phone]    Berline Lopes 03/20/2021, 2:18 PM

## 2021-03-21 LAB — BASIC METABOLIC PANEL
Anion gap: 11 (ref 5–15)
BUN: 30 mg/dL — ABNORMAL HIGH (ref 8–23)
CO2: 20 mmol/L — ABNORMAL LOW (ref 22–32)
Calcium: 9.4 mg/dL (ref 8.9–10.3)
Chloride: 105 mmol/L (ref 98–111)
Creatinine, Ser: 1.96 mg/dL — ABNORMAL HIGH (ref 0.44–1.00)
GFR, Estimated: 27 mL/min — ABNORMAL LOW (ref >60)
Glucose, Bld: 262 mg/dL — ABNORMAL HIGH (ref 70–99)
Potassium: 4.3 mmol/L (ref 3.5–5.1)
Sodium: 136 mmol/L (ref 135–145)

## 2021-03-21 LAB — GLUCOSE, CAPILLARY
Glucose-Capillary: 113 mg/dL — ABNORMAL HIGH (ref 70–99)
Glucose-Capillary: 169 mg/dL — ABNORMAL HIGH (ref 70–99)
Glucose-Capillary: 287 mg/dL — ABNORMAL HIGH (ref 70–99)

## 2021-03-21 MED ORDER — TICAGRELOR 90 MG PO TABS: 90.0000 mg | ORAL_TABLET | Freq: Two times a day (BID) | ORAL | 0 refills | Status: AC

## 2021-03-21 MED ORDER — ASPIRIN 81 MG PO CHEW: 81.0000 mg | CHEWABLE_TABLET | Freq: Every day | ORAL | 2 refills | Status: AC

## 2021-03-21 MED ORDER — ROSUVASTATIN CALCIUM 20 MG PO TABS: 20.0000 mg | ORAL_TABLET | Freq: Every evening | ORAL | 1 refills | Status: DC

## 2021-03-21 MED ORDER — INSULIN GLARGINE-YFGN 100 UNIT/ML ~~LOC~~ SOLN
15.0000 [IU] | Freq: Every day | SUBCUTANEOUS | Status: DC
  Filled 2021-03-21: qty 0.15

## 2021-03-21 MED ORDER — CLOPIDOGREL BISULFATE 75 MG PO TABS: 75.0000 mg | ORAL_TABLET | Freq: Every day | ORAL | 2 refills | Status: AC

## 2021-03-21 MED ORDER — INSULIN ASPART 100 UNIT/ML IJ SOLN
5.0000 [IU] | Freq: Three times a day (TID) | INTRAMUSCULAR | Status: DC
  Administered 2021-03-21: 5 [IU] via SUBCUTANEOUS

## 2021-03-21 NOTE — Discharge Summary (Signed)
Physician Discharge Summary   Patient: Briana Garrison MRN: [id number] DOB: [birth year]  Admit date:     03/18/2021  Discharge date: 03/21/21  Discharge Physician: Hosie Poisson   PCP: [name], MD   Recommendations at discharge:  Please follow up with PCP in one week  Please take aspirin and Brillinta for 30 days followed by aspirin and plavix.   Discharge Diagnoses: Principal Problem:   Acute CVA (cerebrovascular accident) ([place]) Active Problems:   Acute kidney injury superimposed on CKD ([place])   Type 2 diabetes mellitus with hyperlipidemia ([place])   Essential hypertension   Hyperlipidemia   Left ventricular hypertrophy   History of renal cell carcinoma   CKD (chronic kidney disease), stage IV ([facility])   Acute hypotension   Acute left hemiparesis ([facility])   Right Middle cerebral artery stenosis   Hyperglycemia   Dehydration  Resolved Problems:   TIA (transient ischemic attack)   Hospital Course: 71 y/o with history of prior strokes (followed by Dr. Leonie Man), had been on aspirin and plavix (pt unsure if she was taking both for my history), type 2 diabetes mellitus on insulin, hyperlipidemia, chronic renal insufficiency stage IV (followed by Lyn[facility] Citizen), hypertension, microalbuminuria, LVH, history of renal carcinoma, normocytic anemia reportedly woke up around 7 AM with slurred speech and a flaccid left arm and left leg weakness and fell out of bed when she was trying to get out of bed.  This is very unusual for her.  She has had a previous CVA with minimal left-sided symptoms.  However she reports that she could not move her left upper extremity this morning.  Family reports that she also had some difficulty speaking at the time.  She was evaluated in the ED and found to be hypotensive.  She had some signs of dehydration and acute kidney injury.  She was sent for urgent MRI without contrast.  MRI showed findings of acute CVA in the right frontoparietal lobes.  There was marked stenosis in  the right proximal M2 MCA branch.  Code stroke was called and patient was evaluated by Dr. Leonel Ramsay.  After treatment of her low blood pressure and BP improved fortunately her symptoms started to resolve.  He recommended avoiding hypotension.  He recommended continuing her current medical therapy and he recommended that she be transferred to [facility] for observation in case further therapies are needed to be considered.  EKG was without acute ST-T wave changes.  Ethyl alcohol less than 10.  SARS 2 coronavirus test negative.  Influenza testing negative.  Glucose mildly elevated at 217.  Creatinine elevated at 3.60.  Baseline creatinine around 2.2.  Hospitalist consulted for admission.  Assessment and Plan:   Acute right frontoparietal CVA with severe stenosis in the right proximal M2 MCA Continue with aspirin and Brilinta for 30 days followed by aspirin and Plavix.  Continue statin. Avoid hypotension Echocardiogram showed left ventricular ejection fraction of 70 to 75% with grade 1 diastolic dysfunction. MRA showed marked stenosis of the proximal right M2 MCA Hemoglobin A1c of 11.7 and LDL of 23. discharged home with home health.  Risks factor modification.         Mark stenosis of proximal right M2 MCA Avoid low blood pressure.  Continue with aspirin and Brilinta for 30 days followed by aspirin and Plavix.  Hypertension blood pressure parameters are optimal.       Type 2 diabetes mellitus with hyperglycemia Uncontrolled with hyperglycemia, hemoglobin A1c around 11.7 Resume home meds on discharge.  Hyperlipidemia Continue with statin. LDL is 23.       Consultants: neurology Procedures performed: none.   Disposition: Home Diet recommendation:  Discharge Diet Orders (From admission, onward)     Start     Ordered   03/21/21 0000  Diet - low sodium heart healthy        03/21/21 1159           Cardiac and Carb modified diet  DISCHARGE MEDICATION: Allergies as  of 03/21/2021       Reactions   Amlodipine Swelling   Tramadol Diarrhea, Nausea And Vomiting        Medication List     STOP taking these medications    NovoLOG FlexPen 100 UNIT/ML FlexPen Generic drug: insulin aspart       TAKE these medications    acetaminophen 500 MG tablet Commonly known as: TYLENOL Take 500 mg by mouth every 6 (six) hours as needed for mild pain or headache.   albuterol 108 (90 Base) MCG/ACT inhaler Commonly known as: VENTOLIN HFA Inhale 2 puffs into the lungs every 6 (six) hours as needed for wheezing or shortness of breath.   aspirin 81 MG chewable tablet Chew 1 tablet (81 mg total) by mouth daily.   carvedilol 25 MG tablet Commonly known as: COREG Take 1 tablet (25 mg total) by mouth 2 (two) times daily. What changed: additional instructions   cloNIDine 0.1 MG tablet Commonly known as: CATAPRES Take 0.1 mg by mouth See admin instructions. Take 1 and 1/2 tablet twice a day   clopidogrel 75 MG tablet Commonly known as: PLAVIX Take 1 tablet (75 mg total) by mouth daily. Start taking on: April 21, 2021 What changed: These instructions start on April 21, 2021. If you are unsure what to do until then, ask your doctor or other care provider.   DSS 100 MG Caps 1 capsule as needed   glucose 4 GM chewable tablet Chew 1 tablet by mouth once as needed for low blood sugar.   insulin glargine 100 unit/mL Sopn Commonly known as: LANTUS Inject 14 Units into the skin 2 (two) times daily. 14 units am    6 units bedtime What changed: additional instructions   Ozempic (0.25 or 0.5 MG/DOSE) 2 MG/1.5ML Sopn Generic drug: Semaglutide(0.25 or 0.5MG /DOS) Inject 0.5 mg into the vein once a week. Tuesday   rosuvastatin 20 MG tablet Commonly known as: CRESTOR Take 1 tablet (20 mg total) by mouth every evening. What changed:  medication strength how much to take when to take this   telmisartan 20 MG tablet Commonly known as: MICARDIS Take 20 mg by  mouth daily.   ticagrelor 90 MG Tabs tablet Commonly known as: BRILINTA Take 1 tablet (90 mg total) by mouth 2 (two) times daily.   vitamin B-12 1000 MCG tablet Commonly known as: CYANOCOBALAMIN Take 1,000 mcg by mouth daily.   Vitamin D-3 25 MCG (1000 UT) Caps Take 1,000 Units by mouth daily.        Follow-up Information     Garvin Fila, MD. Schedule an appointment as soon as possible for a visit in 1 month(s).   Specialties: Neurology, Radiology Why: stroke clinic Contact information: [place] [address] [place] Follow up.   Why: The outpatient therapy will contact you for the first appointment. If you have not heard from them in 48 hours of discharge please call to schedule  an appointment. Contact information: [address], [address] [place] [address] [phone]                 Discharge Exam: Danley Danker Weights   03/18/21 1050 03/18/21 1400  Weight: 83.5 kg 84.6 kg   Vitals with BMI 03/21/2021 03/21/2021 03/20/2021  Height - - -  Weight - - -  BMI - - -  Systolic 025 427 062  Diastolic 72 76 80  Pulse 86 69 70   General exam: Appears calm and comfortable  Respiratory system: Clear to auscultation. Respiratory effort normal. Cardiovascular system: S1 & S2 heard, RRR. No JVD,  No pedal edema. Gastrointestinal system: Abdomen is nondistended, soft and nontender. Normal bowel sounds heard. Central nervous system: Alert and oriented. No focal neurological deficits. Extremities: Symmetric 5 x 5 power. Skin: No rashes, lesions or ulcers Psychiatry: Judgement and insight appear normal. Mood & affect appropriate.    Condition at discharge: fair  The results of significant diagnostics from this hospitalization (including imaging, microbiology, ancillary and laboratory) are listed below for reference.   Imaging Studies: MR ANGIO HEAD WO CONTRAST  Result Date: 03/18/2021 CLINICAL  DATA:  Neuro deficit, acute, stroke suspected EXAM: MRI HEAD WITHOUT CONTRAST MRA HEAD WITHOUT CONTRAST TECHNIQUE: Multiplanar, multi-echo pulse sequences of the brain and surrounding structures were acquired without intravenous contrast. Angiographic images of the Circle of Willis were acquired using MRA technique without intravenous contrast. COMPARISON:  01/03/2021 FINDINGS: MRI HEAD Brain: Scattered small foci of reduced diffusion in the right frontoparietal lobes. Additional small focus in the left gangliocapsular region. Chronic cortical/subcortical right parietal infarct. Chronic small vessel infarcts of the deep gray nuclei and adjacent white matter, pons extending into the right brachium pontis, and corpus callosum. Additional patchy and confluent areas of T2 hyperintensity in the supratentorial and pontine white matter are nonspecific but probably reflect stable moderate chronic microvascular ischemic changes. There is no intracranial mass or mass effect. Punctate focus of susceptibility is again identified along the medial left temporal lobe likely reflecting chronic microhemorrhage. There is no hydrocephalus or extra-axial fluid collection. Vascular: Unchanged diminished right vertebral artery flow void. Skull and upper cervical spine: Normal marrow signal is preserved. Sinuses/Orbits: Chronic polypoid paranasal sinus inflammatory changes. Bilateral lens replacements. Other: Sella is unremarkable.  Mastoid air cells are clear. MRA HEAD Degraded by artifact. Intracranial internal carotid arteries are patent. Middle and anterior cerebral arteries are patent. Stable stenosis at the right MCA bifurcation. Redemonstrated marked stenosis of proximal right M2 MCA branch. Stenosis of a distal left M2 MCA branch is again noted. Intracranial vertebral arteries are minimally included. Basilar artery is patent. Posterior cerebral arteries are patent with atherosclerotic irregularity. Distal right P2 and right P2-P3  junction stenoses again noted. IMPRESSION: Small acute infarcts right frontoparietal lobes. Additional small infarct in the left gangliocapsular region. Stable chronic findings detailed above. MRA degraded by artifact. No new proximal intracranial vessel occlusion. There is similar marked stenosis of a proximal right M2 MCA branch. Electronically Signed   By: Macy Mis M.D.   On: 03/18/2021 12:29   MR BRAIN WO CONTRAST  Result Date: 03/18/2021 CLINICAL DATA:  Neuro deficit, acute, stroke suspected EXAM: MRI HEAD WITHOUT CONTRAST MRA HEAD WITHOUT CONTRAST TECHNIQUE: Multiplanar, multi-echo pulse sequences of the brain and surrounding structures were acquired without intravenous contrast. Angiographic images of the Circle of Willis were acquired using MRA technique without intravenous contrast. COMPARISON:  01/03/2021 FINDINGS: MRI HEAD Brain: Scattered small foci of reduced diffusion in the  right frontoparietal lobes. Additional small focus in the left gangliocapsular region. Chronic cortical/subcortical right parietal infarct. Chronic small vessel infarcts of the deep gray nuclei and adjacent white matter, pons extending into the right brachium pontis, and corpus callosum. Additional patchy and confluent areas of T2 hyperintensity in the supratentorial and pontine white matter are nonspecific but probably reflect stable moderate chronic microvascular ischemic changes. There is no intracranial mass or mass effect. Punctate focus of susceptibility is again identified along the medial left temporal lobe likely reflecting chronic microhemorrhage. There is no hydrocephalus or extra-axial fluid collection. Vascular: Unchanged diminished right vertebral artery flow void. Skull and upper cervical spine: Normal marrow signal is preserved. Sinuses/Orbits: Chronic polypoid paranasal sinus inflammatory changes. Bilateral lens replacements. Other: Sella is unremarkable.  Mastoid air cells are clear. MRA HEAD Degraded by  artifact. Intracranial internal carotid arteries are patent. Middle and anterior cerebral arteries are patent. Stable stenosis at the right MCA bifurcation. Redemonstrated marked stenosis of proximal right M2 MCA branch. Stenosis of a distal left M2 MCA branch is again noted. Intracranial vertebral arteries are minimally included. Basilar artery is patent. Posterior cerebral arteries are patent with atherosclerotic irregularity. Distal right P2 and right P2-P3 junction stenoses again noted. IMPRESSION: Small acute infarcts right frontoparietal lobes. Additional small infarct in the left gangliocapsular region. Stable chronic findings detailed above. MRA degraded by artifact. No new proximal intracranial vessel occlusion. There is similar marked stenosis of a proximal right M2 MCA branch. Electronically Signed   By: Macy Mis M.D.   On: 03/18/2021 12:29   CT HEAD CODE STROKE WO CONTRAST`  Result Date: 03/18/2021 CLINICAL DATA:  Code stroke. 71 year old female with left side weakness. Last seen well 0930 hours. EXAM: CT HEAD WITHOUT CONTRAST TECHNIQUE: Contiguous axial images were obtained from the base of the skull through the vertex without intravenous contrast. RADIATION DOSE REDUCTION: This exam was performed according to the departmental dose-optimization program which includes automated exposure control, adjustment of the mA and/or kV according to patient size and/or use of iterative reconstruction technique. COMPARISON:  Brain MRI 01/03/2021.  Head CT 01/31/2021. FINDINGS: Brain: Advanced chronic small vessel disease, multiple chronic lacunar infarcts in the bilateral deep gray matter nuclei and brainstem as demonstrated by MRI in December. Associated patchy asymmetric white matter hypodensity is stable and greater in the right hemisphere. Subtle associated chronic right parietal lobe cortical encephalomalacia. No midline shift, ventriculomegaly, mass effect, evidence of mass lesion, intracranial  hemorrhage or evidence of cortically based acute infarction. Vascular: Calcified atherosclerosis at the skull base. No suspicious intracranial vascular hyperdensity. Skull: No acute osseous abnormality identified. Sinuses/Orbits: Stable chronic paranasal sinusitis mostly on the right side. Tympanic cavities and mastoids remain well aerated. Other: Mild right gaze deviation. Visualized scalp soft tissues are within normal limits. ASPECTS [facility] Stroke Program Early CT Score) Total score (0-10 with 10 being normal): 10 IMPRESSION: 1. No acute cortically based infarct or acute intracranial hemorrhage identified. ASPECTS 10. 2. Advanced chronic small vessel disease appears stable from MRI in December. 3. Chronic paranasal sinusitis. Study discussed by telephone with Dr. Davonna Belling on 03/18/2021 at 11:15 . Electronically Signed   By: Genevie Ann M.D.   On: 03/18/2021 11:16    Microbiology: Results for orders placed or performed during the hospital encounter of 03/18/21  Resp Panel by RT-PCR (Flu A&B, Covid) Nasopharyngeal Swab     Status: None   Collection Time: 03/18/21 10:51 AM   Specimen: Nasopharyngeal Swab; Nasopharyngeal(NP) swabs in vial transport medium  Result  Value Ref Range Status   SARS Coronavirus 2 by RT PCR NEGATIVE NEGATIVE Final    Comment: (NOTE) SARS-CoV-2 target nucleic acids are NOT DETECTED.  The SARS-CoV-2 RNA is generally detectable in upper respiratory specimens during the acute phase of infection. The lowest concentration of SARS-CoV-2 viral copies this assay can detect is 138 copies/mL. A negative result does not preclude SARS-Cov-2 infection and should not be used as the sole basis for treatment or other patient management decisions. A negative result may occur with  improper specimen collection/handling, submission of specimen other than nasopharyngeal swab, presence of viral mutation(s) within the areas targeted by this assay, and inadequate number of  viral copies(<138 copies/mL). A negative result must be combined with clinical observations, patient history, and epidemiological information. The expected result is Negative.  Fact Sheet for Patients:  EntrepreneurPulse.com.au  Fact Sheet for Healthcare Providers:  IncredibleEmployment.be  This test is no t yet approved or cleared by the [place] FDA and  has been authorized for detection and/or diagnosis of SARS-CoV-2 by FDA under an Emergency Use Authorization (EUA). This EUA will remain  in effect (meaning this test can be used) for the duration of the COVID-19 declaration under Section 564(b)(1) of the Act, 21 U.S.C.section 360bbb-3(b)(1), unless the authorization is terminated  or revoked sooner.       Influenza A by PCR NEGATIVE NEGATIVE Final   Influenza B by PCR NEGATIVE NEGATIVE Final    Comment: (NOTE) The Xpert Xpress SARS-CoV-2/FLU/RSV plus assay is intended as an aid in the diagnosis of influenza from Nasopharyngeal swab specimens and should not be used as a sole basis for treatment. Nasal washings and aspirates are unacceptable for Xpert Xpress SARS-CoV-2/FLU/RSV testing.  Fact Sheet for Patients: EntrepreneurPulse.com.au  Fact Sheet for Healthcare Providers: IncredibleEmployment.be  This test is not yet approved or cleared by the [place] FDA and has been authorized for detection and/or diagnosis of SARS-CoV-2 by FDA under an Emergency Use Authorization (EUA). This EUA will remain in effect (meaning this test can be used) for the duration of the COVID-19 declaration under Section 564(b)(1) of the Act, 21 U.S.C. section 360bbb-3(b)(1), unless the authorization is terminated or revoked.  Performed at [facility], [address], [place], [place] [address]     Labs: CBC: Recent Labs  Lab 03/18/21 1051 03/19/21 0145  WBC 5.0 5.7  NEUTROABS 2.8  --   HGB 11.0*   12.2  10.7*  HCT 34.8*   36.0 34.3*  MCV 92.1 89.8  PLT 209 578   Basic Metabolic Panel: Recent Labs  Lab 03/18/21 1051 03/19/21 0145 03/20/21 0055 03/21/21 0936  NA 132*   135 135 138 136  K 4.3   4.4 3.9 4.2 4.3  CL 100   103 107 109 105  CO2 20* 19* 18* 20*  GLUCOSE 223*   217* 114* 91 262*  BUN 68*   68* 55* 39* 30*  CREATININE 3.22*   3.60* 2.52* 1.86* 1.96*  CALCIUM 8.9 8.7* 8.9 9.4   Liver Function Tests: Recent Labs  Lab 03/18/21 1051 03/19/21 0145  AST 30 34  ALT 39 40  ALKPHOS 86 82  BILITOT 0.6 0.2*  PROT 7.3 6.6  ALBUMIN 3.6 3.1*   CBG: Recent Labs  Lab 03/20/21 1130 03/20/21 1610 03/20/21 2114 03/21/21 0341 03/21/21 0606  GLUCAP 225* 271* 156* 113* 169*    Discharge time spent: 42 minutes.   Signed: Hosie Poisson, MD [facility] 03/21/2021

## 2021-03-21 NOTE — Care Management Important Message (Signed)
Important Message  Patient Details  Name: Briana Garrison MRN: [id number] Date of Birth: [birth year]   Medicare Important Message Given:  Yes     Orbie Pyo 03/21/2021, 3:25 PM

## 2021-03-21 NOTE — Progress Notes (Signed)
Occupational Therapy Treatment Patient Details Name: Briana Garrison MRN: [id number] DOB: Jan 02, 1951 Today's Date: 03/21/2021   History of present illness Briana Garrison is a 71 y/o who presented to ED after waking up wtih slurred speech, L weakness and a fall out of bed.  MRI showed findings of acute CVA in the right frontoparietal lobes. Pt with history of prior strokes (followed by Dr. Leonie Man), had been on aspirin and plavix (pt unsure if she was taking both for my history), type 2 diabetes mellitus on insulin, hyperlipidemia, chronic renal insufficiency stage IV (followed by [facility] Citizen), hypertension, microalbuminuria, LVH, history of renal carcinoma, normocytic anemia   OT comments  PT making good progress. Plan is to DC home with outpt OT follow up. Focus of session on home safety and reducing risk of falls with family. Recommend direct assistance with all mobility and ADL tasks @ RW level. Pt has gait belt at home for family to use.    Recommendations for follow up therapy are one component of a multi-disciplinary discharge planning process, led by the attending physician.  Recommendations may be updated based on patient status, additional functional criteria and insurance authorization.    Follow Up Recommendations  Outpatient OT    Assistance Recommended at Discharge Frequent or constant Supervision/Assistance  Patient can return home with the following  A little help with walking and/or transfers;A little help with bathing/dressing/bathroom;Assistance with cooking/housework;Direct supervision/assist for medications management;Direct supervision/assist for financial management;Assist for transportation;Help with stairs or ramp for entrance   Equipment Recommendations  Tub/shower bench    Recommendations for Other Services      Precautions / Restrictions Precautions Precautions: Fall       Mobility Bed Mobility               General bed mobility comments: sitting EOB     Transfers   Equipment used: Rolling walker (2 wheels)   Sit to Stand: Min assist                 Balance     Sitting balance-Leahy Scale: Good       Standing balance-Leahy Scale: Fair                             ADL either performed or assessed with clinical judgement   ADL                                         General ADL Comments: Overallmin A; educated family on need to walk on pt's L side adn need for direct assistance with tub transfers; recommend tub bench rather than tub chair to reduce ris of falls; Educated on other strategies to reduce risk of falls; family present and will provide support at DC    Extremity/Trunk Assessment Upper Extremity Assessment LUE Coordination: decreased fine motor;decreased gross motor (using for ADL tasks however slumsy)            Vision       Perception Perception Perception:  (L inattention; runnign itno ojects on L at times)   Praxis      Cognition Arousal/Alertness: Awake/alert Behavior During Therapy: WFL for tasks assessed/performed Overall Cognitive Status: Impaired/Different from baseline  General Comments: decreased awareness of deficits; mild L inattention; decreased memory adn impared attention        Exercises      Shoulder Instructions       General Comments      Pertinent Vitals/ Pain       Pain Assessment Pain Assessment: No/denies pain  Home Living                                          Prior Functioning/Environment              Frequency  Min 2X/week        Progress Toward Goals  OT Goals(current goals can now be found in the care plan section)  Progress towards OT goals: Progressing toward goals  Acute Rehab OT Goals Patient Stated Goal: to go home OT Goal Formulation: With patient Time For Goal Achievement: 04/02/21 Potential to Achieve Goals: Good ADL Goals Pt Will  Transfer to Toilet: with modified independence;ambulating Pt Will Perform Tub/Shower Transfer: with modified independence;Tub transfer Additional ADL Goal #1: Pt will indep recall at least 3 fall prevention strategies to apply to the home setting Additional ADL Goal #2: Pt will indep recall BE FAST stroke education to promte safe transition at d/c  Plan Discharge plan needs to be updated    Co-evaluation                 AM-PAC OT "6 Clicks" Daily Activity     Outcome Measure   Help from another person eating meals?: None Help from another person taking care of personal grooming?: A Little Help from another person toileting, which includes using toliet, bedpan, or urinal?: A Little Help from another person bathing (including washing, rinsing, drying)?: A Little Help from another person to put on and taking off regular upper body clothing?: A Little Help from another person to put on and taking off regular lower body clothing?: A Little 6 Click Score: 19    End of Session Equipment Utilized During Treatment: Gait belt;Rolling walker (2 wheels)  OT Visit Diagnosis: Unsteadiness on feet (R26.81);Other abnormalities of gait and mobility (R26.89);Muscle weakness (generalized) (M62.81);History of falling (Z91.81);Pain;Hemiplegia and hemiparesis Hemiplegia - Right/Left: Left Hemiplegia - dominant/non-dominant: Non-Dominant Hemiplegia - caused by: Cerebral infarction   Activity Tolerance Patient tolerated treatment well   Patient Left Other (comment) (being discharged)   Nurse Communication Other (comment) (DC needs)        Time: 0867-6195 OT Time Calculation (min): 19 min  Charges: OT General Charges $OT Visit: 1 Visit OT Treatments $Self Care/Home Management : 8-22 mins  Maurie Boettcher, OT/L   Acute OT Clinical Specialist [place] Pager [phone] Office [phone]   [facility] 03/21/2021, 1:30 PM

## 2021-03-21 NOTE — Progress Notes (Signed)
Inpatient Diabetes Program Recommendations  AACE/ADA: New Consensus Statement on Inpatient Glycemic Control   Target Ranges:  Prepandial:   less than 140 mg/dL      Peak postprandial:   less than 180 mg/dL (1-2 hours)      Critically ill patients:  140 - 180 mg/dL    Latest Reference Range & Units 03/21/21 03:41 03/21/21 06:06  Glucose-Capillary 70 - 99 mg/dL 113 (H) 169 (H)    Latest Reference Range & Units 03/20/21 08:00 03/20/21 11:30 03/20/21 16:10 03/20/21 21:14  Glucose-Capillary 70 - 99 mg/dL 135 (H) 225 (H) 271 (H) 156 (H)   Review of Glycemic Control  Diabetes history: DM2 Outpatient Diabetes medications: Lantus 18 units QAM, Lantus 6 units QHS, Novolog 7-20 units TId with meals, Ozempic 0.5 mg Qweek Current orders for Inpatient glycemic control: Semglee 12 units QHS, Novolog 0-9 units TID with meals, Novolog 4 units TID with meals   Inpatient Diabetes Program Recommendations:    Insulin: Please consider increasing meal coverage to Novolog 6 units TID with meals.  Thanks, Barnie Alderman, RN, MSN, CDE Diabetes Coordinator Inpatient Diabetes Program [phone] (Team Pager from 8am to 5pm)

## 2021-03-21 NOTE — Progress Notes (Signed)
Physical Therapy Treatment Patient Details Name: Briana Garrison MRN: [id number] DOB: [birth year] Today's Date: 03/21/2021   History of Present Illness Briana Garrison is a 71 y/o who presented to ED after waking up wtih slurred speech, L weakness and a fall out of bed.  MRI showed findings of acute CVA in the right frontoparietal lobes. Pt with history of prior strokes (followed by Dr. Leonie Man), had been on aspirin and plavix (pt unsure if she was taking both for my history), type 2 diabetes mellitus on insulin, hyperlipidemia, chronic renal insufficiency stage IV (followed by [facility]), hypertension, microalbuminuria, LVH, history of renal carcinoma, normocytic anemia    PT Comments    Has progressed well, not yet back at baseline.  Emphasis on progression of gait as negotiation of stairs safely.    Recommendations for follow up therapy are one component of a multi-disciplinary discharge planning process, led by the attending physician.  Recommendations may be updated based on patient status, additional functional criteria and insurance authorization.  Follow Up Recommendations  Outpatient PT     Assistance Recommended at Discharge Intermittent Supervision/Assistance  Patient can return home with the following A lot of help with bathing/dressing/bathroom;Assistance with cooking/housework;Assist for transportation;Help with stairs or ramp for entrance   Equipment Recommendations  None recommended by PT    Recommendations for Other Services       Precautions / Restrictions Precautions Precautions: Fall     Mobility  Bed Mobility               General bed mobility comments: sitting EOB on arrival, returned to EOB    Transfers Overall transfer level: Needs assistance Equipment used: Rolling walker (2 wheels) Transfers: Sit to/from Stand Sit to Stand: Min assist           General transfer comment: cues for handplacement with each transfer - carryover noted within  session    Ambulation/Gait Ambulation/Gait assistance: Min guard Gait Distance (Feet): 280 Feet Assistive device: Rolling walker (2 wheels) Gait Pattern/deviations: Step-through pattern Gait velocity: slower Gait velocity interpretation: 1.31 - 2.62 ft/sec, indicative of limited community ambulator   General Gait Details: generally steady, mild deviations, not LOB, safe use fo the RW once cued.  Pt reports being at least 90%   Stairs Stairs: Yes Stairs assistance: Min assist Stair Management: No rails, Step to pattern, Forwards Number of Stairs: 5 General stair comments: min assist in lieu of no rail at home.   Wheelchair Mobility    Modified Rankin (Stroke Patients Only) Modified Rankin (Stroke Patients Only) Pre-Morbid Rankin Score: Moderate disability Modified Rankin: Moderate disability     Balance Overall balance assessment: Needs assistance, History of Falls Sitting-balance support: Feet supported Sitting balance-Leahy Scale: Good     Standing balance support: During functional activity, No upper extremity supported, Single extremity supported Standing balance-Leahy Scale: Fair Standing balance comment: requires RW for safe ambulation presently                            Cognition Arousal/Alertness: Awake/alert Behavior During Therapy: WFL for tasks assessed/performed Overall Cognitive Status: History of cognitive impairments - at baseline                     Current Attention Level: Selective Memory: Decreased short-term memory Following Commands: Follows one step commands with increased time   Awareness: Emergent            Exercises  General Comments        Pertinent Vitals/Pain Pain Assessment Pain Assessment: No/denies pain    Home Living                          Prior Function            PT Goals (current goals can now be found in the care plan section) Acute Rehab PT Goals Patient Stated Goal:  to get better PT Goal Formulation: With patient Time For Goal Achievement: 04/02/21 Potential to Achieve Goals: Good Progress towards PT goals: Progressing toward goals    Frequency    Min 3X/week      PT Plan Discharge plan needs to be updated;Frequency needs to be updated    Co-evaluation              AM-PAC PT "6 Clicks" Mobility   Outcome Measure  Help needed turning from your back to your side while in a flat bed without using bedrails?: A Little Help needed moving from lying on your back to sitting on the side of a flat bed without using bedrails?: A Little Help needed moving to and from a bed to a chair (including a wheelchair)?: A Little Help needed standing up from a chair using your arms (e.g., wheelchair or bedside chair)?: A Little Help needed to walk in hospital room?: A Little Help needed climbing 3-5 steps with a railing? : A Little 6 Click Score: 18    End of Session   Activity Tolerance: Patient tolerated treatment well Patient left: in bed;with call bell/phone within reach;with family/visitor present Nurse Communication: Mobility status PT Visit Diagnosis: Other abnormalities of gait and mobility (R26.89)     Time: 1610-9604 PT Time Calculation (min) (ACUTE ONLY): 18 min  Charges:  $Gait Training: 8-22 mins                     03/21/2021  Ginger Carne., PT Acute Rehabilitation Services [phone]  (pager) [phone]  (office)   Tessie Fass Wanya Bangura 03/21/2021, 1:02 PM

## 2021-03-21 NOTE — TOC Transition Note (Addendum)
Transition of Care [facility]) - CM/SW Discharge Note   Patient Details  Name: Briana Garrison MRN: [id number] Date of Birth: 11-Apr-1950  Transition of Care [facility]) CM/SW Contact:  Pollie Friar, RN Phone Number: 03/21/2021, 12:45 PM   Clinical Narrative:    Patient is deciding to d/c home instead of inpatient rehab. Family is at the bedside and will provide transport home.  Pt wants to attend outpatient therapy at [facility] in [place]. Orders faxed to [facility]: [phone]. Information on the AVS.  Pt has all needed DME at home.  Pt states she has supervision at home between her spouse and daughter. Family can provide transport to appointments.Cm provided pts family with 30 day free Brilinta coupon.   Final next level of care: OP Rehab Barriers to Discharge: No Barriers Identified   Patient Goals and CMS Choice     Choice offered to / list presented to : Patient  Discharge Placement                       Discharge Plan and Services                                     Social Determinants of Health (SDOH) Interventions     Readmission Risk Interventions Readmission Risk Prevention Plan 06/22/2019  Transportation Screening Complete  PCP or Specialist Appt within 5-7 Days Complete  Home Care Screening Complete  Medication Review (RN CM) Referral to Pharmacy  Some recent data might be hidden

## 2021-03-21 NOTE — Progress Notes (Signed)
Inpatient Rehabilitation Admissions Coordinator   I met with patient and her friend at bedside. Patient states she wants to pursue Outpatient therapy at [facility] in [place] as she has done in the past. She states she has discussed with her daughter and spouse. I have alerted acute team and TOC. We will sign off at this time.  Danne Baxter, RN, MSN Rehab Admissions Coordinator [phone] 03/21/2021 10:53 AM

## 2021-04-10 DIAGNOSIS — R2689 Other abnormalities of gait and mobility: Secondary | ICD-10-CM | POA: Diagnosis not present

## 2021-04-10 DIAGNOSIS — R531 Weakness: Secondary | ICD-10-CM | POA: Diagnosis not present

## 2021-04-10 DIAGNOSIS — I69398 Other sequelae of cerebral infarction: Secondary | ICD-10-CM | POA: Diagnosis not present

## 2021-04-15 DIAGNOSIS — R2689 Other abnormalities of gait and mobility: Secondary | ICD-10-CM | POA: Diagnosis not present

## 2021-04-15 DIAGNOSIS — R531 Weakness: Secondary | ICD-10-CM | POA: Diagnosis not present

## 2021-04-15 DIAGNOSIS — I69398 Other sequelae of cerebral infarction: Secondary | ICD-10-CM | POA: Diagnosis not present

## 2021-04-23 DIAGNOSIS — I10 Essential (primary) hypertension: Secondary | ICD-10-CM | POA: Diagnosis not present

## 2021-04-23 DIAGNOSIS — E1165 Type 2 diabetes mellitus with hyperglycemia: Secondary | ICD-10-CM | POA: Diagnosis not present

## 2021-04-25 DIAGNOSIS — I69398 Other sequelae of cerebral infarction: Secondary | ICD-10-CM | POA: Diagnosis not present

## 2021-04-25 DIAGNOSIS — R2689 Other abnormalities of gait and mobility: Secondary | ICD-10-CM | POA: Diagnosis not present

## 2021-04-25 DIAGNOSIS — R531 Weakness: Secondary | ICD-10-CM | POA: Diagnosis not present

## 2021-04-29 DIAGNOSIS — E1165 Type 2 diabetes mellitus with hyperglycemia: Secondary | ICD-10-CM | POA: Diagnosis not present

## 2021-04-30 DIAGNOSIS — E1165 Type 2 diabetes mellitus with hyperglycemia: Secondary | ICD-10-CM | POA: Diagnosis not present

## 2021-04-30 DIAGNOSIS — I10 Essential (primary) hypertension: Secondary | ICD-10-CM | POA: Diagnosis not present

## 2021-05-01 ENCOUNTER — Telehealth: Payer: Self-pay | Admitting: Neurology

## 2021-05-01 NOTE — Telephone Encounter (Signed)
[facility] [name]) left voicemail at 1:18pm; They sent PT initial evaluation plan of care on March 15th and need it signed and returned.  ?Fax; [phone] ? ?Returned call to [employer]. Original doctor that [name] requested does not work at [employer], but she is asking if Dr. Leonie Man could sign the initial evaluation plan of care.  ?

## 2021-05-02 DIAGNOSIS — R29898 Other symptoms and signs involving the musculoskeletal system: Secondary | ICD-10-CM | POA: Diagnosis not present

## 2021-05-02 DIAGNOSIS — I69898 Other sequelae of other cerebrovascular disease: Secondary | ICD-10-CM | POA: Diagnosis not present

## 2021-05-02 DIAGNOSIS — R531 Weakness: Secondary | ICD-10-CM | POA: Diagnosis not present

## 2021-05-02 DIAGNOSIS — R2689 Other abnormalities of gait and mobility: Secondary | ICD-10-CM | POA: Diagnosis not present

## 2021-05-02 NOTE — Telephone Encounter (Signed)
Katie called back, message from [place], [place] was relayed.  They will fax and wait while being mindful of 05-13-21 ?

## 2021-05-02 NOTE — Telephone Encounter (Signed)
Left message for Briana Garrison to return my call.  ? ?Patient was recently seen by Dr. Leonie Man in our office (on 01/08/21). She was hospitalized 03/18/21 for CVA. The patient was referred to PT following discharge. She has a pending appt here 05/29/21. ?____________________________________ ? ?When Briana Garrison, PT calls back, please give her the following message: ? ?I checked our faxed files from March. We never received the initial evaluation plan of care. It may be due to the orders being in the hospital doctor's name. She may fax over the orders for Dr. Leonie Man to review for signature. However, he will not be back in our office until 05/13/21.  ?

## 2021-05-16 DIAGNOSIS — R531 Weakness: Secondary | ICD-10-CM | POA: Diagnosis not present

## 2021-05-16 DIAGNOSIS — R29898 Other symptoms and signs involving the musculoskeletal system: Secondary | ICD-10-CM | POA: Diagnosis not present

## 2021-05-16 DIAGNOSIS — I69898 Other sequelae of other cerebrovascular disease: Secondary | ICD-10-CM | POA: Diagnosis not present

## 2021-05-16 DIAGNOSIS — R2689 Other abnormalities of gait and mobility: Secondary | ICD-10-CM | POA: Diagnosis not present

## 2021-05-22 DIAGNOSIS — E1165 Type 2 diabetes mellitus with hyperglycemia: Secondary | ICD-10-CM | POA: Diagnosis not present

## 2021-05-22 DIAGNOSIS — I10 Essential (primary) hypertension: Secondary | ICD-10-CM | POA: Diagnosis not present

## 2021-05-22 DIAGNOSIS — E7801 Familial hypercholesterolemia: Secondary | ICD-10-CM | POA: Diagnosis not present

## 2021-05-23 DIAGNOSIS — R2689 Other abnormalities of gait and mobility: Secondary | ICD-10-CM | POA: Diagnosis not present

## 2021-05-23 DIAGNOSIS — R531 Weakness: Secondary | ICD-10-CM | POA: Diagnosis not present

## 2021-05-23 DIAGNOSIS — I69898 Other sequelae of other cerebrovascular disease: Secondary | ICD-10-CM | POA: Diagnosis not present

## 2021-05-23 DIAGNOSIS — R29898 Other symptoms and signs involving the musculoskeletal system: Secondary | ICD-10-CM | POA: Diagnosis not present

## 2021-05-25 ENCOUNTER — Emergency Department (HOSPITAL_COMMUNITY)
Admission: EM | Admit: 2021-05-25 | Discharge: 2021-05-26 | Disposition: A | Discharge status: Home / Self Care | Payer: Medicare PPO | Attending: Emergency Medicine | Admitting: Emergency Medicine

## 2021-05-25 ENCOUNTER — Other Ambulatory Visit: Payer: Self-pay

## 2021-05-25 ENCOUNTER — Encounter (HOSPITAL_COMMUNITY): Payer: Self-pay | Admitting: Emergency Medicine

## 2021-05-25 DIAGNOSIS — H9202 Otalgia, left ear: Secondary | ICD-10-CM | POA: Diagnosis not present

## 2021-05-25 DIAGNOSIS — R739 Hyperglycemia, unspecified: Secondary | ICD-10-CM

## 2021-05-25 DIAGNOSIS — E1165 Type 2 diabetes mellitus with hyperglycemia: Secondary | ICD-10-CM | POA: Diagnosis not present

## 2021-05-25 LAB — CBG MONITORING, ED
Glucose-Capillary: 165 mg/dL — ABNORMAL HIGH (ref 70–99)
Glucose-Capillary: 545 mg/dL (ref 70–99)

## 2021-05-25 NOTE — ED Notes (Signed)
Not at bedside at this time ?

## 2021-05-25 NOTE — ED Triage Notes (Signed)
Pt reported to ED with c/o pain to left ear since last night. Pt endorses runny nose since taking flu shot in November. Denies any sore throat, cough, fever or headache.  ?

## 2021-05-26 LAB — BASIC METABOLIC PANEL
Anion gap: 5 (ref 5–15)
BUN: 35 mg/dL — ABNORMAL HIGH (ref 8–23)
CO2: 21 mmol/L — ABNORMAL LOW (ref 22–32)
Calcium: 9.2 mg/dL (ref 8.9–10.3)
Chloride: 107 mmol/L (ref 98–111)
Creatinine, Ser: 2.25 mg/dL — ABNORMAL HIGH (ref 0.44–1.00)
GFR, Estimated: 23 mL/min — ABNORMAL LOW (ref >60)
Glucose, Bld: 202 mg/dL — ABNORMAL HIGH (ref 70–99)
Potassium: 4.2 mmol/L (ref 3.5–5.1)
Sodium: 133 mmol/L — ABNORMAL LOW (ref 135–145)

## 2021-05-26 LAB — CBC
HCT: 33.4 % — ABNORMAL LOW (ref 36.0–46.0)
Hemoglobin: 10.8 g/dL — ABNORMAL LOW (ref 12.0–15.0)
MCH: 29.4 pg (ref 26.0–34.0)
MCHC: 32.3 g/dL (ref 30.0–36.0)
MCV: 91 fL (ref 80.0–100.0)
Platelets: 213 10*3/uL (ref 150–400)
RBC: 3.67 MIL/uL — ABNORMAL LOW (ref 3.87–5.11)
RDW: 14.2 % (ref 11.5–15.5)
WBC: 6.6 10*3/uL (ref 4.0–10.5)
nRBC: 0 % (ref 0.0–0.2)

## 2021-05-26 MED ORDER — AMOXICILLIN 500 MG PO CAPS
500.0000 mg | ORAL_CAPSULE | Freq: Once | ORAL | Status: AC
  Administered 2021-05-26: 500 mg via ORAL
  Filled 2021-05-26: qty 1

## 2021-05-26 MED ORDER — AMOXICILLIN 500 MG PO CAPS: 500.0000 mg | ORAL_CAPSULE | Freq: Three times a day (TID) | ORAL | 0 refills | Status: DC

## 2021-05-26 NOTE — ED Notes (Signed)
Remains not in the bed at this time ?

## 2021-05-26 NOTE — ED Provider Notes (Signed)
?[facility] DEPT ?[facility] Emergency Department ?Provider Note ?MRN:  [id number]  ?Arrival date & time: 05/26/21    ? ?Chief Complaint   ?Otalgia ?  ?History of Present Illness   ?Briana Garrison is a 71 y.o. year-old female presents to the ED with chief complaint of left sided otalgia x 2 days.  Denies dental pain or fever.  Denies any injury.  States that the pain keeps her from sleeping.  Has tried OTC titanium pain patch without relief. ? ?History provided by patient. ? ? ?Review of Systems  ?Pertinent review of systems noted in HPI.  ? ? ?Physical Exam  ? ?Vitals:  ? 05/25/21 1920  ?BP: (!) 141/67  ?Pulse: 71  ?Resp: 18  ?Temp: 97.9 ?F (36.6 ?C)  ?SpO2: 100%  ?  ?CONSTITUTIONAL:  well-appearing, NAD ?NEURO:  Alert and oriented x 3, CN 3-12 grossly intact ?EYES:  eyes equal and reactive ?ENT/NECK:  Supple, no stridor, TMs clear bilaterally, no dental abscess  ?CARDIO:  normal rate, regular rhythm, appears well-perfused  ?PULM:  No respiratory distress,  ?GI/GU:  non-distended,  ?MSK/SPINE:  No gross deformities, no edema, moves all extremities  ?SKIN:  no rash, atraumatic ? ? ?*Additional and/or pertinent findings included in MDM below ? ?Diagnostic and Interventional Summary  ? ? EKG Interpretation ? ?Date/Time:    ?Ventricular Rate:    ?PR Interval:    ?QRS Duration:   ?QT Interval:    ?QTC Calculation:   ?R Axis:     ?Text Interpretation:   ?  ? ?  ? ?Labs Reviewed  ?CBC - Abnormal; Notable for the following components:  ?    Result Value  ? RBC 3.67 (*)   ? Hemoglobin 10.8 (*)   ? HCT 33.4 (*)   ? All other components within normal limits  ?BASIC METABOLIC PANEL - Abnormal; Notable for the following components:  ? Sodium 133 (*)   ? CO2 21 (*)   ? Glucose, Bld 202 (*)   ? BUN 35 (*)   ? Creatinine, Ser 2.25 (*)   ? GFR, Estimated 23 (*)   ? All other components within normal limits  ?CBG MONITORING, ED - Abnormal; Notable for the following components:  ? Glucose-Capillary 165 (*)   ? All other  components within normal limits  ?CBG MONITORING, ED - Abnormal; Notable for the following components:  ? Glucose-Capillary 545 (*)   ? All other components within normal limits  ?  ?No orders to display  ?  ?Medications  ?amoxicillin (AMOXIL) capsule 500 mg (has no administration in time range)  ?  ? ?Procedures  /  Critical Care ?Procedures ? ?ED Course and Medical Decision Making  ?I have reviewed the triage vital signs, the nursing notes, and pertinent available records from the EMR. ? ?Complexity of Problems Addressed: ?Moderate Complexity: Acute illness with systemic symptoms, requiring diagnostic workup to rule out more severe disease. ?Comorbidities affecting this illness/injury include: ?DM, CKD ?Social Determinants Affecting Care: ?No clinically significant social determinants affecting this chief complaint.. ? ? ?ED Course: ?After considering the following differential, otitis, dental pain, I ordered labs due to elevated CBG in triage. ?I personally interpreted the labs which are notable for about baseline for patient.  Non elevated anion gap . ? ?  ? ?Consultants: ?No consultations were needed in caring for this patient. ? ?Treatment and Plan: ?Amox for otalgia vs dental pain.  PCP follow-up. ? ?Emergency department workup does not suggest an emergent condition requiring  admission or immediate intervention beyond  what has been performed at this time. The patient is safe for discharge and has  been instructed to return immediately for worsening symptoms, change in  symptoms or any other concerns ? ?Patient seen by and discussed with attending physician, Dr. Randal Buba, who agrees with discharge plan. ? ?Final Clinical Impressions(s) / ED Diagnoses  ? ?  ICD-10-CM   ?1. Left ear pain  H92.02   ?  ?2. Hyperglycemia  R73.9   ?  ?  ?ED Discharge Orders   ? ?      Ordered  ?  amoxicillin (AMOXIL) 500 MG capsule  3 times daily       ? 05/26/21 0131  ? ?  ?  ? ?  ?  ? ? ?Discharge Instructions Discussed with and  Provided to Patient:  ? ?Discharge Instructions   ?None ?  ? ?  ?Montine Circle, PA-C ?05/26/21 0144 ? ?  ?Palumbo, April, MD ?05/26/21 0149 ? ?

## 2021-05-27 ENCOUNTER — Telehealth: Payer: Self-pay | Admitting: Neurology

## 2021-05-27 NOTE — Telephone Encounter (Signed)
We have the orders with note stating the patient had one initial evaluation on 04/10/21. The order was signed and faxed back to [phone]. Confirmation received.  ?

## 2021-05-27 NOTE — Telephone Encounter (Signed)
At 9:31 this morning Briana Garrison @ [facility] left a vm stating she has 2 plan of cares not signed by Dr Leonie Man yet.  The 1st is from 03-15 and the 2nd is from 04-20.  [name] is asking they be returned by fax to [phone] their phone # is [phone]  ?

## 2021-05-27 NOTE — Telephone Encounter (Signed)
We have not received the care plans. I returned the call and spoke to Briana Garrison. She will ask [name] to send them again to fax#[phone]. ?

## 2021-05-29 ENCOUNTER — Encounter: Payer: Self-pay | Admitting: Neurology

## 2021-05-29 ENCOUNTER — Ambulatory Visit: Payer: Medicare PPO | Admitting: Neurology

## 2021-05-29 VITALS — BP 120/78 | HR 71 | Ht 61.0 in | Wt 181.0 lb

## 2021-05-29 DIAGNOSIS — R2689 Other abnormalities of gait and mobility: Secondary | ICD-10-CM | POA: Insufficient documentation

## 2021-05-29 DIAGNOSIS — I6521 Occlusion and stenosis of right carotid artery: Secondary | ICD-10-CM

## 2021-05-29 DIAGNOSIS — Z8639 Personal history of other endocrine, nutritional and metabolic disease: Secondary | ICD-10-CM | POA: Insufficient documentation

## 2021-05-29 DIAGNOSIS — R2681 Unsteadiness on feet: Secondary | ICD-10-CM | POA: Insufficient documentation

## 2021-05-29 DIAGNOSIS — K006 Disturbances in tooth eruption: Secondary | ICD-10-CM | POA: Insufficient documentation

## 2021-05-29 DIAGNOSIS — M6200 Separation of muscle (nontraumatic), unspecified site: Secondary | ICD-10-CM | POA: Insufficient documentation

## 2021-05-29 DIAGNOSIS — H9201 Otalgia, right ear: Secondary | ICD-10-CM | POA: Insufficient documentation

## 2021-05-29 DIAGNOSIS — Z794 Long term (current) use of insulin: Secondary | ICD-10-CM | POA: Insufficient documentation

## 2021-05-29 DIAGNOSIS — Z86010 Personal history of colonic polyps: Secondary | ICD-10-CM | POA: Insufficient documentation

## 2021-05-29 DIAGNOSIS — R600 Localized edema: Secondary | ICD-10-CM | POA: Insufficient documentation

## 2021-05-29 DIAGNOSIS — N2581 Secondary hyperparathyroidism of renal origin: Secondary | ICD-10-CM | POA: Insufficient documentation

## 2021-05-29 DIAGNOSIS — E278 Other specified disorders of adrenal gland: Secondary | ICD-10-CM | POA: Insufficient documentation

## 2021-05-29 DIAGNOSIS — K5900 Constipation, unspecified: Secondary | ICD-10-CM | POA: Insufficient documentation

## 2021-05-29 DIAGNOSIS — R7402 Elevation of levels of lactic acid dehydrogenase (LDH): Secondary | ICD-10-CM | POA: Insufficient documentation

## 2021-05-29 DIAGNOSIS — G4733 Obstructive sleep apnea (adult) (pediatric): Secondary | ICD-10-CM | POA: Insufficient documentation

## 2021-05-29 DIAGNOSIS — E7800 Pure hypercholesterolemia, unspecified: Secondary | ICD-10-CM | POA: Insufficient documentation

## 2021-05-29 DIAGNOSIS — E1121 Type 2 diabetes mellitus with diabetic nephropathy: Secondary | ICD-10-CM | POA: Insufficient documentation

## 2021-05-29 DIAGNOSIS — R109 Unspecified abdominal pain: Secondary | ICD-10-CM | POA: Insufficient documentation

## 2021-05-29 DIAGNOSIS — M25519 Pain in unspecified shoulder: Secondary | ICD-10-CM | POA: Insufficient documentation

## 2021-05-29 DIAGNOSIS — E1165 Type 2 diabetes mellitus with hyperglycemia: Secondary | ICD-10-CM | POA: Insufficient documentation

## 2021-05-29 DIAGNOSIS — R519 Headache, unspecified: Secondary | ICD-10-CM | POA: Insufficient documentation

## 2021-05-29 DIAGNOSIS — Z820 Family history of epilepsy and other diseases of the nervous system: Secondary | ICD-10-CM | POA: Insufficient documentation

## 2021-05-29 DIAGNOSIS — M75100 Unspecified rotator cuff tear or rupture of unspecified shoulder, not specified as traumatic: Secondary | ICD-10-CM | POA: Insufficient documentation

## 2021-05-29 DIAGNOSIS — E1142 Type 2 diabetes mellitus with diabetic polyneuropathy: Secondary | ICD-10-CM | POA: Insufficient documentation

## 2021-05-29 DIAGNOSIS — R911 Solitary pulmonary nodule: Secondary | ICD-10-CM | POA: Insufficient documentation

## 2021-05-29 DIAGNOSIS — Z8673 Personal history of transient ischemic attack (TIA), and cerebral infarction without residual deficits: Secondary | ICD-10-CM | POA: Insufficient documentation

## 2021-05-29 DIAGNOSIS — E875 Hyperkalemia: Secondary | ICD-10-CM | POA: Insufficient documentation

## 2021-05-29 DIAGNOSIS — I69354 Hemiplegia and hemiparesis following cerebral infarction affecting left non-dominant side: Secondary | ICD-10-CM | POA: Diagnosis not present

## 2021-05-29 DIAGNOSIS — R292 Abnormal reflex: Secondary | ICD-10-CM | POA: Insufficient documentation

## 2021-05-29 DIAGNOSIS — J302 Other seasonal allergic rhinitis: Secondary | ICD-10-CM | POA: Insufficient documentation

## 2021-05-29 NOTE — Progress Notes (Signed)
?[facility] ?[address] ?. [place] [address] ?[phone] ? ?     OFFICE FOLLOW-UP NOTE ? ?[name] ?Date of Birth:  Mar 11, 1950 ?Medical Record Number:  [id number]  ? ?HPI: Initial visit 02/21/2020:[name] is a 71 year old African-American lady seen today for initial office follow-up visit following hospital admission for stroke October 2021.  History is obtained from the patient, review of electronic medical records and I personally reviewed pertinent imaging films in PACS.  She has past medical history of diabetes, hypertension, gastroesophageal reflux disease, shingles, prior right thalamic stroke in May 2021 with mild residual left-sided numbness who presented on 12/05/2019 with sudden onset of slurred speech and left-sided weakness.  Symptoms fluctuated and she did not reach the hospital right away.  MRI scan showed patchy right parieto-occipital and right insular cortical and subcortical acute infarcts as well as multiple old bilateral basal ganglia, thalamic and pontine lacunar infarcts.  MRI of the brain showed severe distal right M1 and moderate to severe left anterior cerebral artery and PCAs posterior cerebral artery stenosis.  Carotid ultrasound showed no significant extracranial stenosis.  2D echo showed ejection fraction of 60 to 65% without cardiac source of embolism.  LDL cholesterol was 49 mg percent and hemoglobin A1c was 8.4.  She was started on dual antiplatelet therapy of aspirin Plavix for 3 months due to intracranial stenosis and discharge.  Patient subsequently was seen on 01/16/2020 for recurrent transient dysarthria by Dr. Merlene Garrison at [facility].  MRI scan of the brain at this time did not show an acute stroke.  Patient states which she is tolerating aspirin Plavix well without bruising or bleeding.  She was seen by me initially in May 2021 when she had a right thalamic lacunar stroke.  She enrolled to participate in the sleep smart study at  that time she was participating the sleep smart study and actually finished participation in November.  She complains of short-term memory difficulties and difficulty remembering recent information.  She however still quite independent and does most activities of daily living by herself.  She has had recent infection in the ear for which she was treated with steroids which has led to increasing her sugars and fastings sugar now ranges in the 200s.  She does have upcoming appointment next month to see endocrinologist Dr. Suzette Garrison.  She is able to ambulate with a 4 pronged cane and is careful and has not had any falls.  She is currently doing outpatient physical and occupational therapy.  She still she is still off balance but is learned to compensate with a cane. ?Update 07/11/2020: She returns for follow-up after last visit for an of months ago.  She is accompanied by daughter.  Patient has not had any recurrent TIA or stroke symptoms.  She is doing outpatient physical therapy which is helping her with her walking and balance.  She still occasionally stumbles and has only 1 minor fall.  She continues to have paresthesias in the left hand and leg which are intermittent and not bothersome.  Patient was advised to me to get memory panel labs done at last visit but for unclear reason has not been done.  EEG done on 03/06/2020 was normal.  She continues to have mild short-term memory difficulties with she has been participating in cognitively challenging activities like playing games on the computer that seems to help.  Patient admits she has not been very compliant with using her CPAP for his sleep apnea.  She has no new complaints. ?Update 01/08/2021 ; patient is seen for follow-up after last visit in our office on June 2022.  She was admitted to [facility] with a TIA on 01/03/2021.  She developed sudden onset of left facial droop and slurred speech noticed by the daughter.  The symptoms quickly resolved by the time  she reached the emergency room and 20 minutes or so.  CT scan of the head was unremarkable and MRI scan of the brain showed no acute abnormality.  MR angiogram of the brain and neck showed stable appearance of multifocal stenosis in the right vertebral artery as well as moderate distal right M1 middle cerebral artery and inferior division right M2 stenosis with moderate bilateral PCA stenosis as well.  Echocardiogram shows ejection fraction of 70 to 75% which severe left ventricular hypertrophy but no cardiac source of embolism.  LDL cholesterol was quite low at 60 mg percent but triglycerides were elevated at 253 mg percent.  Hemoglobin A1c was elevated at 10.4.  Patient states she is done well since then.  She has been on aspirin and Plavix following this episode prior to which she was only on Plavix.  She is tolerating them well without bruising or bleeding.  She states her diabetes control is not quite good but she plans to discuss this with primary physician.  Her blood pressure is usually under better control but today it is slightly elevated in office at 148/71.  She is tolerating Lipitor well without muscle aches and pains.  She does use a CPAP every night for sleep apnea without fail.  She continues to have some dizziness and gait imbalance but otherwise is at her baseline. ?Update 05/29/2021 ; she returns for follow-up after last visit with me 5 months ago.  She is accompanied by her daughter.  Patient was admitted to [facility] on 03/18/2021 when she presented with abrupt onset of slurred speech and left hemiparesis when awakening from sleep.  She was found to be significantly hypotensive in the emergency room and MRI scan showed small right frontoparietal punctate infarcts and MRA showed severe stenosis of proximal M2 MCA branches and the stroke was felt to be due to hypertension.  Her symptoms subsided promptly after blood pressure was restored.  Echocardiogram on 01/04/2021 showed ejection  fraction of 70 to 75% with severe left ventricular hypertrophy.  LDL cholesterol was 23 mg percent but hemoglobin A1c was elevated at 11.7.  Patient was switched to aspirin and Brilinta for 30 days and subsequently back to aspirin and Plavix which is tolerating well without bleeding and only minor bruising.  She is currently participating outpatient physical and occupational therapy and states her walking is much improved.  She however complains of some left knee and leg pain which persist.  She is tolerating Crestor without muscle aches and pains.  Blood pressure is better now and today it is 120/78.  She walks with a 4 pronged cane. ?ROS:   ?14 system review of systems is positive for imbalance, facial droop, slurred speech, memory loss, ear infection, dysarthria, sleep apnea and all other systems negative ? ?PMH:  ?Past Medical History:  ?Diagnosis Date  ? Cancer of kidney ([facility]) 05/14/2009  ? Diabetes mellitus   ? Endometrial mass   ? GERD (gastroesophageal reflux disease)   ? Hypercholesterolemia   ? Hypertension   ? Internal hemorrhoids   ? S/P colonoscopy 06/10/00 by Dr. Lajoyce Corners  ? Shingles   ? Stroke [facility]) 09/2019  ?  2 strokes  ? ? ?Social History:  ?Social History  ? ?Socioeconomic History  ? Marital status: Married  ?  Spouse name: [name]  ? Number of children: 2  ? Years of education: 53  ? Highest education level: Not on file  ?Occupational History  ? 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  Employer: RETIRED  ?Tobacco Use  ? Smoking status: Never  ? Smokeless tobacco: Never  ?Vaping Use  ? Vaping Use: Never used  ?Substance and Sexual Activity  ? Alcohol use: No  ? Drug use: No  ? Sexual activity: Not on file  ?Other Topics Concern  ? Not on file  ?Social History Narrative  ? Lives with Husband and daughter  ? Right Handed  ? Drinks no caffeine daily  ? ?Social Determinants of Health  ? ?Financial Resource Strain: Not on file  ?Food Insecurity: Not on file  ?Transportation Needs:  Not on file  ?Physical Activity: Not on file  ?Stress: Not on file  ?Social Connections: Not on file  ?Intimate Partner Violence: Not on file  ? ? ?Medications:   ?Current Outpatient Medications on File Pri

## 2021-05-29 NOTE — Patient Instructions (Signed)
I had a long d/w patient  and her daughter about her recent stroke, intracranial stenosis,risk for recurrent stroke/TIAs, personally independently reviewed imaging studies and stroke evaluation results and answered questions.Continue aspirin 81 mg daily and clopidogrel 75 mg daily  for secondary stroke prevention and maintain strict control of hypertension with blood pressure goal below 130/90, diabetes with hemoglobin A1c goal below 6.5% and lipids with LDL cholesterol goal below 70 mg/dL. I also advised the patient to eat a healthy diet with plenty of whole grains, cereals, fruits and vegetables, exercise regularly and maintain ideal body weight .she was advised to use a cane at all times and discussed fall precautions.  Followup in the future with me in 6 months or call earlier if necessary. ?Stroke Prevention ?Some medical conditions and behaviors can lead to a higher chance of having a stroke. You can help prevent a stroke by eating healthy, exercising, not smoking, and managing any medical conditions you have. ?Stroke is a leading cause of functional impairment. Primary prevention is particularly important because a majority of strokes are first-time events. Stroke changes the lives of not only those who experience a stroke but also their family and other caregivers. ?How can this condition affect me? ?A stroke is a medical emergency and should be treated right away. A stroke can lead to brain damage and can sometimes be life-threatening. If a person gets medical treatment right away, there is a better chance of surviving and recovering from a stroke. ?What can increase my risk? ?The following medical conditions may increase your risk of a stroke: ?Cardiovascular disease. ?High blood pressure (hypertension). ?Diabetes. ?High cholesterol. ?Sickle cell disease. ?Blood clotting disorders (hypercoagulable state). ?Obesity. ?Sleep disorders (obstructive sleep apnea). ?Other risk factors include: ?Being older than  age 43. ?Having a history of blood clots, stroke, or mini-stroke (transient ischemic attack, TIA). ?Genetic factors, such as race, ethnicity, or a family history of stroke. ?Smoking cigarettes or using other tobacco products. ?Taking birth control pills, especially if you also use tobacco. ?Heavy use of alcohol or drugs, especially cocaine and methamphetamine. ?Physical inactivity. ?What actions can I take to prevent this? ?Manage your health conditions ?High cholesterol levels. ?Eating a healthy diet is important for preventing high cholesterol. If cholesterol cannot be managed through diet alone, you may need to take medicines. ?Take any prescribed medicines to control your cholesterol as told by your health care provider. ?Hypertension. ?To reduce your risk of stroke, try to keep your blood pressure below 130/80. ?Eating a healthy diet and exercising regularly are important for controlling blood pressure. If these steps are not enough to manage your blood pressure, you may need to take medicines. ?Take any prescribed medicines to control hypertension as told by your health care provider. ?Ask your health care provider if you should monitor your blood pressure at home. ?Have your blood pressure checked every year, even if your blood pressure is normal. Blood pressure increases with age and some medical conditions. ?Diabetes. ?Eating a healthy diet and exercising regularly are important parts of managing your blood sugar (glucose). If your blood sugar cannot be managed through diet and exercise, you may need to take medicines. ?Take any prescribed medicines to control your diabetes as told by your health care provider. ?Get evaluated for obstructive sleep apnea. Talk to your health care provider about getting a sleep evaluation if you snore a lot or have excessive sleepiness. ?Make sure that any other medical conditions you have, such as atrial fibrillation or atherosclerosis, are managed. ?Nutrition ?  Follow  instructions from your health care provider about what to eat or drink to help manage your health condition. These instructions may include: ?Reducing your daily calorie intake. ?Limiting how much salt (sodium) you use to 1,500 milligrams (mg) each day. ?Using only healthy fats for cooking, such as olive oil, canola oil, or sunflower oil. ?Eating healthy foods. You can do this by: ?Choosing foods that are high in fiber, such as whole grains, and fresh fruits and vegetables. ?Eating at least 5 servings of fruits and vegetables a day. Try to fill one-half of your plate with fruits and vegetables at each meal. ?Choosing lean protein foods, such as lean cuts of meat, poultry without skin, fish, tofu, beans, and nuts. ?Eating low-fat dairy products. ?Avoiding foods that are high in sodium. This can help lower blood pressure. ?Avoiding foods that have saturated fat, trans fat, and cholesterol. This can help prevent high cholesterol. ?Avoiding processed and prepared foods. ?Counting your daily carbohydrate intake. ? ?Lifestyle ?If you drink alcohol: ?Limit how much you have to: ?0-1 drink a day for women who are not pregnant. ?0-2 drinks a day for men. ?Know how much alcohol is in your drink. In the [place] one drink equals one 12 oz bottle of beer (373m), one 5 oz glass of wine (1468m, or one 1? oz glass of hard liquor (4452m ?Do not use any products that contain nicotine or tobacco. These products include cigarettes, chewing tobacco, and vaping devices, such as e-cigarettes. If you need help quitting, ask your health care provider. ?Avoid secondhand smoke. ?Do not use drugs. ?Activity ? ?Try to stay at a healthy weight. ?Get at least 30 minutes of exercise on most days, such as: ?Fast walking. ?Biking. ?Swimming. ?Medicines ?Take over-the-counter and prescription medicines only as told by your health care provider. Aspirin or blood thinners (antiplatelets or anticoagulants) may be recommended to reduce your risk of  forming blood clots that can lead to stroke. ?Avoid taking birth control pills. Talk to your health care provider about the risks of taking birth control pills if: ?You are over 35 22ars old. ?You smoke. ?You get very bad headaches. ?You have had a blood clot. ?Where to find more information ?American Stroke Association: www.strokeassociation.org ?Get help right away if: ?You or a loved one has any symptoms of a stroke. "BE FAST" is an easy way to remember the main warning signs of a stroke: ?B - Balance. Signs are dizziness, sudden trouble walking, or loss of balance. ?E - Eyes. Signs are trouble seeing or a sudden change in vision. ?F - Face. Signs are sudden weakness or numbness of the face, or the face or eyelid drooping on one side. ?A - Arms. Signs are weakness or numbness in an arm. This happens suddenly and usually on one side of the body. ?S - Speech. Signs are sudden trouble speaking, slurred speech, or trouble understanding what people say. ?T - Time. Time to call emergency services. Write down what time symptoms started. ?You or a loved one has other signs of a stroke, such as: ?A sudden, severe headache with no known cause. ?Nausea or vomiting. ?Seizure. ?These symptoms may represent a serious problem that is an emergency. Do not wait to see if the symptoms will go away. Get medical help right away. Call your local emergency services (911 in the [place] Do not drive yourself to the hospital. ?Summary ?You can help to prevent a stroke by eating healthy, exercising, not smoking, limiting alcohol intake, and managing any  medical conditions you may have. ?Do not use any products that contain nicotine or tobacco. These include cigarettes, chewing tobacco, and vaping devices, such as e-cigarettes. If you need help quitting, ask your health care provider. ?Remember "BE FAST" for warning signs of a stroke. Get help right away if you or a loved one has any of these signs. ?This information is not intended to  replace advice given to you by your health care provider. Make sure you discuss any questions you have with your health care provider. ?Document Revised: 08/15/2019 Document Reviewed: 08/15/2019 ?Elsevier Patient Ed

## 2021-05-30 DIAGNOSIS — R2689 Other abnormalities of gait and mobility: Secondary | ICD-10-CM | POA: Diagnosis not present

## 2021-05-30 DIAGNOSIS — Z8673 Personal history of transient ischemic attack (TIA), and cerebral infarction without residual deficits: Secondary | ICD-10-CM | POA: Diagnosis not present

## 2021-05-30 DIAGNOSIS — Z794 Long term (current) use of insulin: Secondary | ICD-10-CM | POA: Diagnosis not present

## 2021-05-30 DIAGNOSIS — Z0000 Encounter for general adult medical examination without abnormal findings: Secondary | ICD-10-CM | POA: Diagnosis not present

## 2021-05-30 DIAGNOSIS — N184 Chronic kidney disease, stage 4 (severe): Secondary | ICD-10-CM | POA: Diagnosis not present

## 2021-05-30 DIAGNOSIS — D631 Anemia in chronic kidney disease: Secondary | ICD-10-CM | POA: Diagnosis not present

## 2021-05-30 DIAGNOSIS — E1122 Type 2 diabetes mellitus with diabetic chronic kidney disease: Secondary | ICD-10-CM | POA: Diagnosis not present

## 2021-05-30 DIAGNOSIS — I700 Atherosclerosis of aorta: Secondary | ICD-10-CM | POA: Diagnosis not present

## 2021-05-30 DIAGNOSIS — R531 Weakness: Secondary | ICD-10-CM | POA: Diagnosis not present

## 2021-05-30 DIAGNOSIS — I69898 Other sequelae of other cerebrovascular disease: Secondary | ICD-10-CM | POA: Diagnosis not present

## 2021-05-30 DIAGNOSIS — R29898 Other symptoms and signs involving the musculoskeletal system: Secondary | ICD-10-CM | POA: Diagnosis not present

## 2021-05-30 DIAGNOSIS — N2581 Secondary hyperparathyroidism of renal origin: Secondary | ICD-10-CM | POA: Diagnosis not present

## 2021-05-30 DIAGNOSIS — I739 Peripheral vascular disease, unspecified: Secondary | ICD-10-CM | POA: Diagnosis not present

## 2021-05-31 DIAGNOSIS — E1165 Type 2 diabetes mellitus with hyperglycemia: Secondary | ICD-10-CM | POA: Diagnosis not present

## 2021-06-06 DIAGNOSIS — I69898 Other sequelae of other cerebrovascular disease: Secondary | ICD-10-CM | POA: Diagnosis not present

## 2021-06-06 DIAGNOSIS — R2689 Other abnormalities of gait and mobility: Secondary | ICD-10-CM | POA: Diagnosis not present

## 2021-06-06 DIAGNOSIS — R29898 Other symptoms and signs involving the musculoskeletal system: Secondary | ICD-10-CM | POA: Diagnosis not present

## 2021-06-06 DIAGNOSIS — R531 Weakness: Secondary | ICD-10-CM | POA: Diagnosis not present

## 2021-06-13 DIAGNOSIS — R29898 Other symptoms and signs involving the musculoskeletal system: Secondary | ICD-10-CM | POA: Diagnosis not present

## 2021-06-13 DIAGNOSIS — R531 Weakness: Secondary | ICD-10-CM | POA: Diagnosis not present

## 2021-06-13 DIAGNOSIS — R2689 Other abnormalities of gait and mobility: Secondary | ICD-10-CM | POA: Diagnosis not present

## 2021-06-13 DIAGNOSIS — I69898 Other sequelae of other cerebrovascular disease: Secondary | ICD-10-CM | POA: Diagnosis not present

## 2021-06-20 DIAGNOSIS — R531 Weakness: Secondary | ICD-10-CM | POA: Diagnosis not present

## 2021-06-20 DIAGNOSIS — R29898 Other symptoms and signs involving the musculoskeletal system: Secondary | ICD-10-CM | POA: Diagnosis not present

## 2021-06-20 DIAGNOSIS — R2689 Other abnormalities of gait and mobility: Secondary | ICD-10-CM | POA: Diagnosis not present

## 2021-06-20 DIAGNOSIS — I69898 Other sequelae of other cerebrovascular disease: Secondary | ICD-10-CM | POA: Diagnosis not present

## 2021-07-10 ENCOUNTER — Ambulatory Visit: Payer: Medicare PPO | Admitting: Neurology

## 2021-11-06 ENCOUNTER — Other Ambulatory Visit: Payer: Self-pay

## 2021-11-06 ENCOUNTER — Emergency Department (HOSPITAL_COMMUNITY): Payer: Medicare HMO

## 2021-11-06 ENCOUNTER — Emergency Department (HOSPITAL_COMMUNITY)
Admission: EM | Admit: 2021-11-06 | Discharge: 2021-11-06 | Discharge status: Against Medical Advice | Payer: Medicare HMO | Attending: Student | Admitting: Student

## 2021-11-06 ENCOUNTER — Encounter (HOSPITAL_COMMUNITY): Payer: Self-pay

## 2021-11-06 DIAGNOSIS — Z8673 Personal history of transient ischemic attack (TIA), and cerebral infarction without residual deficits: Secondary | ICD-10-CM | POA: Insufficient documentation

## 2021-11-06 DIAGNOSIS — R4781 Slurred speech: Secondary | ICD-10-CM | POA: Diagnosis present

## 2021-11-06 DIAGNOSIS — Z7901 Long term (current) use of anticoagulants: Secondary | ICD-10-CM | POA: Insufficient documentation

## 2021-11-06 DIAGNOSIS — R9431 Abnormal electrocardiogram [ECG] [EKG]: Secondary | ICD-10-CM | POA: Insufficient documentation

## 2021-11-06 DIAGNOSIS — I6782 Cerebral ischemia: Secondary | ICD-10-CM | POA: Insufficient documentation

## 2021-11-06 DIAGNOSIS — Z5321 Procedure and treatment not carried out due to patient leaving prior to being seen by health care provider: Secondary | ICD-10-CM | POA: Insufficient documentation

## 2021-11-06 LAB — CBC
HCT: 31.6 % — ABNORMAL LOW (ref 36.0–46.0)
Hemoglobin: 10 g/dL — ABNORMAL LOW (ref 12.0–15.0)
MCH: 28.5 pg (ref 26.0–34.0)
MCHC: 31.6 g/dL (ref 30.0–36.0)
MCV: 90 fL (ref 80.0–100.0)
Platelets: 179 10*3/uL (ref 150–400)
RBC: 3.51 MIL/uL — ABNORMAL LOW (ref 3.87–5.11)
RDW: 14 % (ref 11.5–15.5)
WBC: 5.2 10*3/uL (ref 4.0–10.5)
nRBC: 0 % (ref 0.0–0.2)

## 2021-11-06 LAB — I-STAT CHEM 8, ED
BUN: 47 mg/dL — ABNORMAL HIGH (ref 8–23)
Calcium, Ion: 1.21 mmol/L (ref 1.15–1.40)
Chloride: 112 mmol/L — ABNORMAL HIGH (ref 98–111)
Creatinine, Ser: 3 mg/dL — ABNORMAL HIGH (ref 0.44–1.00)
Glucose, Bld: 110 mg/dL — ABNORMAL HIGH (ref 70–99)
HCT: 29 % — ABNORMAL LOW (ref 36.0–46.0)
Hemoglobin: 9.9 g/dL — ABNORMAL LOW (ref 12.0–15.0)
Potassium: 4.6 mmol/L (ref 3.5–5.1)
Sodium: 142 mmol/L (ref 135–145)
TCO2: 22 mmol/L (ref 22–32)

## 2021-11-06 LAB — DIFFERENTIAL
Abs Immature Granulocytes: 0.03 10*3/uL (ref 0.00–0.07)
Basophils Absolute: 0 10*3/uL (ref 0.0–0.1)
Basophils Relative: 1 %
Eosinophils Absolute: 0.3 10*3/uL (ref 0.0–0.5)
Eosinophils Relative: 5 %
Immature Granulocytes: 1 %
Lymphocytes Relative: 27 %
Lymphs Abs: 1.4 10*3/uL (ref 0.7–4.0)
Monocytes Absolute: 0.6 10*3/uL (ref 0.1–1.0)
Monocytes Relative: 11 %
Neutro Abs: 2.9 10*3/uL (ref 1.7–7.7)
Neutrophils Relative %: 55 %

## 2021-11-06 LAB — COMPREHENSIVE METABOLIC PANEL
ALT: 16 U/L (ref 0–44)
AST: 16 U/L (ref 15–41)
Albumin: 3.2 g/dL — ABNORMAL LOW (ref 3.5–5.0)
Alkaline Phosphatase: 64 U/L (ref 38–126)
Anion gap: 7 (ref 5–15)
BUN: 50 mg/dL — ABNORMAL HIGH (ref 8–23)
CO2: 21 mmol/L — ABNORMAL LOW (ref 22–32)
Calcium: 8.9 mg/dL (ref 8.9–10.3)
Chloride: 113 mmol/L — ABNORMAL HIGH (ref 98–111)
Creatinine, Ser: 2.85 mg/dL — ABNORMAL HIGH (ref 0.44–1.00)
GFR, Estimated: 17 mL/min — ABNORMAL LOW (ref >60)
Glucose, Bld: 114 mg/dL — ABNORMAL HIGH (ref 70–99)
Potassium: 4.6 mmol/L (ref 3.5–5.1)
Sodium: 141 mmol/L (ref 135–145)
Total Bilirubin: 0.2 mg/dL — ABNORMAL LOW (ref 0.3–1.2)
Total Protein: 6.4 g/dL — ABNORMAL LOW (ref 6.5–8.1)

## 2021-11-06 LAB — CBG MONITORING, ED
Glucose-Capillary: 102 mg/dL — ABNORMAL HIGH (ref 70–99)
Glucose-Capillary: 121 mg/dL — ABNORMAL HIGH (ref 70–99)
Glucose-Capillary: 58 mg/dL — ABNORMAL LOW (ref 70–99)

## 2021-11-06 LAB — PROTIME-INR
INR: 1 (ref 0.8–1.2)
Prothrombin Time: 13.3 seconds (ref 11.4–15.2)

## 2021-11-06 LAB — ETHANOL: Alcohol, Ethyl (B): <10 mg/dL (ref <10)

## 2021-11-06 LAB — APTT: aPTT: 30 seconds (ref 24–36)

## 2021-11-06 MED ORDER — SODIUM CHLORIDE 0.9% FLUSH: 3.0000 mL | Freq: Once | INTRAVENOUS | Status: DC

## 2021-11-06 NOTE — ED Triage Notes (Signed)
Patient reports daughter thinks her speech is slurred.  Patient reports her speech is normal.  Patient has mild left sided facial droop. Patient reports she is tired from doing an event yesterday day.  Patient has had previous strokes reports left sided sensory deficit.  Normal grip strengths.

## 2021-11-06 NOTE — ED Provider Triage Note (Signed)
Emergency Medicine Provider Triage Evaluation Note  Briana Garrison , a 71 y.o. female  was evaluated in triage.  Presenting with potential stroke symptoms.  Patient's daughter reports that this morning when they got on the phone she was slurring her speech.  She spoke with her daughter last night her daughter did not mention this.  She has a history of CVA with baseline left-sided sensation difference from the right..  Denies any weakness, facial drooping, falls.  Per chart review patient is on Plavix  Review of Systems  Positive:  Negative:   Physical Exam  There were no vitals taken for this visit. Gen:   Awake, no distress   Resp:  Normal effort  MSK:   Moves extremities without difficulty  Other:  Cranial nerves II through XII grossly intact.  No problem with finger-nose testing.  EOMs intact, PERRLA.  Does appear to have a mild left-sided facial droop however unsure if this is baseline  Medical Decision Making  Medically screening exam initiated at 9:07 AM.  Appropriate orders placed.  Briana Garrison was informed that the remainder of the evaluation will be completed by another provider, this initial triage assessment does not replace that evaluation, and the importance of remaining in the ED until their evaluation is complete.    No code stroke due to patient going to bed normal last night.  Woke up with potentially slurred speech per daughter.  Patient has no weakness or visual disturbance.  Doubt LVO at this time.  We will do stroke work-up without code stroke   Rhae Hammock, PA-C 11/06/21 [id number]

## 2021-11-06 NOTE — ED Notes (Signed)
Call for vitals no answer x1

## 2021-11-06 NOTE — ED Notes (Signed)
Pt stated she was a diabetic and felt like her sugar needed to be checked. This RN checked her sugar, CBG 58. Provider in triage notified. Pt given apple juice and graham crackers.

## 2021-11-06 NOTE — ED Notes (Signed)
No answer x2 

## 2021-11-12 ENCOUNTER — Telehealth: Payer: Self-pay

## 2021-11-12 NOTE — Patient Outreach (Signed)
  Care Coordination   11/12/2021 Name: Briana Garrison MRN: [id number] DOB: 1950/03/19   Care Coordination Outreach Attempts:  An unsuccessful telephone outreach was attempted today to offer the patient information about available care coordination services as a benefit of their health plan.   Follow Up Plan:  Additional outreach attempts will be made to offer the patient care coordination information and services.   Encounter Outcome:  No Answer  Care Coordination Interventions Activated:  No   Care Coordination Interventions:  No, not indicated    Jone Baseman, RN, MSN [facility] Care Management Care Management Coordinator Direct Line [phone]

## 2021-11-27 ENCOUNTER — Telehealth: Payer: Self-pay

## 2021-11-27 DIAGNOSIS — Z794 Long term (current) use of insulin: Secondary | ICD-10-CM | POA: Diagnosis not present

## 2021-11-27 DIAGNOSIS — I10 Essential (primary) hypertension: Secondary | ICD-10-CM | POA: Diagnosis not present

## 2021-11-27 DIAGNOSIS — Z8673 Personal history of transient ischemic attack (TIA), and cerebral infarction without residual deficits: Secondary | ICD-10-CM | POA: Diagnosis not present

## 2021-11-27 DIAGNOSIS — E1169 Type 2 diabetes mellitus with other specified complication: Secondary | ICD-10-CM | POA: Diagnosis not present

## 2021-11-27 DIAGNOSIS — E1165 Type 2 diabetes mellitus with hyperglycemia: Secondary | ICD-10-CM | POA: Diagnosis not present

## 2021-11-27 DIAGNOSIS — E7800 Pure hypercholesterolemia, unspecified: Secondary | ICD-10-CM | POA: Diagnosis not present

## 2021-11-27 NOTE — Patient Outreach (Signed)
  Care Coordination   11/27/2021 Name: Briana Garrison MRN: [id number] DOB: October 27, 1950   Care Coordination Outreach Attempts:  A second unsuccessful outreach was attempted today to offer the patient with information about available care coordination services as a benefit of their health plan.     Follow Up Plan:  Additional outreach attempts will be made to offer the patient care coordination information and services.   Encounter Outcome:  No Answer  Care Coordination Interventions Activated:  No   Care Coordination Interventions:  No, not indicated     Jone Baseman, RN, MSN [facility] Care Management Care Management Coordinator Direct Line [phone]

## 2021-12-02 DIAGNOSIS — E7800 Pure hypercholesterolemia, unspecified: Secondary | ICD-10-CM | POA: Diagnosis not present

## 2021-12-02 DIAGNOSIS — I10 Essential (primary) hypertension: Secondary | ICD-10-CM | POA: Diagnosis not present

## 2021-12-02 DIAGNOSIS — Z8673 Personal history of transient ischemic attack (TIA), and cerebral infarction without residual deficits: Secondary | ICD-10-CM | POA: Diagnosis not present

## 2021-12-02 DIAGNOSIS — Z794 Long term (current) use of insulin: Secondary | ICD-10-CM | POA: Diagnosis not present

## 2021-12-02 DIAGNOSIS — E1165 Type 2 diabetes mellitus with hyperglycemia: Secondary | ICD-10-CM | POA: Diagnosis not present

## 2021-12-02 DIAGNOSIS — E1169 Type 2 diabetes mellitus with other specified complication: Secondary | ICD-10-CM | POA: Diagnosis not present

## 2021-12-09 DIAGNOSIS — Z8673 Personal history of transient ischemic attack (TIA), and cerebral infarction without residual deficits: Secondary | ICD-10-CM | POA: Diagnosis not present

## 2021-12-09 DIAGNOSIS — E7800 Pure hypercholesterolemia, unspecified: Secondary | ICD-10-CM | POA: Diagnosis not present

## 2021-12-09 DIAGNOSIS — E1169 Type 2 diabetes mellitus with other specified complication: Secondary | ICD-10-CM | POA: Diagnosis not present

## 2021-12-09 DIAGNOSIS — E1165 Type 2 diabetes mellitus with hyperglycemia: Secondary | ICD-10-CM | POA: Diagnosis not present

## 2021-12-09 DIAGNOSIS — I10 Essential (primary) hypertension: Secondary | ICD-10-CM | POA: Diagnosis not present

## 2021-12-09 DIAGNOSIS — Z794 Long term (current) use of insulin: Secondary | ICD-10-CM | POA: Diagnosis not present

## 2021-12-11 ENCOUNTER — Ambulatory Visit: Payer: Medicare PPO | Admitting: Neurology

## 2021-12-11 ENCOUNTER — Encounter: Payer: Self-pay | Admitting: Neurology

## 2021-12-16 ENCOUNTER — Telehealth: Payer: Self-pay

## 2021-12-16 DIAGNOSIS — E7800 Pure hypercholesterolemia, unspecified: Secondary | ICD-10-CM | POA: Diagnosis not present

## 2021-12-16 DIAGNOSIS — E1169 Type 2 diabetes mellitus with other specified complication: Secondary | ICD-10-CM | POA: Diagnosis not present

## 2021-12-16 DIAGNOSIS — E1165 Type 2 diabetes mellitus with hyperglycemia: Secondary | ICD-10-CM | POA: Diagnosis not present

## 2021-12-16 DIAGNOSIS — Z8673 Personal history of transient ischemic attack (TIA), and cerebral infarction without residual deficits: Secondary | ICD-10-CM | POA: Diagnosis not present

## 2021-12-16 DIAGNOSIS — I10 Essential (primary) hypertension: Secondary | ICD-10-CM | POA: Diagnosis not present

## 2021-12-16 DIAGNOSIS — Z794 Long term (current) use of insulin: Secondary | ICD-10-CM | POA: Diagnosis not present

## 2021-12-16 NOTE — Patient Outreach (Signed)
  Care Coordination   Initial Visit Note   12/16/2021 Name: Briana Garrison MRN: [id number] DOB: 09/08/1950  Briana Garrison is a 71 y.o. year old female who sees [name], MD for primary care. I spoke with  Germaine Pomfret by phone today.  What matters to the patients health and wellness today?  Blood sugar management    Goals Addressed             This Visit's Progress    COMPLETED: Care Coorindation Activities-No follow up required       Care Coordination Interventions: Advised patient to [facility] services and support. Patient currently part of CCM program at PCP office.          SDOH assessments and interventions completed:  No     Care Coordination Interventions Activated:  No  Care Coordination Interventions:  No, not indicated   Follow up plan: No further intervention required.   Encounter Outcome:  Pt. Visit Completed   Jone Baseman, RN, MSN [facility] Care Management Coordinator Direct Line [phone]

## 2021-12-16 NOTE — Patient Instructions (Signed)
Visit Information  Thank you for taking time to visit with me today. Please don't hesitate to contact me if I can be of assistance to you.   Following are the goals we discussed today:   Goals Addressed             This Visit's Progress    COMPLETED: Care Coorindation Activities-No follow up required       Care Coordination Interventions: Advised patient to [facility] services and support. Patient currently part of CCM program at PCP office.          If you are experiencing a Mental Health or [place] or need someone to talk to, please call the Suicide and Crisis Lifeline: 988   The patient verbalized understanding of instructions, educational materials, and care plan provided today and DECLINED offer to receive copy of patient instructions, educational materials, and care plan.   No further follow up required: patient part of CCM program  Jone Baseman, RN, MSN [facility] Care Management Coordinator Direct Line [phone]

## 2021-12-30 DIAGNOSIS — Z7901 Long term (current) use of anticoagulants: Secondary | ICD-10-CM | POA: Diagnosis not present

## 2021-12-30 DIAGNOSIS — J330 Polyp of nasal cavity: Secondary | ICD-10-CM | POA: Diagnosis not present

## 2021-12-30 DIAGNOSIS — Z8673 Personal history of transient ischemic attack (TIA), and cerebral infarction without residual deficits: Secondary | ICD-10-CM | POA: Diagnosis not present

## 2021-12-30 DIAGNOSIS — J324 Chronic pansinusitis: Secondary | ICD-10-CM | POA: Diagnosis not present

## 2022-01-28 IMAGING — MR MR MRA HEAD W/O CM
2 series · 18 of 48 positions shown · non-contrast
Comparison: Brain MRI 06/21/2019

CLINICAL DATA: Stroke follow-up

EXAM:
MRA HEAD WITHOUT CONTRAST
TECHNIQUE: Angiographic images of the Circle of Willis were obtained using MRA
technique without intravenous contrast.

[Series 5: 3d cow · axial · 0.5mm · 0.41mm/px · z∈[-142,-61]mm · 17 of 172 slices shown]
[im 1/172]
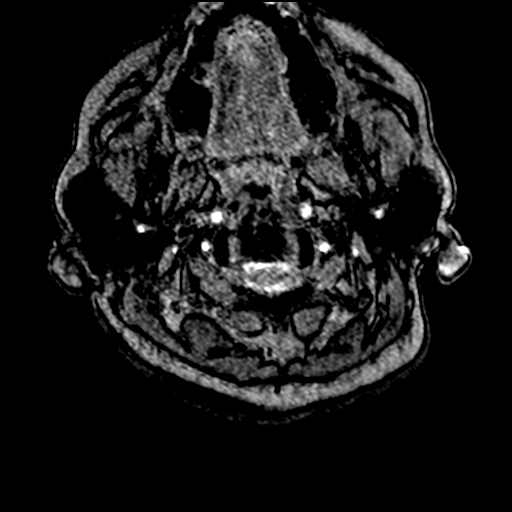
[im 4/172]
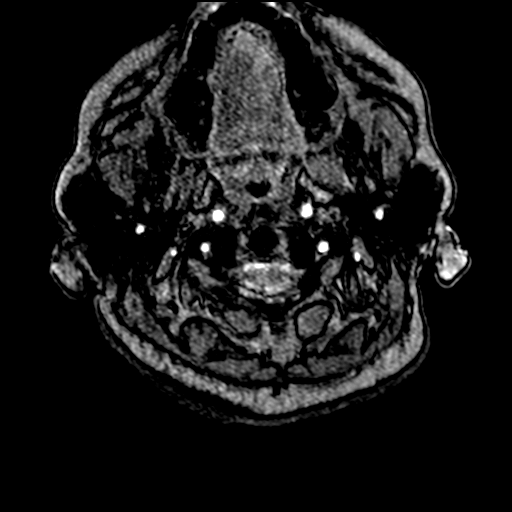
[im 8/172]
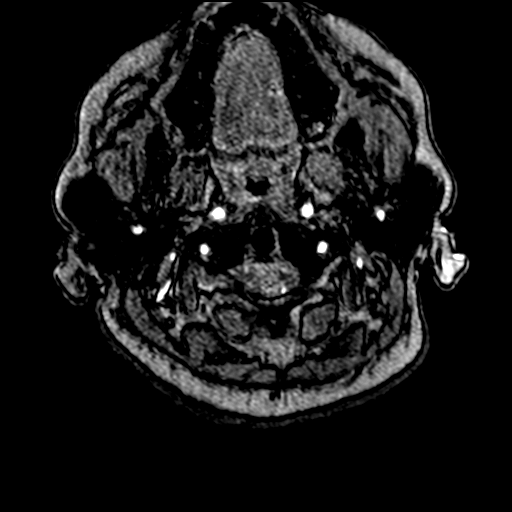
[im 12/172]
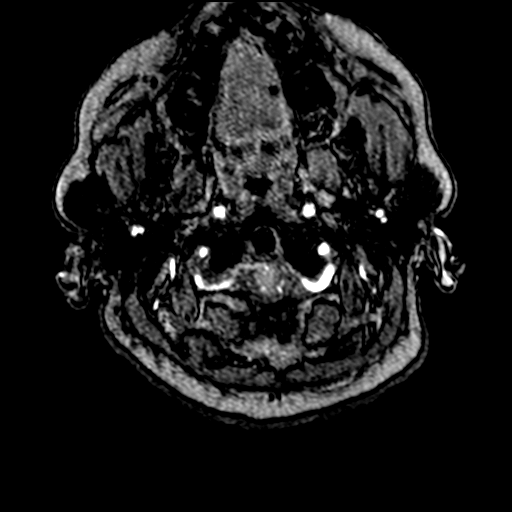
[im 15/172]
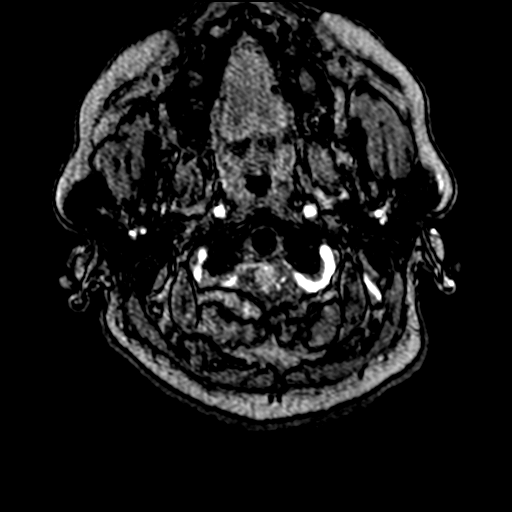
[im 19/172]
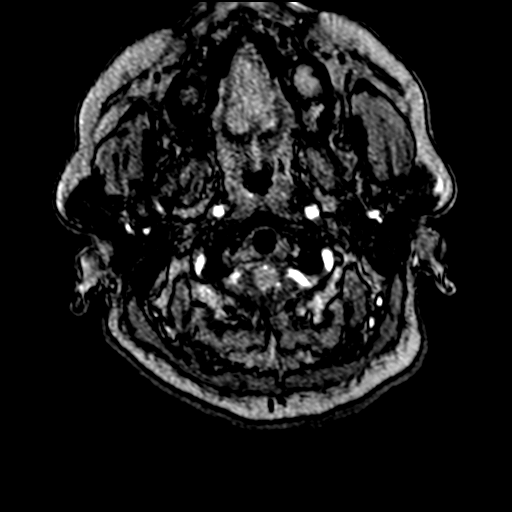
[im 23/172]
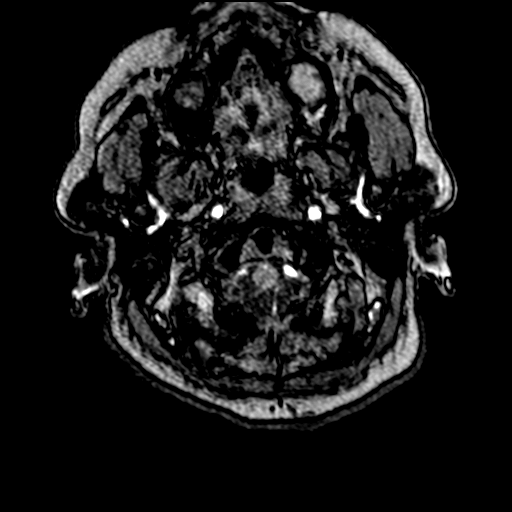
[im 27/172]
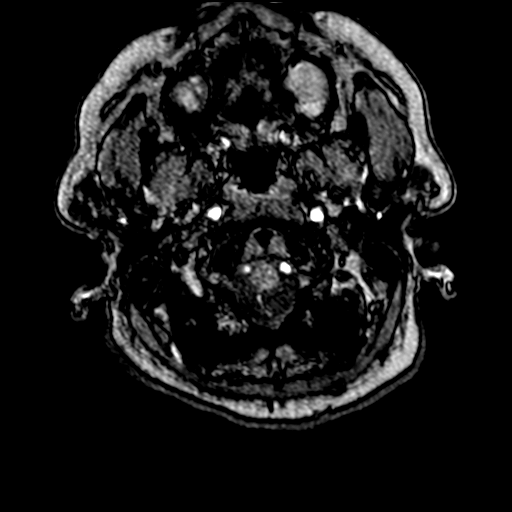
[im 30/172]
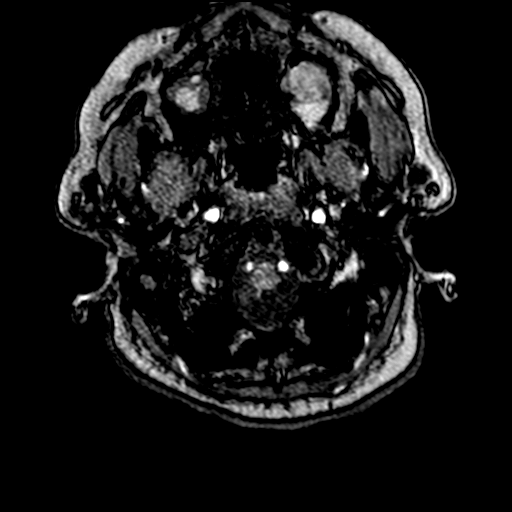
[im 53/172]
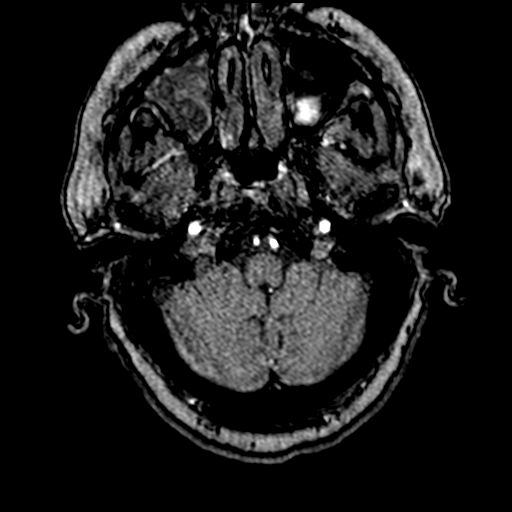
[im 75/172]
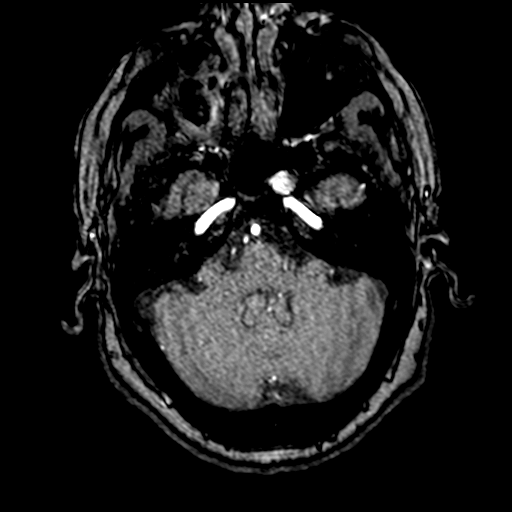
[im 86/172]
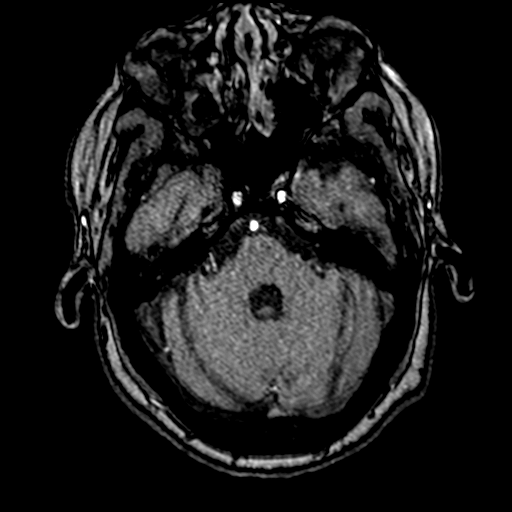
[im 97/172]
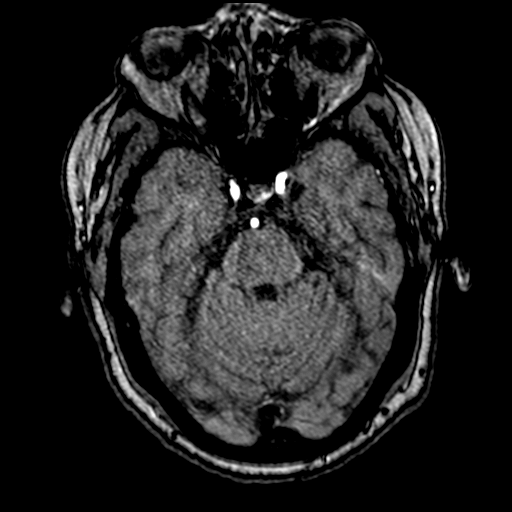
[im 119/172]
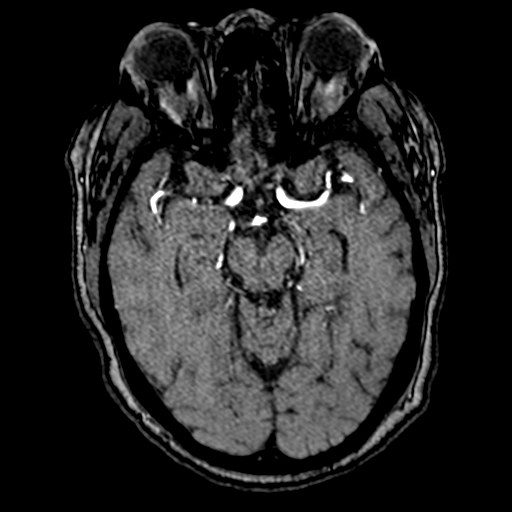
[im 142/172]
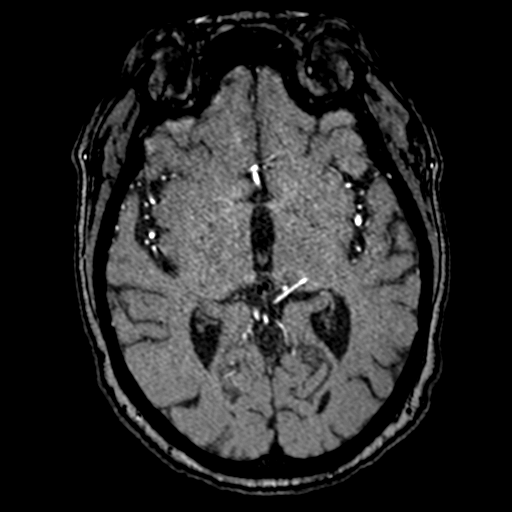
[im 145/172]
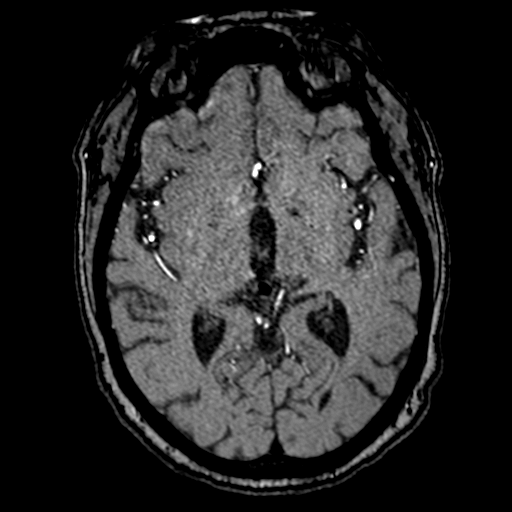
[im 164/172]
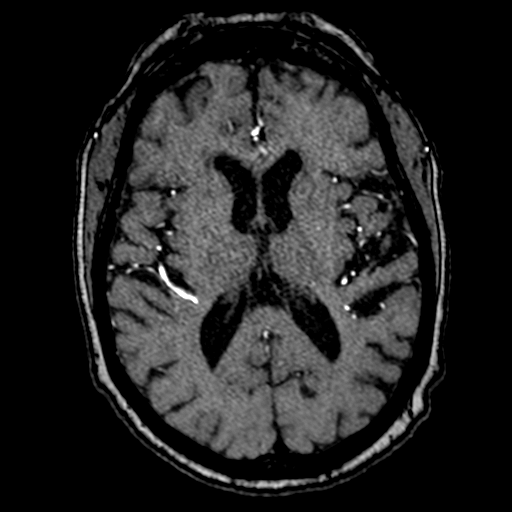

[Series 1027: mip tumble · axial · 0.4mm · 0.14mm/px · 1 of 1 slices shown]
[im 1/1]
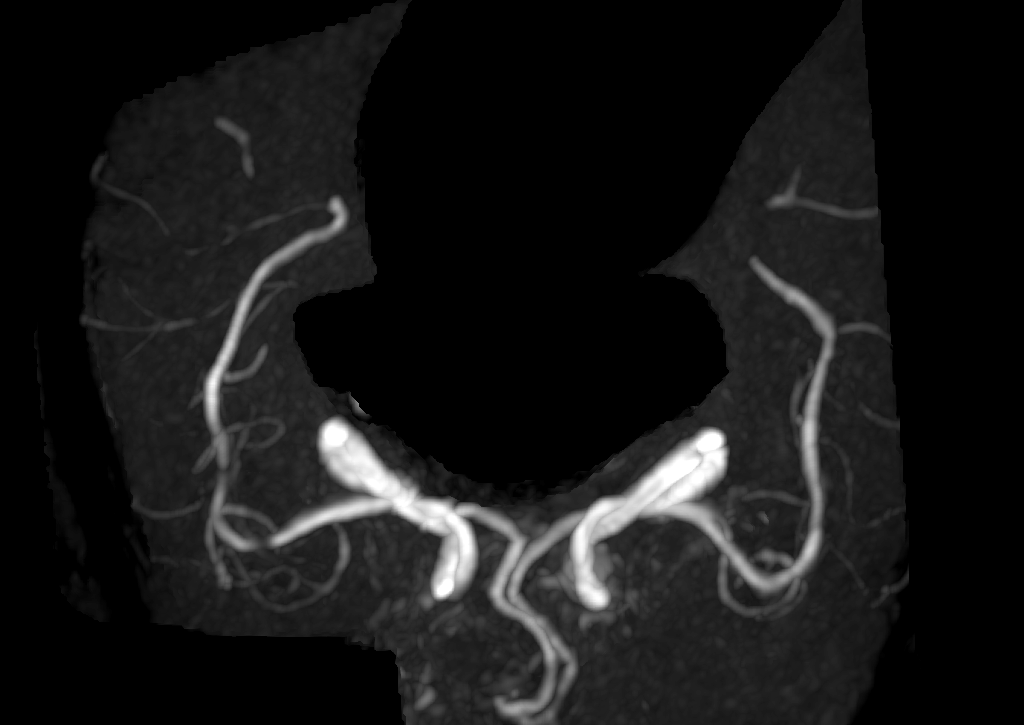

[18 of 48 positions shown; findings below may reference images not displayed]

FINDINGS: POSTERIOR CIRCULATION:

--Vertebral arteries: Normal V4 segments.

--Inferior cerebellar arteries: Normal.

--Basilar artery: Normal.

--Superior cerebellar arteries: Normal.

--Posterior cerebral arteries: Normal.

ANTERIOR CIRCULATION:

--Intracranial internal carotid arteries: Moderate-to-severe
stenosis of the distal cavernous segment of the right ICA.

--Anterior cerebral arteries (ACA): Normal. Both A1 segments are
present. Patent anterior communicating artery (a-comm).

--Middle cerebral arteries (MCA): Multifocal severe stenosis of the
right MCA including the distal M1 segment and proximal M2. Normal
left MCA.
IMPRESSION: 1. No emergent large vessel occlusion.
2. Moderate-to-severe stenosis of the distal cavernous segment of
the right ICA.
3. Multifocal severe stenosis of the right MCA including the distal
M1 segment and proximal M2.

## 2022-01-28 IMAGING — US US RENAL
1 series · 14 of 25 positions shown · non-contrast
Comparison: October 25, 2018.

CLINICAL DATA: Acute kidney failure.

EXAM:
RENAL / URINARY TRACT ULTRASOUND COMPLETE

[Series 1: us renal · 14 of 44 slices shown]
[im 1/44]
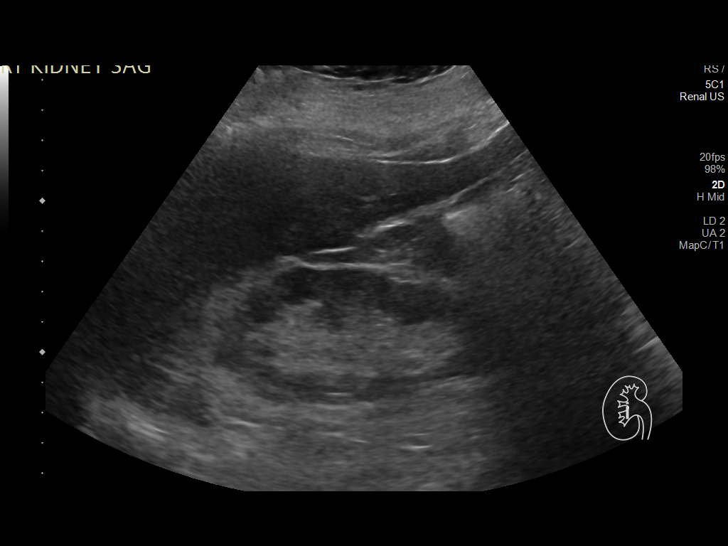
[im 4/44]
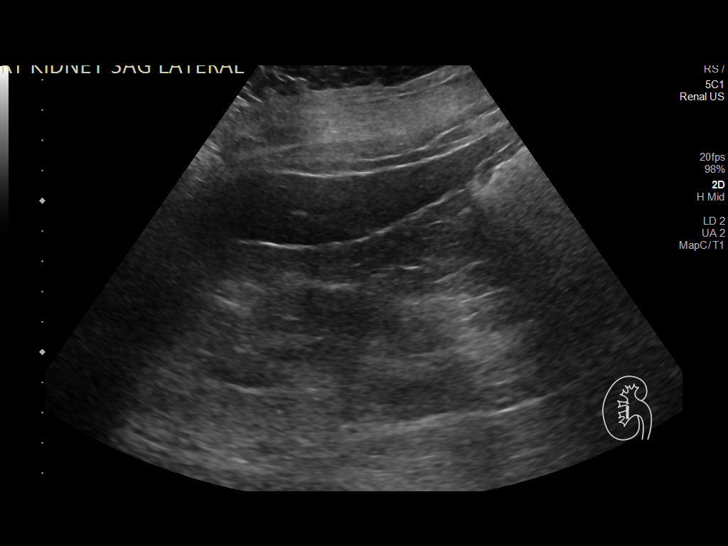
[im 8/44]
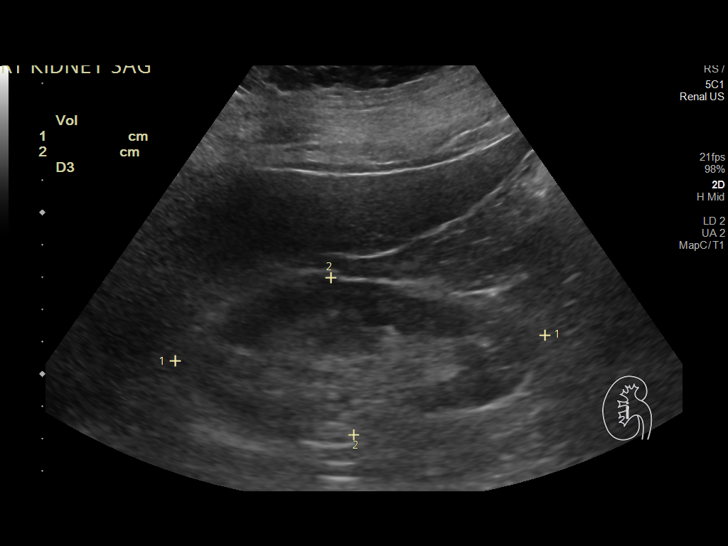
[im 11/44]
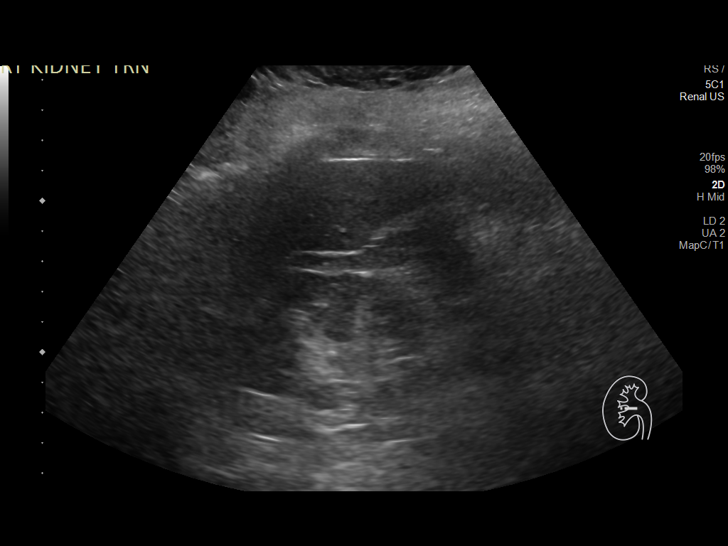
[im 15/44]
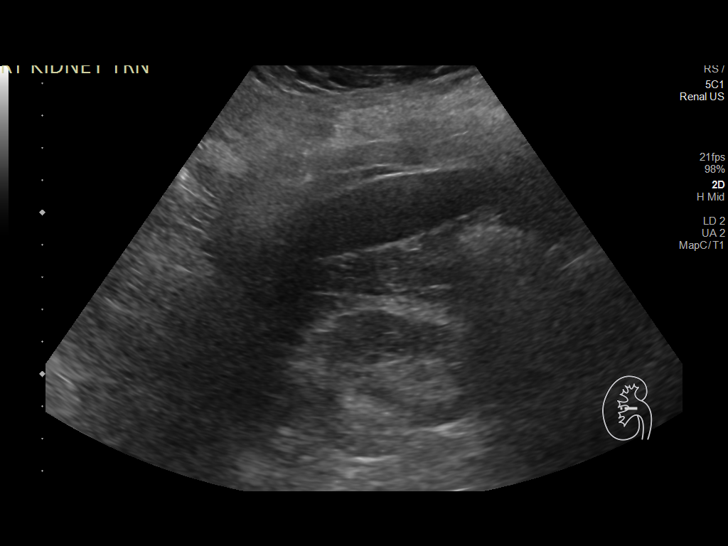
[im 17/44]
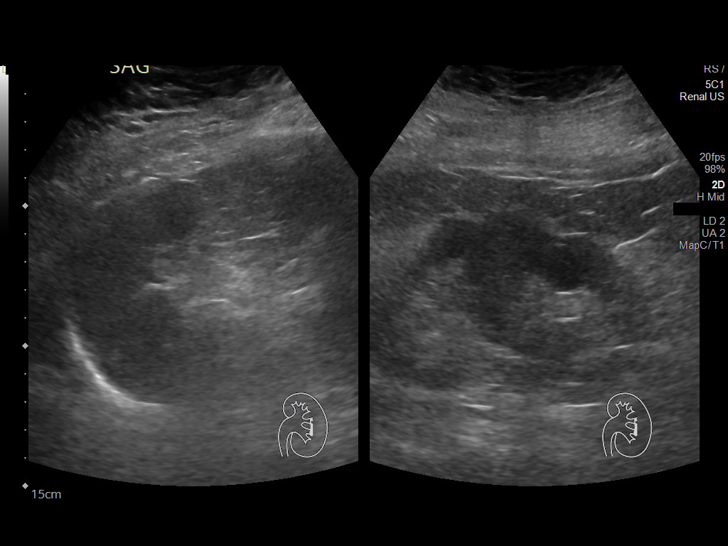
[im 20/44]
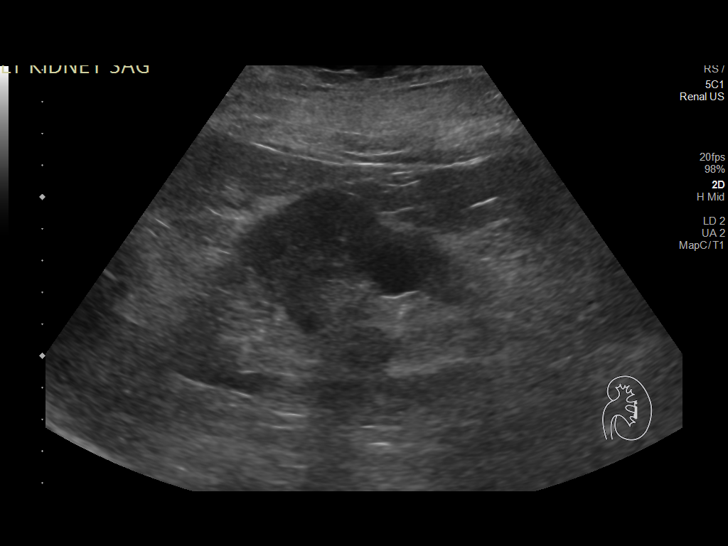
[im 24/44]
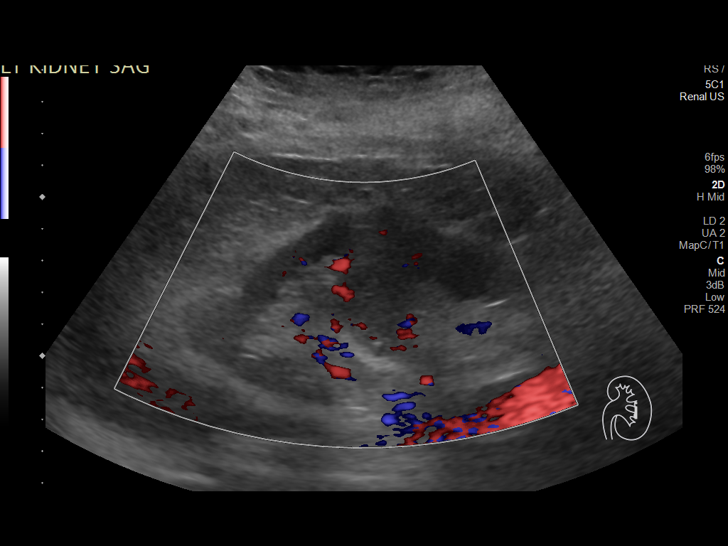
[im 27/44]
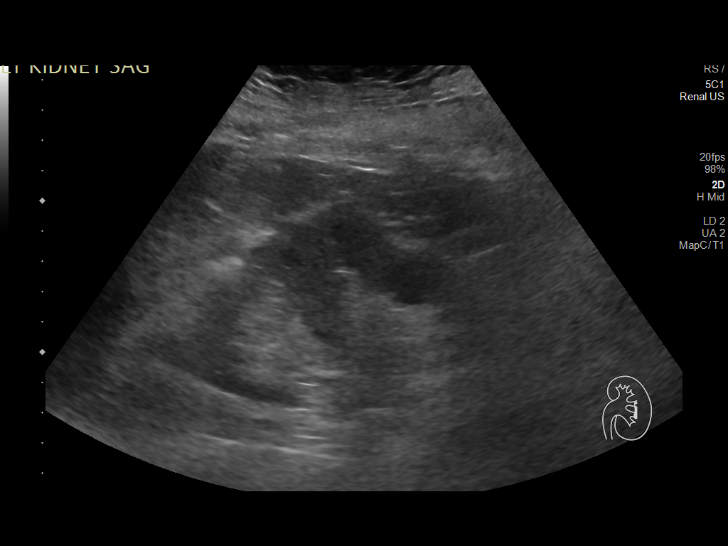
[im 29/44]
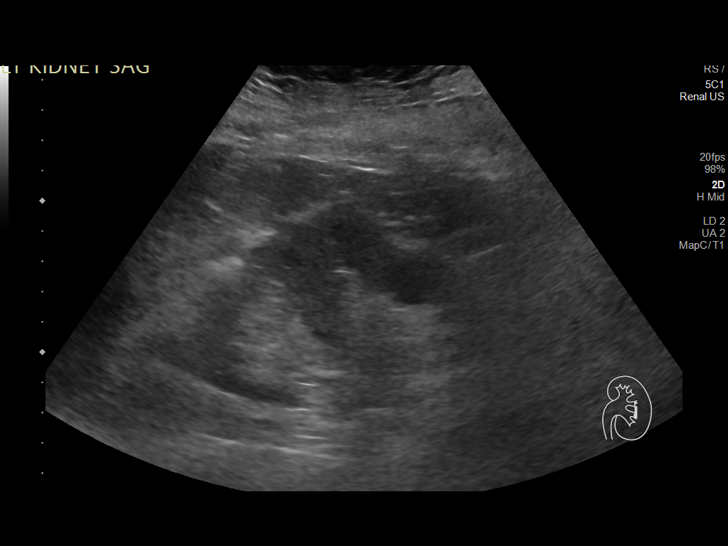
[im 33/44]
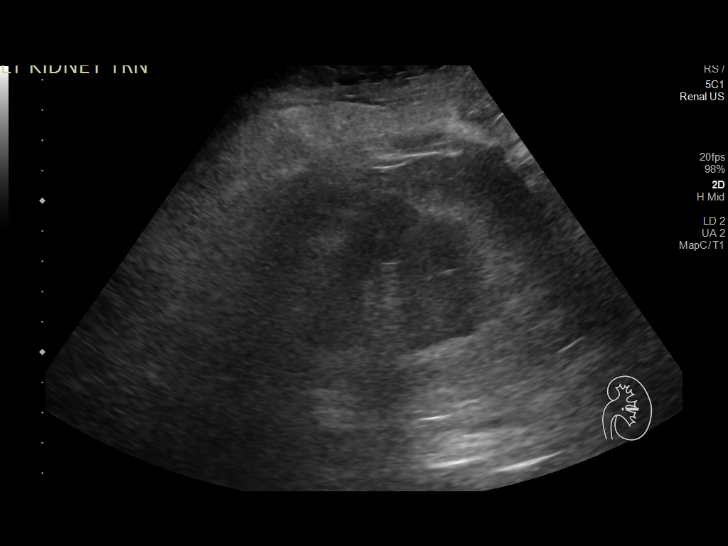
[im 36/44]
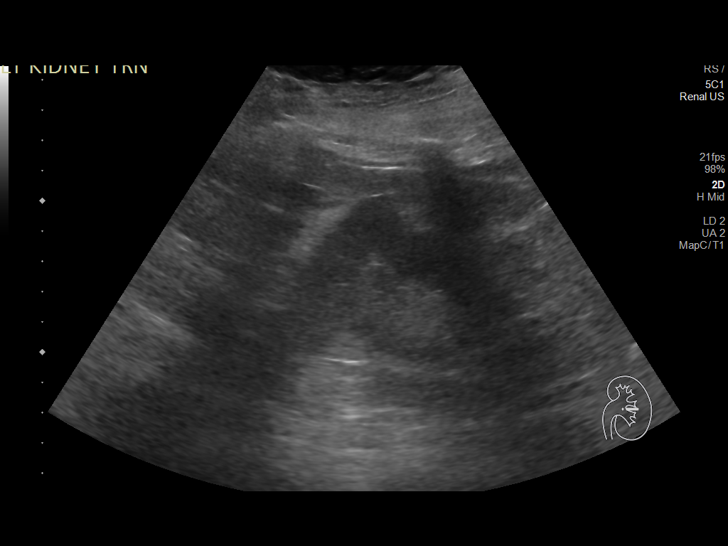
[im 40/44]
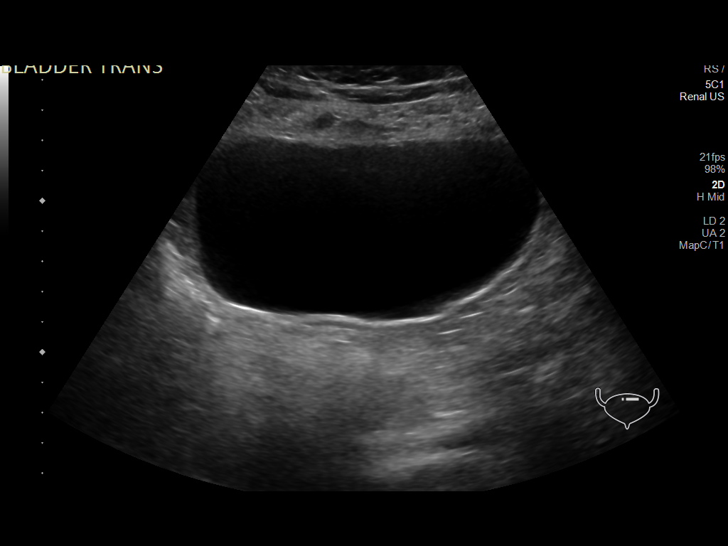
[im 44/44]
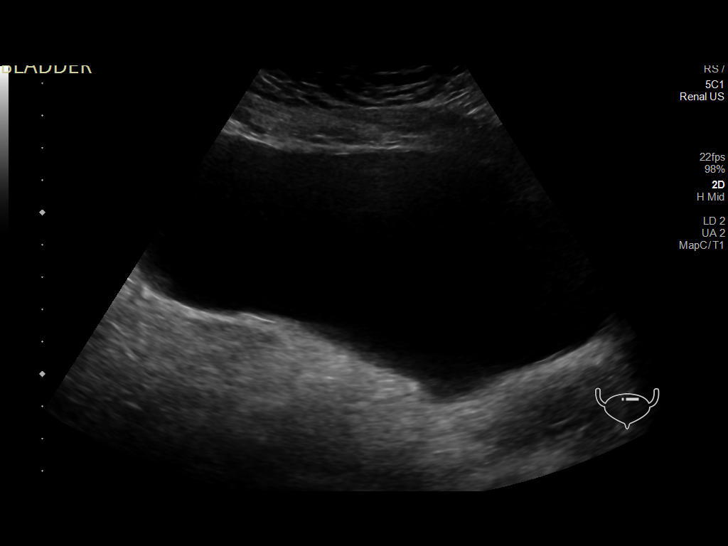

[14 of 25 positions shown; findings below may reference images not displayed]

FINDINGS: Right Kidney:

Renal measurements: 11.5 x 4.9 x 5.2 cm = volume: 154 mL .
Echogenicity within normal limits. No mass or hydronephrosis
visualized.

Left Kidney:

Renal measurements: 10.5 x 6.6 x 6.1 cm = volume: 220 mL. Prominent
column of Bertin is again noted which is normal variant.
Echogenicity within normal limits. No mass or hydronephrosis
visualized.

Bladder:

Appears normal for degree of bladder distention.

Other:

None.
IMPRESSION: No significant renal abnormality is noted.

## 2022-02-26 DIAGNOSIS — I10 Essential (primary) hypertension: Secondary | ICD-10-CM | POA: Diagnosis not present

## 2022-02-26 DIAGNOSIS — E1122 Type 2 diabetes mellitus with diabetic chronic kidney disease: Secondary | ICD-10-CM | POA: Diagnosis not present

## 2022-02-26 DIAGNOSIS — E7800 Pure hypercholesterolemia, unspecified: Secondary | ICD-10-CM | POA: Diagnosis not present

## 2022-02-26 DIAGNOSIS — E1165 Type 2 diabetes mellitus with hyperglycemia: Secondary | ICD-10-CM | POA: Diagnosis not present

## 2022-03-06 DIAGNOSIS — E1169 Type 2 diabetes mellitus with other specified complication: Secondary | ICD-10-CM | POA: Diagnosis not present

## 2022-03-06 DIAGNOSIS — E7800 Pure hypercholesterolemia, unspecified: Secondary | ICD-10-CM | POA: Diagnosis not present

## 2022-03-06 DIAGNOSIS — Z794 Long term (current) use of insulin: Secondary | ICD-10-CM | POA: Diagnosis not present

## 2022-03-06 DIAGNOSIS — I10 Essential (primary) hypertension: Secondary | ICD-10-CM | POA: Diagnosis not present

## 2022-03-06 DIAGNOSIS — Z8673 Personal history of transient ischemic attack (TIA), and cerebral infarction without residual deficits: Secondary | ICD-10-CM | POA: Diagnosis not present

## 2022-03-26 DIAGNOSIS — R4586 Emotional lability: Secondary | ICD-10-CM | POA: Diagnosis not present

## 2022-03-27 DIAGNOSIS — I10 Essential (primary) hypertension: Secondary | ICD-10-CM | POA: Diagnosis not present

## 2022-03-27 DIAGNOSIS — E1165 Type 2 diabetes mellitus with hyperglycemia: Secondary | ICD-10-CM | POA: Diagnosis not present

## 2022-03-27 DIAGNOSIS — E1122 Type 2 diabetes mellitus with diabetic chronic kidney disease: Secondary | ICD-10-CM | POA: Diagnosis not present

## 2022-03-27 DIAGNOSIS — E7800 Pure hypercholesterolemia, unspecified: Secondary | ICD-10-CM | POA: Diagnosis not present

## 2022-04-03 DIAGNOSIS — E7800 Pure hypercholesterolemia, unspecified: Secondary | ICD-10-CM | POA: Diagnosis not present

## 2022-04-03 DIAGNOSIS — Z794 Long term (current) use of insulin: Secondary | ICD-10-CM | POA: Diagnosis not present

## 2022-04-03 DIAGNOSIS — E1169 Type 2 diabetes mellitus with other specified complication: Secondary | ICD-10-CM | POA: Diagnosis not present

## 2022-04-03 DIAGNOSIS — I10 Essential (primary) hypertension: Secondary | ICD-10-CM | POA: Diagnosis not present

## 2022-04-03 DIAGNOSIS — Z8673 Personal history of transient ischemic attack (TIA), and cerebral infarction without residual deficits: Secondary | ICD-10-CM | POA: Diagnosis not present

## 2022-04-11 DIAGNOSIS — Z8673 Personal history of transient ischemic attack (TIA), and cerebral infarction without residual deficits: Secondary | ICD-10-CM | POA: Diagnosis not present

## 2022-04-11 DIAGNOSIS — E7800 Pure hypercholesterolemia, unspecified: Secondary | ICD-10-CM | POA: Diagnosis not present

## 2022-04-22 DIAGNOSIS — E1169 Type 2 diabetes mellitus with other specified complication: Secondary | ICD-10-CM | POA: Diagnosis not present

## 2022-04-22 DIAGNOSIS — E7800 Pure hypercholesterolemia, unspecified: Secondary | ICD-10-CM | POA: Diagnosis not present

## 2022-04-22 DIAGNOSIS — I10 Essential (primary) hypertension: Secondary | ICD-10-CM | POA: Diagnosis not present

## 2022-04-22 DIAGNOSIS — Z8673 Personal history of transient ischemic attack (TIA), and cerebral infarction without residual deficits: Secondary | ICD-10-CM | POA: Diagnosis not present

## 2022-04-22 DIAGNOSIS — Z794 Long term (current) use of insulin: Secondary | ICD-10-CM | POA: Diagnosis not present

## 2022-05-05 ENCOUNTER — Other Ambulatory Visit: Payer: Self-pay | Admitting: Obstetrics and Gynecology

## 2022-05-05 DIAGNOSIS — Z01419 Encounter for gynecological examination (general) (routine) without abnormal findings: Secondary | ICD-10-CM | POA: Diagnosis not present

## 2022-05-05 DIAGNOSIS — Z1231 Encounter for screening mammogram for malignant neoplasm of breast: Secondary | ICD-10-CM | POA: Diagnosis not present

## 2022-05-05 DIAGNOSIS — E1165 Type 2 diabetes mellitus with hyperglycemia: Secondary | ICD-10-CM | POA: Diagnosis not present

## 2022-05-05 DIAGNOSIS — Z6836 Body mass index (BMI) 36.0-36.9, adult: Secondary | ICD-10-CM | POA: Diagnosis not present

## 2022-05-05 DIAGNOSIS — N6310 Unspecified lump in the right breast, unspecified quadrant: Secondary | ICD-10-CM

## 2022-05-12 ENCOUNTER — Ambulatory Visit
Admission: RE | Admit: 2022-05-12 | Discharge: 2022-05-12 | Disposition: A | Discharge status: Home / Self Care | Payer: Medicare HMO | Source: Ambulatory Visit | Attending: Obstetrics and Gynecology | Admitting: Obstetrics and Gynecology

## 2022-05-12 DIAGNOSIS — N6310 Unspecified lump in the right breast, unspecified quadrant: Secondary | ICD-10-CM

## 2022-05-12 DIAGNOSIS — N6001 Solitary cyst of right breast: Secondary | ICD-10-CM | POA: Diagnosis not present

## 2022-05-12 DIAGNOSIS — R928 Other abnormal and inconclusive findings on diagnostic imaging of breast: Secondary | ICD-10-CM | POA: Diagnosis not present

## 2022-05-19 DIAGNOSIS — L723 Sebaceous cyst: Secondary | ICD-10-CM | POA: Diagnosis not present

## 2022-07-01 DIAGNOSIS — E1169 Type 2 diabetes mellitus with other specified complication: Secondary | ICD-10-CM | POA: Diagnosis not present

## 2022-07-01 DIAGNOSIS — I10 Essential (primary) hypertension: Secondary | ICD-10-CM | POA: Diagnosis not present

## 2022-07-01 DIAGNOSIS — E7800 Pure hypercholesterolemia, unspecified: Secondary | ICD-10-CM | POA: Diagnosis not present

## 2022-07-01 DIAGNOSIS — Z8673 Personal history of transient ischemic attack (TIA), and cerebral infarction without residual deficits: Secondary | ICD-10-CM | POA: Diagnosis not present

## 2022-07-01 DIAGNOSIS — Z794 Long term (current) use of insulin: Secondary | ICD-10-CM | POA: Diagnosis not present

## 2022-08-28 DIAGNOSIS — Z8673 Personal history of transient ischemic attack (TIA), and cerebral infarction without residual deficits: Secondary | ICD-10-CM | POA: Diagnosis not present

## 2022-08-28 DIAGNOSIS — E1169 Type 2 diabetes mellitus with other specified complication: Secondary | ICD-10-CM | POA: Diagnosis not present

## 2022-08-28 DIAGNOSIS — E7800 Pure hypercholesterolemia, unspecified: Secondary | ICD-10-CM | POA: Diagnosis not present

## 2022-08-28 DIAGNOSIS — I10 Essential (primary) hypertension: Secondary | ICD-10-CM | POA: Diagnosis not present

## 2022-08-28 DIAGNOSIS — Z794 Long term (current) use of insulin: Secondary | ICD-10-CM | POA: Diagnosis not present

## 2022-10-02 DIAGNOSIS — E1169 Type 2 diabetes mellitus with other specified complication: Secondary | ICD-10-CM | POA: Diagnosis not present

## 2022-10-02 DIAGNOSIS — E1165 Type 2 diabetes mellitus with hyperglycemia: Secondary | ICD-10-CM | POA: Diagnosis not present

## 2022-10-02 DIAGNOSIS — Z8673 Personal history of transient ischemic attack (TIA), and cerebral infarction without residual deficits: Secondary | ICD-10-CM | POA: Diagnosis not present

## 2022-10-02 DIAGNOSIS — I10 Essential (primary) hypertension: Secondary | ICD-10-CM | POA: Diagnosis not present

## 2022-10-02 DIAGNOSIS — E7800 Pure hypercholesterolemia, unspecified: Secondary | ICD-10-CM | POA: Diagnosis not present

## 2022-10-02 DIAGNOSIS — Z794 Long term (current) use of insulin: Secondary | ICD-10-CM | POA: Diagnosis not present

## 2022-10-16 DIAGNOSIS — R519 Headache, unspecified: Secondary | ICD-10-CM | POA: Diagnosis not present

## 2022-10-16 DIAGNOSIS — E1140 Type 2 diabetes mellitus with diabetic neuropathy, unspecified: Secondary | ICD-10-CM | POA: Diagnosis not present

## 2022-10-16 DIAGNOSIS — N184 Chronic kidney disease, stage 4 (severe): Secondary | ICD-10-CM | POA: Diagnosis not present

## 2022-10-21 DIAGNOSIS — E1169 Type 2 diabetes mellitus with other specified complication: Secondary | ICD-10-CM | POA: Diagnosis not present

## 2022-10-21 DIAGNOSIS — I10 Essential (primary) hypertension: Secondary | ICD-10-CM | POA: Diagnosis not present

## 2022-10-21 DIAGNOSIS — Z8673 Personal history of transient ischemic attack (TIA), and cerebral infarction without residual deficits: Secondary | ICD-10-CM | POA: Diagnosis not present

## 2022-10-21 DIAGNOSIS — Z794 Long term (current) use of insulin: Secondary | ICD-10-CM | POA: Diagnosis not present

## 2022-10-21 DIAGNOSIS — E1165 Type 2 diabetes mellitus with hyperglycemia: Secondary | ICD-10-CM | POA: Diagnosis not present

## 2022-10-21 DIAGNOSIS — E7800 Pure hypercholesterolemia, unspecified: Secondary | ICD-10-CM | POA: Diagnosis not present

## 2022-11-20 DIAGNOSIS — Z794 Long term (current) use of insulin: Secondary | ICD-10-CM | POA: Diagnosis not present

## 2022-11-20 DIAGNOSIS — Z8673 Personal history of transient ischemic attack (TIA), and cerebral infarction without residual deficits: Secondary | ICD-10-CM | POA: Diagnosis not present

## 2022-11-20 DIAGNOSIS — I10 Essential (primary) hypertension: Secondary | ICD-10-CM | POA: Diagnosis not present

## 2022-11-20 DIAGNOSIS — E7800 Pure hypercholesterolemia, unspecified: Secondary | ICD-10-CM | POA: Diagnosis not present

## 2022-11-20 DIAGNOSIS — R7989 Other specified abnormal findings of blood chemistry: Secondary | ICD-10-CM | POA: Diagnosis not present

## 2022-11-20 DIAGNOSIS — E1169 Type 2 diabetes mellitus with other specified complication: Secondary | ICD-10-CM | POA: Diagnosis not present

## 2022-11-24 DIAGNOSIS — R7989 Other specified abnormal findings of blood chemistry: Secondary | ICD-10-CM | POA: Diagnosis not present

## 2022-12-18 DIAGNOSIS — E7800 Pure hypercholesterolemia, unspecified: Secondary | ICD-10-CM | POA: Diagnosis not present

## 2022-12-18 DIAGNOSIS — Z794 Long term (current) use of insulin: Secondary | ICD-10-CM | POA: Diagnosis not present

## 2022-12-18 DIAGNOSIS — I10 Essential (primary) hypertension: Secondary | ICD-10-CM | POA: Diagnosis not present

## 2022-12-18 DIAGNOSIS — Z8673 Personal history of transient ischemic attack (TIA), and cerebral infarction without residual deficits: Secondary | ICD-10-CM | POA: Diagnosis not present

## 2022-12-18 DIAGNOSIS — E1169 Type 2 diabetes mellitus with other specified complication: Secondary | ICD-10-CM | POA: Diagnosis not present

## 2022-12-18 DIAGNOSIS — E1165 Type 2 diabetes mellitus with hyperglycemia: Secondary | ICD-10-CM | POA: Diagnosis not present

## 2023-01-07 ENCOUNTER — Institutional Professional Consult (permissible substitution): Payer: Medicare HMO | Admitting: Neurology

## 2023-01-22 DIAGNOSIS — J019 Acute sinusitis, unspecified: Secondary | ICD-10-CM | POA: Diagnosis not present

## 2023-01-22 DIAGNOSIS — R059 Cough, unspecified: Secondary | ICD-10-CM | POA: Diagnosis not present

## 2023-03-05 DIAGNOSIS — I10 Essential (primary) hypertension: Secondary | ICD-10-CM | POA: Diagnosis not present

## 2023-03-05 DIAGNOSIS — Z794 Long term (current) use of insulin: Secondary | ICD-10-CM | POA: Diagnosis not present

## 2023-03-05 DIAGNOSIS — E1169 Type 2 diabetes mellitus with other specified complication: Secondary | ICD-10-CM | POA: Diagnosis not present

## 2023-03-05 DIAGNOSIS — E7800 Pure hypercholesterolemia, unspecified: Secondary | ICD-10-CM | POA: Diagnosis not present

## 2023-03-05 DIAGNOSIS — Z8673 Personal history of transient ischemic attack (TIA), and cerebral infarction without residual deficits: Secondary | ICD-10-CM | POA: Diagnosis not present

## 2023-04-23 DIAGNOSIS — R519 Headache, unspecified: Secondary | ICD-10-CM | POA: Diagnosis not present

## 2023-04-23 DIAGNOSIS — I10 Essential (primary) hypertension: Secondary | ICD-10-CM | POA: Diagnosis not present

## 2023-04-23 DIAGNOSIS — E7800 Pure hypercholesterolemia, unspecified: Secondary | ICD-10-CM | POA: Diagnosis not present

## 2023-04-23 DIAGNOSIS — E1169 Type 2 diabetes mellitus with other specified complication: Secondary | ICD-10-CM | POA: Diagnosis not present

## 2023-04-23 DIAGNOSIS — Z794 Long term (current) use of insulin: Secondary | ICD-10-CM | POA: Diagnosis not present

## 2023-04-23 DIAGNOSIS — Z8673 Personal history of transient ischemic attack (TIA), and cerebral infarction without residual deficits: Secondary | ICD-10-CM | POA: Diagnosis not present

## 2023-04-23 DIAGNOSIS — E1140 Type 2 diabetes mellitus with diabetic neuropathy, unspecified: Secondary | ICD-10-CM | POA: Diagnosis not present

## 2023-05-28 ENCOUNTER — Emergency Department (HOSPITAL_COMMUNITY)

## 2023-05-28 ENCOUNTER — Encounter (HOSPITAL_COMMUNITY): Payer: Self-pay

## 2023-05-28 ENCOUNTER — Other Ambulatory Visit: Payer: Self-pay

## 2023-05-28 ENCOUNTER — Observation Stay (HOSPITAL_COMMUNITY)
Admission: EM | Admit: 2023-05-28 | Discharge: 2023-05-30 | Disposition: A | Discharge status: Home / Self Care | Attending: Emergency Medicine | Admitting: Emergency Medicine

## 2023-05-28 DIAGNOSIS — I69328 Other speech and language deficits following cerebral infarction: Secondary | ICD-10-CM | POA: Diagnosis not present

## 2023-05-28 DIAGNOSIS — I69354 Hemiplegia and hemiparesis following cerebral infarction affecting left non-dominant side: Secondary | ICD-10-CM | POA: Insufficient documentation

## 2023-05-28 DIAGNOSIS — I700 Atherosclerosis of aorta: Secondary | ICD-10-CM | POA: Diagnosis not present

## 2023-05-28 DIAGNOSIS — G4733 Obstructive sleep apnea (adult) (pediatric): Secondary | ICD-10-CM | POA: Diagnosis present

## 2023-05-28 DIAGNOSIS — I10 Essential (primary) hypertension: Secondary | ICD-10-CM | POA: Diagnosis not present

## 2023-05-28 DIAGNOSIS — N184 Chronic kidney disease, stage 4 (severe): Secondary | ICD-10-CM | POA: Diagnosis present

## 2023-05-28 DIAGNOSIS — R748 Abnormal levels of other serum enzymes: Secondary | ICD-10-CM | POA: Diagnosis not present

## 2023-05-28 DIAGNOSIS — Z79899 Other long term (current) drug therapy: Secondary | ICD-10-CM | POA: Insufficient documentation

## 2023-05-28 DIAGNOSIS — G9389 Other specified disorders of brain: Secondary | ICD-10-CM | POA: Diagnosis not present

## 2023-05-28 DIAGNOSIS — N189 Chronic kidney disease, unspecified: Secondary | ICD-10-CM | POA: Diagnosis not present

## 2023-05-28 DIAGNOSIS — Z7982 Long term (current) use of aspirin: Secondary | ICD-10-CM | POA: Insufficient documentation

## 2023-05-28 DIAGNOSIS — Z7902 Long term (current) use of antithrombotics/antiplatelets: Secondary | ICD-10-CM | POA: Diagnosis not present

## 2023-05-28 DIAGNOSIS — K5730 Diverticulosis of large intestine without perforation or abscess without bleeding: Secondary | ICD-10-CM | POA: Diagnosis not present

## 2023-05-28 DIAGNOSIS — N281 Cyst of kidney, acquired: Secondary | ICD-10-CM | POA: Diagnosis not present

## 2023-05-28 DIAGNOSIS — I129 Hypertensive chronic kidney disease with stage 1 through stage 4 chronic kidney disease, or unspecified chronic kidney disease: Secondary | ICD-10-CM | POA: Insufficient documentation

## 2023-05-28 DIAGNOSIS — E1122 Type 2 diabetes mellitus with diabetic chronic kidney disease: Secondary | ICD-10-CM | POA: Insufficient documentation

## 2023-05-28 DIAGNOSIS — I6782 Cerebral ischemia: Secondary | ICD-10-CM | POA: Diagnosis not present

## 2023-05-28 DIAGNOSIS — R519 Headache, unspecified: Secondary | ICD-10-CM | POA: Diagnosis present

## 2023-05-28 DIAGNOSIS — E785 Hyperlipidemia, unspecified: Secondary | ICD-10-CM | POA: Insufficient documentation

## 2023-05-28 DIAGNOSIS — Z7985 Long-term (current) use of injectable non-insulin antidiabetic drugs: Secondary | ICD-10-CM | POA: Insufficient documentation

## 2023-05-28 DIAGNOSIS — Z9889 Other specified postprocedural states: Secondary | ICD-10-CM | POA: Diagnosis not present

## 2023-05-28 DIAGNOSIS — N179 Acute kidney failure, unspecified: Secondary | ICD-10-CM | POA: Diagnosis not present

## 2023-05-28 DIAGNOSIS — Z794 Long term (current) use of insulin: Secondary | ICD-10-CM | POA: Insufficient documentation

## 2023-05-28 DIAGNOSIS — R7401 Elevation of levels of liver transaminase levels: Secondary | ICD-10-CM | POA: Insufficient documentation

## 2023-05-28 DIAGNOSIS — K5790 Diverticulosis of intestine, part unspecified, without perforation or abscess without bleeding: Secondary | ICD-10-CM | POA: Insufficient documentation

## 2023-05-28 DIAGNOSIS — D3501 Benign neoplasm of right adrenal gland: Secondary | ICD-10-CM | POA: Diagnosis not present

## 2023-05-28 DIAGNOSIS — J320 Chronic maxillary sinusitis: Secondary | ICD-10-CM | POA: Diagnosis not present

## 2023-05-28 DIAGNOSIS — G319 Degenerative disease of nervous system, unspecified: Secondary | ICD-10-CM | POA: Diagnosis not present

## 2023-05-28 DIAGNOSIS — R531 Weakness: Secondary | ICD-10-CM | POA: Diagnosis not present

## 2023-05-28 DIAGNOSIS — E1169 Type 2 diabetes mellitus with other specified complication: Secondary | ICD-10-CM | POA: Diagnosis not present

## 2023-05-28 DIAGNOSIS — I6300 Cerebral infarction due to thrombosis of unspecified precerebral artery: Secondary | ICD-10-CM | POA: Diagnosis not present

## 2023-05-28 DIAGNOSIS — Z8673 Personal history of transient ischemic attack (TIA), and cerebral infarction without residual deficits: Secondary | ICD-10-CM | POA: Insufficient documentation

## 2023-05-28 DIAGNOSIS — Z85520 Personal history of malignant carcinoid tumor of kidney: Secondary | ICD-10-CM | POA: Insufficient documentation

## 2023-05-28 DIAGNOSIS — I6523 Occlusion and stenosis of bilateral carotid arteries: Secondary | ICD-10-CM | POA: Diagnosis not present

## 2023-05-28 DIAGNOSIS — Z7901 Long term (current) use of anticoagulants: Secondary | ICD-10-CM | POA: Insufficient documentation

## 2023-05-28 DIAGNOSIS — I639 Cerebral infarction, unspecified: Secondary | ICD-10-CM | POA: Diagnosis present

## 2023-05-28 LAB — CBC WITH DIFFERENTIAL/PLATELET
Abs Immature Granulocytes: 0.08 10*3/uL — ABNORMAL HIGH (ref 0.00–0.07)
Basophils Absolute: 0 10*3/uL (ref 0.0–0.1)
Basophils Relative: 1 %
Eosinophils Absolute: 0 10*3/uL (ref 0.0–0.5)
Eosinophils Relative: 0 %
HCT: 35.1 % — ABNORMAL LOW (ref 36.0–46.0)
Hemoglobin: 11.1 g/dL — ABNORMAL LOW (ref 12.0–15.0)
Immature Granulocytes: 1 %
Lymphocytes Relative: 10 %
Lymphs Abs: 0.6 10*3/uL — ABNORMAL LOW (ref 0.7–4.0)
MCH: 29.9 pg (ref 26.0–34.0)
MCHC: 31.6 g/dL (ref 30.0–36.0)
MCV: 94.6 fL (ref 80.0–100.0)
Monocytes Absolute: 0.5 10*3/uL (ref 0.1–1.0)
Monocytes Relative: 9 %
Neutro Abs: 4.8 10*3/uL (ref 1.7–7.7)
Neutrophils Relative %: 79 %
Platelets: 201 10*3/uL (ref 150–400)
RBC: 3.71 MIL/uL — ABNORMAL LOW (ref 3.87–5.11)
RDW: 14.2 % (ref 11.5–15.5)
WBC: 6.1 10*3/uL (ref 4.0–10.5)
nRBC: 0 % (ref 0.0–0.2)

## 2023-05-28 LAB — ACETAMINOPHEN LEVEL: Acetaminophen (Tylenol), Serum: 32 ug/mL — ABNORMAL HIGH (ref 10–30)

## 2023-05-28 LAB — COMPREHENSIVE METABOLIC PANEL WITH GFR
ALT: 391 U/L — ABNORMAL HIGH (ref 0–44)
AST: 470 U/L — ABNORMAL HIGH (ref 15–41)
Albumin: 3.3 g/dL — ABNORMAL LOW (ref 3.5–5.0)
Alkaline Phosphatase: 122 U/L (ref 38–126)
Anion gap: 12 (ref 5–15)
BUN: 60 mg/dL — ABNORMAL HIGH (ref 8–23)
CO2: 18 mmol/L — ABNORMAL LOW (ref 22–32)
Calcium: 9.6 mg/dL (ref 8.9–10.3)
Chloride: 102 mmol/L (ref 98–111)
Creatinine, Ser: 3.63 mg/dL — ABNORMAL HIGH (ref 0.44–1.00)
GFR, Estimated: 13 mL/min — ABNORMAL LOW (ref >60)
Glucose, Bld: 168 mg/dL — ABNORMAL HIGH (ref 70–99)
Potassium: 4.4 mmol/L (ref 3.5–5.1)
Sodium: 132 mmol/L — ABNORMAL LOW (ref 135–145)
Total Bilirubin: 0.6 mg/dL (ref 0.0–1.2)
Total Protein: 7.3 g/dL (ref 6.5–8.1)

## 2023-05-28 LAB — LACTIC ACID, PLASMA: Lactic Acid, Venous: 1.6 mmol/L (ref 0.5–1.9)

## 2023-05-28 LAB — RESP PANEL BY RT-PCR (RSV, FLU A&B, COVID)  RVPGX2
Influenza A by PCR: NEGATIVE
Influenza B by PCR: NEGATIVE
Resp Syncytial Virus by PCR: NEGATIVE
SARS Coronavirus 2 by RT PCR: NEGATIVE

## 2023-05-28 LAB — SALICYLATE LEVEL: Salicylate Lvl: <7 mg/dL — ABNORMAL LOW (ref 7.0–30.0)

## 2023-05-28 LAB — GLUCOSE, CAPILLARY: Glucose-Capillary: 133 mg/dL — ABNORMAL HIGH (ref 70–99)

## 2023-05-28 LAB — CBG MONITORING, ED: Glucose-Capillary: 142 mg/dL — ABNORMAL HIGH (ref 70–99)

## 2023-05-28 MED ORDER — INSULIN GLARGINE-YFGN 100 UNIT/ML ~~LOC~~ SOLN
10.0000 [IU] | SUBCUTANEOUS | Status: DC
  Administered 2023-05-29: 10 [IU] via SUBCUTANEOUS
  Filled 2023-05-28 (×3): qty 0.1

## 2023-05-28 MED ORDER — METOCLOPRAMIDE HCL 5 MG/ML IJ SOLN
10.0000 mg | Freq: Once | INTRAMUSCULAR | Status: AC
  Administered 2023-05-28: 10 mg via INTRAVENOUS
  Filled 2023-05-28: qty 2

## 2023-05-28 MED ORDER — INSULIN ASPART 100 UNIT/ML IJ SOLN: 0.0000 [IU] | Freq: Every day | INTRAMUSCULAR | Status: DC

## 2023-05-28 MED ORDER — ONDANSETRON HCL 4 MG PO TABS: 4.0000 mg | ORAL_TABLET | Freq: Four times a day (QID) | ORAL | Status: DC | PRN

## 2023-05-28 MED ORDER — INSULIN ASPART 100 UNIT/ML IJ SOLN
0.0000 [IU] | Freq: Three times a day (TID) | INTRAMUSCULAR | Status: DC
  Administered 2023-05-29: 1 [IU] via SUBCUTANEOUS
  Administered 2023-05-30: 2 [IU] via SUBCUTANEOUS

## 2023-05-28 MED ORDER — CLONIDINE HCL 0.1 MG PO TABS
0.1500 mg | ORAL_TABLET | Freq: Two times a day (BID) | ORAL | Status: DC
Start: 2023-05-29 — End: 2023-05-30
  Administered 2023-05-29 – 2023-05-30 (×3): 0.15 mg via ORAL
  Filled 2023-05-28 (×3): qty 2

## 2023-05-28 MED ORDER — DIPHENHYDRAMINE HCL 50 MG/ML IJ SOLN
25.0000 mg | Freq: Once | INTRAMUSCULAR | Status: AC
  Administered 2023-05-28: 25 mg via INTRAVENOUS
  Filled 2023-05-28: qty 1

## 2023-05-28 MED ORDER — LACTATED RINGERS IV BOLUS
1000.0000 mL | Freq: Once | INTRAVENOUS | Status: AC
  Administered 2023-05-28: 1000 mL via INTRAVENOUS

## 2023-05-28 MED ORDER — OXYCODONE HCL 5 MG PO TABS
5.0000 mg | ORAL_TABLET | Freq: Four times a day (QID) | ORAL | Status: DC | PRN
  Administered 2023-05-28 – 2023-05-29 (×2): 5 mg via ORAL
  Filled 2023-05-28 (×2): qty 1

## 2023-05-28 MED ORDER — ASPIRIN 81 MG PO CHEW
81.0000 mg | CHEWABLE_TABLET | Freq: Every day | ORAL | Status: DC
  Administered 2023-05-29 – 2023-05-30 (×2): 81 mg via ORAL
  Filled 2023-05-28 (×2): qty 1

## 2023-05-28 MED ORDER — ONDANSETRON HCL 4 MG/2ML IJ SOLN
4.0000 mg | Freq: Four times a day (QID) | INTRAMUSCULAR | Status: DC | PRN
Start: 2023-05-28 — End: 2023-05-30
  Administered 2023-05-28: 4 mg via INTRAVENOUS
  Filled 2023-05-28: qty 2

## 2023-05-28 MED ORDER — CLOPIDOGREL BISULFATE 75 MG PO TABS
75.0000 mg | ORAL_TABLET | Freq: Every day | ORAL | Status: DC
  Administered 2023-05-29 – 2023-05-30 (×2): 75 mg via ORAL
  Filled 2023-05-28 (×2): qty 1

## 2023-05-28 MED ORDER — SODIUM CHLORIDE 0.9 % IV SOLN: INTRAVENOUS | Status: AC

## 2023-05-28 MED ORDER — FLUTICASONE PROPIONATE 50 MCG/ACT NA SUSP
2.0000 | Freq: Two times a day (BID) | NASAL | Status: DC | PRN
  Administered 2023-05-28 – 2023-05-29 (×2): 2 via NASAL
  Filled 2023-05-28 (×2): qty 16

## 2023-05-28 MED ORDER — POLYETHYLENE GLYCOL 3350 17 G PO PACK
17.0000 g | PACK | Freq: Every day | ORAL | Status: DC
  Filled 2023-05-28: qty 1

## 2023-05-28 MED ORDER — HEPARIN SODIUM (PORCINE) 5000 UNIT/ML IJ SOLN
5000.0000 [IU] | Freq: Three times a day (TID) | INTRAMUSCULAR | Status: DC
  Administered 2023-05-29 – 2023-05-30 (×4): 5000 [IU] via SUBCUTANEOUS
  Filled 2023-05-28 (×4): qty 1

## 2023-05-28 MED ORDER — CARVEDILOL 12.5 MG PO TABS
25.0000 mg | ORAL_TABLET | Freq: Two times a day (BID) | ORAL | Status: DC
  Administered 2023-05-29 – 2023-05-30 (×3): 25 mg via ORAL
  Filled 2023-05-28 (×3): qty 2

## 2023-05-28 NOTE — H&P (Signed)
 History and Physical    Briana Garrison ZOX:[id number] DOB: [birth year] DOA: 05/28/2023  PCP: Imelda Man, MD   Patient coming from: Home  I have personally briefly reviewed patient's old medical records in [facility]  Chief Complaint: Weakness  HPI: Briana Garrison is a 73 y.o. female with medical history significant for strokes, diabetes mellitus, hypertension, CKD 4. Patient was brought to the ED with complaints of generalized weakness over the past few weeks and headaches.  Patient has residual deficits from prior stroke including slurred speech, weakness to the left side, and tingling to the left side which is unchanged from baseline.  No abdominal tenderness.  She reports nausea without vomiting.  She reports poor oral intake.  Denies urinary symptoms.  She denies taking more than 2 Tylenol  every night.   ED Course: Temperature 97.6.  Heart rate 95-105.  Respiratory rate 18-28.  Blood pressure systolic 92-134.  O2 sat greater than 95% on room air. Cr- 3.63.  AST 470, ALT 391. Head CT-no acute abnormality, chronic abnormalities.  Marked severity of right-sided paranasal sinus disease. MRI brain without contrast-chronic abnormalities, moderately advanced chronic microvascular ischemic disease.  Again extensive chronic paranasal sinus disease. CT abdomen and pelvis without contrast-questionable low-grade diverticulitis-junction of descending and sigmoid colon.  Mild bilateral nonobstructive nephrolithiasis. 2 L bolus given. Hospitalist admit for AKI on CKD 4, elevated liver enzymes, possible diverticulitis  Review of Systems: As per HPI all other systems reviewed and negative.  Past Medical History:  Diagnosis Date   Cancer of kidney (HCC) 05/14/2009   Diabetes mellitus    Endometrial mass    GERD (gastroesophageal reflux disease)    Hypercholesterolemia    Hypertension    Internal hemorrhoids    S/P colonoscopy 06/10/00 by Dr. Alethia Huxley   Shingles    Stroke [facility]) 09/2019   2  strokes    Past Surgical History:  Procedure Laterality Date   BIOPSY  09/06/2018   Procedure: BIOPSY;  Surgeon: Ruby Corporal, MD;  [facility];  Service: Endoscopy;;   CHOLECYSTECTOMY  11/13/08   COLONOSCOPY N/A 09/06/2018   Procedure: COLONOSCOPY;  Surgeon: Ruby Corporal, MD;  Location: [facility];  Service: Endoscopy;  Laterality: N/A;  730   CYSTOSCOPY KIDNEY W/ URETERAL GUIDE WIRE  05/14/09   Diagnostic hysteroscopy, D&C, endometrial polypectomy  07/01/2005   LIPECTOMY     POLYPECTOMY  09/06/2018   Procedure: POLYPECTOMY;  Surgeon: Ruby Corporal, MD;  Location: [facility];  Service: Endoscopy;;     reports that she has never smoked. She has never been exposed to tobacco smoke. She has never used smokeless tobacco. She reports that she does not drink alcohol and does not use drugs.  Allergies  Allergen Reactions   Amlodipine  Swelling    Edema   Tramadol  Diarrhea, Nausea And Vomiting and Dermatitis   Acetaminophen  Other (See Comments)    Liver enzymes    Ibuprofen Other (See Comments)    Renal function     Family History  Problem Relation Age of Onset   Ovarian cancer Mother    Stroke Mother    Stroke Father    Stroke Brother    Stroke Sister     Prior to Admission medications   Medication Sig Start Date End Date Taking? Authorizing Provider  acetaminophen  (TYLENOL ) 500 MG tablet Take 500 mg by mouth every 6 (six) hours as needed for mild pain or headache.    Yes [provider]  aspirin  81 MG chewable tablet Chew 1 tablet (81 mg total) by mouth daily. Patient taking differently: Chew 81 mg by mouth in the morning and at bedtime. 03/21/21  Yes Akula, Vijaya, MD  butalbital-acetaminophen -caffeine (FIORICET) 50-325-40 MG tablet Take 1 tablet by mouth every 4 (four) hours as needed for headache or migraine.   Yes [provider]  carvedilol  (COREG ) 25 MG tablet Take 1 tablet (25 mg total) by mouth 2 (two) times daily. Patient  taking differently: Take 25 mg by mouth 2 (two) times daily. Take 1/2 tablet twice daily 01/04/21  Yes Justina Oman, MD  Cholecalciferol  (VITAMIN D -3) 25 MCG (1000 UT) CAPS Take 1,000 Units by mouth daily.    Yes [provider]  cloNIDine  (CATAPRES ) 0.1 MG tablet Take 0.1 mg by mouth See admin instructions. Take 1 and 1/2 tablet twice a day   Yes [provider]  clopidogrel  (PLAVIX ) 75 MG tablet Take 1 tablet (75 mg total) by mouth daily. 04/21/21  Yes Akula, Vijaya, MD  glucose 4 GM chewable tablet Chew 1 tablet by mouth once as needed for low blood sugar.   Yes [provider]  insulin  glargine (LANTUS ) 100 unit/mL SOPN Inject 14 Units into the skin 2 (two) times daily. 14 units am    6 units bedtime Patient taking differently: Inject 18 Units into the skin daily. 18 units am 01/04/21  Yes Justina Oman, MD  MOUNJARO 15 MG/0.5ML Pen Inject 15 mg into the skin once a week.   Yes [provider]  NOVOLOG  FLEXPEN 100 UNIT/ML FlexPen Inject 11-13 Units into the skin 3 (three) times daily with meals. 13 units am, 11 units noon, and 11 units pm 07/26/21  Yes [provider]  rosuvastatin  (CRESTOR ) 40 MG tablet Take 40 mg by mouth daily. 03/14/21  Yes [provider]  telmisartan (MICARDIS) 20 MG tablet Take 20 mg by mouth daily.  08/30/19  Yes [provider]  vitamin B-12 (CYANOCOBALAMIN ) 1000 MCG tablet Take 1,000 mcg by mouth daily.   Yes [provider]    Physical Exam: Vitals:   05/28/23 1930 05/28/23 1945 05/28/23 2045 05/28/23 2100  BP: (!) 134/59 134/67 125/68 114/69  Pulse:  96    Resp: 20 (!) 26 (!) 28 20  Temp:  97.7 F (36.5 C)    TempSrc:  Oral    SpO2:  95%    Weight:      Height:        Constitutional: NAD, calm, comfortable Vitals:   05/28/23 1930 05/28/23 1945 05/28/23 2045 05/28/23 2100  BP: (!) 134/59 134/67 125/68 114/69  Pulse:  96    Resp: 20 (!) 26 (!) 28 20  Temp:  97.7 F (36.5 C)     TempSrc:  Oral    SpO2:  95%    Weight:      Height:       Eyes: PERRL, lids and conjunctivae normal ENMT: Mucous membranes are moist..  Neck: normal, supple, no masses, no thyromegaly Respiratory: clear to auscultation bilaterally, no wheezing, no crackles. Normal respiratory effort. No accessory muscle use.  Cardiovascular: Regular rate and rhythm, no murmurs / rubs / gallops. No extremity edema..  Abdomen: no tenderness, no masses palpated. No hepatosplenomegaly. Bowel sounds positive.  Musculoskeletal: no clubbing / cyanosis. No joint deformity upper and lower extremities.  Skin: no rashes, lesions, ulcers. No induration Neurologic: CN 2-12 grossly intact. Sensation intact, DTR normal. Strength 5/5 in all 4.  Psychiatric: Normal judgment and  insight. Alert and oriented x 3. Normal mood.   Labs on Admission: I have personally reviewed following labs and imaging studies  CBC: Recent Labs  Lab 05/28/23 1608  WBC 6.1  NEUTROABS 4.8  HGB 11.1*  HCT 35.1*  MCV 94.6  PLT 201   Basic Metabolic Panel: Recent Labs  Lab 05/28/23 1608  NA 132*  K 4.4  CL 102  CO2 18*  GLUCOSE 168*  BUN 60*  CREATININE 3.63*  CALCIUM  9.6   Liver Function Tests: Recent Labs  Lab 05/28/23 1608  AST 470*  ALT 391*  ALKPHOS 122  BILITOT 0.6  PROT 7.3  ALBUMIN 3.3*   CBG: Recent Labs  Lab 05/28/23 1544  GLUCAP 142*   Radiological Exams on Admission: CT ABDOMEN PELVIS WO CONTRAST Result Date: 05/28/2023 CLINICAL DATA:  Weakness, nausea, transaminitis. EXAM: CT ABDOMEN AND PELVIS WITHOUT CONTRAST TECHNIQUE: Multidetector CT imaging of the abdomen and pelvis was performed following the standard protocol without IV contrast. RADIATION DOSE REDUCTION: This exam was performed according to the departmental dose-optimization program which includes automated exposure control, adjustment of the mA and/or kV according to patient size and/or use of iterative reconstruction technique. COMPARISON:   10/21/2018 FINDINGS: Lower chest: Coronary and descending thoracic aortic atherosclerosis. Scarring in both lower lobes. Stable (from 2020) 4 mm subpleural nodule in the right middle lobe, image 6 series 3. No further imaging workup of this lesion is indicated. Hepatobiliary: Cholecystectomy.  Otherwise unremarkable. Pancreas: Unremarkable Spleen: Unremarkable Adrenals/Urinary Tract: Small right adrenal mass previously shown to be adrenal adenoma on the CT from 10/21/2018, currently blurred by motion. No further imaging workup of this lesion is indicated. Hypodense renal lesions are blurred by motion artifact but are believed to be simple cysts based on density measurements. Suspected mild bilateral nonobstructive nephrolithiasis although vascular calcifications may be contributory to the appearance. No hydronephrosis, hydroureter, or ureteral calculus observed. Stomach/Bowel: Appendix 0.7 cm in diameter, upper normal size. No definite surrounding periappendiceal inflammatory findings. Scattered colonic diverticula. Questionable low-grade diverticulitis at the junction of the descending and sigmoid colon, with some subtle mesenteric stranding along the superior border of the bowel in this vicinity. Vascular/Lymphatic: Atherosclerosis is present, including aortoiliac atherosclerotic disease. Atheromatous plaque noted in the mesenteric vessels. Reproductive: Unremarkable Other: No supplemental non-categorized findings. Musculoskeletal: Borderline bilateral foraminal stenosis at L5-S1 due to facet arthropathy. IMPRESSION: 1. Questionable low-grade diverticulitis at the junction of the descending and sigmoid colon, with some subtle mesenteric stranding along the superior border of the bowel in this vicinity. 2. Appendix 0.7 cm in diameter, upper normal size. No definite surrounding periappendiceal inflammatory findings. 3. Small right adrenal adenoma, previously shown to be benign on the CT from 10/21/2018. No further  imaging workup of this lesion is indicated. 4. Suspected mild bilateral nonobstructive nephrolithiasis although vascular calcifications may be contributory to the appearance. 5. Borderline bilateral foraminal stenosis at L5-S1 due to facet arthropathy. 6.  Aortic Atherosclerosis (ICD10-I70.0).  Coronary atherosclerosis. Electronically Signed   By: Freida Jes M.D.   On: 05/28/2023 19:41   MR BRAIN WO CONTRAST Result Date: 05/28/2023 CLINICAL DATA:  Initial evaluation for acute neuro deficit, stroke suspected. EXAM: MRI HEAD WITHOUT CONTRAST TECHNIQUE: Multiplanar, multiecho pulse sequences of the brain and surrounding structures were obtained without intravenous contrast. COMPARISON:  CT from 05/28/2023 and MRI from 03/18/2021. FINDINGS: Brain: Generalized age-related cerebral atrophy. Patchy and confluent T2/FLAIR hyperintensity involving the periventricular and deep white matter both cerebral hemispheres as well as the pons, consistent chronic small vessel  ischemic disease, moderately advanced in nature. Encephalomalacia and gliosis involving the right parietal lobe, consistent with a chronic ischemic infarct. Multiple scattered chronic lacunar infarcts about the bilateral basal ganglia, thalami, and pons. No abnormal foci of restricted diffusion to suggest acute or subacute ischemia. No acute or chronic intracranial blood products. No mass lesion, midline shift or mass effect. No hydrocephalus or extra-axial fluid collection. Pituitary gland within normal limits. Vascular: Loss of normal flow void within the intradural right V4 segment, consistent with slow flow and/or occlusion. Appearance is similar as compared to previous exams. Major intracranial vascular flow voids are otherwise maintained. Skull and upper cervical spine: Craniocervical junction within normal limits. Bone marrow signal intensity normal. No scalp soft tissue abnormality. Sinuses/Orbits: Prior bilateral ocular lens replacement.  Extensive chronic frontoethmoidal, sphenoid, and right maxillary sinus disease. Trace right mastoid effusion noted, of doubtful significance. Other: None. IMPRESSION: 1. No acute intracranial abnormality. 2. Chronic right parietal infarct. 3. Underlying age-related cerebral atrophy with moderately advanced chronic microvascular ischemic disease, with multiple scattered chronic lacunar infarcts about the bilateral basal ganglia, thalami, and pons. 4. Extensive chronic paranasal sinus disease. Electronically Signed   By: Virgia Griffins M.D.   On: 05/28/2023 19:24   CT Head Wo Contrast Result Date: 05/28/2023 CLINICAL DATA:  Worsening weakness and slurred speech. EXAM: CT HEAD WITHOUT CONTRAST TECHNIQUE: Contiguous axial images were obtained from the base of the skull through the vertex without intravenous contrast. RADIATION DOSE REDUCTION: This exam was performed according to the departmental dose-optimization program which includes automated exposure control, adjustment of the mA and/or kV according to patient size and/or use of iterative reconstruction technique. COMPARISON:  November 06, 2021 FINDINGS: Brain: There is generalized cerebral atrophy with widening of the extra-axial spaces and ventricular dilatation. There are areas of decreased attenuation within the white matter tracts of the supratentorial brain, consistent with microvascular disease changes. A chronic posterior right frontal lobe infarct is seen near the vertex. Multiple small chronic bilateral basal ganglia lacunar infarcts are noted. Vascular: Marked severity bilateral cavernous carotid artery calcification is noted. Skull: Normal. Negative for fracture or focal lesion. Sinuses/Orbits: There is marked severity right maxillary sinus, right ethmoid sinus, sphenoid sinus, frontal sinus and right-sided nasal mucosal thickening. Chronic changes are also seen involving the walls of the right maxillary sinus. Other: None. IMPRESSION: 1.  Generalized cerebral atrophy with chronic white matter small vessel ischemic changes. 2. Chronic posterior right frontal lobe infarct. 3. Multiple small chronic bilateral basal ganglia lacunar infarcts. 4. Marked severity right-sided paranasal sinus disease. Electronically Signed   By: Virgle Grime M.D.   On: 05/28/2023 17:14   EKG: Independently reviewed.  Sinus rhythm rate 99, QTc 473.  Nonspecific changes to lead I aVL.  Repeat in a.m.  Assessment/Plan Principal Problem:   Elevated liver enzymes Active Problems:   Acute kidney injury superimposed on chronic kidney disease (HCC)   Type 2 diabetes mellitus with hyperlipidemia (HCC)   Essential hypertension   CVA (cerebral vascular accident) (HCC)   CKD (chronic kidney disease), stage IV (HCC)   Obstructive sleep apnea   Assessment and Plan: * Elevated liver enzymes No abdominal pain.  Reports nausea without vomiting and generalized weakness.  AST 470, ALT 391.  Normal ALP and T. bili.  Reports taking just 2 Tylenol  daily.  She is also on Fioricet which contains acetaminophen .  CT abdomen and pelvis WO -hepatobiliary - cholecystectomy, otherwise unremarkable. - Hepatitis panel in a.m. -Check Tylenol , salicylate levels. -Hold Crestor  40 mg daily -  If persistent/worsening transaminitis, consider GI eval  Acute kidney injury superimposed on chronic kidney disease (HCC) AKI on CKD 4.  Creatinine 3.63, check a year ago, baseline 2.8-3.  Daughter reports poor oral intake.  AKI versus progression of her chronic kidney disease. -Hold telmisartan -Hydrate 2 L bolus given, continue N/s 75cc/hr x 15hrs  CVA (cerebral vascular accident) (HCC) Reported tingling to left upper extremity, this is stable and at baseline with her other residual stroke deficits. -Hold Statin with elevated liver enzyme -Resume aspirin , Plavix   Essential hypertension Stable. -Hold telmisartan for now with AKI -Resume clonidine  0.15 milligram twice daily,  carvedilol  25 mg twice daily  Type 2 diabetes mellitus with hyperlipidemia (HCC) - HgbA1c - SSI- S - Resume home Lantus  at reduced dose 10 units daily (home dose 18 units) - Hold home NovoLog  (13/11/11), hold Mounjaro  Diverticulitis-likely incidental finding.  She reports nausea and weakness but no abdominal symptoms no diarrhea.  CT shows questionable low-grade diverticulitis at the junction of the descending and sigmoid colon.  Afebrile without leukocytosis.  Daughter reports patient was severely constipated for several days until about 2 days ago when she had a very large bowel movement. - Deferred antibiotics for now, Low threshold to start   EKG with nonspecific changes, 1 and aVL.  No chest pain.  Will repeat in a.m   DVT prophylaxis: heparin  Code Status: FULL Family Communication: Daughter Briana Garrison at bedside contact [phone]. Disposition Plan: ~ 1- 2 days Consults called: None Admission status:  Obs tele   Author: Pati Bonine, MD 05/28/2023 10:52 PM  For on call review [email or web address].

## 2023-05-28 NOTE — Assessment & Plan Note (Addendum)
-   HgbA1c - SSI- S - Resume home Lantus  at reduced dose 10 units daily (home dose 18 units) - Hold home NovoLog  (13/11/11), hold Mounjaro

## 2023-05-28 NOTE — Assessment & Plan Note (Addendum)
 No abdominal pain.  Reports nausea without vomiting and generalized weakness.  AST 470, ALT 391.  Normal ALP and T. bili.  Reports taking just 2 Tylenol  daily.  She is also on Fioricet which contains acetaminophen .  CT abdomen and pelvis WO -hepatobiliary - cholecystectomy, otherwise unremarkable. - Hepatitis panel in a.m. -Check Tylenol , salicylate levels. -Hold Crestor  40 mg daily -If persistent/worsening transaminitis, consider GI eval

## 2023-05-28 NOTE — ED Provider Notes (Signed)
 [place] [facility]MENT AT [facility] Provider Note   CSN: [id number] Arrival date & time: 05/28/23  1520     History {Add pertinent medical, surgical, social history, OB history to HPI:1} Chief Complaint  Patient presents with  . Weakness    Briana Garrison is a 73 y.o. female with PMH as listed below who arrived via POV with her husband who reports the pt has had progressively worsening generalized weakness over the past few weeks.  Pt has left sided weakness at baseline, and has h/o slurred speech but patient's husband states that for about 1 week it has been abnormal. HA is BL, frontal, gradual at onset, not helped by OTC meds, a/w nausea, no vomiting. Denise new focal weakness but has had some tingling in her left hand that's new. No falls, head trauma, f/c, flu-like sxs, cough, SOB, CP, abd pain, urinary sxs.   Past Medical History:  Diagnosis Date  . Cancer of kidney (HCC) 05/14/2009  . Diabetes mellitus   . Endometrial mass   . GERD (gastroesophageal reflux disease)   . Hypercholesterolemia   . Hypertension   . Internal hemorrhoids    S/P colonoscopy 06/10/00 by Dr. Alethia Huxley  . Shingles   . Stroke [facility]) 09/2019   2 strokes       Home Medications Prior to Admission medications   Medication Sig Start Date End Date Taking? Authorizing Provider  aspirin  81 MG chewable tablet Chew 1 tablet (81 mg total) by mouth daily. Patient taking differently: Chew 81 mg by mouth in the morning and at bedtime. 03/21/21  Yes Akula, Vijaya, MD  butalbital-acetaminophen -caffeine (FIORICET) 50-325-40 MG tablet Take 1 tablet by mouth every 4 (four) hours as needed for headache or migraine.   Yes [provider]  carvedilol  (COREG ) 25 MG tablet Take 1 tablet (25 mg total) by mouth 2 (two) times daily. Patient taking differently: Take 25 mg by mouth 2 (two) times daily. Take 1/2 tablet twice daily 01/04/21  Yes Justina Oman, MD  Cholecalciferol  (VITAMIN D -3) 25 MCG (1000  UT) CAPS Take 1,000 Units by mouth daily.    Yes [provider]  cloNIDine  (CATAPRES ) 0.1 MG tablet Take 0.1 mg by mouth See admin instructions. Take 1 and 1/2 tablet twice a day   Yes [provider]  clopidogrel  (PLAVIX ) 75 MG tablet Take 1 tablet (75 mg total) by mouth daily. 04/21/21  Yes Akula, Vijaya, MD  glucose 4 GM chewable tablet Chew 1 tablet by mouth once as needed for low blood sugar.   Yes [provider]  insulin  glargine (LANTUS ) 100 unit/mL SOPN Inject 14 Units into the skin 2 (two) times daily. 14 units am    6 units bedtime Patient taking differently: Inject 18 Units into the skin daily. 18 units am 01/04/21  Yes Justina Oman, MD  MOUNJARO 15 MG/0.5ML Pen Inject 15 mg into the skin once a week.   Yes [provider]  NOVOLOG  FLEXPEN 100 UNIT/ML FlexPen Inject 11-13 Units into the skin 3 (three) times daily with meals. 13 units am, 11 units noon, and 11 units pm 07/26/21  Yes [provider]  rosuvastatin  (CRESTOR ) 40 MG tablet Take 40 mg by mouth daily. 03/14/21  Yes [provider]  telmisartan (MICARDIS) 20 MG tablet Take 20 mg by mouth daily.  08/30/19  Yes [provider]  vitamin B-12 (CYANOCOBALAMIN ) 1000 MCG tablet Take 1,000 mcg by mouth daily.   Yes [provider]  Allergies    Amlodipine , Tramadol , Acetaminophen , and Ibuprofen    Review of Systems   Review of Systems A 10 point review of systems was performed and is negative unless otherwise reported in HPI.  Physical Exam Updated Vital Signs BP 138/80   Pulse 87   Temp 98.5 F (36.9 C) (Oral)   Resp 16   Ht 5\' 1"  (1.549 m)   Wt 81.6 kg   SpO2 99%   BMI 34.01 kg/m  Physical Exam General: Normal appearing female, lying in bed.  HEENT: PERRLA, Sclera anicteric, MMM, trachea midline.  Cardiology: RRR, no murmurs/rubs/gallops. BL radial and DP pulses equal bilaterally.  Resp: Normal respiratory rate and effort. CTAB, no wheezes,  rhonchi, crackles.  Abd: Soft, non-tender, non-distended. No rebound tenderness or guarding.  GU: Deferred. MSK: No peripheral edema or signs of trauma. Extremities without deformity or TTP. No cyanosis or clubbing. Skin: warm, dry. No rashes or lesions. Back: No CVA tenderness Neuro: A&Ox4, CNs II-XII grossly intact. MAEs. Sensation grossly intact.  Psych: Normal mood and affect.   ED Results / Procedures / Treatments   Labs (all labs ordered are listed, but only abnormal results are displayed) Labs Reviewed  CBC WITH DIFFERENTIAL/PLATELET - Abnormal; Notable for the following components:      Result Value   RBC 3.71 (*)    Hemoglobin 11.1 (*)    HCT 35.1 (*)    Lymphs Abs 0.6 (*)    Abs Immature Granulocytes 0.08 (*)    All other components within normal limits  COMPREHENSIVE METABOLIC PANEL WITH GFR - Abnormal; Notable for the following components:   Sodium 132 (*)    CO2 18 (*)    Glucose, Bld 168 (*)    BUN 60 (*)    Creatinine, Ser 3.63 (*)    Albumin 3.3 (*)    AST 470 (*)    ALT 391 (*)    GFR, Estimated 13 (*)    All other components within normal limits  GLUCOSE, CAPILLARY - Abnormal; Notable for the following components:   Glucose-Capillary 133 (*)    All other components within normal limits  HEMOGLOBIN A1C - Abnormal; Notable for the following components:   Hgb A1c MFr Bld 8.7 (*)    All other components within normal limits  BASIC METABOLIC PANEL WITH GFR - Abnormal; Notable for the following components:   CO2 20 (*)    BUN 56 (*)    Creatinine, Ser 2.98 (*)    Calcium  8.7 (*)    GFR, Estimated 16 (*)    All other components within normal limits  CBC - Abnormal; Notable for the following components:   RBC 3.35 (*)    Hemoglobin 9.7 (*)    HCT 31.4 (*)    All other components within normal limits  SALICYLATE LEVEL - Abnormal; Notable for the following components:   Salicylate Lvl <7.0 (*)    All other components within normal limits  ACETAMINOPHEN   LEVEL - Abnormal; Notable for the following components:   Acetaminophen  (Tylenol ), Serum 32 (*)    All other components within normal limits  HEPATIC FUNCTION PANEL - Abnormal; Notable for the following components:   Total Protein 6.2 (*)    Albumin 2.8 (*)    AST 147 (*)    ALT 237 (*)    All other components within normal limits  GLUCOSE, CAPILLARY - Abnormal; Notable for the following components:   Glucose-Capillary 119 (*)    All other components within normal  limits  GLUCOSE, CAPILLARY - Abnormal; Notable for the following components:   Glucose-Capillary 137 (*)    All other components within normal limits  CBC - Abnormal; Notable for the following components:   RBC 3.47 (*)    Hemoglobin 10.1 (*)    HCT 32.3 (*)    All other components within normal limits  COMPREHENSIVE METABOLIC PANEL WITH GFR - Abnormal; Notable for the following components:   Sodium 134 (*)    CO2 18 (*)    Glucose, Bld 180 (*)    BUN 59 (*)    Creatinine, Ser 2.97 (*)    Calcium  8.8 (*)    Albumin 3.0 (*)    AST 52 (*)    ALT 159 (*)    GFR, Estimated 16 (*)    All other components within normal limits  GLUCOSE, CAPILLARY - Abnormal; Notable for the following components:   Glucose-Capillary 137 (*)    All other components within normal limits  GLUCOSE, CAPILLARY - Abnormal; Notable for the following components:   Glucose-Capillary 170 (*)    All other components within normal limits  CBG MONITORING, ED - Abnormal; Notable for the following components:   Glucose-Capillary 142 (*)    All other components within normal limits  CULTURE, BLOOD (ROUTINE X 2)  CULTURE, BLOOD (ROUTINE X 2)  RESP PANEL BY RT-PCR (RSV, FLU A&B, COVID)  RVPGX2  LACTIC ACID, PLASMA  HEPATITIS A ANTIBODY, IGM  HEPATITIS B SURFACE ANTIGEN  HEPATITIS B CORE ANTIBODY, IGM  HEPATITIS C ANTIBODY  GLUCOSE, CAPILLARY  MAGNESIUM    EKG EKG Interpretation Date/Time:  Thursday May 28 2023 18:46:33 EDT Ventricular Rate:   99 PR Interval:  182 QRS Duration:  89 QT Interval:  368 QTC Calculation: 473 R Axis:   174  Text Interpretation: Right and left arm electrode reversal, interpretation assumes no reversal Sinus or ectopic atrial rhythm Probable lateral infarct, age indeterminate t wave changes laterally have resolved; new t wave changes anteriorally Confirmed by Sueellen Emery [phone]) on 05/29/2023 2:57:51 PM  Radiology No results found.  Procedures Procedures  {Document cardiac monitor, telemetry assessment procedure when appropriate:1}  Medications Ordered in ED Medications  0.9 %  sodium chloride  infusion (0 mLs Intravenous Stopped 05/29/23 1246)  metoCLOPramide  (REGLAN ) injection 10 mg (10 mg Intravenous Given 05/28/23 1703)  diphenhydrAMINE  (BENADRYL ) injection 25 mg (25 mg Intravenous Given 05/28/23 1703)  lactated ringers  bolus 1,000 mL (0 mLs Intravenous Stopped 05/28/23 2100)  lactated ringers  bolus 1,000 mL (1,000 mLs Intravenous New Bag/Given 05/28/23 2134)  diphenhydrAMINE  (BENADRYL ) injection 25 mg (25 mg Intravenous Given 05/29/23 0128)  metoCLOPramide  (REGLAN ) injection 10 mg (10 mg Intravenous Given 05/29/23 0127)  loperamide (IMODIUM) capsule 4 mg (4 mg Oral Given 05/30/23 0053)    ED Course/ Medical Decision Making/ A&P                          Medical Decision Making Amount and/or Complexity of Data Reviewed Labs: ordered. Decision-making details documented in ED Course. Radiology: ordered. Decision-making details documented in ED Course.  Risk Prescription drug management. Decision regarding hospitalization.    This patient presents to the ED for concern of gen weakness, HA, nausea; this involves an extensive number of treatment options, and is a complaint that carries with it a high risk of complications and morbidity.  I considered the following differential and admission for this acute, potentially life threatening condition.   MDM:    DDX  for generalized weakness includes but is  not limited to:  Infectious processes, severe metabolic derangements or electrolyte abnormalities, anemia. W/ HA and L hand tingling, possibly worsening slurred speech, must consider ICH or CVA. Will begin eval w/ labs/CTH.  No Cp/SOB to indicate ACS/ischemia. No SOB/orthopnea/leg swelling to indicate HF. Will give HA cocktail. No f/c or leukocytosis to indicate meningitis.   Clinical Course as of 05/30/23 2037  Thu May 28, 2023  1641 WBC: 6.1 No leukocytosis  [HN]  1641 Lactic Acid, Venous: 1.6 neg [HN]  1734 CT Head Wo Contrast 1. Generalized cerebral atrophy with chronic white matter small vessel ischemic changes. 2. Chronic posterior right frontal lobe infarct. 3. Multiple small chronic bilateral basal ganglia lacunar infarcts. 4. Marked severity right-sided paranasal sinus disease.   [HN]  1735 AST(!): 470 [HN]  1735 ALT(!): 391 +transaminitis, will get RUQ US  [HN]  1735 WBC: 6.1 No leukocytosis  [HN]  1736 Creatinine(!): 3.63 +AKI with elevated BUN, will give fluids. [HN]  1738 Resp panel by RT-PCR (RSV, Flu A&B, Covid) Anterior Nasal Swab neg [HN]  1852 US  techs no longer present, will get CT abd pelvis [HN]  1933 MR BRAIN WO CONTRAST 1. No acute intracranial abnormality. 2. Chronic right parietal infarct. 3. Underlying age-related cerebral atrophy with moderately advanced chronic microvascular ischemic disease, with multiple scattered chronic lacunar infarcts about the bilateral basal ganglia, thalami, and pons. 4. Extensive chronic paranasal sinus disease.   [HN]  2007 CT ABDOMEN PELVIS WO CONTRAST 1. Questionable low-grade diverticulitis at the junction of the descending and sigmoid colon, with some subtle mesenteric stranding along the superior border of the bowel in this vicinity. 2. Appendix 0.7 cm in diameter, upper normal size. No definite surrounding periappendiceal inflammatory findings. 3. Small right adrenal adenoma, previously shown to be benign on the  CT from 10/21/2018. No further imaging workup of this lesion is indicated. 4. Suspected mild bilateral nonobstructive nephrolithiasis although vascular calcifications may be contributory to the appearance. 5. Borderline bilateral foraminal stenosis at L5-S1 due to facet arthropathy. 6.  Aortic Atherosclerosis (ICD10-I70.0).  Coronary atherosclerosis.   [HN]    Clinical Course User Index [HN] Merdis Stalling, MD    Labs: I Ordered, and personally interpreted labs.  The pertinent results include:  those listed above  Imaging Studies ordered: I ordered imaging studies including CTH, CT abd pelvis, MRI brain I independently visualized and interpreted imaging. I agree with the radiologist interpretation  Additional history obtained from chart review, husband at bedside  Cardiac Monitoring: .The patient was maintained on a cardiac monitor.  I personally viewed and interpreted the cardiac monitored which showed an underlying rhythm of: NSR  Reevaluation: After the interventions noted above, I reevaluated the patient and found that they have :stayed the same  Social Determinants of Health: .Lives independently  Disposition:  Admitted to hospitalist  Co morbidities that complicate the patient evaluation . Past Medical History:  Diagnosis Date  . Cancer of kidney (HCC) 05/14/2009  . Diabetes mellitus   . Endometrial mass   . GERD (gastroesophageal reflux disease)   . Hypercholesterolemia   . Hypertension   . Internal hemorrhoids    S/P colonoscopy 06/10/00 by Dr. Alethia Huxley  . Shingles   . Stroke [facility]) 09/2019   2 strokes     Medicines Meds ordered this encounter  Medications  . metoCLOPramide  (REGLAN ) injection 10 mg  . diphenhydrAMINE  (BENADRYL ) injection 25 mg  . lactated ringers  bolus 1,000 mL  . lactated ringers  bolus 1,000  mL  . DISCONTD: fluticasone  (FLONASE ) 50 MCG/ACT nasal spray 2 spray  . DISCONTD: aspirin  chewable tablet 81 mg  . DISCONTD: carvedilol  (COREG ) tablet  25 mg    Patient taking differently: Take 1/2 tablet twice daily    . DISCONTD: cloNIDine  (CATAPRES ) tablet 0.15 mg    Take 1 and 1/2 tablet twice a day    . DISCONTD: clopidogrel  (PLAVIX ) tablet 75 mg  . DISCONTD: insulin  glargine-yfgn (SEMGLEE ) injection 10 Units    OP SIG:14 units am    6 units bedtime Patient taking differently: 18 units am    . DISCONTD: insulin  aspart (novoLOG ) injection 0-9 Units    Correction coverage::   Sensitive (thin, NPO, renal)    CBG < 70::   Implement Hypoglycemia Standing Orders and refer to Hypoglycemia Standing Orders sidebar report    CBG 70 - 120::   0 units    CBG 121 - 150::   1 unit    CBG 151 - 200::   2 units    CBG 201 - 250::   3 units    CBG 251 - 300::   5 units    CBG 301 - 350::   7 units    CBG 351 - 400:   9 units    CBG > 400:   call MD and obtain STAT lab verification  . DISCONTD: insulin  aspart (novoLOG ) injection 0-5 Units    Correction coverage::   HS scale    CBG < 70::   Implement Hypoglycemia Standing Orders and refer to Hypoglycemia Standing Orders sidebar report    CBG 70 - 120::   0 units    CBG 121 - 150::   0 units    CBG 151 - 200::   0 units    CBG 201 - 250::   2 units    CBG 251 - 300::   3 units    CBG 301 - 350::   4 units    CBG 351 - 400::   5 units    CBG > 400:   call MD and obtain STAT lab verification  . DISCONTD: heparin  injection 5,000 Units  . 0.9 %  sodium chloride  infusion  . DISCONTD: ondansetron  (ZOFRAN ) tablet 4 mg  . DISCONTD: ondansetron  (ZOFRAN ) injection 4 mg  . DISCONTD: polyethylene glycol (MIRALAX  / GLYCOLAX ) packet 17 g  . DISCONTD: oxyCODONE  (Oxy IR/ROXICODONE ) immediate release tablet 5 mg    Refill:  0  . diphenhydrAMINE  (BENADRYL ) injection 25 mg  . metoCLOPramide  (REGLAN ) injection 10 mg  . loperamide (IMODIUM) capsule 4 mg    I have reviewed the patients home medicines and have made adjustments as needed  Problem List / ED Course: Problem List Items Addressed This Visit        Genitourinary   Acute kidney injury superimposed on chronic kidney disease (HCC)   AKI on CKD 4.  Creatinine 3.63, check a year ago, baseline 2.8-3.  Daughter reports poor oral intake.  AKI versus progression of her chronic kidney disease. -Hold telmisartan -Hydrate 2 L bolus given, continue N/s 75cc/hr x 15hrs      Other Visit Diagnoses       Generalized weakness    -  Primary     Transaminitis                {Document critical care time when appropriate:1} {Document review of labs and clinical decision tools ie heart score, Chads2Vasc2 etc:1}  {Document your independent review of radiology  images, and any outside records:1} {Document your discussion with family members, caretakers, and with consultants:1} {Document social determinants of health affecting pt's care:1} {Document your decision making why or why not admission, treatments were needed:1}  This note was created using dictation software, which may contain spelling or grammatical errors.

## 2023-05-28 NOTE — ED Triage Notes (Signed)
 Pt arrived via POV with her husband who reports the Pt has had progressively worsening weakness over the past few weeks, and has Hx of strokes. Pt has left sided weakness at baseline, and has Hx of slurring speech.

## 2023-05-28 NOTE — Care Management Obs Status (Signed)
 MEDICARE OBSERVATION STATUS NOTIFICATION   Patient Details  Name: Briana Garrison MRN: [id number] Date of Birth: 1950/05/15   Medicare Observation Status Notification Given:  Yes    Geraldina Klinefelter, RN 05/28/2023, 10:01 PM

## 2023-05-28 NOTE — Progress Notes (Signed)
   05/28/23 2218  TOC Brief Assessment  Insurance and Status Reviewed  Patient has primary care physician Yes  Home environment has been reviewed From home  Prior level of function: Independent  Prior/Current Home Services No current home services  Social Drivers of Health Review SDOH reviewed no interventions necessary  Readmission risk has been reviewed Yes  Transition of care needs no transition of care needs at this time   Pt lives at home c/husband and daughter. Uses cane for ambulation.   Transition of Care Department [facility]) has reviewed patient and no other TOC needs have been identified at this time. We will continue to monitor patient advancement through interdisciplinary progression rounds. If new patient needs arise, please place a TOC consult.

## 2023-05-28 NOTE — Assessment & Plan Note (Signed)
 Stable. -Hold telmisartan for now with AKI -Resume clonidine  0.15 milligram twice daily, carvedilol  25 mg twice daily

## 2023-05-28 NOTE — ED Notes (Signed)
 ED Provider at bedside.

## 2023-05-28 NOTE — Assessment & Plan Note (Addendum)
 AKI on CKD 4.  Creatinine 3.63, check a year ago, baseline 2.8-3.  Daughter reports poor oral intake.  AKI versus progression of her chronic kidney disease. -Hold telmisartan -Hydrate 2 L bolus given, continue N/s 75cc/hr x 15hrs

## 2023-05-28 NOTE — Assessment & Plan Note (Signed)
 Reported tingling to left upper extremity, this is stable and at baseline with her other residual stroke deficits. -Hold Statin with elevated liver enzyme -Resume aspirin , Plavix 

## 2023-05-29 DIAGNOSIS — Z7982 Long term (current) use of aspirin: Secondary | ICD-10-CM | POA: Diagnosis not present

## 2023-05-29 DIAGNOSIS — N179 Acute kidney failure, unspecified: Secondary | ICD-10-CM | POA: Diagnosis not present

## 2023-05-29 DIAGNOSIS — N184 Chronic kidney disease, stage 4 (severe): Secondary | ICD-10-CM | POA: Diagnosis not present

## 2023-05-29 DIAGNOSIS — G4733 Obstructive sleep apnea (adult) (pediatric): Secondary | ICD-10-CM | POA: Diagnosis not present

## 2023-05-29 DIAGNOSIS — R748 Abnormal levels of other serum enzymes: Secondary | ICD-10-CM | POA: Diagnosis not present

## 2023-05-29 DIAGNOSIS — I129 Hypertensive chronic kidney disease with stage 1 through stage 4 chronic kidney disease, or unspecified chronic kidney disease: Secondary | ICD-10-CM | POA: Diagnosis not present

## 2023-05-29 DIAGNOSIS — E785 Hyperlipidemia, unspecified: Secondary | ICD-10-CM | POA: Diagnosis not present

## 2023-05-29 DIAGNOSIS — Z8673 Personal history of transient ischemic attack (TIA), and cerebral infarction without residual deficits: Secondary | ICD-10-CM | POA: Diagnosis not present

## 2023-05-29 DIAGNOSIS — R7401 Elevation of levels of liver transaminase levels: Secondary | ICD-10-CM | POA: Diagnosis not present

## 2023-05-29 DIAGNOSIS — E1122 Type 2 diabetes mellitus with diabetic chronic kidney disease: Secondary | ICD-10-CM | POA: Diagnosis not present

## 2023-05-29 LAB — GLUCOSE, CAPILLARY
Glucose-Capillary: 90 mg/dL (ref 70–99)
Glucose-Capillary: 119 mg/dL — ABNORMAL HIGH (ref 70–99)
Glucose-Capillary: 137 mg/dL — ABNORMAL HIGH (ref 70–99)
Glucose-Capillary: 137 mg/dL — ABNORMAL HIGH (ref 70–99)

## 2023-05-29 LAB — CBC
HCT: 31.4 % — ABNORMAL LOW (ref 36.0–46.0)
Hemoglobin: 9.7 g/dL — ABNORMAL LOW (ref 12.0–15.0)
MCH: 29 pg (ref 26.0–34.0)
MCHC: 30.9 g/dL (ref 30.0–36.0)
MCV: 93.7 fL (ref 80.0–100.0)
Platelets: 168 10*3/uL (ref 150–400)
RBC: 3.35 MIL/uL — ABNORMAL LOW (ref 3.87–5.11)
RDW: 14.1 % (ref 11.5–15.5)
WBC: 7.9 10*3/uL (ref 4.0–10.5)
nRBC: 0 % (ref 0.0–0.2)

## 2023-05-29 LAB — HEPATIC FUNCTION PANEL
ALT: 237 U/L — ABNORMAL HIGH (ref 0–44)
AST: 147 U/L — ABNORMAL HIGH (ref 15–41)
Albumin: 2.8 g/dL — ABNORMAL LOW (ref 3.5–5.0)
Alkaline Phosphatase: 96 U/L (ref 38–126)
Bilirubin, Direct: 0.1 mg/dL (ref 0.0–0.2)
Indirect Bilirubin: 0.7 mg/dL (ref 0.3–0.9)
Total Bilirubin: 0.8 mg/dL (ref 0.0–1.2)
Total Protein: 6.2 g/dL — ABNORMAL LOW (ref 6.5–8.1)

## 2023-05-29 LAB — BASIC METABOLIC PANEL WITH GFR
Anion gap: 10 (ref 5–15)
BUN: 56 mg/dL — ABNORMAL HIGH (ref 8–23)
CO2: 20 mmol/L — ABNORMAL LOW (ref 22–32)
Calcium: 8.7 mg/dL — ABNORMAL LOW (ref 8.9–10.3)
Chloride: 107 mmol/L (ref 98–111)
Creatinine, Ser: 2.98 mg/dL — ABNORMAL HIGH (ref 0.44–1.00)
GFR, Estimated: 16 mL/min — ABNORMAL LOW (ref >60)
Glucose, Bld: 95 mg/dL (ref 70–99)
Potassium: 4.2 mmol/L (ref 3.5–5.1)
Sodium: 137 mmol/L (ref 135–145)

## 2023-05-29 LAB — HEMOGLOBIN A1C
Hgb A1c MFr Bld: 8.7 % — ABNORMAL HIGH (ref 4.8–5.6)
Mean Plasma Glucose: 202.99 mg/dL

## 2023-05-29 LAB — HEPATITIS A ANTIBODY, IGM: Hep A IgM: NONREACTIVE

## 2023-05-29 LAB — HEPATITIS C ANTIBODY: HCV Ab: NONREACTIVE

## 2023-05-29 LAB — HEPATITIS B CORE ANTIBODY, IGM: Hep B C IgM: NONREACTIVE

## 2023-05-29 LAB — HEPATITIS B SURFACE ANTIGEN: Hepatitis B Surface Ag: NONREACTIVE

## 2023-05-29 MED ORDER — DIPHENHYDRAMINE HCL 50 MG/ML IJ SOLN
25.0000 mg | Freq: Once | INTRAMUSCULAR | Status: AC
  Administered 2023-05-29: 25 mg via INTRAVENOUS
  Filled 2023-05-29: qty 1

## 2023-05-29 MED ORDER — METOCLOPRAMIDE HCL 5 MG/ML IJ SOLN
10.0000 mg | Freq: Once | INTRAMUSCULAR | Status: AC
  Administered 2023-05-29: 10 mg via INTRAVENOUS
  Filled 2023-05-29: qty 2

## 2023-05-29 NOTE — Progress Notes (Addendum)
   05/29/23 1700  Urine Measurement/Characteristics  Urinary Interventions Bladder scan  Bladder Scan Volume (mL) 287 mL   MD Mason Sole notified. MD Mason Sole states to in and out cath if greater than 350.

## 2023-05-29 NOTE — Progress Notes (Signed)
 PROGRESS NOTE    Briana Garrison  YNW:[id number] DOB: 1950-05-17 DOA: 05/28/2023 PCP: Imelda Man, MD   Brief Narrative:    Briana Garrison is a 73 y.o. female with medical history significant for strokes, diabetes mellitus, hypertension, CKD 4. Patient was brought to the ED with complaints of generalized weakness over the past few weeks and headaches.  Patient has residual deficits from prior stroke including slurred speech, weakness to the left side, and tingling to the left side which is unchanged from baseline.  Patient was noted to have AKI on CKD stage IV along with elevated liver enzymes, but without any associated symptomatology.  She has had poor oral intake and further evaluation is pending.  She was recently noted to have significant Tylenol  use at home given persistent headaches as well as URI.  Assessment & Plan:   Principal Problem:   Elevated liver enzymes Active Problems:   Acute kidney injury superimposed on chronic kidney disease (HCC)   Type 2 diabetes mellitus with hyperlipidemia (HCC)   Essential hypertension   CVA (cerebral vascular accident) (HCC)   CKD (chronic kidney disease), stage IV (HCC)   Obstructive sleep apnea  Assessment and Plan:   Elevated liver enzymes-downward trending No abdominal pain.  Reports nausea without vomiting and generalized weakness.  AST 470, ALT 391.  Normal ALP and T. bili.  Reports taking just 2 Tylenol  daily.  She is also on Fioricet which contains acetaminophen .  CT abdomen and pelvis WO -hepatobiliary - cholecystectomy, otherwise unremarkable. - Hepatitis panel in a.m. currently pending - Tylenol  is minimally elevated and may be the cause -Hold Crestor  40 mg daily   Acute kidney injury superimposed on chronic kidney disease (HCC)-improving AKI on CKD 4.  Creatinine 3.63, check a year ago, baseline 2.8-3.  Daughter reports poor oral intake.  AKI versus progression of her chronic kidney disease. -Hold telmisartan -Hydrate 2 L  bolus given, continue N/s 75cc/hr x 15hrs   CVA (cerebral vascular accident) (HCC) Reported tingling to left upper extremity, this is stable and at baseline with her other residual stroke deficits. -Hold Statin with elevated liver enzyme -Resume aspirin , Plavix    Essential hypertension Stable. -Hold telmisartan for now with AKI -Resume clonidine  0.15 milligram twice daily, carvedilol  25 mg twice daily   Type 2 diabetes mellitus with hyperlipidemia (HCC) - HgbA1c - SSI- S - Resume home Lantus  at reduced dose 10 units daily (home dose 18 units) - Hold home NovoLog  (13/11/11), hold Mounjaro   Diverticulitis-likely incidental finding.  She reports nausea and weakness but no abdominal symptoms no diarrhea.  CT shows questionable low-grade diverticulitis at the junction of the descending and sigmoid colon.  Afebrile without leukocytosis.  Daughter reports patient was severely constipated for several days until about 2 days ago when she had a very large bowel movement. - Deferred antibiotics for now, Low threshold to start  -Denies abdominal pain at this time  Obesity, class I - BMI 34.01   DVT prophylaxis:Heparin  Code Status: Full Family Communication: None at bedside. Disposition Plan:  Status is: Observation The patient will require care spanning > 2 midnights and should be moved to inpatient because: Need for IV fluid and closer monitoring.  Consultants:  None  Procedures:  None  Antimicrobials:  None   Subjective: Patient seen and evaluated today with no new acute complaints or concerns. No acute concerns or events noted overnight.  She continues to have mild headache and some sinus congestion this morning.  Denies any abdominal pain.  Objective: Vitals:   05/28/23 2246 05/29/23 0207 05/29/23 0530 05/29/23 0816  BP:  113/64 115/69 122/67  Pulse:  (!) 110 (!) 109   Resp:  20 20   Temp:  97.8 F (36.6 C) 99.3 F (37.4 C)   TempSrc:  Oral Oral   SpO2: 100% 99% 97%    Weight:      Height:       No intake or output data in the 24 hours ending 05/29/23 1024 Filed Weights   05/28/23 1541 05/28/23 2211  Weight: 82.1 kg 81.6 kg    Examination:  General exam: Appears calm and comfortable  Respiratory system: Clear to auscultation. Respiratory effort normal. Cardiovascular system: S1 & S2 heard, RRR.  Gastrointestinal system: Abdomen is soft Central nervous system: Alert and awake Extremities: No edema Skin: No significant lesions noted Psychiatry: Flat affect.    Data Reviewed: I have personally reviewed following labs and imaging studies  CBC: Recent Labs  Lab 05/28/23 1608 05/29/23 0447  WBC 6.1 7.9  NEUTROABS 4.8  --   HGB 11.1* 9.7*  HCT 35.1* 31.4*  MCV 94.6 93.7  PLT 201 168   Basic Metabolic Panel: Recent Labs  Lab 05/28/23 1608 05/29/23 0447  NA 132* 137  K 4.4 4.2  CL 102 107  CO2 18* 20*  GLUCOSE 168* 95  BUN 60* 56*  CREATININE 3.63* 2.98*  CALCIUM  9.6 8.7*   GFR: Estimated Creatinine Clearance: 16.3 mL/min (A) (by C-G formula based on SCr of 2.98 mg/dL (H)). Liver Function Tests: Recent Labs  Lab 05/28/23 1608 05/29/23 0447  AST 470* 147*  ALT 391* 237*  ALKPHOS 122 96  BILITOT 0.6 0.8  PROT 7.3 6.2*  ALBUMIN 3.3* 2.8*   No results for input(s): "LIPASE", "AMYLASE" in the last 168 hours. No results for input(s): "AMMONIA" in the last 168 hours. Coagulation Profile: No results for input(s): "INR", "PROTIME" in the last 168 hours. Cardiac Enzymes: No results for input(s): "CKTOTAL", "CKMB", "CKMBINDEX", "TROPONINI" in the last 168 hours. BNP (last 3 results) No results for input(s): "PROBNP" in the last 8760 hours. HbA1C: No results for input(s): "HGBA1C" in the last 72 hours. CBG: Recent Labs  Lab 05/28/23 1544 05/28/23 2225 05/29/23 0726  GLUCAP 142* 133* 90   Lipid Profile: No results for input(s): "CHOL", "HDL", "LDLCALC", "TRIG", "CHOLHDL", "LDLDIRECT" in the last 72 hours. Thyroid   Function Tests: No results for input(s): "TSH", "T4TOTAL", "FREET4", "T3FREE", "THYROIDAB" in the last 72 hours. Anemia Panel: No results for input(s): "VITAMINB12", "FOLATE", "FERRITIN", "TIBC", "IRON", "RETICCTPCT" in the last 72 hours. Sepsis Labs: Recent Labs  Lab 05/28/23 1608  LATICACIDVEN 1.6    Recent Results (from the past 240 hours)  Blood culture (routine x 2)     Status: None (Preliminary result)   Collection Time: 05/28/23  4:08 PM   Specimen: BLOOD LEFT HAND  Result Value Ref Range Status   Specimen Description BLOOD LEFT HAND AEROBIC BOTTLE ONLY  Final   Special Requests   Final    Blood Culture results may not be optimal due to an inadequate volume of blood received in culture bottles   Culture   Final    NO GROWTH < 24 HOURS Performed at [facility], [address] [place], [place] [address]    Report Status PENDING  Incomplete  Blood culture (routine x 2)     Status: None (Preliminary result)   Collection Time: 05/28/23  4:08 PM   Specimen: Right Antecubital; Blood  Result  Value Ref Range Status   Specimen Description   Final    RIGHT ANTECUBITAL BOTTLES DRAWN AEROBIC AND ANAEROBIC   Special Requests   Final    Blood Culture results may not be optimal due to an inadequate volume of blood received in culture bottles   Culture   Final    NO GROWTH < 24 HOURS Performed at [facility], [address], [place], [place] [address]    Report Status PENDING  Incomplete  Resp panel by RT-PCR (RSV, Flu A&B, Covid) Anterior Nasal Swab     Status: None   Collection Time: 05/28/23  4:10 PM   Specimen: Anterior Nasal Swab  Result Value Ref Range Status   SARS Coronavirus 2 by RT PCR NEGATIVE NEGATIVE Final    Comment: (NOTE) SARS-CoV-2 target nucleic acids are NOT DETECTED.  The SARS-CoV-2 RNA is generally detectable in upper respiratory specimens during the acute phase of infection. The lowest concentration of SARS-CoV-2 viral copies this assay can detect is 138  copies/mL. A negative result does not preclude SARS-Cov-2 infection and should not be used as the sole basis for treatment or other patient management decisions. A negative result may occur with  improper specimen collection/handling, submission of specimen other than nasopharyngeal swab, presence of viral mutation(s) within the areas targeted by this assay, and inadequate number of viral copies(<138 copies/mL). A negative result must be combined with clinical observations, patient history, and epidemiological information. The expected result is Negative.  Fact Sheet for Patients:  BloggerCourse.com  Fact Sheet for Healthcare Providers:  SeriousBroker.it  This test is no t yet approved or cleared by the [place]  FDA and  has been authorized for detection and/or diagnosis of SARS-CoV-2 by FDA under an Emergency Use Authorization (EUA). This EUA will remain  in effect (meaning this test can be used) for the duration of the COVID-19 declaration under Section 564(b)(1) of the Act, 21 U.S.C.section 360bbb-3(b)(1), unless the authorization is terminated  or revoked sooner.       Influenza A by PCR NEGATIVE NEGATIVE Final   Influenza B by PCR NEGATIVE NEGATIVE Final    Comment: (NOTE) The Xpert Xpress SARS-CoV-2/FLU/RSV plus assay is intended as an aid in the diagnosis of influenza from Nasopharyngeal swab specimens and should not be used as a sole basis for treatment. Nasal washings and aspirates are unacceptable for Xpert Xpress SARS-CoV-2/FLU/RSV testing.  Fact Sheet for Patients: BloggerCourse.com  Fact Sheet for Healthcare Providers: SeriousBroker.it  This test is not yet approved or cleared by the [place]  FDA and has been authorized for detection and/or diagnosis of SARS-CoV-2 by FDA under an Emergency Use Authorization (EUA). This EUA will remain in effect (meaning  this test can be used) for the duration of the COVID-19 declaration under Section 564(b)(1) of the Act, 21 U.S.C. section 360bbb-3(b)(1), unless the authorization is terminated or revoked.     Resp Syncytial Virus by PCR NEGATIVE NEGATIVE Final    Comment: (NOTE) Fact Sheet for Patients: BloggerCourse.com  Fact Sheet for Healthcare Providers: SeriousBroker.it  This test is not yet approved or cleared by the [place]  FDA and has been authorized for detection and/or diagnosis of SARS-CoV-2 by FDA under an Emergency Use Authorization (EUA). This EUA will remain in effect (meaning this test can be used) for the duration of the COVID-19 declaration under Section 564(b)(1) of the Act, 21 U.S.C. section 360bbb-3(b)(1), unless the authorization is terminated or revoked.  Performed at [facility], [address] [place], [place] [address]  Radiology Studies: CT ABDOMEN PELVIS WO CONTRAST Result Date: 05/28/2023 CLINICAL DATA:  Weakness, nausea, transaminitis. EXAM: CT ABDOMEN AND PELVIS WITHOUT CONTRAST TECHNIQUE: Multidetector CT imaging of the abdomen and pelvis was performed following the standard protocol without IV contrast. RADIATION DOSE REDUCTION: This exam was performed according to the departmental dose-optimization program which includes automated exposure control, adjustment of the mA and/or kV according to patient size and/or use of iterative reconstruction technique. COMPARISON:  10/21/2018 FINDINGS: Lower chest: Coronary and descending thoracic aortic atherosclerosis. Scarring in both lower lobes. Stable (from 2020) 4 mm subpleural nodule in the right middle lobe, image 6 series 3. No further imaging workup of this lesion is indicated. Hepatobiliary: Cholecystectomy.  Otherwise unremarkable. Pancreas: Unremarkable Spleen: Unremarkable Adrenals/Urinary Tract: Small right adrenal mass previously shown to be adrenal  adenoma on the CT from 10/21/2018, currently blurred by motion. No further imaging workup of this lesion is indicated. Hypodense renal lesions are blurred by motion artifact but are believed to be simple cysts based on density measurements. Suspected mild bilateral nonobstructive nephrolithiasis although vascular calcifications may be contributory to the appearance. No hydronephrosis, hydroureter, or ureteral calculus observed. Stomach/Bowel: Appendix 0.7 cm in diameter, upper normal size. No definite surrounding periappendiceal inflammatory findings. Scattered colonic diverticula. Questionable low-grade diverticulitis at the junction of the descending and sigmoid colon, with some subtle mesenteric stranding along the superior border of the bowel in this vicinity. Vascular/Lymphatic: Atherosclerosis is present, including aortoiliac atherosclerotic disease. Atheromatous plaque noted in the mesenteric vessels. Reproductive: Unremarkable Other: No supplemental non-categorized findings. Musculoskeletal: Borderline bilateral foraminal stenosis at L5-S1 due to facet arthropathy. IMPRESSION: 1. Questionable low-grade diverticulitis at the junction of the descending and sigmoid colon, with some subtle mesenteric stranding along the superior border of the bowel in this vicinity. 2. Appendix 0.7 cm in diameter, upper normal size. No definite surrounding periappendiceal inflammatory findings. 3. Small right adrenal adenoma, previously shown to be benign on the CT from 10/21/2018. No further imaging workup of this lesion is indicated. 4. Suspected mild bilateral nonobstructive nephrolithiasis although vascular calcifications may be contributory to the appearance. 5. Borderline bilateral foraminal stenosis at L5-S1 due to facet arthropathy. 6.  Aortic Atherosclerosis (ICD10-I70.0).  Coronary atherosclerosis. Electronically Signed   By: Freida Jes M.D.   On: 05/28/2023 19:41   MR BRAIN WO CONTRAST Result Date:  05/28/2023 CLINICAL DATA:  Initial evaluation for acute neuro deficit, stroke suspected. EXAM: MRI HEAD WITHOUT CONTRAST TECHNIQUE: Multiplanar, multiecho pulse sequences of the brain and surrounding structures were obtained without intravenous contrast. COMPARISON:  CT from 05/28/2023 and MRI from 03/18/2021. FINDINGS: Brain: Generalized age-related cerebral atrophy. Patchy and confluent T2/FLAIR hyperintensity involving the periventricular and deep white matter both cerebral hemispheres as well as the pons, consistent chronic small vessel ischemic disease, moderately advanced in nature. Encephalomalacia and gliosis involving the right parietal lobe, consistent with a chronic ischemic infarct. Multiple scattered chronic lacunar infarcts about the bilateral basal ganglia, thalami, and pons. No abnormal foci of restricted diffusion to suggest acute or subacute ischemia. No acute or chronic intracranial blood products. No mass lesion, midline shift or mass effect. No hydrocephalus or extra-axial fluid collection. Pituitary gland within normal limits. Vascular: Loss of normal flow void within the intradural right V4 segment, consistent with slow flow and/or occlusion. Appearance is similar as compared to previous exams. Major intracranial vascular flow voids are otherwise maintained. Skull and upper cervical spine: Craniocervical junction within normal limits. Bone marrow signal intensity normal. No scalp soft tissue abnormality. Sinuses/Orbits: Prior bilateral  ocular lens replacement. Extensive chronic frontoethmoidal, sphenoid, and right maxillary sinus disease. Trace right mastoid effusion noted, of doubtful significance. Other: None. IMPRESSION: 1. No acute intracranial abnormality. 2. Chronic right parietal infarct. 3. Underlying age-related cerebral atrophy with moderately advanced chronic microvascular ischemic disease, with multiple scattered chronic lacunar infarcts about the bilateral basal ganglia, thalami,  and pons. 4. Extensive chronic paranasal sinus disease. Electronically Signed   By: Virgia Griffins M.D.   On: 05/28/2023 19:24   CT Head Wo Contrast Result Date: 05/28/2023 CLINICAL DATA:  Worsening weakness and slurred speech. EXAM: CT HEAD WITHOUT CONTRAST TECHNIQUE: Contiguous axial images were obtained from the base of the skull through the vertex without intravenous contrast. RADIATION DOSE REDUCTION: This exam was performed according to the departmental dose-optimization program which includes automated exposure control, adjustment of the mA and/or kV according to patient size and/or use of iterative reconstruction technique. COMPARISON:  November 06, 2021 FINDINGS: Brain: There is generalized cerebral atrophy with widening of the extra-axial spaces and ventricular dilatation. There are areas of decreased attenuation within the white matter tracts of the supratentorial brain, consistent with microvascular disease changes. A chronic posterior right frontal lobe infarct is seen near the vertex. Multiple small chronic bilateral basal ganglia lacunar infarcts are noted. Vascular: Marked severity bilateral cavernous carotid artery calcification is noted. Skull: Normal. Negative for fracture or focal lesion. Sinuses/Orbits: There is marked severity right maxillary sinus, right ethmoid sinus, sphenoid sinus, frontal sinus and right-sided nasal mucosal thickening. Chronic changes are also seen involving the walls of the right maxillary sinus. Other: None. IMPRESSION: 1. Generalized cerebral atrophy with chronic white matter small vessel ischemic changes. 2. Chronic posterior right frontal lobe infarct. 3. Multiple small chronic bilateral basal ganglia lacunar infarcts. 4. Marked severity right-sided paranasal sinus disease. Electronically Signed   By: Virgle Grime M.D.   On: 05/28/2023 17:14        Scheduled Meds:  aspirin   81 mg Oral Daily   carvedilol   25 mg Oral BID   cloNIDine   0.15 mg Oral  BID   clopidogrel   75 mg Oral Daily   heparin   5,000 Units Subcutaneous Q8H   insulin  aspart  0-5 Units Subcutaneous QHS   insulin  aspart  0-9 Units Subcutaneous TID WC   insulin  glargine-yfgn  10 Units Subcutaneous Q24H   polyethylene glycol  17 g Oral Daily   Continuous Infusions:  sodium chloride  75 mL/hr at 05/28/23 2325     LOS: 0 days    Time spent: 55 minutes    Briana Hendershott Loran Rock, DO [facility]  If 7PM-7AM, please contact night-coverage [email or web address] 05/29/2023, 10:24 AM

## 2023-05-29 NOTE — Progress Notes (Signed)
 Patient has been alert and pleasant this shift.  Patient has had several small bowel movements, but admits to taking a laxative at home 4 days prior due to constipation.  Patient has also complained of headache and has been medicated per mar.  Patient only voided a small amount and bladder scan done post residual and showed >400cc,.   I&O cath performed and 600cc of urine returned.

## 2023-05-30 DIAGNOSIS — R748 Abnormal levels of other serum enzymes: Secondary | ICD-10-CM | POA: Diagnosis not present

## 2023-05-30 LAB — GLUCOSE, CAPILLARY: Glucose-Capillary: 170 mg/dL — ABNORMAL HIGH (ref 70–99)

## 2023-05-30 LAB — CBC
HCT: 32.3 % — ABNORMAL LOW (ref 36.0–46.0)
Hemoglobin: 10.1 g/dL — ABNORMAL LOW (ref 12.0–15.0)
MCH: 29.1 pg (ref 26.0–34.0)
MCHC: 31.3 g/dL (ref 30.0–36.0)
MCV: 93.1 fL (ref 80.0–100.0)
Platelets: 186 10*3/uL (ref 150–400)
RBC: 3.47 MIL/uL — ABNORMAL LOW (ref 3.87–5.11)
RDW: 14.1 % (ref 11.5–15.5)
WBC: 6.7 10*3/uL (ref 4.0–10.5)
nRBC: 0 % (ref 0.0–0.2)

## 2023-05-30 LAB — COMPREHENSIVE METABOLIC PANEL WITH GFR
ALT: 159 U/L — ABNORMAL HIGH (ref 0–44)
AST: 52 U/L — ABNORMAL HIGH (ref 15–41)
Albumin: 3 g/dL — ABNORMAL LOW (ref 3.5–5.0)
Alkaline Phosphatase: 107 U/L (ref 38–126)
Anion gap: 11 (ref 5–15)
BUN: 59 mg/dL — ABNORMAL HIGH (ref 8–23)
CO2: 18 mmol/L — ABNORMAL LOW (ref 22–32)
Calcium: 8.8 mg/dL — ABNORMAL LOW (ref 8.9–10.3)
Chloride: 105 mmol/L (ref 98–111)
Creatinine, Ser: 2.97 mg/dL — ABNORMAL HIGH (ref 0.44–1.00)
GFR, Estimated: 16 mL/min — ABNORMAL LOW (ref >60)
Glucose, Bld: 180 mg/dL — ABNORMAL HIGH (ref 70–99)
Potassium: 4.2 mmol/L (ref 3.5–5.1)
Sodium: 134 mmol/L — ABNORMAL LOW (ref 135–145)
Total Bilirubin: 0.6 mg/dL (ref 0.0–1.2)
Total Protein: 6.7 g/dL (ref 6.5–8.1)

## 2023-05-30 LAB — MAGNESIUM: Magnesium: 1.8 mg/dL (ref 1.7–2.4)

## 2023-05-30 MED ORDER — LOPERAMIDE HCL 2 MG PO CAPS
4.0000 mg | ORAL_CAPSULE | Freq: Once | ORAL | Status: AC
  Administered 2023-05-30: 4 mg via ORAL
  Filled 2023-05-30: qty 2

## 2023-05-30 NOTE — Plan of Care (Signed)

## 2023-05-30 NOTE — Discharge Summary (Signed)
 Physician Discharge Summary  Briana Garrison UJW:[id number] DOB: 10-Jun-1950 DOA: 05/28/2023  PCP: Imelda Man, MD  Admit date: 05/28/2023  Discharge date: 05/30/2023  Admitted From:Home  Disposition:  Home  Recommendations for Outpatient Follow-up:  Follow up with PCP in 1-2 weeks Please obtain CMP follow-up in 1 week to ensure LFTs are downward trending and renal function remained stable Okay to resume home medications as prior, patient does complain of significant headaches for which Fioricet has been prescribed, okay to continue this and hold other home use of Tylenol  since levels were minimally elevated  Home Health: None  Equipment/Devices: None  Discharge Condition:Stable  CODE STATUS: Full  Diet recommendation: Heart Healthy/carb modified  Brief/Interim Summary:  Briana Garrison is a 73 y.o. female with medical history significant for strokes, diabetes mellitus, hypertension, CKD 4. Patient was brought to the ED with complaints of generalized weakness over the past few weeks and headaches.  Patient has residual deficits from prior stroke including slurred speech, weakness to the left side, and tingling to the left side which is unchanged from baseline.  Patient was noted to have AKI on CKD stage IV along with elevated liver enzymes, but without any associated symptomatology.  She has had poor oral intake and further evaluation is pending.  She was recently noted to have significant Tylenol  use at home given persistent headaches as well as URI.  She was noted to have minimally elevated Tylenol  levels which were likely causing LFT elevation.  Her renal function has stabilized and LFTs are downward trending and she is eager for discharge.  She has to follow-up with PCP to have repeat labs done in the near future and excessive dose of Tylenol  has been discouraged.  No other acute events or concerns noted.  Discharge Diagnoses:  Principal Problem:   Elevated liver enzymes Active  Problems:   Acute kidney injury superimposed on chronic kidney disease (HCC)   Type 2 diabetes mellitus with hyperlipidemia (HCC)   Essential hypertension   CVA (cerebral vascular accident) (HCC)   CKD (chronic kidney disease), stage IV (HCC)   Obstructive sleep apnea  Principal discharge diagnosis: AKI on CKD stage IV along with transaminitis related to excessive acetaminophen  use.  Discharge Instructions  Discharge Instructions     Diet - low sodium heart healthy   Complete by: As directed    Increase activity slowly   Complete by: As directed       Allergies as of 05/30/2023       Reactions   Amlodipine  Swelling   Edema   Tramadol  Diarrhea, Nausea And Vomiting, Dermatitis   Acetaminophen  Other (See Comments)   Liver enzymes   Ibuprofen Other (See Comments)   Renal function        Medication List     STOP taking these medications    acetaminophen  500 MG tablet Commonly known as: TYLENOL        TAKE these medications    aspirin  81 MG chewable tablet Chew 1 tablet (81 mg total) by mouth daily. What changed: when to take this   butalbital-acetaminophen -caffeine 50-325-40 MG tablet Commonly known as: FIORICET Take 1 tablet by mouth every 4 (four) hours as needed for headache or migraine.   carvedilol  25 MG tablet Commonly known as: COREG  Take 1 tablet (25 mg total) by mouth 2 (two) times daily. What changed: additional instructions   cloNIDine  0.1 MG tablet Commonly known as: CATAPRES  Take 0.1 mg by mouth See admin instructions. Take 1 and 1/2 tablet twice  a day   clopidogrel  75 MG tablet Commonly known as: PLAVIX  Take 1 tablet (75 mg total) by mouth daily.   cyanocobalamin  1000 MCG tablet Commonly known as: VITAMIN B12 Take 1,000 mcg by mouth daily.   glucose 4 GM chewable tablet Chew 1 tablet by mouth once as needed for low blood sugar.   insulin  glargine 100 unit/mL Sopn Commonly known as: LANTUS  Inject 14 Units into the skin 2 (two) times  daily. 14 units am    6 units bedtime What changed:  how much to take when to take this additional instructions   Mounjaro 15 MG/0.5ML Pen Generic drug: tirzepatide Inject 15 mg into the skin once a week.   NovoLOG  FlexPen 100 UNIT/ML FlexPen Generic drug: insulin  aspart Inject 11-13 Units into the skin 3 (three) times daily with meals. 13 units am, 11 units noon, and 11 units pm   rosuvastatin  40 MG tablet Commonly known as: CRESTOR  Take 40 mg by mouth daily.   telmisartan 20 MG tablet Commonly known as: MICARDIS Take 20 mg by mouth daily.   Vitamin D -3 25 MCG (1000 UT) Caps Take 1,000 Units by mouth daily.        Follow-up Information     Imelda Man, MD. Schedule an appointment as soon as possible for a visit in 1 week(s).   Specialty: Internal Medicine Contact information: [address] [place] [address] [phone]                Allergies  Allergen Reactions   Amlodipine  Swelling    Edema   Tramadol  Diarrhea, Nausea And Vomiting and Dermatitis   Acetaminophen  Other (See Comments)    Liver enzymes    Ibuprofen Other (See Comments)    Renal function     Consultations: None   Procedures/Studies: CT ABDOMEN PELVIS WO CONTRAST Result Date: 05/28/2023 CLINICAL DATA:  Weakness, nausea, transaminitis. EXAM: CT ABDOMEN AND PELVIS WITHOUT CONTRAST TECHNIQUE: Multidetector CT imaging of the abdomen and pelvis was performed following the standard protocol without IV contrast. RADIATION DOSE REDUCTION: This exam was performed according to the departmental dose-optimization program which includes automated exposure control, adjustment of the mA and/or kV according to patient size and/or use of iterative reconstruction technique. COMPARISON:  10/21/2018 FINDINGS: Lower chest: Coronary and descending thoracic aortic atherosclerosis. Scarring in both lower lobes. Stable (from 2020) 4 mm subpleural nodule in the right middle lobe, image 6  series 3. No further imaging workup of this lesion is indicated. Hepatobiliary: Cholecystectomy.  Otherwise unremarkable. Pancreas: Unremarkable Spleen: Unremarkable Adrenals/Urinary Tract: Small right adrenal mass previously shown to be adrenal adenoma on the CT from 10/21/2018, currently blurred by motion. No further imaging workup of this lesion is indicated. Hypodense renal lesions are blurred by motion artifact but are believed to be simple cysts based on density measurements. Suspected mild bilateral nonobstructive nephrolithiasis although vascular calcifications may be contributory to the appearance. No hydronephrosis, hydroureter, or ureteral calculus observed. Stomach/Bowel: Appendix 0.7 cm in diameter, upper normal size. No definite surrounding periappendiceal inflammatory findings. Scattered colonic diverticula. Questionable low-grade diverticulitis at the junction of the descending and sigmoid colon, with some subtle mesenteric stranding along the superior border of the bowel in this vicinity. Vascular/Lymphatic: Atherosclerosis is present, including aortoiliac atherosclerotic disease. Atheromatous plaque noted in the mesenteric vessels. Reproductive: Unremarkable Other: No supplemental non-categorized findings. Musculoskeletal: Borderline bilateral foraminal stenosis at L5-S1 due to facet arthropathy. IMPRESSION: 1. Questionable low-grade diverticulitis at the junction of the descending and sigmoid colon, with  some subtle mesenteric stranding along the superior border of the bowel in this vicinity. 2. Appendix 0.7 cm in diameter, upper normal size. No definite surrounding periappendiceal inflammatory findings. 3. Small right adrenal adenoma, previously shown to be benign on the CT from 10/21/2018. No further imaging workup of this lesion is indicated. 4. Suspected mild bilateral nonobstructive nephrolithiasis although vascular calcifications may be contributory to the appearance. 5. Borderline bilateral  foraminal stenosis at L5-S1 due to facet arthropathy. 6.  Aortic Atherosclerosis (ICD10-I70.0).  Coronary atherosclerosis. Electronically Signed   By: Freida Jes M.D.   On: 05/28/2023 19:41   MR BRAIN WO CONTRAST Result Date: 05/28/2023 CLINICAL DATA:  Initial evaluation for acute neuro deficit, stroke suspected. EXAM: MRI HEAD WITHOUT CONTRAST TECHNIQUE: Multiplanar, multiecho pulse sequences of the brain and surrounding structures were obtained without intravenous contrast. COMPARISON:  CT from 05/28/2023 and MRI from 03/18/2021. FINDINGS: Brain: Generalized age-related cerebral atrophy. Patchy and confluent T2/FLAIR hyperintensity involving the periventricular and deep white matter both cerebral hemispheres as well as the pons, consistent chronic small vessel ischemic disease, moderately advanced in nature. Encephalomalacia and gliosis involving the right parietal lobe, consistent with a chronic ischemic infarct. Multiple scattered chronic lacunar infarcts about the bilateral basal ganglia, thalami, and pons. No abnormal foci of restricted diffusion to suggest acute or subacute ischemia. No acute or chronic intracranial blood products. No mass lesion, midline shift or mass effect. No hydrocephalus or extra-axial fluid collection. Pituitary gland within normal limits. Vascular: Loss of normal flow void within the intradural right V4 segment, consistent with slow flow and/or occlusion. Appearance is similar as compared to previous exams. Major intracranial vascular flow voids are otherwise maintained. Skull and upper cervical spine: Craniocervical junction within normal limits. Bone marrow signal intensity normal. No scalp soft tissue abnormality. Sinuses/Orbits: Prior bilateral ocular lens replacement. Extensive chronic frontoethmoidal, sphenoid, and right maxillary sinus disease. Trace right mastoid effusion noted, of doubtful significance. Other: None. IMPRESSION: 1. No acute intracranial abnormality.  2. Chronic right parietal infarct. 3. Underlying age-related cerebral atrophy with moderately advanced chronic microvascular ischemic disease, with multiple scattered chronic lacunar infarcts about the bilateral basal ganglia, thalami, and pons. 4. Extensive chronic paranasal sinus disease. Electronically Signed   By: Virgia Griffins M.D.   On: 05/28/2023 19:24   CT Head Wo Contrast Result Date: 05/28/2023 CLINICAL DATA:  Worsening weakness and slurred speech. EXAM: CT HEAD WITHOUT CONTRAST TECHNIQUE: Contiguous axial images were obtained from the base of the skull through the vertex without intravenous contrast. RADIATION DOSE REDUCTION: This exam was performed according to the departmental dose-optimization program which includes automated exposure control, adjustment of the mA and/or kV according to patient size and/or use of iterative reconstruction technique. COMPARISON:  November 06, 2021 FINDINGS: Brain: There is generalized cerebral atrophy with widening of the extra-axial spaces and ventricular dilatation. There are areas of decreased attenuation within the white matter tracts of the supratentorial brain, consistent with microvascular disease changes. A chronic posterior right frontal lobe infarct is seen near the vertex. Multiple small chronic bilateral basal ganglia lacunar infarcts are noted. Vascular: Marked severity bilateral cavernous carotid artery calcification is noted. Skull: Normal. Negative for fracture or focal lesion. Sinuses/Orbits: There is marked severity right maxillary sinus, right ethmoid sinus, sphenoid sinus, frontal sinus and right-sided nasal mucosal thickening. Chronic changes are also seen involving the walls of the right maxillary sinus. Other: None. IMPRESSION: 1. Generalized cerebral atrophy with chronic white matter small vessel ischemic changes. 2. Chronic posterior right frontal lobe infarct.  3. Multiple small chronic bilateral basal ganglia lacunar infarcts. 4. Marked  severity right-sided paranasal sinus disease. Electronically Signed   By: Virgle Grime M.D.   On: 05/28/2023 17:14     Discharge Exam: Vitals:   05/30/23 0400 05/30/23 0800  BP: 112/86 138/80  Pulse: 87   Resp: 16   Temp: 98.5 F (36.9 C)   SpO2: 99%    Vitals:   05/29/23 1420 05/29/23 2050 05/30/23 0400 05/30/23 0800  BP: (!) 111/55 131/63 112/86 138/80  Pulse: 91 92 87   Resp: 17 20 16    Temp: 98.2 F (36.8 C) (!) 97.3 F (36.3 C) 98.5 F (36.9 C)   TempSrc:  Oral Oral   SpO2: 100% 98% 99%   Weight:      Height:        General: Pt is alert, awake, not in acute distress Cardiovascular: RRR, S1/S2 +, no rubs, no gallops Respiratory: CTA bilaterally, no wheezing, no rhonchi Abdominal: Soft, NT, ND, bowel sounds + Extremities: no edema, no cyanosis    The results of significant diagnostics from this hospitalization (including imaging, microbiology, ancillary and laboratory) are listed below for reference.     Microbiology: Recent Results (from the past 240 hours)  Blood culture (routine x 2)     Status: None (Preliminary result)   Collection Time: 05/28/23  4:08 PM   Specimen: BLOOD LEFT HAND  Result Value Ref Range Status   Specimen Description BLOOD LEFT HAND AEROBIC BOTTLE ONLY  Final   Special Requests   Final    Blood Culture results may not be optimal due to an inadequate volume of blood received in culture bottles   Culture   Final    NO GROWTH 2 DAYS Performed at [facility], [address] [place], [place] [address]    Report Status PENDING  Incomplete  Blood culture (routine x 2)     Status: None (Preliminary result)   Collection Time: 05/28/23  4:08 PM   Specimen: Right Antecubital; Blood  Result Value Ref Range Status   Specimen Description   Final    RIGHT ANTECUBITAL BOTTLES DRAWN AEROBIC AND ANAEROBIC   Special Requests   Final    Blood Culture results may not be optimal due to an inadequate volume of blood received in culture bottles    Culture   Final    NO GROWTH 2 DAYS Performed at [facility], [address] [place], [place] [address]    Report Status PENDING  Incomplete  Resp panel by RT-PCR (RSV, Flu A&B, Covid) Anterior Nasal Swab     Status: None   Collection Time: 05/28/23  4:10 PM   Specimen: Anterior Nasal Swab  Result Value Ref Range Status   SARS Coronavirus 2 by RT PCR NEGATIVE NEGATIVE Final    Comment: (NOTE) SARS-CoV-2 target nucleic acids are NOT DETECTED.  The SARS-CoV-2 RNA is generally detectable in upper respiratory specimens during the acute phase of infection. The lowest concentration of SARS-CoV-2 viral copies this assay can detect is 138 copies/mL. A negative result does not preclude SARS-Cov-2 infection and should not be used as the sole basis for treatment or other patient management decisions. A negative result may occur with  improper specimen collection/handling, submission of specimen other than nasopharyngeal swab, presence of viral mutation(s) within the areas targeted by this assay, and inadequate number of viral copies(<138 copies/mL). A negative result must be combined with clinical observations, patient history, and epidemiological information. The expected result is Negative.  Fact Sheet for Patients:  BloggerCourse.com  Fact Sheet for Healthcare Providers:  SeriousBroker.it  This test is no t yet approved or cleared by the [place]  FDA and  has been authorized for detection and/or diagnosis of SARS-CoV-2 by FDA under an Emergency Use Authorization (EUA). This EUA will remain  in effect (meaning this test can be used) for the duration of the COVID-19 declaration under Section 564(b)(1) of the Act, 21 U.S.C.section 360bbb-3(b)(1), unless the authorization is terminated  or revoked sooner.       Influenza A by PCR NEGATIVE NEGATIVE Final   Influenza B by PCR NEGATIVE NEGATIVE Final    Comment: (NOTE) The Xpert  Xpress SARS-CoV-2/FLU/RSV plus assay is intended as an aid in the diagnosis of influenza from Nasopharyngeal swab specimens and should not be used as a sole basis for treatment. Nasal washings and aspirates are unacceptable for Xpert Xpress SARS-CoV-2/FLU/RSV testing.  Fact Sheet for Patients: BloggerCourse.com  Fact Sheet for Healthcare Providers: SeriousBroker.it  This test is not yet approved or cleared by the [place]  FDA and has been authorized for detection and/or diagnosis of SARS-CoV-2 by FDA under an Emergency Use Authorization (EUA). This EUA will remain in effect (meaning this test can be used) for the duration of the COVID-19 declaration under Section 564(b)(1) of the Act, 21 U.S.C. section 360bbb-3(b)(1), unless the authorization is terminated or revoked.     Resp Syncytial Virus by PCR NEGATIVE NEGATIVE Final    Comment: (NOTE) Fact Sheet for Patients: BloggerCourse.com  Fact Sheet for Healthcare Providers: SeriousBroker.it  This test is not yet approved or cleared by the [place]  FDA and has been authorized for detection and/or diagnosis of SARS-CoV-2 by FDA under an Emergency Use Authorization (EUA). This EUA will remain in effect (meaning this test can be used) for the duration of the COVID-19 declaration under Section 564(b)(1) of the Act, 21 U.S.C. section 360bbb-3(b)(1), unless the authorization is terminated or revoked.  Performed at [facility], [address], [place], [place] [address]      Labs: BNP (last 3 results) No results for input(s): "BNP" in the last 8760 hours. Basic Metabolic Panel: Recent Labs  Lab 05/28/23 1608 05/29/23 0447 05/30/23 0418  NA 132* 137 134*  K 4.4 4.2 4.2  CL 102 107 105  CO2 18* 20* 18*  GLUCOSE 168* 95 180*  BUN 60* 56* 59*  CREATININE 3.63* 2.98* 2.97*  CALCIUM  9.6 8.7* 8.8*  MG  --   --  1.8    Liver Function Tests: Recent Labs  Lab 05/28/23 1608 05/29/23 0447 05/30/23 0418  AST 470* 147* 52*  ALT 391* 237* 159*  ALKPHOS 122 96 107  BILITOT 0.6 0.8 0.6  PROT 7.3 6.2* 6.7  ALBUMIN 3.3* 2.8* 3.0*   No results for input(s): "LIPASE", "AMYLASE" in the last 168 hours. No results for input(s): "AMMONIA" in the last 168 hours. CBC: Recent Labs  Lab 05/28/23 1608 05/29/23 0447 05/30/23 0418  WBC 6.1 7.9 6.7  NEUTROABS 4.8  --   --   HGB 11.1* 9.7* 10.1*  HCT 35.1* 31.4* 32.3*  MCV 94.6 93.7 93.1  PLT 201 168 186   Cardiac Enzymes: No results for input(s): "CKTOTAL", "CKMB", "CKMBINDEX", "TROPONINI" in the last 168 hours. BNP: Invalid input(s): "POCBNP" CBG: Recent Labs  Lab 05/29/23 0726 05/29/23 1127 05/29/23 1624 05/29/23 2106 05/30/23 0722  GLUCAP 90 119* 137* 137* 170*   D-Dimer No results for input(s): "DDIMER" in the last 72 hours. Hgb A1c  Recent Labs    05/28/23 1608  HGBA1C 8.7*   Lipid Profile No results for input(s): "CHOL", "HDL", "LDLCALC", "TRIG", "CHOLHDL", "LDLDIRECT" in the last 72 hours. Thyroid  function studies No results for input(s): "TSH", "T4TOTAL", "T3FREE", "THYROIDAB" in the last 72 hours.  Invalid input(s): "FREET3" Anemia work up No results for input(s): "VITAMINB12", "FOLATE", "FERRITIN", "TIBC", "IRON", "RETICCTPCT" in the last 72 hours. Urinalysis    Component Value Date/Time   COLORURINE YELLOW 01/03/2021 0411   APPEARANCEUR CLEAR 01/03/2021 0411   LABSPEC 1.015 01/03/2021 0411   PHURINE 6.0 01/03/2021 0411   GLUCOSEU >=500 (A) 01/03/2021 0411   HGBUR NEGATIVE 01/03/2021 0411   BILIRUBINUR NEGATIVE 01/03/2021 0411   KETONESUR NEGATIVE 01/03/2021 0411   PROTEINUR NEGATIVE 01/03/2021 0411   UROBILINOGEN 0.2 06/23/2011 1100   NITRITE NEGATIVE 01/03/2021 0411   LEUKOCYTESUR SMALL (A) 01/03/2021 0411   Sepsis Labs Recent Labs  Lab 05/28/23 1608 05/29/23 0447 05/30/23 0418  WBC 6.1 7.9 6.7    Microbiology Recent Results (from the past 240 hours)  Blood culture (routine x 2)     Status: None (Preliminary result)   Collection Time: 05/28/23  4:08 PM   Specimen: BLOOD LEFT HAND  Result Value Ref Range Status   Specimen Description BLOOD LEFT HAND AEROBIC BOTTLE ONLY  Final   Special Requests   Final    Blood Culture results may not be optimal due to an inadequate volume of blood received in culture bottles   Culture   Final    NO GROWTH 2 DAYS Performed at [facility], [address], [place], [place] [address]    Report Status PENDING  Incomplete  Blood culture (routine x 2)     Status: None (Preliminary result)   Collection Time: 05/28/23  4:08 PM   Specimen: Right Antecubital; Blood  Result Value Ref Range Status   Specimen Description   Final    RIGHT ANTECUBITAL BOTTLES DRAWN AEROBIC AND ANAEROBIC   Special Requests   Final    Blood Culture results may not be optimal due to an inadequate volume of blood received in culture bottles   Culture   Final    NO GROWTH 2 DAYS Performed at [facility], [address], [place], [place] [address]    Report Status PENDING  Incomplete  Resp panel by RT-PCR (RSV, Flu A&B, Covid) Anterior Nasal Swab     Status: None   Collection Time: 05/28/23  4:10 PM   Specimen: Anterior Nasal Swab  Result Value Ref Range Status   SARS Coronavirus 2 by RT PCR NEGATIVE NEGATIVE Final    Comment: (NOTE) SARS-CoV-2 target nucleic acids are NOT DETECTED.  The SARS-CoV-2 RNA is generally detectable in upper respiratory specimens during the acute phase of infection. The lowest concentration of SARS-CoV-2 viral copies this assay can detect is 138 copies/mL. A negative result does not preclude SARS-Cov-2 infection and should not be used as the sole basis for treatment or other patient management decisions. A negative result may occur with  improper specimen collection/handling, submission of specimen other than nasopharyngeal swab, presence of  viral mutation(s) within the areas targeted by this assay, and inadequate number of viral copies(<138 copies/mL). A negative result must be combined with clinical observations, patient history, and epidemiological information. The expected result is Negative.  Fact Sheet for Patients:  BloggerCourse.com  Fact Sheet for Healthcare Providers:  SeriousBroker.it  This test is no t yet approved or cleared by the [place]  FDA and  has been  authorized for detection and/or diagnosis of SARS-CoV-2 by FDA under an Emergency Use Authorization (EUA). This EUA will remain  in effect (meaning this test can be used) for the duration of the COVID-19 declaration under Section 564(b)(1) of the Act, 21 U.S.C.section 360bbb-3(b)(1), unless the authorization is terminated  or revoked sooner.       Influenza A by PCR NEGATIVE NEGATIVE Final   Influenza B by PCR NEGATIVE NEGATIVE Final    Comment: (NOTE) The Xpert Xpress SARS-CoV-2/FLU/RSV plus assay is intended as an aid in the diagnosis of influenza from Nasopharyngeal swab specimens and should not be used as a sole basis for treatment. Nasal washings and aspirates are unacceptable for Xpert Xpress SARS-CoV-2/FLU/RSV testing.  Fact Sheet for Patients: BloggerCourse.com  Fact Sheet for Healthcare Providers: SeriousBroker.it  This test is not yet approved or cleared by the [place]  FDA and has been authorized for detection and/or diagnosis of SARS-CoV-2 by FDA under an Emergency Use Authorization (EUA). This EUA will remain in effect (meaning this test can be used) for the duration of the COVID-19 declaration under Section 564(b)(1) of the Act, 21 U.S.C. section 360bbb-3(b)(1), unless the authorization is terminated or revoked.     Resp Syncytial Virus by PCR NEGATIVE NEGATIVE Final    Comment: (NOTE) Fact Sheet for  Patients: BloggerCourse.com  Fact Sheet for Healthcare Providers: SeriousBroker.it  This test is not yet approved or cleared by the [place]  FDA and has been authorized for detection and/or diagnosis of SARS-CoV-2 by FDA under an Emergency Use Authorization (EUA). This EUA will remain in effect (meaning this test can be used) for the duration of the COVID-19 declaration under Section 564(b)(1) of the Act, 21 U.S.C. section 360bbb-3(b)(1), unless the authorization is terminated or revoked.  Performed at [facility], [address] [place], [place] [address]      Time coordinating discharge: 35 minutes  SIGNED:   Cornelius Dill, DO [facility] 05/30/2023, 9:04 AM  If 7PM-7AM, please contact night-coverage [email or web address]

## 2023-06-02 LAB — CULTURE, BLOOD (ROUTINE X 2)
Culture: NO GROWTH
Culture: NO GROWTH

## 2023-08-13 ENCOUNTER — Encounter (HOSPITAL_COMMUNITY): Payer: Self-pay

## 2023-08-13 ENCOUNTER — Emergency Department (HOSPITAL_COMMUNITY)
Admission: EM | Admit: 2023-08-13 | Discharge: 2023-08-13 | Disposition: A | Discharge status: Home / Self Care | Attending: Emergency Medicine | Admitting: Emergency Medicine

## 2023-08-13 ENCOUNTER — Other Ambulatory Visit: Payer: Self-pay

## 2023-08-13 DIAGNOSIS — N189 Chronic kidney disease, unspecified: Secondary | ICD-10-CM | POA: Insufficient documentation

## 2023-08-13 DIAGNOSIS — Z7982 Long term (current) use of aspirin: Secondary | ICD-10-CM | POA: Insufficient documentation

## 2023-08-13 DIAGNOSIS — R040 Epistaxis: Secondary | ICD-10-CM | POA: Diagnosis not present

## 2023-08-13 DIAGNOSIS — Z7901 Long term (current) use of anticoagulants: Secondary | ICD-10-CM | POA: Insufficient documentation

## 2023-08-13 DIAGNOSIS — E1122 Type 2 diabetes mellitus with diabetic chronic kidney disease: Secondary | ICD-10-CM | POA: Diagnosis not present

## 2023-08-13 DIAGNOSIS — Z8673 Personal history of transient ischemic attack (TIA), and cerebral infarction without residual deficits: Secondary | ICD-10-CM | POA: Diagnosis not present

## 2023-08-13 LAB — CBC
HCT: 32.4 % — ABNORMAL LOW (ref 36.0–46.0)
Hemoglobin: 10.3 g/dL — ABNORMAL LOW (ref 12.0–15.0)
MCH: 29.4 pg (ref 26.0–34.0)
MCHC: 31.8 g/dL (ref 30.0–36.0)
MCV: 92.6 fL (ref 80.0–100.0)
Platelets: 214 K/uL (ref 150–400)
RBC: 3.5 MIL/uL — ABNORMAL LOW (ref 3.87–5.11)
RDW: 13.8 % (ref 11.5–15.5)
WBC: 6.6 K/uL (ref 4.0–10.5)
nRBC: 0 % (ref 0.0–0.2)

## 2023-08-13 LAB — BASIC METABOLIC PANEL WITH GFR
Anion gap: 9 (ref 5–15)
BUN: 41 mg/dL — ABNORMAL HIGH (ref 8–23)
CO2: 20 mmol/L — ABNORMAL LOW (ref 22–32)
Calcium: 9.8 mg/dL (ref 8.9–10.3)
Chloride: 107 mmol/L (ref 98–111)
Creatinine, Ser: 2.36 mg/dL — ABNORMAL HIGH (ref 0.44–1.00)
GFR, Estimated: 21 mL/min — ABNORMAL LOW (ref >60)
Glucose, Bld: 114 mg/dL — ABNORMAL HIGH (ref 70–99)
Potassium: 4.3 mmol/L (ref 3.5–5.1)
Sodium: 136 mmol/L (ref 135–145)

## 2023-08-13 MED ORDER — OXYMETAZOLINE HCL 0.05 % NA SOLN: 1.0000 | Freq: Once | NASAL | Status: DC

## 2023-08-13 MED ORDER — OXYMETAZOLINE HCL 0.05 % NA SOLN
1.0000 | Freq: Once | NASAL | Status: AC
  Administered 2023-08-13: 1 via NASAL
  Filled 2023-08-13: qty 30

## 2023-08-13 MED ORDER — BACITRACIN ZINC 500 UNIT/GM EX OINT: 1.0000 | TOPICAL_OINTMENT | Freq: Two times a day (BID) | CUTANEOUS | 0 refills | Status: DC

## 2023-08-13 MED ORDER — OXYMETAZOLINE HCL 0.05 % NA SOLN: 1.0000 | Freq: Two times a day (BID) | NASAL | 0 refills | Status: AC

## 2023-08-13 NOTE — ED Provider Notes (Signed)
 [place] EMERGENCY DEPARTMENT AT [facility] Provider Note   CSN: [id number] Arrival date & time: 08/13/23  1101     Patient presents with: Epistaxis   Briana Garrison is a 73 y.o. female.   73 year old female presenting with epistaxis.  Patient reports nosebleed began yesterday around 4 PM and lasted till a little after midnight, she is on Plavix .  She also had a trickling mild bleed today as compared to yesterday, prompting her to come to the emergency department.  She was previously diagnosed with nasal polyps and planned to undergo surgery for this but was told that surgery was not ideal given her history of strokes, she was started on Dupixent for chronic rhinosinusitis with nasal polyps by her otolaryngologist.  She reports feeling generally weak with a mild headache but denies lightheadedness, syncope.  No other complaints.   Epistaxis      Prior to Admission medications   Medication Sig Start Date End Date Taking? Authorizing Provider  aspirin  81 MG chewable tablet Chew 1 tablet (81 mg total) by mouth daily. Patient taking differently: Chew 81 mg by mouth in the morning and at bedtime. 03/21/21   Akula, Vijaya, MD  butalbital-acetaminophen -caffeine (FIORICET) 50-325-40 MG tablet Take 1 tablet by mouth every 4 (four) hours as needed for headache or migraine.    [provider]  carvedilol  (COREG ) 25 MG tablet Take 1 tablet (25 mg total) by mouth 2 (two) times daily. Patient taking differently: Take 25 mg by mouth 2 (two) times daily. Take 1/2 tablet twice daily 01/04/21   Ricky Fines, MD  Cholecalciferol  (VITAMIN D -3) 25 MCG (1000 UT) CAPS Take 1,000 Units by mouth daily.     [provider]  cloNIDine  (CATAPRES ) 0.1 MG tablet Take 0.1 mg by mouth See admin instructions. Take 1 and 1/2 tablet twice a day    [provider]  clopidogrel  (PLAVIX ) 75 MG tablet Take 1 tablet (75 mg total) by mouth daily. 04/21/21   Cherlyn Labella, MD  glucose  4 GM chewable tablet Chew 1 tablet by mouth once as needed for low blood sugar.    [provider]  insulin  glargine (LANTUS ) 100 unit/mL SOPN Inject 14 Units into the skin 2 (two) times daily. 14 units am    6 units bedtime Patient taking differently: Inject 18 Units into the skin daily. 18 units am 01/04/21   Ricky Fines, MD  MOUNJARO 15 MG/0.5ML Pen Inject 15 mg into the skin once a week.    [provider]  NOVOLOG  FLEXPEN 100 UNIT/ML FlexPen Inject 11-13 Units into the skin 3 (three) times daily with meals. 13 units am, 11 units noon, and 11 units pm 07/26/21   [provider]  rosuvastatin  (CRESTOR ) 40 MG tablet Take 40 mg by mouth daily. 03/14/21   [provider]  telmisartan (MICARDIS) 20 MG tablet Take 20 mg by mouth daily.  08/30/19   [provider]  vitamin B-12 (CYANOCOBALAMIN ) 1000 MCG tablet Take 1,000 mcg by mouth daily.    [provider]    Allergies: Amlodipine , Tramadol , Acetaminophen , and Ibuprofen    Review of Systems  HENT:  Positive for nosebleeds.     Updated Vital Signs BP 121/70 (BP Location: Right Arm)   Pulse 88   Temp 98.4 F (36.9 C)   Resp 20   Ht 5' 1 (1.549 m)   Wt 81.6 kg   SpO2 100%   BMI 33.99 kg/m   Physical Exam Vitals and  nursing note reviewed.  HENT:     Head: Normocephalic.     Comments: Dried blood noted in right nare, nasal polyps visualized, no active bleeding Eyes:     Extraocular Movements: Extraocular movements intact.  Cardiovascular:     Rate and Rhythm: Normal rate.  Pulmonary:     Effort: Pulmonary effort is normal.  Musculoskeletal:     Cervical back: Normal range of motion.     Comments: Moves all extremities spontaneously without difficulty  Skin:    General: Skin is warm and dry.  Neurological:     Mental Status: She is alert. Mental status is at baseline.     (all labs ordered are listed, but only abnormal results are displayed) Labs Reviewed  CBC -  Abnormal; Notable for the following components:      Result Value   RBC 3.50 (*)    Hemoglobin 10.3 (*)    HCT 32.4 (*)    All other components within normal limits  BASIC METABOLIC PANEL WITH GFR - Abnormal; Notable for the following components:   CO2 20 (*)    Glucose, Bld 114 (*)    BUN 41 (*)    Creatinine, Ser 2.36 (*)    GFR, Estimated 21 (*)    All other components within normal limits    EKG: None  Radiology: No results found.   Procedures   Medications Ordered in the ED  oxymetazoline  (AFRIN) 0.05 % nasal spray 1 spray (1 spray Each Nare Given 08/13/23 1235)                                    Medical Decision Making This patient presents to the ED for concern of epistaxis, this involves an extensive number of treatment options, and is a complaint that carries with it a high risk of complications and morbidity.  The differential diagnosis includes anterior epistaxis, posterior epistaxis, anemia secondary to epistaxis, nasal polyps.   Co morbidities that complicate the patient evaluation  Type 2 diabetes, CKD, history of nasal polyps on Dupixent   Additional history obtained:  Additional history obtained from record review External records from outside source obtained and reviewed including prior otolaryngology notes   Lab Tests:  I Ordered, and personally interpreted labs.  The pertinent results include: CBC notable for anemia with hemoglobin of 10.3, however this is largely stable as compared to previous.  BMP notable for poor kidney function with creatinine of 2.36, however this is similar to patient's baseline.   Cardiac Monitoring: / EKG:  The patient was maintained on a cardiac monitor.  I personally viewed and interpreted the cardiac monitored which showed an underlying rhythm of: NSR   Problem List / ED Course / Critical interventions / Medication management   I ordered medication including Afrin  for nosebleed  Reevaluation of the patient  after these medicines showed that the patient improved I have reviewed the patients home medicines and have made adjustments as needed   Social Determinants of Health:  Social isolation   Test / Admission - Considered:  Physical exam notable as above, Afrin given in the patient's affected nostril, there was minimal subsequent bleeding but this has since resolved.  Labs are reassuring as above.  Patient's episode of epistaxis seems self-limited, I advised her to follow-up with her otolaryngologist if her symptoms persist.  Will prescribe Afrin to be used in the event that epistaxis recurs, recommend  bacitracin  applied into the nares daily to keep the nares moist.  Recommend use of humidifier overnight to keep nares moist and avoid further episodes of epistaxis.  Patient voiced understanding is in agreement with this plan.  Return precautions discussed, she is appropriate discharge at this time.    Amount and/or Complexity of Data Reviewed Labs: ordered.  Risk OTC drugs.        Final diagnoses:  Epistaxis    ED Discharge Orders          Ordered    bacitracin  ointment  2 times daily        08/13/23 1404    oxymetazoline  (AFRIN NASAL SPRAY) 0.05 % nasal spray  2 times daily        08/13/23 [address], [place] 08/13/23 1408    Cleotilde Rogue, MD 08/14/23 1734

## 2023-08-13 NOTE — ED Triage Notes (Signed)
 Pt arrived via POV c/o epistaxis since yesterday. Pt reports upon arrival to Triage, this is the first time her nose has stopped bleeding. Pt denies any nasal trauma. Pt denies blood thinners.

## 2023-08-13 NOTE — Discharge Instructions (Addendum)
 If you experience a recurrence of your nosebleed, apply pressure to the nose while leaning forward.  If your nosebleed does not resolve, you may instill 1 spray of Afrin into the affected nostril for relief.  I have prescribed bacitracin  ointment, apply a small amount into your nostrils daily to keep the area moist to avoid further nosebleeds.  You may use a humidifier at night to keep your nostrils moist and to avoid further nosebleeds.  Follow-up with Dr. Luciano if you continue to have recurrence of your nosebleeds. Return to the emergency department if your symptoms worsen.

## 2023-08-14 ENCOUNTER — Encounter (INDEPENDENT_AMBULATORY_CARE_PROVIDER_SITE_OTHER): Payer: Self-pay | Admitting: *Deleted

## 2023-09-07 DIAGNOSIS — Z1231 Encounter for screening mammogram for malignant neoplasm of breast: Secondary | ICD-10-CM | POA: Diagnosis not present

## 2023-09-07 DIAGNOSIS — Z01419 Encounter for gynecological examination (general) (routine) without abnormal findings: Secondary | ICD-10-CM | POA: Diagnosis not present

## 2023-09-07 DIAGNOSIS — Z6831 Body mass index (BMI) 31.0-31.9, adult: Secondary | ICD-10-CM | POA: Diagnosis not present

## 2023-10-03 ENCOUNTER — Emergency Department (HOSPITAL_COMMUNITY)
Admission: EM | Admit: 2023-10-03 | Discharge: 2023-10-04 | Disposition: A | Discharge status: Home / Self Care | Attending: Emergency Medicine | Admitting: Emergency Medicine

## 2023-10-03 ENCOUNTER — Encounter (HOSPITAL_COMMUNITY): Payer: Self-pay | Admitting: Emergency Medicine

## 2023-10-03 DIAGNOSIS — R0602 Shortness of breath: Secondary | ICD-10-CM | POA: Diagnosis not present

## 2023-10-03 DIAGNOSIS — I10 Essential (primary) hypertension: Secondary | ICD-10-CM | POA: Diagnosis not present

## 2023-10-03 DIAGNOSIS — R9082 White matter disease, unspecified: Secondary | ICD-10-CM | POA: Diagnosis not present

## 2023-10-03 DIAGNOSIS — G4489 Other headache syndrome: Secondary | ICD-10-CM | POA: Diagnosis not present

## 2023-10-03 DIAGNOSIS — J324 Chronic pansinusitis: Secondary | ICD-10-CM | POA: Diagnosis not present

## 2023-10-03 DIAGNOSIS — R509 Fever, unspecified: Secondary | ICD-10-CM | POA: Diagnosis not present

## 2023-10-03 DIAGNOSIS — J019 Acute sinusitis, unspecified: Secondary | ICD-10-CM | POA: Diagnosis not present

## 2023-10-03 DIAGNOSIS — E119 Type 2 diabetes mellitus without complications: Secondary | ICD-10-CM | POA: Insufficient documentation

## 2023-10-03 DIAGNOSIS — J329 Chronic sinusitis, unspecified: Secondary | ICD-10-CM | POA: Diagnosis not present

## 2023-10-03 DIAGNOSIS — R070 Pain in throat: Secondary | ICD-10-CM | POA: Diagnosis not present

## 2023-10-03 DIAGNOSIS — R519 Headache, unspecified: Secondary | ICD-10-CM | POA: Diagnosis not present

## 2023-10-03 DIAGNOSIS — Z8673 Personal history of transient ischemic attack (TIA), and cerebral infarction without residual deficits: Secondary | ICD-10-CM | POA: Insufficient documentation

## 2023-10-03 DIAGNOSIS — R059 Cough, unspecified: Secondary | ICD-10-CM | POA: Insufficient documentation

## 2023-10-03 DIAGNOSIS — Z79899 Other long term (current) drug therapy: Secondary | ICD-10-CM | POA: Diagnosis not present

## 2023-10-03 DIAGNOSIS — I959 Hypotension, unspecified: Secondary | ICD-10-CM | POA: Diagnosis not present

## 2023-10-03 DIAGNOSIS — R531 Weakness: Secondary | ICD-10-CM | POA: Diagnosis not present

## 2023-10-03 NOTE — ED Triage Notes (Addendum)
 Pt been feeling unwell for 2 weeks. States she has been in communication with PCP. Seen at [facility] today px antibiotics for URI. Pt states she has headache, clogged ears, nasal congestion. Afebrile and denies sore throat or cough. Pt is alert and shows no signs of distress. Does have hot potato voice; unsure if baseline.

## 2023-10-04 ENCOUNTER — Emergency Department (HOSPITAL_COMMUNITY)

## 2023-10-04 DIAGNOSIS — J324 Chronic pansinusitis: Secondary | ICD-10-CM | POA: Diagnosis not present

## 2023-10-04 DIAGNOSIS — Z79899 Other long term (current) drug therapy: Secondary | ICD-10-CM | POA: Diagnosis not present

## 2023-10-04 DIAGNOSIS — R519 Headache, unspecified: Secondary | ICD-10-CM | POA: Diagnosis not present

## 2023-10-04 DIAGNOSIS — E119 Type 2 diabetes mellitus without complications: Secondary | ICD-10-CM | POA: Diagnosis not present

## 2023-10-04 DIAGNOSIS — R509 Fever, unspecified: Secondary | ICD-10-CM | POA: Diagnosis not present

## 2023-10-04 DIAGNOSIS — R9082 White matter disease, unspecified: Secondary | ICD-10-CM | POA: Diagnosis not present

## 2023-10-04 DIAGNOSIS — R059 Cough, unspecified: Secondary | ICD-10-CM | POA: Diagnosis not present

## 2023-10-04 DIAGNOSIS — I10 Essential (primary) hypertension: Secondary | ICD-10-CM | POA: Diagnosis not present

## 2023-10-04 LAB — RESP PANEL BY RT-PCR (RSV, FLU A&B, COVID)  RVPGX2
Influenza A by PCR: NEGATIVE
Influenza B by PCR: NEGATIVE
Resp Syncytial Virus by PCR: NEGATIVE
SARS Coronavirus 2 by RT PCR: NEGATIVE

## 2023-10-04 LAB — CBC
HCT: 33.6 % — ABNORMAL LOW (ref 36.0–46.0)
Hemoglobin: 10.5 g/dL — ABNORMAL LOW (ref 12.0–15.0)
MCH: 28.8 pg (ref 26.0–34.0)
MCHC: 31.3 g/dL (ref 30.0–36.0)
MCV: 92.1 fL (ref 80.0–100.0)
Platelets: 237 K/uL (ref 150–400)
RBC: 3.65 MIL/uL — ABNORMAL LOW (ref 3.87–5.11)
RDW: 14 % (ref 11.5–15.5)
WBC: 8.6 K/uL (ref 4.0–10.5)
nRBC: 0 % (ref 0.0–0.2)

## 2023-10-04 LAB — COMPREHENSIVE METABOLIC PANEL WITH GFR
ALT: 62 U/L — ABNORMAL HIGH (ref 0–44)
AST: 39 U/L (ref 15–41)
Albumin: 2.9 g/dL — ABNORMAL LOW (ref 3.5–5.0)
Alkaline Phosphatase: 76 U/L (ref 38–126)
Anion gap: 11 (ref 5–15)
BUN: 46 mg/dL — ABNORMAL HIGH (ref 8–23)
CO2: 22 mmol/L (ref 22–32)
Calcium: 9.5 mg/dL (ref 8.9–10.3)
Chloride: 101 mmol/L (ref 98–111)
Creatinine, Ser: 2.72 mg/dL — ABNORMAL HIGH (ref 0.44–1.00)
GFR, Estimated: 18 mL/min — ABNORMAL LOW (ref >60)
Glucose, Bld: 118 mg/dL — ABNORMAL HIGH (ref 70–99)
Potassium: 4.5 mmol/L (ref 3.5–5.1)
Sodium: 134 mmol/L — ABNORMAL LOW (ref 135–145)
Total Bilirubin: 0.5 mg/dL (ref 0.0–1.2)
Total Protein: 7.2 g/dL (ref 6.5–8.1)

## 2023-10-04 MED ORDER — MORPHINE SULFATE (PF) 4 MG/ML IV SOLN
4.0000 mg | Freq: Once | INTRAVENOUS | Status: AC
  Administered 2023-10-04: 4 mg via INTRAVENOUS
  Filled 2023-10-04: qty 1

## 2023-10-04 MED ORDER — PREDNISONE 20 MG PO TABS: 40.0000 mg | ORAL_TABLET | Freq: Every day | ORAL | 0 refills | Status: AC

## 2023-10-04 MED ORDER — DIPHENHYDRAMINE HCL 50 MG/ML IJ SOLN
25.0000 mg | Freq: Once | INTRAMUSCULAR | Status: AC
  Administered 2023-10-04: 25 mg via INTRAVENOUS
  Filled 2023-10-04: qty 1

## 2023-10-04 MED ORDER — SODIUM CHLORIDE 0.9 % IV BOLUS
1000.0000 mL | Freq: Once | INTRAVENOUS | Status: AC
  Administered 2023-10-04: 1000 mL via INTRAVENOUS

## 2023-10-04 MED ORDER — PROCHLORPERAZINE EDISYLATE 10 MG/2ML IJ SOLN
10.0000 mg | Freq: Once | INTRAMUSCULAR | Status: AC
  Administered 2023-10-04: 10 mg via INTRAVENOUS
  Filled 2023-10-04: qty 2

## 2023-10-04 MED ORDER — PROCHLORPERAZINE MALEATE 10 MG PO TABS
10.0000 mg | ORAL_TABLET | Freq: Once | ORAL | Status: AC
  Administered 2023-10-04: 10 mg via ORAL
  Filled 2023-10-04 (×2): qty 1

## 2023-10-04 MED ORDER — METHYLPREDNISOLONE SODIUM SUCC 125 MG IJ SOLR
125.0000 mg | Freq: Once | INTRAMUSCULAR | Status: AC
  Administered 2023-10-04: 125 mg via INTRAVENOUS
  Filled 2023-10-04: qty 2

## 2023-10-04 MED ORDER — OXYCODONE HCL 5 MG PO TABS: 5.0000 mg | ORAL_TABLET | ORAL | 0 refills | Status: AC | PRN

## 2023-10-04 MED ORDER — DIPHENHYDRAMINE HCL 25 MG PO CAPS
25.0000 mg | ORAL_CAPSULE | Freq: Once | ORAL | Status: AC
  Administered 2023-10-04: 25 mg via ORAL
  Filled 2023-10-04: qty 1

## 2023-10-04 NOTE — Discharge Instructions (Signed)
 You were evaluated in the Emergency Department and after careful evaluation, we did not find any emergent condition requiring admission or further testing in the hospital.  Your exam/testing today is overall reassuring.  Symptoms likely due to a sinus infection.  Continue the Augmentin antibiotic.  Use the prednisone  medication daily to help with the inflammation and swelling.  Keep an eye on your blood sugar while on this medicine.  Can use the oxycodone  for more significant pain keeping you from sleeping.  Follow-up with your ENT.  Please return to the Emergency Department if you experience any worsening of your condition.   Thank you for allowing us  to be a part of your care.

## 2023-10-04 NOTE — ED Notes (Signed)
Pt reports feeling "a lot better"

## 2023-10-04 NOTE — ED Provider Notes (Signed)
 [facility] Emergency Department Provider Note MRN:  [id number]  Arrival date & time: 10/04/23     Chief Complaint   Headache History of Present Illness   Briana Garrison is a 73 y.o. year-old female with a history of hypertension, diabetes presenting to the ED with chief complaint of headache.  Headache, nasal congestion, malaise, fatigue, cough, shortness of breath.  Sick contacts at home.  Review of Systems  A thorough review of systems was obtained and all systems are negative except as noted in the HPI and PMH.   Patient's Health History    Past Medical History:  Diagnosis Date   Cancer of kidney (HCC) 05/14/2009   Diabetes mellitus    Endometrial mass    GERD (gastroesophageal reflux disease)    Hypercholesterolemia    Hypertension    Internal hemorrhoids    S/P colonoscopy 06/10/00 by Dr. Geroge   Shingles    Stroke [facility]) 09/2019   2 strokes    Past Surgical History:  Procedure Laterality Date   BIOPSY  09/06/2018   Procedure: BIOPSY;  Surgeon: Golda Claudis PENNER, MD;  Location: [facility] ENDO SUITE;  Service: Endoscopy;;   CHOLECYSTECTOMY  11/13/08   COLONOSCOPY N/A 09/06/2018   Procedure: COLONOSCOPY;  Surgeon: Golda Claudis PENNER, MD;  Location: [facility] ENDO SUITE;  Service: Endoscopy;  Laterality: N/A;  730   CYSTOSCOPY KIDNEY W/ URETERAL GUIDE WIRE  05/14/09   Diagnostic hysteroscopy, D&C, endometrial polypectomy  07/01/2005   LIPECTOMY     POLYPECTOMY  09/06/2018   Procedure: POLYPECTOMY;  Surgeon: Golda Claudis PENNER, MD;  Location: [facility] ENDO SUITE;  Service: Endoscopy;;    Family History  Problem Relation Age of Onset   Ovarian cancer Mother    Stroke Mother    Stroke Father    Stroke Brother    Stroke Sister     Social History   Socioeconomic History   Marital status: Married    Spouse name: Nancyann   Number of children: 2   Years of education: 12   Highest education level: Not on file  Occupational History   Occupation: Retired Psychologist, 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    Employer: RETIRED  Tobacco Use   Smoking status: Never    Passive exposure: Never   Smokeless tobacco: Never  Vaping Use   Vaping status: Never Used  Substance and Sexual Activity   Alcohol use: No   Drug use: No   Sexual activity: Not on file  Other Topics Concern   Not on file  Social History Narrative   Lives with Husband and daughter   Right Handed   Drinks no caffeine daily   Social Drivers of Corporate investment banker Strain: Not on file  Food Insecurity: No Food Insecurity (05/28/2023)   Hunger Vital Sign    Worried About Running Out of Food in the Last Year: Never true    Ran Out of Food in the Last Year: Never true  Transportation Needs: No Transportation Needs (05/28/2023)   PRAPARE - Administrator, Civil Service (Medical): No    Lack of Transportation (Non-Medical): No  Physical Activity: Not on file  Stress: Not on file  Social Connections: Moderately Isolated (05/28/2023)   Social Connection and Isolation Panel    Frequency of Communication with Friends and Family: Never    Frequency of Social Gatherings with Friends and Family: Never    Attends Religious Services: More than 4 times per year  Active Member of Clubs or Organizations: No    Attends Banker Meetings: Never    Marital Status: Married  Catering manager Violence: Not At Risk (05/28/2023)   Humiliation, Afraid, Rape, and Kick questionnaire    Fear of Current or Ex-Partner: No    Emotionally Abused: No    Physically Abused: No    Sexually Abused: No     Physical Exam   Vitals:   10/04/23 0030 10/04/23 0200  BP: 121/61 139/64  Pulse: 88 83  Resp:  15  Temp:    SpO2: 97% 94%    CONSTITUTIONAL: Well-appearing, NAD NEURO/PSYCH:  Alert and oriented x 3, no focal deficits EYES:  eyes equal and reactive ENT/NECK:  no LAD, no JVD CARDIO: Regular rate, well-perfused, normal S1 and S2 PULM:  CTAB no wheezing or rhonchi GI/GU:   non-distended, non-tender MSK/SPINE:  No gross deformities, no edema SKIN:  no rash, atraumatic   *Additional and/or pertinent findings included in MDM below  Diagnostic and Interventional Summary    EKG Interpretation Date/Time:  Sunday October 04 2023 01:07:24 EDT Ventricular Rate:  83 PR Interval:  177 QRS Duration:  77 QT Interval:  376 QTC Calculation: 442 R Axis:   43  Text Interpretation: Sinus rhythm Low voltage, precordial leads Borderline repolarization abnormality Confirmed by Theadore Sharper [phone]) on 10/04/2023 1:27:08 AM       Labs Reviewed  CBC - Abnormal; Notable for the following components:      Result Value   RBC 3.65 (*)    Hemoglobin 10.5 (*)    HCT 33.6 (*)    All other components within normal limits  COMPREHENSIVE METABOLIC PANEL WITH GFR - Abnormal; Notable for the following components:   Sodium 134 (*)    Glucose, Bld 118 (*)    BUN 46 (*)    Creatinine, Ser 2.72 (*)    Albumin 2.9 (*)    ALT 62 (*)    GFR, Estimated 18 (*)    All other components within normal limits  RESP PANEL BY RT-PCR (RSV, FLU A&B, COVID)  RVPGX2    CT HEAD WO CONTRAST ( )  Final Result    DG Chest Port 1 View  Final Result      Medications  diphenhydrAMINE  (BENADRYL ) capsule 25 mg (25 mg Oral Given 10/04/23 0122)  prochlorperazine  (COMPAZINE ) tablet 10 mg (10 mg Oral Given 10/04/23 0133)  diphenhydrAMINE  (BENADRYL ) injection 25 mg (25 mg Intravenous Given 10/04/23 0227)  prochlorperazine  (COMPAZINE ) injection 10 mg (10 mg Intravenous Given 10/04/23 0227)  sodium chloride  0.9 % bolus 1,000 mL (0 mLs Intravenous Stopped 10/04/23 0325)  methylPREDNISolone  sodium succinate (SOLU-MEDROL ) 125 mg/2 mL injection 125 mg (125 mg Intravenous Given 10/04/23 0230)  morphine  (PF) 4 MG/ML injection 4 mg (4 mg Intravenous Given 10/04/23 0247)     Procedures  /  Critical Care Procedures  ED Course and Medical Decision Making  Initial Impression and Ddx Differential diagnosis includes  sinusitis, viral illness, bacterial pneumonia.  Does not normally experience headaches and so CT head to evaluate for mass or bleeding.  Screening labs for electrolyte disturbance.  Past medical/surgical history that increases complexity of ED encounter: Diabetes  Interpretation of Diagnostics I personally reviewed the Laboratory Testing and my interpretation is as follows: No significant blood count or electrolyte disturbance.  CT showing sinusitis.  Patient Reassessment and Ultimate Disposition/Management Clinical Course as of 10/04/23 0334  [place] Oct 04, 2023  0208 CT imaging results discussed with ENT, favored to  be chronic, not consistent with acute bone infection which makes sense given patient's well-appearing nature, lack of fever, no leukocytosis.  Patient still having headache and family concerned that she is dehydrated, providing fluids, migraine cocktail, will reassess. [MB]    Clinical Course User Index [MB] Theadore Ozell HERO, MD     Patient feeling better, vitals normal, appropriate for discharge.  Patient management required discussion with the following services or consulting groups:  ENT/Plastic Surgery  Complexity of Problems Addressed Acute illness or injury that poses threat of life of bodily function  Additional Data Reviewed and Analyzed Further history obtained from: Further history from spouse/family member  Additional Factors Impacting ED Encounter Risk Prescriptions and Consideration of hospitalization  Ozell HERO. Theadore, MD [facility] Emergency Medicine [facility] [email or web address]  Final Clinical Impressions(s) / ED Diagnoses     ICD-10-CM   1. Sinusitis, unspecified chronicity, unspecified location  J32.9       ED Discharge Orders          Ordered    oxyCODONE  (ROXICODONE ) 5 MG immediate release tablet  Every 4 hours PRN        10/04/23 0334    predniSONE  (DELTASONE ) 20 MG tablet  Daily        10/04/23 0334              Discharge Instructions Discussed with and Provided to Patient:     Discharge Instructions      You were evaluated in the Emergency Department and after careful evaluation, we did not find any emergent condition requiring admission or further testing in the hospital.  Your exam/testing today is overall reassuring.  Symptoms likely due to a sinus infection.  Continue the Augmentin antibiotic.  Use the prednisone  medication daily to help with the inflammation and swelling.  Keep an eye on your blood sugar while on this medicine.  Can use the oxycodone  for more significant pain keeping you from sleeping.  Follow-up with your ENT.  Please return to the Emergency Department if you experience any worsening of your condition.   Thank you for allowing us  to be a part of your care.       Theadore Ozell HERO, MD 10/04/23 [phone]

## 2023-10-05 NOTE — Telephone Encounter (Signed)
 Pt would like to know if a prescription for her Dupixent can be sent to her pharmacy due to her upcoming injection on Friday.Please call her at [phone].

## 2023-10-12 DIAGNOSIS — J324 Chronic pansinusitis: Secondary | ICD-10-CM | POA: Diagnosis not present

## 2023-10-12 DIAGNOSIS — J339 Nasal polyp, unspecified: Secondary | ICD-10-CM | POA: Diagnosis not present

## 2023-10-12 DIAGNOSIS — Z7901 Long term (current) use of anticoagulants: Secondary | ICD-10-CM | POA: Diagnosis not present

## 2023-10-12 DIAGNOSIS — Z8673 Personal history of transient ischemic attack (TIA), and cerebral infarction without residual deficits: Secondary | ICD-10-CM | POA: Diagnosis not present

## 2023-10-22 DIAGNOSIS — Z794 Long term (current) use of insulin: Secondary | ICD-10-CM | POA: Diagnosis not present

## 2023-10-22 DIAGNOSIS — E1140 Type 2 diabetes mellitus with diabetic neuropathy, unspecified: Secondary | ICD-10-CM | POA: Diagnosis not present

## 2023-10-22 DIAGNOSIS — I10 Essential (primary) hypertension: Secondary | ICD-10-CM | POA: Diagnosis not present

## 2023-10-22 DIAGNOSIS — E7800 Pure hypercholesterolemia, unspecified: Secondary | ICD-10-CM | POA: Diagnosis not present

## 2023-10-22 DIAGNOSIS — Z8673 Personal history of transient ischemic attack (TIA), and cerebral infarction without residual deficits: Secondary | ICD-10-CM | POA: Diagnosis not present

## 2023-11-01 DIAGNOSIS — H6692 Otitis media, unspecified, left ear: Secondary | ICD-10-CM | POA: Diagnosis not present

## 2023-11-05 DIAGNOSIS — H9202 Otalgia, left ear: Secondary | ICD-10-CM | POA: Diagnosis not present

## 2023-11-05 DIAGNOSIS — J029 Acute pharyngitis, unspecified: Secondary | ICD-10-CM | POA: Diagnosis not present

## 2023-11-05 DIAGNOSIS — H6993 Unspecified Eustachian tube disorder, bilateral: Secondary | ICD-10-CM | POA: Diagnosis not present

## 2023-12-15 DIAGNOSIS — E1140 Type 2 diabetes mellitus with diabetic neuropathy, unspecified: Secondary | ICD-10-CM | POA: Diagnosis not present

## 2023-12-15 DIAGNOSIS — N184 Chronic kidney disease, stage 4 (severe): Secondary | ICD-10-CM | POA: Diagnosis not present

## 2023-12-28 DIAGNOSIS — E7800 Pure hypercholesterolemia, unspecified: Secondary | ICD-10-CM | POA: Diagnosis not present

## 2023-12-31 DIAGNOSIS — I739 Peripheral vascular disease, unspecified: Secondary | ICD-10-CM | POA: Diagnosis not present

## 2023-12-31 DIAGNOSIS — G4733 Obstructive sleep apnea (adult) (pediatric): Secondary | ICD-10-CM | POA: Diagnosis not present

## 2023-12-31 DIAGNOSIS — K5900 Constipation, unspecified: Secondary | ICD-10-CM | POA: Diagnosis not present

## 2023-12-31 DIAGNOSIS — M6208 Separation of muscle (nontraumatic), other site: Secondary | ICD-10-CM | POA: Diagnosis not present

## 2023-12-31 DIAGNOSIS — E1122 Type 2 diabetes mellitus with diabetic chronic kidney disease: Secondary | ICD-10-CM | POA: Diagnosis not present

## 2023-12-31 DIAGNOSIS — Z23 Encounter for immunization: Secondary | ICD-10-CM | POA: Diagnosis not present

## 2023-12-31 DIAGNOSIS — I69354 Hemiplegia and hemiparesis following cerebral infarction affecting left non-dominant side: Secondary | ICD-10-CM | POA: Diagnosis not present

## 2023-12-31 DIAGNOSIS — Z0000 Encounter for general adult medical examination without abnormal findings: Secondary | ICD-10-CM | POA: Diagnosis not present

## 2023-12-31 DIAGNOSIS — E278 Other specified disorders of adrenal gland: Secondary | ICD-10-CM | POA: Diagnosis not present

## 2024-01-19 ENCOUNTER — Encounter: Payer: Self-pay | Admitting: Internal Medicine

## 2024-02-15 ENCOUNTER — Ambulatory Visit: Admitting: Internal Medicine

## 2024-02-15 ENCOUNTER — Encounter: Payer: Self-pay | Admitting: Internal Medicine

## 2024-02-15 VITALS — BP 113/54 | HR 89 | Temp 97.5°F | Ht 61.0 in | Wt 167.4 lb

## 2024-02-15 DIAGNOSIS — K5904 Chronic idiopathic constipation: Secondary | ICD-10-CM

## 2024-02-15 DIAGNOSIS — Z7902 Long term (current) use of antithrombotics/antiplatelets: Secondary | ICD-10-CM | POA: Diagnosis not present

## 2024-02-15 DIAGNOSIS — Z860101 Personal history of adenomatous and serrated colon polyps: Secondary | ICD-10-CM | POA: Diagnosis not present

## 2024-02-15 DIAGNOSIS — K5909 Other constipation: Secondary | ICD-10-CM

## 2024-02-15 DIAGNOSIS — Z8673 Personal history of transient ischemic attack (TIA), and cerebral infarction without residual deficits: Secondary | ICD-10-CM

## 2024-02-15 DIAGNOSIS — K219 Gastro-esophageal reflux disease without esophagitis: Secondary | ICD-10-CM | POA: Diagnosis not present

## 2024-02-15 DIAGNOSIS — Z860100 Personal history of colon polyps, unspecified: Secondary | ICD-10-CM

## 2024-02-15 MED ORDER — LINACLOTIDE 290 MCG PO CAPS: 290.0000 ug | ORAL_CAPSULE | Freq: Every day | ORAL | 0 refills | Status: AC

## 2024-02-15 MED ORDER — FAMOTIDINE 40 MG PO TABS: 40.0000 mg | ORAL_TABLET | Freq: Every day | ORAL | 11 refills | Status: AC

## 2024-02-15 NOTE — Patient Instructions (Signed)
 We will schedule you for colonoscopy for surveillance purposes given your history of polyps.  For your constipation, I want you to start taking over the counter MiraLAX  1 capful daily.  If this does not adequately control your constipation, I would increase to 2 capfuls daily.  If this is still not adequate, then I would add on once daily Dulcolax (bisacodyl) tablet.   I also recommend increasing fiber in your diet or adding OTC Benefiber/Metamucil. Be sure to drink at least 6 glasses of water daily.   If this regimen does not improve your symptoms, that I want you to use the samples of Linzess  I provided today.  I will send in famotidine  to take daily for your acid reflux.  It was very nice meeting you today.  Dr. Cindie   Linzess  works best when taken once a day every day, on an empty stomach, at least 30 minutes before your first meal of the day.  When Linzess  is taken daily as directed:  *Constipation relief is typically felt in about a week *IBS-C patients may begin to experience relief from belly pain and overall abdominal symptoms (pain, discomfort, and bloating) in about 1 week,   with symptoms typically improving over 12 weeks.  Diarrhea may occur in the first 2 weeks -keep taking it.  The diarrhea should go away and you should start having normal, complete, full bowel movements. It may be helpful to start treatment when you can be near the comfort of your own bathroom, such as a weekend.

## 2024-02-15 NOTE — Progress Notes (Signed)
 "   Primary Care Physician:  Clarice Nottingham, MD Primary Gastroenterologist:  Dr. Cindie  Chief Complaint  Patient presents with   New Patient (Initial Visit)    Patient here today due to the need of a Colonoscopy. She says she has issues with constipation. She is not currently taking any medications for this. Reports she has not had any issues with this for the last week.     HPI:   Briana Garrison is a 74 y.o. female who presents to clinic today by referral from her PCP Dr. Clarice for evaluation.  History of adenomatous colon polyps: Last colonoscopy 09/06/2018 with 4 small tubular adenomas removed.  5-year recall given.  Denies any family history of colorectal malignancy.  No melena hematochezia.  No unintentional weight loss or abdominal pain.  Chronic constipation: Issue for a long time.  Sometimes will go up to 2 weeks without a BM.  Taking over-the-counter stool softeners as needed.  Also takes Metamucil on occasion.  Does note associated straining.  Denies any abdominal pain.  Chronic GERD: Previously on pantoprazole  years ago.  Not just taking Tums as needed.  Having breakthrough symptoms.  Denies any dysphagia odynophagia.  No epigastric or chest pain.  Past Medical History:  Diagnosis Date   Cancer of kidney (HCC) 05/14/2009   Diabetes mellitus    Endometrial mass    GERD (gastroesophageal reflux disease)    Hypercholesterolemia    Hypertension    Internal hemorrhoids    S/P colonoscopy 06/10/00 by Dr. Geroge   Shingles    Stroke [facility]) 09/2019   2 strokes    Past Surgical History:  Procedure Laterality Date   BIOPSY  09/06/2018   Procedure: BIOPSY;  Surgeon: Golda Claudis PENNER, MD;  Location: [facility];  Service: Endoscopy;;   CHOLECYSTECTOMY  11/13/08   COLONOSCOPY N/A 09/06/2018   Procedure: COLONOSCOPY;  Surgeon: Golda Claudis PENNER, MD;  Location: [facility];  Service: Endoscopy;  Laterality: N/A;  730   CYSTOSCOPY KIDNEY W/ URETERAL GUIDE WIRE  05/14/09   Diagnostic  hysteroscopy, D&C, endometrial polypectomy  07/01/2005   LIPECTOMY     POLYPECTOMY  09/06/2018   Procedure: POLYPECTOMY;  Surgeon: Golda Claudis PENNER, MD;  Location: [facility];  Service: Endoscopy;;    Current Outpatient Medications  Medication Sig Dispense Refill   aspirin  81 MG chewable tablet Chew 1 tablet (81 mg total) by mouth daily. (Patient taking differently: Chew 81 mg by mouth in the morning and at bedtime.) 30 tablet 2   Cholecalciferol  (VITAMIN D -3) 25 MCG (1000 UT) CAPS Take 1,000 Units by mouth daily.      butalbital-acetaminophen -caffeine (FIORICET) 50-325-40 MG tablet Take 1 tablet by mouth every 4 (four) hours as needed for headache or migraine.     carvedilol  (COREG ) 25 MG tablet Take 1 tablet (25 mg total) by mouth 2 (two) times daily. (Patient taking differently: Take 25 mg by mouth 2 (two) times daily. Take 1/2 tablet twice daily) 60 tablet 3   cloNIDine  (CATAPRES ) 0.1 MG tablet Take 0.1 mg by mouth See admin instructions. Take 1 and 1/2 tablet twice a day     clopidogrel  (PLAVIX ) 75 MG tablet Take 1 tablet (75 mg total) by mouth daily. 90 tablet 2   glucose 4 GM chewable tablet Chew 1 tablet by mouth once as needed for low blood sugar.     insulin  glargine (LANTUS ) 100 unit/mL SOPN Inject 14 Units into the skin 2 (two) times daily. 14 units  am    6 units bedtime (Patient taking differently: Inject 18 Units into the skin daily. 18 units am) 15 mL 3   MOUNJARO 15 MG/0.5ML Pen Inject 15 mg into the skin once a week.     NOVOLOG  FLEXPEN 100 UNIT/ML FlexPen Inject 11-13 Units into the skin 3 (three) times daily with meals. 13 units am, 11 units noon, and 11 units pm     oxyCODONE  (ROXICODONE ) 5 MG immediate release tablet Take 1 tablet (5 mg total) by mouth every 4 (four) hours as needed for severe pain (pain score 7-10). 5 tablet 0   oxymetazoline  (AFRIN NASAL SPRAY) 0.05 % nasal spray Place 1 spray into both nostrils 2 (two) times daily. 30 mL 0   rosuvastatin  (CRESTOR ) 40 MG  tablet Take 40 mg by mouth daily.     telmisartan (MICARDIS) 20 MG tablet Take 20 mg by mouth daily.      UBRELVY 50 MG TABS Take by mouth as needed.     vitamin B-12 (CYANOCOBALAMIN ) 1000 MCG tablet Take 1,000 mcg by mouth daily.     No current facility-administered medications for this visit.    Allergies as of 02/15/2024 - Review Complete 02/15/2024  Allergen Reaction Noted   Amlodipine  Swelling 02/05/2017   Tramadol  Diarrhea, Nausea And Vomiting, and Dermatitis 11/26/2018   Acetaminophen  Other (See Comments) 02/20/2021   Ibuprofen Other (See Comments) 02/20/2021    Family History  Problem Relation Age of Onset   Ovarian cancer Mother    Stroke Mother    Stroke Father    Stroke Brother    Stroke Sister     Social History   Socioeconomic History   Marital status: Married    Spouse name: Nancyann   Number of children: 2   Years of education: 12   Highest education level: Not on file  Occupational History   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    Employer: RETIRED  Tobacco Use   Smoking status: Never    Passive exposure: Never   Smokeless tobacco: Never  Vaping Use   Vaping status: Never Used  Substance and Sexual Activity   Alcohol use: No   Drug use: No   Sexual activity: Not on file  Other Topics Concern   Not on file  Social History Narrative   Lives with Husband and daughter   Right Handed   Drinks no caffeine daily   Social Drivers of Health   Tobacco Use: Low Risk (02/15/2024)   Patient History    Smoking Tobacco Use: Never    Smokeless Tobacco Use: Never    Passive Exposure: Never  Financial Resource Strain: Not on file  Food Insecurity: No Food Insecurity (05/28/2023)   Hunger Vital Sign    Worried About Running Out of Food in the Last Year: Never true    Ran Out of Food in the Last Year: Never true  Transportation Needs: No Transportation Needs (05/28/2023)   PRAPARE - Administrator, Civil Service  (Medical): No    Lack of Transportation (Non-Medical): No  Physical Activity: Not on file  Stress: Not on file  Social Connections: Moderately Isolated (05/28/2023)   Social Connection and Isolation Panel    Frequency of Communication with Friends and Family: Never    Frequency of Social Gatherings with Friends and Family: Never    Attends Religious Services: More than 4 times per year    Active Member of Clubs or Organizations: No  Attends Banker Meetings: Never    Marital Status: Married  Catering Manager Violence: Not At Risk (05/28/2023)   Humiliation, Afraid, Rape, and Kick questionnaire    Fear of Current or Ex-Partner: No    Emotionally Abused: No    Physically Abused: No    Sexually Abused: No  Depression (PHQ2-9): Not on file  Alcohol Screen: Not on file  Housing: Low Risk (05/28/2023)   Housing Stability Vital Sign    Unable to Pay for Housing in the Last Year: No    Number of Times Moved in the Last Year: 0    Homeless in the Last Year: No  Utilities: Not At Risk (05/28/2023)   [facility] Utilities    Threatened with loss of utilities: No  Health Literacy: Not on file    Subjective: Review of Systems  Constitutional:  Negative for chills and fever.  HENT:  Negative for congestion and hearing loss.   Eyes:  Negative for blurred vision and double vision.  Respiratory:  Negative for cough and shortness of breath.   Cardiovascular:  Negative for chest pain and palpitations.  Gastrointestinal:  Positive for constipation. Negative for abdominal pain, blood in stool, diarrhea, heartburn, melena and vomiting.  Genitourinary:  Negative for dysuria and urgency.  Musculoskeletal:  Negative for joint pain and myalgias.  Skin:  Negative for itching and rash.  Neurological:  Negative for dizziness and headaches.  Psychiatric/Behavioral:  Negative for depression. The patient is not nervous/anxious.        Objective: BP (!) 113/54 (BP Location: Left Arm, Patient Position:  Sitting, Cuff Size: Large)   Pulse 89   Temp (!) 97.5 F (36.4 C) (Temporal)   Ht 5' 1 (1.549 m)   Wt 167 lb 6.4 oz (75.9 kg)   BMI 31.63 kg/m  Physical Exam Constitutional:      Appearance: Normal appearance.  HENT:     Head: Normocephalic and atraumatic.  Eyes:     Extraocular Movements: Extraocular movements intact.     Conjunctiva/sclera: Conjunctivae normal.  Cardiovascular:     Rate and Rhythm: Normal rate and regular rhythm.  Pulmonary:     Effort: Pulmonary effort is normal.     Breath sounds: Normal breath sounds.  Abdominal:     General: Bowel sounds are normal.     Palpations: Abdomen is soft.  Musculoskeletal:        General: No swelling. Normal range of motion.     Cervical back: Normal range of motion and neck supple.  Skin:    General: Skin is warm and dry.     Coloration: Skin is not jaundiced.  Neurological:     General: No focal deficit present.     Mental Status: She is alert and oriented to person, place, and time.  Psychiatric:        Mood and Affect: Mood normal.        Behavior: Behavior normal.      Assessment/Plan:  1.  History of adenomatous colon polyps - Will schedule for surveillance colonoscopy.The risks including infection, bleed, or perforation as well as benefits, limitations, alternatives and imponderables have been reviewed with the patient. Questions have been answered. All parties agreeable.  Patient chronically on Plavix  with history of CVA x4. She would be increased risk off this medication. Will not hold this prior to colonoscopy. Counseled about increased bleeding risk and she understands.  If large lesion encountered, we may have to bring her back for a second colonoscopy off  Plavix  and she understands and is agreeable.  2.  Chronic constipation - chronic issue. Can go up to 2 weeks without a BM. Taking OTC stool softeners as needed. Takes Metamucil at times.   I recommended taking MiraLAX  1 capful daily.  If this is not  adequate then increase to 2 capfuls daily.  If this is still not adequate then add on once daily Dulcolax.  Also recommended patient start taking fiber therapy. Encouraged to drink at least 6 glasses of water daily.  If not improved on above regimen, start Linzess  290 mcg daily.  Samples provided today.  Counseled on initial washout period.  3.  Chronic GERD-start famotidine  40 mg daily.  Sent to pharmacy.  Follow-up after colonoscopy.  02/15/2024 1:28 PM    "

## 2024-02-16 ENCOUNTER — Telehealth (INDEPENDENT_AMBULATORY_CARE_PROVIDER_SITE_OTHER): Payer: Self-pay

## 2024-02-16 NOTE — Telephone Encounter (Signed)
 ATC patient to schedule TCS, no answer. Phone never went to vm, it just kept ringing and ringing.

## 2024-02-18 NOTE — Telephone Encounter (Signed)
 ATC patient to schedule TCS, no answer. LVM for call back.
